# Patient Record
Sex: Male | Born: 1945 | Race: White | Hispanic: No | Marital: Married | Smoking: Former smoker
Health system: Southern US, Community
[De-identification: ages and names within clinical notes are randomized; demographics above are authoritative.]

## PROBLEM LIST (undated history)

## (undated) DIAGNOSIS — E039 Hypothyroidism, unspecified: Secondary | ICD-10-CM

## (undated) DIAGNOSIS — G43909 Migraine, unspecified, not intractable, without status migrainosus: Secondary | ICD-10-CM

## (undated) DIAGNOSIS — K219 Gastro-esophageal reflux disease without esophagitis: Secondary | ICD-10-CM

## (undated) DIAGNOSIS — M75100 Unspecified rotator cuff tear or rupture of unspecified shoulder, not specified as traumatic: Secondary | ICD-10-CM

## (undated) DIAGNOSIS — N183 Chronic kidney disease, stage 3 unspecified: Secondary | ICD-10-CM

## (undated) DIAGNOSIS — I10 Essential (primary) hypertension: Secondary | ICD-10-CM

## (undated) DIAGNOSIS — M3500 Sicca syndrome, unspecified: Secondary | ICD-10-CM

## (undated) DIAGNOSIS — I2510 Atherosclerotic heart disease of native coronary artery without angina pectoris: Secondary | ICD-10-CM

## (undated) DIAGNOSIS — E785 Hyperlipidemia, unspecified: Secondary | ICD-10-CM

## (undated) DIAGNOSIS — Z9861 Coronary angioplasty status: Secondary | ICD-10-CM

## (undated) DIAGNOSIS — M1990 Unspecified osteoarthritis, unspecified site: Secondary | ICD-10-CM

## (undated) DIAGNOSIS — M329 Systemic lupus erythematosus, unspecified: Secondary | ICD-10-CM

## (undated) DIAGNOSIS — M109 Gout, unspecified: Secondary | ICD-10-CM

## (undated) HISTORY — DX: Migraine, unspecified, not intractable, without status migrainosus: G43.909

## (undated) HISTORY — DX: Hypothyroidism, unspecified: E03.9

## (undated) HISTORY — DX: Hyperlipidemia, unspecified: E78.5

## (undated) HISTORY — DX: Gout, unspecified: M10.9

## (undated) HISTORY — DX: Essential (primary) hypertension: I10

## (undated) HISTORY — PX: HERNIA REPAIR: SHX51

## (undated) HISTORY — DX: Chronic kidney disease, stage 3 unspecified: N18.30

## (undated) HISTORY — DX: Systemic lupus erythematosus, unspecified: M32.9

## (undated) HISTORY — DX: Gastro-esophageal reflux disease without esophagitis: K21.9

## (undated) HISTORY — DX: Sjogren syndrome, unspecified: M35.00

## (undated) HISTORY — DX: Unspecified rotator cuff tear or rupture of unspecified shoulder, not specified as traumatic: M75.100

## (undated) HISTORY — DX: Unspecified osteoarthritis, unspecified site: M19.90

---

## 1995-05-20 HISTORY — PX: THYROID SURGERY: SHX805

## 2014-02-27 ENCOUNTER — Other Ambulatory Visit: Payer: Self-pay | Admitting: Nephrology

## 2014-02-27 DIAGNOSIS — N183 Chronic kidney disease, stage 3 unspecified: Secondary | ICD-10-CM

## 2014-03-06 ENCOUNTER — Other Ambulatory Visit: Payer: Self-pay

## 2014-03-10 ENCOUNTER — Ambulatory Visit
Admission: RE | Admit: 2014-03-10 | Discharge: 2014-03-10 | Disposition: A | Discharge status: Home / Self Care | Payer: Commercial Managed Care - HMO | Source: Ambulatory Visit | Attending: Nephrology | Admitting: Nephrology

## 2014-03-10 DIAGNOSIS — N183 Chronic kidney disease, stage 3 unspecified: Secondary | ICD-10-CM

## 2015-05-20 HISTORY — PX: CATARACT EXTRACTION, BILATERAL: SHX1313

## 2015-05-25 DIAGNOSIS — Z0000 Encounter for general adult medical examination without abnormal findings: Secondary | ICD-10-CM | POA: Diagnosis not present

## 2015-06-06 DIAGNOSIS — L300 Nummular dermatitis: Secondary | ICD-10-CM | POA: Diagnosis not present

## 2015-06-06 DIAGNOSIS — L820 Inflamed seborrheic keratosis: Secondary | ICD-10-CM | POA: Diagnosis not present

## 2015-06-27 DIAGNOSIS — Z79899 Other long term (current) drug therapy: Secondary | ICD-10-CM | POA: Diagnosis not present

## 2015-06-29 DIAGNOSIS — M3509 Sicca syndrome with other organ involvement: Secondary | ICD-10-CM | POA: Diagnosis not present

## 2015-06-29 DIAGNOSIS — M1A00X0 Idiopathic chronic gout, unspecified site, without tophus (tophi): Secondary | ICD-10-CM | POA: Diagnosis not present

## 2015-06-29 DIAGNOSIS — L932 Other local lupus erythematosus: Secondary | ICD-10-CM | POA: Diagnosis not present

## 2015-06-29 DIAGNOSIS — Z09 Encounter for follow-up examination after completed treatment for conditions other than malignant neoplasm: Secondary | ICD-10-CM | POA: Diagnosis not present

## 2015-07-06 DIAGNOSIS — N183 Chronic kidney disease, stage 3 (moderate): Secondary | ICD-10-CM | POA: Diagnosis not present

## 2015-07-13 DIAGNOSIS — I10 Essential (primary) hypertension: Secondary | ICD-10-CM | POA: Diagnosis not present

## 2015-07-13 DIAGNOSIS — M3500 Sicca syndrome, unspecified: Secondary | ICD-10-CM | POA: Diagnosis not present

## 2015-07-13 DIAGNOSIS — N183 Chronic kidney disease, stage 3 (moderate): Secondary | ICD-10-CM | POA: Diagnosis not present

## 2015-08-03 DIAGNOSIS — D3132 Benign neoplasm of left choroid: Secondary | ICD-10-CM | POA: Diagnosis not present

## 2015-08-03 DIAGNOSIS — Z79899 Other long term (current) drug therapy: Secondary | ICD-10-CM | POA: Diagnosis not present

## 2015-08-03 DIAGNOSIS — L930 Discoid lupus erythematosus: Secondary | ICD-10-CM | POA: Diagnosis not present

## 2015-08-03 DIAGNOSIS — H259 Unspecified age-related cataract: Secondary | ICD-10-CM | POA: Diagnosis not present

## 2015-09-14 DIAGNOSIS — Z79899 Other long term (current) drug therapy: Secondary | ICD-10-CM | POA: Diagnosis not present

## 2015-09-21 DIAGNOSIS — L300 Nummular dermatitis: Secondary | ICD-10-CM | POA: Diagnosis not present

## 2015-09-21 DIAGNOSIS — L820 Inflamed seborrheic keratosis: Secondary | ICD-10-CM | POA: Diagnosis not present

## 2015-09-21 DIAGNOSIS — D485 Neoplasm of uncertain behavior of skin: Secondary | ICD-10-CM | POA: Diagnosis not present

## 2015-11-16 DIAGNOSIS — R300 Dysuria: Secondary | ICD-10-CM | POA: Diagnosis not present

## 2015-11-16 DIAGNOSIS — Z79899 Other long term (current) drug therapy: Secondary | ICD-10-CM | POA: Diagnosis not present

## 2015-11-16 DIAGNOSIS — R5381 Other malaise: Secondary | ICD-10-CM | POA: Diagnosis not present

## 2015-11-19 DIAGNOSIS — L570 Actinic keratosis: Secondary | ICD-10-CM | POA: Diagnosis not present

## 2015-11-22 DIAGNOSIS — M5136 Other intervertebral disc degeneration, lumbar region: Secondary | ICD-10-CM | POA: Diagnosis not present

## 2015-11-23 DIAGNOSIS — M545 Low back pain: Secondary | ICD-10-CM | POA: Diagnosis not present

## 2015-11-23 DIAGNOSIS — L932 Other local lupus erythematosus: Secondary | ICD-10-CM | POA: Diagnosis not present

## 2015-11-23 DIAGNOSIS — M1A00X0 Idiopathic chronic gout, unspecified site, without tophus (tophi): Secondary | ICD-10-CM | POA: Diagnosis not present

## 2015-11-23 DIAGNOSIS — M3509 Sicca syndrome with other organ involvement: Secondary | ICD-10-CM | POA: Diagnosis not present

## 2015-12-28 DIAGNOSIS — H2513 Age-related nuclear cataract, bilateral: Secondary | ICD-10-CM | POA: Diagnosis not present

## 2015-12-28 DIAGNOSIS — H2511 Age-related nuclear cataract, right eye: Secondary | ICD-10-CM | POA: Diagnosis not present

## 2015-12-28 DIAGNOSIS — H1859 Other hereditary corneal dystrophies: Secondary | ICD-10-CM | POA: Diagnosis not present

## 2015-12-28 DIAGNOSIS — H18413 Arcus senilis, bilateral: Secondary | ICD-10-CM | POA: Diagnosis not present

## 2016-02-01 DIAGNOSIS — H2512 Age-related nuclear cataract, left eye: Secondary | ICD-10-CM | POA: Diagnosis not present

## 2016-02-01 DIAGNOSIS — H2511 Age-related nuclear cataract, right eye: Secondary | ICD-10-CM | POA: Diagnosis not present

## 2016-02-22 DIAGNOSIS — H2512 Age-related nuclear cataract, left eye: Secondary | ICD-10-CM | POA: Diagnosis not present

## 2016-03-14 DIAGNOSIS — I129 Hypertensive chronic kidney disease with stage 1 through stage 4 chronic kidney disease, or unspecified chronic kidney disease: Secondary | ICD-10-CM | POA: Diagnosis not present

## 2016-03-14 DIAGNOSIS — E291 Testicular hypofunction: Secondary | ICD-10-CM | POA: Diagnosis not present

## 2016-03-14 DIAGNOSIS — E782 Mixed hyperlipidemia: Secondary | ICD-10-CM | POA: Diagnosis not present

## 2016-03-14 DIAGNOSIS — N183 Chronic kidney disease, stage 3 (moderate): Secondary | ICD-10-CM | POA: Diagnosis not present

## 2016-03-14 DIAGNOSIS — M3500 Sicca syndrome, unspecified: Secondary | ICD-10-CM | POA: Diagnosis not present

## 2016-03-14 DIAGNOSIS — Z0000 Encounter for general adult medical examination without abnormal findings: Secondary | ICD-10-CM | POA: Diagnosis not present

## 2016-03-14 DIAGNOSIS — I10 Essential (primary) hypertension: Secondary | ICD-10-CM | POA: Diagnosis not present

## 2016-03-14 DIAGNOSIS — Z23 Encounter for immunization: Secondary | ICD-10-CM | POA: Diagnosis not present

## 2016-03-14 DIAGNOSIS — M109 Gout, unspecified: Secondary | ICD-10-CM | POA: Diagnosis not present

## 2016-03-14 DIAGNOSIS — Z87448 Personal history of other diseases of urinary system: Secondary | ICD-10-CM | POA: Diagnosis not present

## 2016-03-14 LAB — BASIC METABOLIC PANEL
BUN: 25 — AB (ref 4–21)
CREATININE: 1.4 — AB (ref 0.6–1.3)
Glucose: 79
Potassium: 4.5 (ref 3.4–5.3)
SODIUM: 142 (ref 137–147)

## 2016-03-14 LAB — HEPATIC FUNCTION PANEL
ALT: 20 (ref 10–40)
AST: 26 (ref 14–40)
Alkaline Phosphatase: 75 (ref 25–125)
BILIRUBIN, TOTAL: 0.3

## 2016-03-14 LAB — LIPID PANEL
Cholesterol: 208 — AB (ref 0–200)
HDL: 47 (ref 35–70)
LDL Cholesterol: 138
TRIGLYCERIDES: 143 (ref 40–160)

## 2016-03-14 LAB — TSH: TSH: 3.82 (ref 0.41–5.90)

## 2016-03-14 LAB — PSA: PSA: 2.52

## 2016-03-28 DIAGNOSIS — Z0000 Encounter for general adult medical examination without abnormal findings: Secondary | ICD-10-CM | POA: Diagnosis not present

## 2016-03-28 DIAGNOSIS — M109 Gout, unspecified: Secondary | ICD-10-CM | POA: Diagnosis not present

## 2016-03-28 DIAGNOSIS — E291 Testicular hypofunction: Secondary | ICD-10-CM | POA: Diagnosis not present

## 2016-03-28 DIAGNOSIS — I10 Essential (primary) hypertension: Secondary | ICD-10-CM | POA: Diagnosis not present

## 2016-04-09 ENCOUNTER — Other Ambulatory Visit: Payer: Self-pay | Admitting: Rheumatology

## 2016-04-09 NOTE — Telephone Encounter (Signed)
11/23/15 last visit  11/16/15 labs WNL Next visit 04/25/16 Ok to refill per Dr Estanislado Pandy

## 2016-04-21 ENCOUNTER — Other Ambulatory Visit: Payer: Self-pay | Admitting: *Deleted

## 2016-04-21 ENCOUNTER — Telehealth: Payer: Self-pay | Admitting: Rheumatology

## 2016-04-21 DIAGNOSIS — Z79899 Other long term (current) drug therapy: Secondary | ICD-10-CM

## 2016-04-22 DIAGNOSIS — Z79899 Other long term (current) drug therapy: Secondary | ICD-10-CM | POA: Diagnosis not present

## 2016-04-22 NOTE — Telephone Encounter (Signed)
Opened in error

## 2016-04-23 DIAGNOSIS — M5136 Other intervertebral disc degeneration, lumbar region: Secondary | ICD-10-CM | POA: Insufficient documentation

## 2016-04-23 DIAGNOSIS — N289 Disorder of kidney and ureter, unspecified: Secondary | ICD-10-CM | POA: Insufficient documentation

## 2016-04-23 DIAGNOSIS — M19042 Primary osteoarthritis, left hand: Secondary | ICD-10-CM

## 2016-04-23 DIAGNOSIS — IMO0002 Reserved for concepts with insufficient information to code with codable children: Secondary | ICD-10-CM | POA: Insufficient documentation

## 2016-04-23 DIAGNOSIS — M19041 Primary osteoarthritis, right hand: Secondary | ICD-10-CM | POA: Insufficient documentation

## 2016-04-23 DIAGNOSIS — M3500 Sicca syndrome, unspecified: Secondary | ICD-10-CM | POA: Insufficient documentation

## 2016-04-23 DIAGNOSIS — M329 Systemic lupus erythematosus, unspecified: Secondary | ICD-10-CM | POA: Insufficient documentation

## 2016-04-23 DIAGNOSIS — M1A00X0 Idiopathic chronic gout, unspecified site, without tophus (tophi): Secondary | ICD-10-CM | POA: Insufficient documentation

## 2016-04-23 DIAGNOSIS — M51369 Other intervertebral disc degeneration, lumbar region without mention of lumbar back pain or lower extremity pain: Secondary | ICD-10-CM | POA: Insufficient documentation

## 2016-04-23 DIAGNOSIS — Z79899 Other long term (current) drug therapy: Secondary | ICD-10-CM | POA: Insufficient documentation

## 2016-04-23 LAB — COMPREHENSIVE METABOLIC PANEL
ALBUMIN: 4.6 g/dL (ref 3.5–4.8)
ALK PHOS: 81 IU/L (ref 39–117)
ALT: 29 IU/L (ref 0–44)
AST: 27 IU/L (ref 0–40)
Albumin/Globulin Ratio: 1.4 (ref 1.2–2.2)
BUN / CREAT RATIO: 16 (ref 10–24)
BUN: 23 mg/dL (ref 8–27)
Bilirubin Total: 0.2 mg/dL (ref 0.0–1.2)
CALCIUM: 9.1 mg/dL (ref 8.6–10.2)
CO2: 29 mmol/L (ref 18–29)
CREATININE: 1.45 mg/dL — AB (ref 0.76–1.27)
Chloride: 98 mmol/L (ref 96–106)
GFR calc Af Amer: 56 mL/min/{1.73_m2} — ABNORMAL LOW (ref >59)
GFR, EST NON AFRICAN AMERICAN: 48 mL/min/{1.73_m2} — AB (ref >59)
GLOBULIN, TOTAL: 3.4 g/dL (ref 1.5–4.5)
GLUCOSE: 86 mg/dL (ref 65–99)
Potassium: 5 mmol/L (ref 3.5–5.2)
SODIUM: 143 mmol/L (ref 134–144)
Total Protein: 8 g/dL (ref 6.0–8.5)

## 2016-04-23 LAB — CBC WITH DIFFERENTIAL/PLATELET
BASOS ABS: 0 10*3/uL (ref 0.0–0.2)
Basos: 1 %
EOS (ABSOLUTE): 0.2 10*3/uL (ref 0.0–0.4)
EOS: 3 %
HEMATOCRIT: 47.5 % (ref 37.5–51.0)
HEMOGLOBIN: 16.2 g/dL (ref 13.0–17.7)
IMMATURE GRANS (ABS): 0 10*3/uL (ref 0.0–0.1)
IMMATURE GRANULOCYTES: 0 %
LYMPHS ABS: 1.5 10*3/uL (ref 0.7–3.1)
LYMPHS: 23 %
MCH: 31.2 pg (ref 26.6–33.0)
MCHC: 34.1 g/dL (ref 31.5–35.7)
MCV: 92 fL (ref 79–97)
MONOCYTES: 9 %
Monocytes Absolute: 0.6 10*3/uL (ref 0.1–0.9)
NEUTROS PCT: 64 %
Neutrophils Absolute: 4.2 10*3/uL (ref 1.4–7.0)
Platelets: 240 10*3/uL (ref 150–379)
RBC: 5.19 x10E6/uL (ref 4.14–5.80)
RDW: 14.8 % (ref 12.3–15.4)
WBC: 6.5 10*3/uL (ref 3.4–10.8)

## 2016-04-23 NOTE — Progress Notes (Signed)
Office Visit Note  Patient: Dennis Moon             Date of Birth: 1946/02/14           MRN: [id number]             PCP: Adline Mango, MD Referring: No ref. provider found Visit Date: 04/25/2016 Occupation: @GUAROCC @    Subjective:  No chief complaint on file. Follow-up on Sjogren's, cutaneous lupus, gout, chronic kidney disease, high risk prescription  History of Present Illness: Dennis Moon is a 70 y.o. male  Last seen in our office on 11/23/2015 Patient is doing well with everything. No Sjogren's flare. Has ongoing dry eyes and dry mouth with minimal discomfort.  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 He sees his dermatologist on a regular basis for this as well as general skin check.  Flares since the last visit. Takes allopurinol 30 mg one half tablet daily. His last uric acid was 4.6 back in 11/16/2015.  Sees Dr. Joelyn Oms chronic renal insufficiency. The last visit, patient was told that his kidney functions are stable.  Takes Plaquenil on a regular basis at 200 mg twice a day Monday through Friday. His Plaquenil eye exam was normal April 2017 and will be due again annually.  Needs refill on allopurinol and Plaquenil.  Take Cymbalta for joint pain. Prescribed by his PCP.  Recently had bilateral cataract surgery September 2017 is doing well with that.   Activities of Daily Living:  Patient reports morning stiffness for 15 minutes.   Patient Denies nocturnal pain.  Difficulty dressing/grooming: Denies Difficulty climbing stairs: Denies Difficulty getting out of chair: Denies Difficulty using hands for taps, buttons, cutlery, and/or writing: Reports   Review of Systems  Constitutional: Negative for fatigue.  HENT: Negative for mouth sores and mouth dryness.   Eyes: Negative for dryness.  Respiratory: Negative for shortness of breath.   Gastrointestinal: Negative for constipation and diarrhea.  Musculoskeletal: Negative for myalgias and myalgias.  Skin:  Negative for sensitivity to sunlight.  Neurological: Negative for memory loss.  Psychiatric/Behavioral: Negative for sleep disturbance.    PMFS History:  Patient Active Problem List   Diagnosis Date Noted  . Sjogren's syndrome ([place]) 04/23/2016  . 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 Idiopathic chronic gout, unspecified site, without tophus (tophi) 04/23/2016  . High risk medication use 04/23/2016  . Osteoarthritis, hand 04/23/2016  . DDD (degenerative disc disease), lumbar 04/23/2016  . Kidney disease 04/23/2016    Past Medical History:  Diagnosis Date  . Gout   . Hypertension   . Hypothyroidism   . Migraine   . Sjogren's disease ([place])   . Systemic lupus erythematosus (HCC)     Family History  Problem Relation Age of Onset  . Diabetes Mother   . Hypertension Mother   . Hypertension Father    Past Surgical History:  Procedure Laterality Date  . CATARACT EXTRACTION, BILATERAL Bilateral 2017  . HERNIA REPAIR    . THYROID SURGERY  1997   Social History   Social History Narrative  . No narrative on file     Objective: Vital Signs: BP (!) 149/77 (BP Location: Left Arm, Patient Position: Sitting, Cuff Size: Normal)   Pulse 71   Resp 14   Ht 5' 11.5" (1.816 m)   Wt 228 lb (103.4 kg)   BMI 31.36 kg/m    Physical Exam  Constitutional: He is oriented to person, place, and time. He appears well-developed and well-nourished.  HENT:  Head: Normocephalic and  atraumatic.  Eyes: Conjunctivae and EOM are normal. Pupils are equal, round, and reactive to light.  Neck: Normal range of motion. Neck supple.  Cardiovascular: Normal rate, regular rhythm and normal heart sounds.  Exam reveals no gallop and no friction rub.   No murmur heard. Pulmonary/Chest: Effort normal and breath sounds normal. No respiratory distress. He has no wheezes. He has no rales. He exhibits no tenderness.  Abdominal: Soft. He exhibits no distension and no mass. There is no tenderness. There is no guarding.    Musculoskeletal: Normal range of motion.  Lymphadenopathy:    He has no cervical adenopathy.  Neurological: He is alert and oriented to person, place, and time. He exhibits normal muscle tone. Coordination normal.  Skin: Skin is warm and dry. Capillary refill takes less than 2 seconds. No rash noted.  Psychiatric: He has a normal mood and affect. His behavior is normal. Judgment and thought content normal.  Nursing note and vitals reviewed.    Musculoskeletal Exam:  Full range of motion of all joints Grip strength is equal and strong bilaterally For myalgia tender points are all absent  CDAI Exam: No CDAI exam completed.   No synovitis on examination Investigation: Findings:  09/14/2015 normal plaquenil eye exam 11/16/2015 CBC CMP consistent with previous labs Creat 1.36 GFR 52  Patient recently had labs done on 04/23/2016. CMP with GFR is normal except for creatinine at 1.45 and GFR 48. These were done at [facility]. Please find those in Epic. CBC with differential is normal. Will return to clinic in 18 weeks for follow-up visit and we will draw labs at that time which will include CBC with differential CMP with GFR uric acid and urinalysis. Patient understands and is agreeable.  Imaging: No results found.  Speciality Comments: No specialty comments available.    Procedures:  No procedures performed Allergies: Patient has no known allergies.   Assessment / Plan:     Visit Diagnoses: Chronic kidney disease, unspecified CKD stage - Plan: COMPLETE METABOLIC PANEL WITH GFR  Sjogren's syndrome with keratoconjunctivitis sicca (HCC) - Positive ANA; positive Ro; positive La;  Subacute cutaneous lupus erythematosus  Idiopathic chronic gout of multiple sites without tophus - allopurionol - Plan: Uric acid  High risk medication use - PLQ 200 BID M-F; PLQ EYE NORMAL 08/2015 ;  - Plan: CBC with Differential/Platelet, COMPLETE METABOLIC PANEL WITH GFR, Uric acid, Urinalysis, Routine  w reflex microscopic  Primary osteoarthritis of both hands  DDD (degenerative disc disease), lumbar  Kidney disease - Plan: COMPLETE METABOLIC PANEL WITH GFR   Chronic kidney disease   Orders: Orders Placed This Encounter  Procedures  . CBC with Differential/Platelet  . COMPLETE METABOLIC PANEL WITH GFR  . Uric acid  . Urinalysis, Routine w reflex microscopic   Meds ordered this encounter  Medications  . hydroxychloroquine (PLAQUENIL) 200 MG tablet    Sig: Take 1 tablet (200 mg total) by mouth 2 (two) times daily.    Dispense:  180 tablet    Refill:  1    Order Specific Question:   Supervising Provider    Answer:   Bo Merino [[id number]]  . allopurinol (ZYLOPRIM) 300 MG tablet    Sig: Take 1.5 tablets (450 mg total) by mouth daily.    Dispense:  135 tablet    Refill:  1    Order Specific Question:   Supervising Provider    Answer:   Bo Merino [phone]    Face-to-face time spent with patient was 30  minutes. 50% of time was spent in counseling and coordination of care.  Follow-Up Instructions: Return in about 18 weeks (around 08/29/2016) for sjog, cutan lupus, ckd - stable, plq 200 bid m-f;.   Malayah Demuro, PA-C   I examined and evaluated the patient with Eliezer Lofts PA. The plan of care was discussed as noted above.  Bo Merino, MD

## 2016-04-23 NOTE — Progress Notes (Signed)
Labs are stable, GFR slightly decreased. Please fax results to PCP

## 2016-04-25 ENCOUNTER — Ambulatory Visit (INDEPENDENT_AMBULATORY_CARE_PROVIDER_SITE_OTHER): Payer: PPO | Admitting: Rheumatology

## 2016-04-25 ENCOUNTER — Encounter: Payer: Self-pay | Admitting: Rheumatology

## 2016-04-25 ENCOUNTER — Ambulatory Visit: Payer: Self-pay | Admitting: Rheumatology

## 2016-04-25 VITALS — BP 149/77 | HR 71 | Resp 14 | Ht 71.5 in | Wt 228.0 lb

## 2016-04-25 DIAGNOSIS — L931 Subacute cutaneous lupus erythematosus: Secondary | ICD-10-CM

## 2016-04-25 DIAGNOSIS — N289 Disorder of kidney and ureter, unspecified: Secondary | ICD-10-CM

## 2016-04-25 DIAGNOSIS — M5136 Other intervertebral disc degeneration, lumbar region: Secondary | ICD-10-CM | POA: Diagnosis not present

## 2016-04-25 DIAGNOSIS — M3501 Sicca syndrome with keratoconjunctivitis: Secondary | ICD-10-CM

## 2016-04-25 DIAGNOSIS — N189 Chronic kidney disease, unspecified: Secondary | ICD-10-CM

## 2016-04-25 DIAGNOSIS — Z79899 Other long term (current) drug therapy: Secondary | ICD-10-CM | POA: Diagnosis not present

## 2016-04-25 DIAGNOSIS — M19042 Primary osteoarthritis, left hand: Secondary | ICD-10-CM | POA: Diagnosis not present

## 2016-04-25 DIAGNOSIS — M51369 Other intervertebral disc degeneration, lumbar region without mention of lumbar back pain or lower extremity pain: Secondary | ICD-10-CM

## 2016-04-25 DIAGNOSIS — M1A09X0 Idiopathic chronic gout, multiple sites, without tophus (tophi): Secondary | ICD-10-CM

## 2016-04-25 DIAGNOSIS — M19041 Primary osteoarthritis, right hand: Secondary | ICD-10-CM | POA: Diagnosis not present

## 2016-04-25 MED ORDER — ALLOPURINOL 300 MG PO TABS: 450.0000 mg | ORAL_TABLET | Freq: Every day | ORAL | 1 refills | Status: DC

## 2016-04-25 MED ORDER — HYDROXYCHLOROQUINE SULFATE 200 MG PO TABS: 200.0000 mg | ORAL_TABLET | Freq: Two times a day (BID) | ORAL | 1 refills | Status: DC

## 2016-06-20 DIAGNOSIS — N183 Chronic kidney disease, stage 3 (moderate): Secondary | ICD-10-CM | POA: Diagnosis not present

## 2016-06-27 DIAGNOSIS — I10 Essential (primary) hypertension: Secondary | ICD-10-CM | POA: Diagnosis not present

## 2016-06-27 DIAGNOSIS — N183 Chronic kidney disease, stage 3 (moderate): Secondary | ICD-10-CM | POA: Diagnosis not present

## 2016-06-27 DIAGNOSIS — Z6832 Body mass index (BMI) 32.0-32.9, adult: Secondary | ICD-10-CM | POA: Diagnosis not present

## 2016-08-26 DIAGNOSIS — N189 Chronic kidney disease, unspecified: Secondary | ICD-10-CM | POA: Insufficient documentation

## 2016-08-26 DIAGNOSIS — M109 Gout, unspecified: Secondary | ICD-10-CM | POA: Insufficient documentation

## 2016-08-26 NOTE — Progress Notes (Deleted)
Office Visit Note  Patient: Dennis Moon             Date of Birth: 06/02/1945           MRN: [id number]             PCP: Adline Mango, MD Referring: Karleen Hampshire., MD Visit Date: 08/29/2016 Occupation: @GUAROCC @    Subjective:  No chief complaint on file.   History of Present Illness: Dennis Moon is a 71 y.o. male ***   Activities of Daily Living:  Patient reports morning stiffness for *** {minute/hour:19697}.   Patient {ACTIONS;DENIES/REPORTS:21021675::"Denies"} nocturnal pain.  Difficulty dressing/grooming: {ACTIONS;DENIES/REPORTS:21021675::"Denies"} Difficulty climbing stairs: {ACTIONS;DENIES/REPORTS:21021675::"Denies"} Difficulty getting out of chair: {ACTIONS;DENIES/REPORTS:21021675::"Denies"} Difficulty using hands for taps, buttons, cutlery, and/or writing: {ACTIONS;DENIES/REPORTS:21021675::"Denies"}   No Rheumatology ROS completed.   PMFS History:  Patient Active Problem List   Diagnosis Date Noted  . Sjogren's syndrome ([place]) 04/23/2016  . 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 Idiopathic chronic gout, unspecified site, without tophus (tophi) 04/23/2016  . High risk medication use 04/23/2016  . Osteoarthritis, hand 04/23/2016  . DDD (degenerative disc disease), lumbar 04/23/2016  . Kidney disease 04/23/2016    Past Medical History:  Diagnosis Date  . Gout   . Hypertension   . Hypothyroidism   . Migraine   . Sjogren's disease ([place])   . Systemic lupus erythematosus (HCC)     Family History  Problem Relation Age of Onset  . Diabetes Mother   . Hypertension Mother   . Hypertension Father    Past Surgical History:  Procedure Laterality Date  . CATARACT EXTRACTION, BILATERAL Bilateral 2017  . HERNIA REPAIR    . THYROID SURGERY  1997   Social History   Social History Narrative  . No narrative on file     Objective: Vital Signs: There were no vitals taken for this visit.   Physical Exam   Musculoskeletal Exam: ***  CDAI Exam: No CDAI exam  completed.    Investigation: Findings:  09/14/2015 normal plaquenil eye exam 11/16/2015 CBC CMP consistent with previous labs Creat 1.36 GFR 52  Patient recently had labs done on 04/23/2016. CMP with GFR is normal except for creatinine at 1.45 and GFR 48. These were done at [facility]. Please find those in Epic. CBC with differential is normal. Will return to clinic in 18 weeks for follow-up visit and we will draw labs at that time which will include CBC with differential CMP with GFR uric acid and urinalysis.  Lab on 04/21/2016  Component Date Value Ref Range Status  . WBC 04/22/2016 6.5  3.4 - 10.8 x10E3/uL Final  . RBC 04/22/2016 5.19  4.14 - 5.80 x10E6/uL Final  . Hemoglobin 04/22/2016 16.2  13.0 - 17.7 g/dL Final  . Hematocrit 04/22/2016 47.5  37.5 - 51.0 % Final  . MCV 04/22/2016 92  79 - 97 fL Final  . MCH 04/22/2016 31.2  26.6 - 33.0 pg Final  . MCHC 04/22/2016 34.1  31.5 - 35.7 g/dL Final  . RDW 04/22/2016 14.8  12.3 - 15.4 % Final  . Platelets 04/22/2016 240  150 - 379 x10E3/uL Final  . Neutrophils 04/22/2016 64  Not Estab. % Final  . Lymphs 04/22/2016 23  Not Estab. % Final  . Monocytes 04/22/2016 9  Not Estab. % Final  . Eos 04/22/2016 3  Not Estab. % Final  . Basos 04/22/2016 1  Not Estab. % Final  . Neutrophils Absolute 04/22/2016 4.2  1.4 - 7.0 x10E3/uL Final  . Lymphocytes  Absolute 04/22/2016 1.5  0.7 - 3.1 x10E3/uL Final  . Monocytes Absolute 04/22/2016 0.6  0.1 - 0.9 x10E3/uL Final  . EOS (ABSOLUTE) 04/22/2016 0.2  0.0 - 0.4 x10E3/uL Final  . Basophils Absolute 04/22/2016 0.0  0.0 - 0.2 x10E3/uL Final  . Immature Granulocytes 04/22/2016 0  Not Estab. % Final  . Immature Grans (Abs) 04/22/2016 0.0  0.0 - 0.1 x10E3/uL Final  . Glucose 04/22/2016 86  65 - 99 mg/dL Final  . BUN 04/22/2016 23  8 - 27 mg/dL Final  . Creatinine, Ser 04/22/2016 1.45* 0.76 - 1.27 mg/dL Final  . GFR calc non Af Amer 04/22/2016 48* >59 mL/min/1.73 Final  . GFR calc Af Amer 04/22/2016 56*  >59 mL/min/1.73 Final  . BUN/Creatinine Ratio 04/22/2016 16  10 - 24 Final  . Sodium 04/22/2016 143  134 - 144 mmol/L Final  . Potassium 04/22/2016 5.0  3.5 - 5.2 mmol/L Final  . Chloride 04/22/2016 98  96 - 106 mmol/L Final  . CO2 04/22/2016 29  18 - 29 mmol/L Final  . Calcium 04/22/2016 9.1  8.6 - 10.2 mg/dL Final  . Total Protein 04/22/2016 8.0  6.0 - 8.5 g/dL Final  . Albumin 04/22/2016 4.6  3.5 - 4.8 g/dL Final  . Globulin, Total 04/22/2016 3.4  1.5 - 4.5 g/dL Final  . Albumin/Globulin Ratio 04/22/2016 1.4  1.2 - 2.2 Final  . Bilirubin Total 04/22/2016 0.2  0.0 - 1.2 mg/dL Final  . Alkaline Phosphatase 04/22/2016 81  39 - 117 IU/L Final  . AST 04/22/2016 27  0 - 40 IU/L Final  . ALT 04/22/2016 29  0 - 44 IU/L Final      Imaging: No results found.  Speciality Comments: No specialty comments available.    Procedures:  No procedures performed Allergies: Patient has no known allergies.   Assessment / Plan:     Visit Diagnoses: No diagnosis found.    Orders: No orders of the defined types were placed in this encounter.  No orders of the defined types were placed in this encounter.   Face-to-face time spent with patient was *** minutes. 50% of time was spent in counseling and coordination of care.  Follow-Up Instructions: No Follow-up on file.   Sunaina Ferrando, [place]  Note - This record has been created using [employer].  Chart creation errors have been sought, but may not always  have been located. Such creation errors do not reflect on  the standard of medical care.

## 2016-08-29 ENCOUNTER — Ambulatory Visit: Payer: PPO | Admitting: Rheumatology

## 2016-09-30 ENCOUNTER — Telehealth: Payer: Self-pay | Admitting: Rheumatology

## 2016-09-30 DIAGNOSIS — M2550 Pain in unspecified joint: Secondary | ICD-10-CM

## 2016-09-30 DIAGNOSIS — Z79899 Other long term (current) drug therapy: Secondary | ICD-10-CM

## 2016-09-30 DIAGNOSIS — R5381 Other malaise: Secondary | ICD-10-CM

## 2016-09-30 DIAGNOSIS — Z1159 Encounter for screening for other viral diseases: Secondary | ICD-10-CM

## 2016-09-30 DIAGNOSIS — R300 Dysuria: Secondary | ICD-10-CM

## 2016-09-30 NOTE — Telephone Encounter (Signed)
Patient is due for labd. Patient is also requesting that Hep C and testosterone levels be added to the lab order. Can we add these labs to his order?

## 2016-09-30 NOTE — Telephone Encounter (Signed)
ok 

## 2016-09-30 NOTE — Telephone Encounter (Signed)
Patient's wife is requesting lab orders be released for [facility].  Patient is also requesting that Hep C and testosterone levels be added to the lab order.   Please call patient's wife and let her know if this can be done. Cb# [phone]

## 2016-10-01 NOTE — Telephone Encounter (Signed)
Orders placed and faxed to the [facility] in [place]. Patient's wife advised we would be adding labs.

## 2016-10-02 NOTE — Progress Notes (Signed)
Office Visit Note  Patient: Dennis Moon             Date of Birth: 04-25-1946           MRN: [id number]             PCP: Karleen Hampshire., MD Referring: Karleen Hampshire., MD Visit Date: 10/10/2016 Occupation: @GUAROCC @    Subjective:  Medication Management   History of Present Illness: Dennis Moon is a 71 y.o. male with history of gout and Sjogren's. He states he has not had gout flare in a long time. He has not had recurrence of the rash from subcutaneous lupus. His sicca symptoms are tolerable with over-the-counter medications. She's been taking Plaquenil without any problems. He has some stiffness in his hands which is tolerable. Lower back pain is not a concern currently.   Activities of Daily Living:  Patient reports morning stiffness for 5 minutes.   Patient Denies nocturnal pain.  Difficulty dressing/grooming: Denies Difficulty climbing stairs: Denies Difficulty getting out of chair: Denies Difficulty using hands for taps, buttons, cutlery, and/or writing: Denies   Review of Systems  Constitutional: Negative for fatigue, night sweats and weakness ( ).  HENT: Positive for mouth dryness. Negative for mouth sores and nose dryness.   Eyes: Positive for dryness. Negative for redness.  Respiratory: Negative for shortness of breath and difficulty breathing.   Cardiovascular: Negative for chest pain, palpitations, hypertension, irregular heartbeat and swelling in legs/feet.  Gastrointestinal: Negative for constipation and diarrhea.  Endocrine: Negative for increased urination.  Musculoskeletal: Positive for arthralgias, joint pain and morning stiffness. Negative for joint swelling, myalgias, muscle weakness, muscle tenderness and myalgias.  Skin: Positive for color change. Negative for rash, hair loss, nodules/bumps, skin tightness, ulcers and sensitivity to sunlight.  Allergic/Immunologic: Negative for susceptible to infections.  Neurological: Negative for dizziness, fainting,  memory loss and night sweats.  Hematological: Negative for swollen glands.  Psychiatric/Behavioral: Negative for depressed mood and sleep disturbance. The patient is not nervous/anxious.     PMFS History:  Patient Active Problem List   Diagnosis Date Noted  . Gout of multiple sites 08/26/2016  . Chronic renal impairment 08/26/2016  . Chronic kidney disease 08/26/2016  . Sjogren's syndrome ([place]) 04/23/2016  . 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 Idiopathic chronic gout, unspecified site, without tophus (tophi) 04/23/2016  . High risk medication use 04/23/2016  . Primary osteoarthritis of both hands 04/23/2016  . DDD (degenerative disc disease), lumbar 04/23/2016  . Kidney disease 04/23/2016    Past Medical History:  Diagnosis Date  . Gout   . Hypertension   . Hypothyroidism   . Migraine   . Sjogren's disease ([place])   . Systemic lupus erythematosus (HCC)     Family History  Problem Relation Age of Onset  . Diabetes Mother   . Hypertension Mother   . Hypertension Father    Past Surgical History:  Procedure Laterality Date  . CATARACT EXTRACTION, BILATERAL Bilateral 2017  . HERNIA REPAIR    . THYROID SURGERY  1997   Social History   Social History Narrative  . No narrative on file     Objective: Vital Signs: BP 134/78   Pulse 78   Resp 14   Ht 5' 11.5" (1.816 m)   Wt 236 lb (107 kg)   BMI 32.46 kg/m    Physical Exam  Constitutional: He is oriented to person, place, and time. He appears well-developed and well-nourished.  HENT:  Head: Normocephalic and atraumatic.  Eyes: Conjunctivae and EOM are normal. Pupils are equal, round, and reactive to light.  Neck: Normal range of motion. Neck supple.  Cardiovascular: Normal rate, regular rhythm and normal heart sounds.   Pulmonary/Chest: Effort normal and breath sounds normal.  Abdominal: Soft. Bowel sounds are normal.  Neurological: He is alert and oriented to person, place, and time.  Skin: Skin is warm and dry.  Capillary refill takes less than 2 seconds.  Psychiatric: He has a normal mood and affect. His behavior is normal.  Nursing note and vitals reviewed.    Musculoskeletal Exam: C-spine and thoracic lumbar spine good range of motion. Shoulder joints although joints wrist joints are good range of motion. He has some DIP PIP thickening in his hands consistent with osteoarthritis wrist joints full range of motion with no synovitis.  CDAI Exam: No CDAI exam completed.    Investigation: No additional findings. CBC Latest Ref Rng & Units 10/03/2016 04/22/2016  WBC 3.4 - 10.8 x10E3/uL 6.1 6.5  Hematocrit 37.5 - 51.0 % 47.7 47.5  Platelets 150 - 379 x10E3/uL 240 240   CMP Latest Ref Rng & Units 10/03/2016 04/22/2016  Glucose 65 - 99 mg/dL 74 86  BUN 8 - 27 mg/dL 24 23  Creatinine 0.76 - 1.27 mg/dL 1.31(H) 1.45(H)  Sodium 134 - 144 mmol/L 140 143  Potassium 3.5 - 5.2 mmol/L 4.8 5.0  Chloride 96 - 106 mmol/L 96 98  CO2 18 - 29 mmol/L 30(H) 29  Calcium 8.6 - 10.2 mg/dL 9.4 9.1  Total Protein 6.0 - 8.5 g/dL 7.7 8.0  Total Bilirubin 0.0 - 1.2 mg/dL 0.2 0.2  Alkaline Phos 39 - 117 IU/L 75 81  AST 0 - 40 IU/L 24 27  ALT 0 - 44 IU/L 21 29   Uric acid 3.8 Imaging: No results found.  Speciality Comments: No specialty comments available.    Procedures:  No procedures performed Allergies: Bee venom   Assessment / Plan:     Visit Diagnoses: Idiopathic chronic gout of multiple sites without tophus - On allopurinol 450 mg by mouth daily - Patient has not had a gout attack in a long time and his uric acid isn't desirable range. Plan: Uric acid  Subacute cutaneous lupus erythematosus: He has not had any rash recently. Have advised him to use sunscreen on a regular basis.  Sjogren's syndrome with keratoconjunctivitis sicca ([place]), over-the-counter medications has been helpful. - Positive ANA, positive Ro, positive La - Plan: ANA, C3 and C4, Rheumatoid factor, Serum protein electrophoresis with  reflex  High risk medication use - Plaquenil 200 mg by mouth twice a day. He has been tolerating Plaquenil well. His eye exams have been normal.- Plan: CBC with Differential/Platelet, COMPLETE METABOLIC PANEL WITH GFR  Primary osteoarthritis of both hands: Some stiffness  DDD (degenerative disc disease), lumbar: He does have some chronic lower back pain  History of chronic kidney disease : Renal functions have been stable and he's been followed by nephrologist. Orders: Orders Placed This Encounter  Procedures  . CBC with Differential/Platelet  . COMPLETE METABOLIC PANEL WITH GFR  . Uric acid  . ANA  . C3 and C4  . Rheumatoid factor  . Serum protein electrophoresis with reflex   No orders of the defined types were placed in this encounter.   Face-to-face time spent with patient was 25 minutes. 50% of time was spent in counseling and coordination of care.  Follow-Up Instructions: Return in about 5 months (around 03/12/2017) for Gout, Sjogren's.  Bo Merino, MD  Note - This record has been created using [employer].  Chart creation errors have been sought, but may not always  have been located. Such creation errors do not reflect on  the standard of medical care.

## 2016-10-03 DIAGNOSIS — Z79899 Other long term (current) drug therapy: Secondary | ICD-10-CM | POA: Diagnosis not present

## 2016-10-03 DIAGNOSIS — R300 Dysuria: Secondary | ICD-10-CM | POA: Diagnosis not present

## 2016-10-03 DIAGNOSIS — R5381 Other malaise: Secondary | ICD-10-CM | POA: Diagnosis not present

## 2016-10-03 DIAGNOSIS — Z1159 Encounter for screening for other viral diseases: Secondary | ICD-10-CM | POA: Diagnosis not present

## 2016-10-03 DIAGNOSIS — M2550 Pain in unspecified joint: Secondary | ICD-10-CM | POA: Diagnosis not present

## 2016-10-04 LAB — CBC WITH DIFFERENTIAL/PLATELET
BASOS: 0 %
Basophils Absolute: 0 10*3/uL (ref 0.0–0.2)
EOS (ABSOLUTE): 0.2 10*3/uL (ref 0.0–0.4)
Eos: 4 %
Hematocrit: 47.7 % (ref 37.5–51.0)
Hemoglobin: 16.1 g/dL (ref 13.0–17.7)
IMMATURE GRANS (ABS): 0 10*3/uL (ref 0.0–0.1)
Immature Granulocytes: 0 %
LYMPHS ABS: 1.5 10*3/uL (ref 0.7–3.1)
LYMPHS: 24 %
MCH: 30.9 pg (ref 26.6–33.0)
MCHC: 33.8 g/dL (ref 31.5–35.7)
MCV: 92 fL (ref 79–97)
Monocytes Absolute: 0.6 10*3/uL (ref 0.1–0.9)
Monocytes: 10 %
NEUTROS ABS: 3.7 10*3/uL (ref 1.4–7.0)
Neutrophils: 62 %
PLATELETS: 240 10*3/uL (ref 150–379)
RBC: 5.21 x10E6/uL (ref 4.14–5.80)
RDW: 15 % (ref 12.3–15.4)
WBC: 6.1 10*3/uL (ref 3.4–10.8)

## 2016-10-04 LAB — CMP14+EGFR
A/G RATIO: 1.3 (ref 1.2–2.2)
ALT: 21 IU/L (ref 0–44)
AST: 24 IU/L (ref 0–40)
Albumin: 4.3 g/dL (ref 3.5–4.8)
Alkaline Phosphatase: 75 IU/L (ref 39–117)
BILIRUBIN TOTAL: 0.2 mg/dL (ref 0.0–1.2)
BUN / CREAT RATIO: 18 (ref 10–24)
BUN: 24 mg/dL (ref 8–27)
CALCIUM: 9.4 mg/dL (ref 8.6–10.2)
CHLORIDE: 96 mmol/L (ref 96–106)
CO2: 30 mmol/L — ABNORMAL HIGH (ref 18–29)
Creatinine, Ser: 1.31 mg/dL — ABNORMAL HIGH (ref 0.76–1.27)
GFR, EST AFRICAN AMERICAN: 62 mL/min/{1.73_m2} (ref >59)
GFR, EST NON AFRICAN AMERICAN: 54 mL/min/{1.73_m2} — AB (ref >59)
GLOBULIN, TOTAL: 3.4 g/dL (ref 1.5–4.5)
Glucose: 74 mg/dL (ref 65–99)
POTASSIUM: 4.8 mmol/L (ref 3.5–5.2)
Sodium: 140 mmol/L (ref 134–144)
TOTAL PROTEIN: 7.7 g/dL (ref 6.0–8.5)

## 2016-10-04 LAB — URINALYSIS, ROUTINE W REFLEX MICROSCOPIC
Bilirubin, UA: NEGATIVE
GLUCOSE, UA: NEGATIVE
Ketones, UA: NEGATIVE
LEUKOCYTES UA: NEGATIVE
NITRITE UA: NEGATIVE
Protein, UA: NEGATIVE
RBC, UA: NEGATIVE
Specific Gravity, UA: 1.022 (ref 1.005–1.030)
Urobilinogen, Ur: 0.2 mg/dL (ref 0.2–1.0)
pH, UA: 5.5 (ref 5.0–7.5)

## 2016-10-04 LAB — TESTOSTERONE: Testosterone: 383 ng/dL (ref 264–916)

## 2016-10-04 LAB — HEPATITIS C ANTIBODY: Hep C Virus Ab: <0.1 s/co ratio (ref 0.0–0.9)

## 2016-10-04 LAB — URIC ACID: Uric Acid: 3.8 mg/dL (ref 3.7–8.6)

## 2016-10-06 NOTE — Telephone Encounter (Signed)
Labs are stable.

## 2016-10-10 ENCOUNTER — Encounter: Payer: Self-pay | Admitting: Rheumatology

## 2016-10-10 ENCOUNTER — Ambulatory Visit (INDEPENDENT_AMBULATORY_CARE_PROVIDER_SITE_OTHER): Payer: PPO | Admitting: Rheumatology

## 2016-10-10 VITALS — BP 134/78 | HR 78 | Resp 14 | Ht 71.5 in | Wt 236.0 lb

## 2016-10-10 DIAGNOSIS — M3501 Sicca syndrome with keratoconjunctivitis: Secondary | ICD-10-CM | POA: Diagnosis not present

## 2016-10-10 DIAGNOSIS — L931 Subacute cutaneous lupus erythematosus: Secondary | ICD-10-CM

## 2016-10-10 DIAGNOSIS — M1A09X0 Idiopathic chronic gout, multiple sites, without tophus (tophi): Secondary | ICD-10-CM | POA: Diagnosis not present

## 2016-10-10 DIAGNOSIS — Z87448 Personal history of other diseases of urinary system: Secondary | ICD-10-CM | POA: Diagnosis not present

## 2016-10-10 DIAGNOSIS — Z79899 Other long term (current) drug therapy: Secondary | ICD-10-CM

## 2016-10-10 DIAGNOSIS — M5136 Other intervertebral disc degeneration, lumbar region: Secondary | ICD-10-CM | POA: Diagnosis not present

## 2016-10-10 DIAGNOSIS — M19041 Primary osteoarthritis, right hand: Secondary | ICD-10-CM

## 2016-10-10 DIAGNOSIS — E291 Testicular hypofunction: Secondary | ICD-10-CM | POA: Diagnosis not present

## 2016-10-10 DIAGNOSIS — M19042 Primary osteoarthritis, left hand: Secondary | ICD-10-CM

## 2016-10-10 NOTE — Patient Instructions (Signed)
Labs are due October 2018 CBC, CMP with GFR, uric acid

## 2016-10-17 DIAGNOSIS — D225 Melanocytic nevi of trunk: Secondary | ICD-10-CM | POA: Diagnosis not present

## 2016-10-27 ENCOUNTER — Other Ambulatory Visit: Payer: Self-pay | Admitting: Rheumatology

## 2016-10-27 NOTE — Telephone Encounter (Signed)
Last Visit: 10/10/16 Next Visit: 03/13/17 Labs: 10/03/16 Stable PLQ Eye Exam: 09/04/15   Patient states he has had his PLQ eye exam updated and will have the eye doctor send updated results.   Okay to refill PLQ?

## 2016-10-31 DIAGNOSIS — L930 Discoid lupus erythematosus: Secondary | ICD-10-CM | POA: Diagnosis not present

## 2016-10-31 DIAGNOSIS — D3132 Benign neoplasm of left choroid: Secondary | ICD-10-CM | POA: Diagnosis not present

## 2016-10-31 DIAGNOSIS — H04123 Dry eye syndrome of bilateral lacrimal glands: Secondary | ICD-10-CM | POA: Diagnosis not present

## 2016-11-07 ENCOUNTER — Other Ambulatory Visit: Payer: Self-pay

## 2016-11-07 NOTE — Telephone Encounter (Signed)
Patient called stating that the pharmacy is needing a authorization for a Rx for Propranolol.  Please Advise.  CB# is [phone].  Thank You.

## 2016-11-07 NOTE — Telephone Encounter (Signed)
Last Visit: 10/10/16 Next visit: 03/13/17  Okay to refill Propranolol?

## 2016-11-07 NOTE — Addendum Note (Signed)
Addended by: Carole Binning on: 11/07/2016 11:21 AM   Modules accepted: Orders

## 2016-11-07 NOTE — Telephone Encounter (Signed)
Left message for patient to advise we will not be able to refill medication as Dr. Estanislado Pandy does not prescribe this medication.

## 2016-11-07 NOTE — Telephone Encounter (Signed)
This medication is not prescribed by me.

## 2016-12-05 DIAGNOSIS — Z79899 Other long term (current) drug therapy: Secondary | ICD-10-CM | POA: Diagnosis not present

## 2016-12-11 ENCOUNTER — Other Ambulatory Visit: Payer: Self-pay

## 2016-12-11 ENCOUNTER — Encounter (HOSPITAL_BASED_OUTPATIENT_CLINIC_OR_DEPARTMENT_OTHER): Payer: Self-pay | Admitting: *Deleted

## 2016-12-11 ENCOUNTER — Inpatient Hospital Stay (HOSPITAL_BASED_OUTPATIENT_CLINIC_OR_DEPARTMENT_OTHER)
Admission: EM | Admit: 2016-12-11 | Discharge: 2016-12-13 | DRG: 247 | Disposition: A | Discharge status: Home / Self Care | Payer: PPO | Attending: Internal Medicine | Admitting: Internal Medicine

## 2016-12-11 ENCOUNTER — Emergency Department (HOSPITAL_BASED_OUTPATIENT_CLINIC_OR_DEPARTMENT_OTHER): Payer: PPO

## 2016-12-11 DIAGNOSIS — Z8249 Family history of ischemic heart disease and other diseases of the circulatory system: Secondary | ICD-10-CM | POA: Diagnosis not present

## 2016-12-11 DIAGNOSIS — N183 Chronic kidney disease, stage 3 (moderate): Secondary | ICD-10-CM | POA: Diagnosis present

## 2016-12-11 DIAGNOSIS — Z79899 Other long term (current) drug therapy: Secondary | ICD-10-CM | POA: Diagnosis not present

## 2016-12-11 DIAGNOSIS — Z9861 Coronary angioplasty status: Secondary | ICD-10-CM

## 2016-12-11 DIAGNOSIS — M109 Gout, unspecified: Secondary | ICD-10-CM | POA: Diagnosis present

## 2016-12-11 DIAGNOSIS — I214 Non-ST elevation (NSTEMI) myocardial infarction: Principal | ICD-10-CM | POA: Diagnosis present

## 2016-12-11 DIAGNOSIS — Z87891 Personal history of nicotine dependence: Secondary | ICD-10-CM | POA: Diagnosis not present

## 2016-12-11 DIAGNOSIS — M3500 Sicca syndrome, unspecified: Secondary | ICD-10-CM | POA: Diagnosis not present

## 2016-12-11 DIAGNOSIS — E039 Hypothyroidism, unspecified: Secondary | ICD-10-CM | POA: Diagnosis not present

## 2016-12-11 DIAGNOSIS — R0602 Shortness of breath: Secondary | ICD-10-CM | POA: Diagnosis not present

## 2016-12-11 DIAGNOSIS — I10 Essential (primary) hypertension: Secondary | ICD-10-CM | POA: Diagnosis not present

## 2016-12-11 DIAGNOSIS — I129 Hypertensive chronic kidney disease with stage 1 through stage 4 chronic kidney disease, or unspecified chronic kidney disease: Secondary | ICD-10-CM | POA: Diagnosis not present

## 2016-12-11 DIAGNOSIS — IMO0002 Reserved for concepts with insufficient information to code with codable children: Secondary | ICD-10-CM | POA: Diagnosis present

## 2016-12-11 DIAGNOSIS — R079 Chest pain, unspecified: Secondary | ICD-10-CM | POA: Diagnosis not present

## 2016-12-11 DIAGNOSIS — M329 Systemic lupus erythematosus, unspecified: Secondary | ICD-10-CM | POA: Diagnosis not present

## 2016-12-11 DIAGNOSIS — Z955 Presence of coronary angioplasty implant and graft: Secondary | ICD-10-CM

## 2016-12-11 DIAGNOSIS — Z833 Family history of diabetes mellitus: Secondary | ICD-10-CM | POA: Diagnosis not present

## 2016-12-11 DIAGNOSIS — I2510 Atherosclerotic heart disease of native coronary artery without angina pectoris: Secondary | ICD-10-CM | POA: Diagnosis not present

## 2016-12-11 HISTORY — DX: Atherosclerotic heart disease of native coronary artery without angina pectoris: I25.10

## 2016-12-11 HISTORY — DX: Coronary angioplasty status: Z98.61

## 2016-12-11 LAB — COMPREHENSIVE METABOLIC PANEL
ALBUMIN: 4.5 g/dL (ref 3.5–5.0)
ALT: 24 U/L (ref 17–63)
ANION GAP: 10 (ref 5–15)
AST: 33 U/L (ref 15–41)
Alkaline Phosphatase: 72 U/L (ref 38–126)
BILIRUBIN TOTAL: 0.6 mg/dL (ref 0.3–1.2)
BUN: 25 mg/dL — AB (ref 6–20)
CALCIUM: 9 mg/dL (ref 8.9–10.3)
CO2: 31 mmol/L (ref 22–32)
CREATININE: 1.49 mg/dL — AB (ref 0.61–1.24)
Chloride: 99 mmol/L — ABNORMAL LOW (ref 101–111)
GFR calc Af Amer: 52 mL/min — ABNORMAL LOW (ref >60)
GFR calc non Af Amer: 45 mL/min — ABNORMAL LOW (ref >60)
Glucose, Bld: 117 mg/dL — ABNORMAL HIGH (ref 65–99)
POTASSIUM: 4.2 mmol/L (ref 3.5–5.1)
SODIUM: 140 mmol/L (ref 135–145)
TOTAL PROTEIN: 8.4 g/dL — AB (ref 6.5–8.1)

## 2016-12-11 LAB — CBC WITH DIFFERENTIAL/PLATELET
BASOS PCT: 0 %
Basophils Absolute: 0 10*3/uL (ref 0.0–0.1)
EOS ABS: 0.2 10*3/uL (ref 0.0–0.7)
Eosinophils Relative: 3 %
HEMATOCRIT: 50.6 % (ref 39.0–52.0)
Hemoglobin: 16.6 g/dL (ref 13.0–17.0)
Lymphocytes Relative: 25 %
Lymphs Abs: 2 10*3/uL (ref 0.7–4.0)
MCH: 31.1 pg (ref 26.0–34.0)
MCHC: 32.8 g/dL (ref 30.0–36.0)
MCV: 94.8 fL (ref 78.0–100.0)
MONO ABS: 0.9 10*3/uL (ref 0.1–1.0)
MONOS PCT: 11 %
Neutro Abs: 4.7 10*3/uL (ref 1.7–7.7)
Neutrophils Relative %: 61 %
Platelets: 208 10*3/uL (ref 150–400)
RBC: 5.34 MIL/uL (ref 4.22–5.81)
RDW: 15.2 % (ref 11.5–15.5)
WBC: 7.8 10*3/uL (ref 4.0–10.5)

## 2016-12-11 LAB — TROPONIN I
TROPONIN I: 0.38 ng/mL — AB (ref <0.03)
Troponin I: 0.32 ng/mL (ref <0.03)

## 2016-12-11 MED ORDER — HEPARIN BOLUS VIA INFUSION
4000.0000 [IU] | Freq: Once | INTRAVENOUS | Status: AC
  Administered 2016-12-11: 4000 [IU] via INTRAVENOUS

## 2016-12-11 MED ORDER — NITROGLYCERIN 0.4 MG SL SUBL
0.4000 mg | SUBLINGUAL_TABLET | SUBLINGUAL | Status: DC | PRN
  Administered 2016-12-11 (×3): 0.4 mg via SUBLINGUAL
  Filled 2016-12-11 (×3): qty 1

## 2016-12-11 MED ORDER — ASPIRIN 81 MG PO CHEW
324.0000 mg | CHEWABLE_TABLET | Freq: Once | ORAL | Status: AC
  Administered 2016-12-11: 324 mg via ORAL
  Filled 2016-12-11: qty 4

## 2016-12-11 MED ORDER — HEPARIN (PORCINE) IN NACL 100-0.45 UNIT/ML-% IJ SOLN
1100.0000 [IU]/h | INTRAMUSCULAR | Status: DC
  Administered 2016-12-11: 1400 [IU]/h via INTRAVENOUS
  Filled 2016-12-11 (×2): qty 250

## 2016-12-11 NOTE — ED Notes (Signed)
Family at bedside. 

## 2016-12-11 NOTE — ED Notes (Signed)
Patient transported to X-ray 

## 2016-12-11 NOTE — ED Triage Notes (Signed)
Pt c/o mid sternal chest pain and SOB with exertion x 2 days

## 2016-12-11 NOTE — Progress Notes (Signed)
[facility] for heparin  Indication: chest pain/ACS  Allergies  Allergen Reactions  . Bee Venom     Patient Measurements: Height: 6' (182.9 cm) Weight: 230 lb (104.3 kg) IBW/kg (Calculated) : 77.6 Heparin Dosing Weight: 100  Vital Signs: Temp: 99.3 F (37.4 C) (07/26 2008) Temp Source: Oral (07/26 2008) BP: 155/86 (07/26 2108) Pulse Rate: 93 (07/26 2108)  Labs:  Recent Labs  12/11/16 2014  HGB 16.6  HCT 50.6  PLT 208  CREATININE 1.49*  TROPONINI 0.32*    Estimated Creatinine Clearance: 56 mL/min (A) (by C-G formula based on SCr of 1.49 mg/dL (H)).   Medical History: Past Medical History:  Diagnosis Date  . Gout   . Hypertension   . Hypothyroidism   . Migraine   . Sjogren's disease ([place])   . Systemic lupus erythematosus (HCC)     Assessment: 71 yo male to start heparin for ACS workup. Initial troponin 0.32. Non known anticoagulation prior to arrival and CBC stable.   Goal of Therapy:  Heparin level 0.3-0.7 units/ml Monitor platelets by anticoagulation protocol: Yes   Plan:  1. Give 4000 units bolus x 1 2. Start heparin infusion at 1400 units/hr 3. Check anti-Xa level in 8 hours and daily while on heparin 4. Continue to monitor H&H and platelets  Vincenza Hews, PharmD, BCPS 12/11/2016, 9:17 PM

## 2016-12-11 NOTE — ED Provider Notes (Signed)
[facility] [place] [facility] DEPT [facility] Provider Note   CSN: [id number] Arrival date & time: 12/11/16  2005  By signing my name below, I, [name], attest that this documentation has been prepared under the direction and in the presence of Quintella Reichert, MD. Electronically Signed: [name], ED Scribe. 12/11/16. 8:54 PM.  History   Chief Complaint Chief Complaint  Patient presents with  . Chest Pain    HPI Dennis Moon is a 71 y.o. male with a PMHx of gout, HTN, lupus, kidney disease, and Sjogren's disease who presents to the Emergency Department complaining of waxing and waning, mid sternal, 7/10, heavy, piercing chest pain that started last night, 12/11/16. He notes that his pain improved when he went to sleep. Today he had ~ 3 episodes throughout the day. Pain radiates to his left arm, but doesn't feel the same. Associated sx include SOB. Exertion exacerbates his discomfort. Using oxygen alleviated his sx. Doesn't smoke. No FHx of heart disease. No hx of stress test or heart cath. No allergies to medication. Pt denies nausea, chills, and diaphoresis.  The history is provided by the patient. No language interpreter was used.    Past Medical History:  Diagnosis Date  . Gout   . Hypertension   . Hypothyroidism   . Migraine   . Sjogren's disease ([place])   . Systemic lupus erythematosus ([place])     Patient Active Problem List   Diagnosis Date Noted  . NSTEMI (non-ST elevated myocardial infarction) ([place]) 12/11/2016  . Gout of multiple sites 08/26/2016  . Chronic renal impairment 08/26/2016  . Chronic kidney disease 08/26/2016  . Sjogren's syndrome ([place]) 04/23/2016  . 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 Idiopathic chronic gout, unspecified site, without tophus (tophi) 04/23/2016  . High risk medication use 04/23/2016  . Primary osteoarthritis of both hands 04/23/2016  . DDD (degenerative disc disease), lumbar 04/23/2016  . Kidney disease 04/23/2016    Past Surgical History:    Procedure Laterality Date  . CATARACT EXTRACTION, BILATERAL Bilateral 2017  . HERNIA REPAIR    . THYROID SURGERY  1997       Home Medications    Prior to Admission medications   Medication Sig Start Date End Date Taking? Authorizing Provider  acyclovir ointment (ZOVIRAX) 5 % Apply topically.    [provider]  allopurinol (ZYLOPRIM) 300 MG tablet 1.5 TABS ONCE DAILY 04/09/16   Bo Merino, MD  amLODipine (NORVASC) 5 MG tablet Take 5 mg by mouth daily.    [provider]  aspirin-acetaminophen-caffeine (EXCEDRIN MIGRAINE) [id number] MG tablet Take by mouth.    [provider]  DULoxetine (CYMBALTA) 60 MG capsule Take 60 mg by mouth. 04/24/16 05/24/16  [provider]  esomeprazole (NEXIUM) 20 MG capsule Take 20 mg by mouth.    [provider]  furosemide (LASIX) 20 MG tablet Take 20 mg by mouth. 04/24/16   [provider]  hydroxychloroquine (PLAQUENIL) 200 MG tablet TAKE 1 TABLET BY MOUTH TWICE DAILY 10/27/16   Panwala, Naitik, PA-C  levothyroxine (SYNTHROID, LEVOTHROID) 100 MCG tablet Take by mouth. 04/24/16   [provider]  propranolol (INDERAL) 60 MG tablet Take 60 mg by mouth. 04/24/16   [provider]  Testosterone 20.25 MG/ACT (1.62%) GEL Place 40.5 mg onto the skin. 04/15/16   [provider]    Family History Family History  Problem Relation Age of Onset  . Diabetes Mother   . Hypertension Mother   . Hypertension Father     Social History Social  History  Substance Use Topics  . Smoking status: Former Smoker    Packs/day: 1.00    Years: 6.00    Quit date: 04/25/1966  . Smokeless tobacco: Never Used  . Alcohol use 4.2 oz/week    7 Glasses of wine per week     Allergies   Bee venom   Review of Systems Review of Systems  Constitutional: Negative for chills and diaphoresis.  Respiratory: Positive for shortness of breath.   Cardiovascular: Positive for chest pain.   Gastrointestinal: Negative for nausea.  All other systems reviewed and are negative.    Physical Exam Updated Vital Signs BP 119/72   Pulse 81   Temp 99.3 F (37.4 C) (Oral)   Resp 16   Ht 6' (1.829 m)   Wt 104.3 kg (230 lb)   SpO2 97%   BMI 31.19 kg/m   Physical Exam  Constitutional: He is oriented to person, place, and time. He appears well-developed and well-nourished.  HENT:  Head: Normocephalic and atraumatic.  Cardiovascular: Normal rate and regular rhythm.   Murmur heard.  Systolic (faint) murmur is present  Pulmonary/Chest: Effort normal and breath sounds normal. No respiratory distress.  Abdominal: Soft. There is no tenderness. There is no rebound and no guarding.  Musculoskeletal: He exhibits no edema or tenderness.  Neurological: He is alert and oriented to person, place, and time.  Skin: Skin is warm and dry.  Psychiatric: He has a normal mood and affect. His behavior is normal.  Nursing note and vitals reviewed.    ED Treatments / Results  DIAGNOSTIC STUDIES: Oxygen Saturation is 95% on RA, adequate by my interpretation.   COORDINATION OF CARE: 8:54 PM-Discussed next steps with pt which includes taking 324 mg Aspirin, and NTG. It is recommended that pt stays overnight to be observed and has a stress test done. Pt verbalized understanding and is agreeable with the plan.   Labs (all labs ordered are listed, but only abnormal results are displayed) Labs Reviewed  TROPONIN I - Abnormal; Notable for the following:       Result Value   Troponin I 0.32 (*)    All other components within normal limits  COMPREHENSIVE METABOLIC PANEL - Abnormal; Notable for the following:    Chloride 99 (*)    Glucose, Bld 117 (*)    BUN 25 (*)    Creatinine, Ser 1.49 (*)    Total Protein 8.4 (*)    GFR calc non Af Amer 45 (*)    GFR calc Af Amer 52 (*)    All other components within normal limits  TROPONIN I - Abnormal; Notable for the following:    Troponin I 0.38  (*)    All other components within normal limits  CBC WITH DIFFERENTIAL/PLATELET  HEPARIN LEVEL (UNFRACTIONATED)    EKG  EKG Interpretation None       Radiology Dg Chest 2 View  Result Date: 12/11/2016 CLINICAL DATA:  Mid chest pain with shortness of breath for 2 days. EXAM: CHEST  2 VIEW COMPARISON:  None. FINDINGS: The heart size and mediastinal contours are within normal limits. There is elevation of right hemidiaphragm. Minimal atelectasis of right lung base is noted. Both lungs are clear. Degenerative joint changes of the spine are noted. IMPRESSION: No active cardiopulmonary disease. Elevation of right hemidiaphragm with minimal atelectasis of right lung base. Electronically Signed   By: Abelardo Diesel M.D.   On: 12/11/2016 20:37    Procedures Procedures (including critical care time)  CRITICAL CARE Performed by: Quintella Reichert   Total critical care time: 35 minutes  Critical care time was exclusive of separately billable procedures and treating other patients.  Critical care was necessary to treat or prevent imminent or life-threatening deterioration.  Critical care was time spent personally by me on the following activities: development of treatment plan with patient and/or surrogate as well as nursing, discussions with consultants, evaluation of patient's response to treatment, examination of patient, obtaining history from patient or surrogate, ordering and performing treatments and interventions, ordering and review of laboratory studies, ordering and review of radiographic studies, pulse oximetry and re-evaluation of patient's condition.   Medications Ordered in ED Medications  nitroGLYCERIN (NITROSTAT) SL tablet 0.4 mg (0.4 mg Sublingual Given 12/11/16 2117)  heparin ADULT infusion 100 units/mL (25000 units/229mL sodium chloride 0.45%) (1,400 Units/hr Intravenous New Bag/Given 12/11/16 2111)  aspirin chewable tablet 324 mg (324 mg Oral Given 12/11/16 2100)  heparin  bolus via infusion 4,000 Units (4,000 Units Intravenous Bolus from Bag 12/11/16 2110)     Initial Impression / Assessment and Plan / ED Course  I have reviewed the triage vital signs and the nursing notes.  Pertinent labs & imaging results that were available during my care of the patient were reviewed by me and considered in my medical decision making (see chart for details).     Patient here for evaluation waxing and waning chest pain with associated shortness of breath. EKG without ischemic acute ischemic changes. Patient with initial chest pressure that did resolve after 3 sublingual nitroglycerin. BMP with stable renal insufficiency and initial troponin elevated at 0.3. Current clinical presentation is consistent with non-ST elevation MI. He was started on heparin drip and cardiology consulted. Discussed with Dr. Kenton Kingfisher with cardiology who accepts the patient in transfer. Pt updated of findings a studies and recommendation for admission for further treatment and he is in agreement with plan.  Final Clinical Impressions(s) / ED Diagnoses   Final diagnoses:  NSTEMI (non-ST elevated myocardial infarction) [facility])    New Prescriptions New Prescriptions   No medications on file   I personally performed the services described in this documentation, which was scribed in my presence. The recorded information has been reviewed and is accurate.     Quintella Reichert, MD 12/12/16 [phone]

## 2016-12-11 NOTE — ED Notes (Signed)
Pt placed on Oxygen 2L via [place].

## 2016-12-12 ENCOUNTER — Encounter (HOSPITAL_COMMUNITY)
Admission: EM | Disposition: A | Discharge status: Home / Self Care | Payer: Self-pay | Source: Home / Self Care | Attending: Internal Medicine

## 2016-12-12 ENCOUNTER — Other Ambulatory Visit: Payer: Self-pay

## 2016-12-12 ENCOUNTER — Inpatient Hospital Stay (HOSPITAL_COMMUNITY): Payer: PPO

## 2016-12-12 DIAGNOSIS — I2510 Atherosclerotic heart disease of native coronary artery without angina pectoris: Secondary | ICD-10-CM

## 2016-12-12 DIAGNOSIS — I214 Non-ST elevation (NSTEMI) myocardial infarction: Secondary | ICD-10-CM

## 2016-12-12 DIAGNOSIS — Z9861 Coronary angioplasty status: Secondary | ICD-10-CM

## 2016-12-12 DIAGNOSIS — N183 Chronic kidney disease, stage 3 (moderate): Secondary | ICD-10-CM

## 2016-12-12 DIAGNOSIS — R079 Chest pain, unspecified: Secondary | ICD-10-CM

## 2016-12-12 HISTORY — DX: Atherosclerotic heart disease of native coronary artery without angina pectoris: I25.10

## 2016-12-12 HISTORY — PX: LEFT HEART CATH AND CORONARY ANGIOGRAPHY: CATH118249

## 2016-12-12 HISTORY — PX: CORONARY STENT INTERVENTION: CATH118234

## 2016-12-12 HISTORY — DX: Coronary angioplasty status: Z98.61

## 2016-12-12 LAB — BASIC METABOLIC PANEL
ANION GAP: 9 (ref 5–15)
BUN: 20 mg/dL (ref 6–20)
CHLORIDE: 102 mmol/L (ref 101–111)
CO2: 29 mmol/L (ref 22–32)
Calcium: 9 mg/dL (ref 8.9–10.3)
Creatinine, Ser: 1.22 mg/dL (ref 0.61–1.24)
GFR calc non Af Amer: 57 mL/min — ABNORMAL LOW (ref >60)
GLUCOSE: 100 mg/dL — AB (ref 65–99)
Potassium: 3.7 mmol/L (ref 3.5–5.1)
Sodium: 140 mmol/L (ref 135–145)

## 2016-12-12 LAB — HEPATIC FUNCTION PANEL
ALBUMIN: 3.8 g/dL (ref 3.5–5.0)
ALK PHOS: 63 U/L (ref 38–126)
ALT: 23 U/L (ref 17–63)
AST: 29 U/L (ref 15–41)
BILIRUBIN INDIRECT: 0.5 mg/dL (ref 0.3–0.9)
Bilirubin, Direct: 0.1 mg/dL (ref 0.1–0.5)
TOTAL PROTEIN: 7.8 g/dL (ref 6.5–8.1)
Total Bilirubin: 0.6 mg/dL (ref 0.3–1.2)

## 2016-12-12 LAB — CBC
HEMATOCRIT: 45.6 % (ref 39.0–52.0)
HEMOGLOBIN: 15 g/dL (ref 13.0–17.0)
MCH: 30.7 pg (ref 26.0–34.0)
MCHC: 32.9 g/dL (ref 30.0–36.0)
MCV: 93.4 fL (ref 78.0–100.0)
Platelets: 213 10*3/uL (ref 150–400)
RBC: 4.88 MIL/uL (ref 4.22–5.81)
RDW: 14.9 % (ref 11.5–15.5)
WBC: 7.2 10*3/uL (ref 4.0–10.5)

## 2016-12-12 LAB — ECHOCARDIOGRAM COMPLETE
AOPV: 0.79 m/s
AV Peak grad: 6 mmHg
AVAREAVTI: 3.29 cm2
AVPKVEL: 126 cm/s
Area-P 1/2: 3.28 cm2
CHL CUP AV PEAK INDEX: 1.46
CHL CUP DOP CALC LVOT VTI: 20 cm
CHL CUP MV DEC (S): 229
CHL CUP RV SYS PRESS: 20 mmHg
E/e' ratio: 6.14
EWDT: 229 ms
FS: 35 % (ref 28–44)
HEIGHTINCHES: 72 in
IV/PV OW: 0.99
LA ID, A-P, ES: 44 mm
LA diam end sys: 44 mm
LA diam index: 1.96 cm/m2
LA vol A4C: 37.3 ml
LA vol index: 26.1 mL/m2
LA vol: 58.8 mL
LV E/e' medial: 6.14
LV PW d: 11.9 mm — AB (ref 0.6–1.1)
LV e' LATERAL: 10 cm/s
LVEEAVG: 6.14
LVOT area: 4.15 cm2
LVOT diameter: 23 mm
LVOT peak grad rest: 4 mmHg
LVOTPV: 99.8 cm/s
LVOTSV: 83 mL
Lateral S' vel: 9.51 cm/s
MV pk E vel: 61.4 m/s
MVPKAVEL: 79.4 m/s
MVSPHT: 67 ms
PV Reg grad dias: 6 mmHg
PV Reg vel dias: 119 cm/s
Reg peak vel: 206 cm/s
TAPSE: 20.4 mm
TDI e' lateral: 10
TDI e' medial: 5.66
TR max vel: 206 cm/s
WEIGHTICAEL: 3629.65 [oz_av]

## 2016-12-12 LAB — T4, FREE: FREE T4: 0.89 ng/dL (ref 0.61–1.12)

## 2016-12-12 LAB — TROPONIN I
Troponin I: 0.4 ng/mL (ref <0.03)
Troponin I: 0.37 ng/mL (ref <0.03)
Troponin I: 0.34 ng/mL (ref <0.03)

## 2016-12-12 LAB — MRSA PCR SCREENING: MRSA by PCR: NEGATIVE

## 2016-12-12 LAB — POCT ACTIVATED CLOTTING TIME
ACTIVATED CLOTTING TIME: 252 s
Activated Clotting Time: 340 seconds

## 2016-12-12 LAB — LIPID PANEL
CHOL/HDL RATIO: 4.2 ratio
CHOLESTEROL: 198 mg/dL (ref 0–200)
HDL: 47 mg/dL (ref >40)
LDL Cholesterol: 118 mg/dL — ABNORMAL HIGH (ref 0–99)
TRIGLYCERIDES: 163 mg/dL — AB (ref <150)
VLDL: 33 mg/dL (ref 0–40)

## 2016-12-12 LAB — HEPARIN LEVEL (UNFRACTIONATED)
Heparin Unfractionated: 0.75 IU/mL — ABNORMAL HIGH (ref 0.30–0.70)
Heparin Unfractionated: 0.74 IU/mL — ABNORMAL HIGH (ref 0.30–0.70)

## 2016-12-12 LAB — MAGNESIUM: Magnesium: 2.2 mg/dL (ref 1.7–2.4)

## 2016-12-12 LAB — PROTIME-INR
INR: 1
Prothrombin Time: 13.2 seconds (ref 11.4–15.2)

## 2016-12-12 LAB — TSH: TSH: 6.351 u[IU]/mL — ABNORMAL HIGH (ref 0.350–4.500)

## 2016-12-12 SURGERY — LEFT HEART CATH AND CORONARY ANGIOGRAPHY
Anesthesia: LOCAL

## 2016-12-12 MED ORDER — SODIUM CHLORIDE 0.9 % WEIGHT BASED INFUSION
1.0000 mL/kg/h | INTRAVENOUS | Status: DC
  Administered 2016-12-12: 1 mL/kg/h via INTRAVENOUS
  Administered 2016-12-12: 250 mL via INTRAVENOUS

## 2016-12-12 MED ORDER — NITROGLYCERIN 1 MG/10 ML FOR IR/CATH LAB
INTRA_ARTERIAL | Status: DC | PRN
  Administered 2016-12-12 (×3): 200 ug via INTRACORONARY

## 2016-12-12 MED ORDER — NITROGLYCERIN 1 MG/10 ML FOR IR/CATH LAB
INTRA_ARTERIAL | Status: AC
  Filled 2016-12-12: qty 10

## 2016-12-12 MED ORDER — SODIUM CHLORIDE 0.9 % IV SOLN
INTRAVENOUS | Status: DC
  Administered 2016-12-12: 04:00:00 via INTRAVENOUS

## 2016-12-12 MED ORDER — ONDANSETRON HCL 4 MG/2ML IJ SOLN
4.0000 mg | Freq: Four times a day (QID) | INTRAMUSCULAR | Status: DC | PRN
  Administered 2016-12-12 – 2016-12-13 (×3): 4 mg via INTRAVENOUS
  Filled 2016-12-12 (×3): qty 2

## 2016-12-12 MED ORDER — ORAL CARE MOUTH RINSE
15.0000 mL | Freq: Two times a day (BID) | OROMUCOSAL | Status: DC
  Administered 2016-12-12: 15 mL via OROMUCOSAL

## 2016-12-12 MED ORDER — TICAGRELOR 90 MG PO TABS
ORAL_TABLET | ORAL | Status: AC
  Filled 2016-12-12: qty 2

## 2016-12-12 MED ORDER — HYDROXYCHLOROQUINE SULFATE 200 MG PO TABS
200.0000 mg | ORAL_TABLET | Freq: Two times a day (BID) | ORAL | Status: DC
  Administered 2016-12-12 – 2016-12-13 (×2): 200 mg via ORAL
  Filled 2016-12-12 (×4): qty 1

## 2016-12-12 MED ORDER — SODIUM CHLORIDE 0.9 % IV SOLN: 250.0000 mL | INTRAVENOUS | Status: DC | PRN

## 2016-12-12 MED ORDER — SODIUM CHLORIDE 0.9 % WEIGHT BASED INFUSION: 1.0000 mL/kg/h | INTRAVENOUS | Status: DC

## 2016-12-12 MED ORDER — SODIUM CHLORIDE 0.9 % WEIGHT BASED INFUSION: 3.0000 mL/kg/h | INTRAVENOUS | Status: DC

## 2016-12-12 MED ORDER — IOPAMIDOL (ISOVUE-370) INJECTION 76%
INTRAVENOUS | Status: AC
  Filled 2016-12-12: qty 100

## 2016-12-12 MED ORDER — AMLODIPINE BESYLATE 5 MG PO TABS
5.0000 mg | ORAL_TABLET | Freq: Every day | ORAL | Status: DC
  Administered 2016-12-12 – 2016-12-13 (×2): 5 mg via ORAL
  Filled 2016-12-12 (×2): qty 1

## 2016-12-12 MED ORDER — HEPARIN SODIUM (PORCINE) 1000 UNIT/ML IJ SOLN
INTRAMUSCULAR | Status: AC
  Filled 2016-12-12: qty 1

## 2016-12-12 MED ORDER — LIDOCAINE HCL 1 % IJ SOLN
INTRAMUSCULAR | Status: AC
  Filled 2016-12-12: qty 20

## 2016-12-12 MED ORDER — VERAPAMIL HCL 2.5 MG/ML IV SOLN
INTRAVENOUS | Status: AC
  Filled 2016-12-12: qty 2

## 2016-12-12 MED ORDER — ACETAMINOPHEN 325 MG PO TABS
650.0000 mg | ORAL_TABLET | ORAL | Status: DC | PRN
  Administered 2016-12-12: 650 mg via ORAL
  Filled 2016-12-12: qty 2

## 2016-12-12 MED ORDER — HEPARIN (PORCINE) IN NACL 2-0.9 UNIT/ML-% IJ SOLN
INTRAMUSCULAR | Status: AC
  Filled 2016-12-12: qty 1000

## 2016-12-12 MED ORDER — SODIUM CHLORIDE 0.9% FLUSH: 3.0000 mL | INTRAVENOUS | Status: DC | PRN

## 2016-12-12 MED ORDER — SODIUM CHLORIDE 0.9 % WEIGHT BASED INFUSION
1.0000 mL/kg/h | INTRAVENOUS | Status: AC
  Administered 2016-12-12: 20:00:00 1 mL/kg/h via INTRAVENOUS

## 2016-12-12 MED ORDER — HEPARIN SODIUM (PORCINE) 1000 UNIT/ML IJ SOLN
INTRAMUSCULAR | Status: DC | PRN
  Administered 2016-12-12: 3000 [IU] via INTRAVENOUS
  Administered 2016-12-12: 4000 [IU] via INTRAVENOUS
  Administered 2016-12-12: 5000 [IU] via INTRAVENOUS

## 2016-12-12 MED ORDER — MORPHINE SULFATE (PF) 4 MG/ML IV SOLN
2.0000 mg | INTRAVENOUS | Status: DC | PRN
  Administered 2016-12-12: 2 mg via INTRAVENOUS
  Filled 2016-12-12: qty 1

## 2016-12-12 MED ORDER — TRAZODONE HCL 50 MG PO TABS
50.0000 mg | ORAL_TABLET | Freq: Every day | ORAL | Status: DC
  Administered 2016-12-12: 50 mg via ORAL
  Filled 2016-12-12: qty 1

## 2016-12-12 MED ORDER — TICAGRELOR 90 MG PO TABS
90.0000 mg | ORAL_TABLET | Freq: Two times a day (BID) | ORAL | Status: DC
  Administered 2016-12-13 (×2): 90 mg via ORAL
  Filled 2016-12-12 (×2): qty 1

## 2016-12-12 MED ORDER — METOPROLOL TARTRATE 25 MG PO TABS
25.0000 mg | ORAL_TABLET | Freq: Two times a day (BID) | ORAL | Status: DC
  Administered 2016-12-12 (×3): 25 mg via ORAL
  Filled 2016-12-12 (×3): qty 1

## 2016-12-12 MED ORDER — LEVOTHYROXINE SODIUM 100 MCG PO TABS
100.0000 ug | ORAL_TABLET | Freq: Every day | ORAL | Status: DC
  Administered 2016-12-12 – 2016-12-13 (×2): 100 ug via ORAL
  Filled 2016-12-12 (×2): qty 1

## 2016-12-12 MED ORDER — TICAGRELOR 90 MG PO TABS
ORAL_TABLET | ORAL | Status: DC | PRN
  Administered 2016-12-12: 180 mg via ORAL

## 2016-12-12 MED ORDER — ASPIRIN EC 81 MG PO TBEC
81.0000 mg | DELAYED_RELEASE_TABLET | Freq: Every day | ORAL | Status: DC
  Administered 2016-12-13: 10:00:00 81 mg via ORAL
  Filled 2016-12-12: qty 1

## 2016-12-12 MED ORDER — HEART ATTACK BOUNCING BOOK
Freq: Once | Status: AC
  Administered 2016-12-12: 21:00:00
  Filled 2016-12-12: qty 1

## 2016-12-12 MED ORDER — IOPAMIDOL (ISOVUE-370) INJECTION 76%
INTRAVENOUS | Status: AC
  Filled 2016-12-12: qty 50

## 2016-12-12 MED ORDER — LIDOCAINE HCL (PF) 1 % IJ SOLN
INTRAMUSCULAR | Status: DC | PRN
  Administered 2016-12-12: 2 mL

## 2016-12-12 MED ORDER — MIDAZOLAM HCL 2 MG/2ML IJ SOLN
INTRAMUSCULAR | Status: DC | PRN
  Administered 2016-12-12: 1 mg via INTRAVENOUS

## 2016-12-12 MED ORDER — FENTANYL CITRATE (PF) 100 MCG/2ML IJ SOLN
INTRAMUSCULAR | Status: DC | PRN
  Administered 2016-12-12: 50 ug via INTRAVENOUS
  Administered 2016-12-12: 25 ug via INTRAVENOUS

## 2016-12-12 MED ORDER — SODIUM CHLORIDE 0.9% FLUSH: 3.0000 mL | Freq: Two times a day (BID) | INTRAVENOUS | Status: DC

## 2016-12-12 MED ORDER — HEPARIN (PORCINE) IN NACL 2-0.9 UNIT/ML-% IJ SOLN
INTRAMUSCULAR | Status: DC | PRN
  Administered 2016-12-12: 17:00:00

## 2016-12-12 MED ORDER — SODIUM CHLORIDE 0.9% FLUSH
3.0000 mL | Freq: Two times a day (BID) | INTRAVENOUS | Status: DC
  Administered 2016-12-12: 21:00:00 3 mL via INTRAVENOUS

## 2016-12-12 MED ORDER — ASPIRIN 81 MG PO CHEW
81.0000 mg | CHEWABLE_TABLET | ORAL | Status: AC
  Administered 2016-12-12: 81 mg via ORAL
  Filled 2016-12-12: qty 1

## 2016-12-12 MED ORDER — ASPIRIN 81 MG PO CHEW: 81.0000 mg | CHEWABLE_TABLET | ORAL | Status: DC

## 2016-12-12 MED ORDER — VERAPAMIL HCL 2.5 MG/ML IV SOLN
INTRAVENOUS | Status: DC | PRN
  Administered 2016-12-12: 10 mL via INTRA_ARTERIAL

## 2016-12-12 MED ORDER — ANGIOPLASTY BOOK
Freq: Once | Status: AC
  Administered 2016-12-12: 20:00:00
  Filled 2016-12-12: qty 1

## 2016-12-12 MED ORDER — ATORVASTATIN CALCIUM 80 MG PO TABS
80.0000 mg | ORAL_TABLET | Freq: Every day | ORAL | Status: DC
  Administered 2016-12-12: 19:00:00 80 mg via ORAL
  Filled 2016-12-12: qty 1

## 2016-12-12 MED ORDER — OXYCODONE-ACETAMINOPHEN 5-325 MG PO TABS
1.0000 | ORAL_TABLET | ORAL | Status: DC | PRN
  Administered 2016-12-12 – 2016-12-13 (×2): 1 via ORAL
  Filled 2016-12-12 (×2): qty 1

## 2016-12-12 MED ORDER — FENTANYL CITRATE (PF) 100 MCG/2ML IJ SOLN
INTRAMUSCULAR | Status: AC
  Filled 2016-12-12: qty 2

## 2016-12-12 MED ORDER — MIDAZOLAM HCL 2 MG/2ML IJ SOLN
INTRAMUSCULAR | Status: AC
  Filled 2016-12-12: qty 2

## 2016-12-12 MED ORDER — PANTOPRAZOLE SODIUM 40 MG PO TBEC
40.0000 mg | DELAYED_RELEASE_TABLET | Freq: Every day | ORAL | Status: DC
  Administered 2016-12-12 – 2016-12-13 (×2): 40 mg via ORAL
  Filled 2016-12-12 (×2): qty 1

## 2016-12-12 SURGICAL SUPPLY — 22 items
BALLN SAPPHIRE 2.5X15 (BALLOONS) ×2
BALLN ~~LOC~~ EMERGE MR 3.25X15 (BALLOONS) ×2
BALLN ~~LOC~~ EMERGE MR 4.0X15 (BALLOONS) ×2
BALLOON SAPPHIRE 2.5X15 (BALLOONS) IMPLANT
BALLOON ~~LOC~~ EMERGE MR 3.25X15 (BALLOONS) IMPLANT
BALLOON ~~LOC~~ EMERGE MR 4.0X15 (BALLOONS) IMPLANT
CATH 5FR JL3.5 JR4 ANG PIG MP (CATHETERS) ×1 IMPLANT
CATH VISTA GUIDE 6FR XBLAD3.5 (CATHETERS) ×1 IMPLANT
DEVICE RAD COMP TR BAND LRG (VASCULAR PRODUCTS) ×1 IMPLANT
GLIDESHEATH SLEND SS 6F .021 (SHEATH) ×1 IMPLANT
GUIDEWIRE INQWIRE 1.5J.035X260 (WIRE) IMPLANT
INQWIRE 1.5J .035X260CM (WIRE) ×2
KIT ENCORE 26 ADVANTAGE (KITS) ×1 IMPLANT
KIT HEART LEFT (KITS) ×2 IMPLANT
PACK CARDIAC CATHETERIZATION (CUSTOM PROCEDURE TRAY) ×2 IMPLANT
STENT PROMUS PREM MR 4.0X20 (Permanent Stent) ×1 IMPLANT
STENT XIENCE ALPINE RX 3.0X18 (Permanent Stent) ×1 IMPLANT
SYR MEDRAD MARK V 150ML (SYRINGE) ×2 IMPLANT
TRANSDUCER W/STOPCOCK (MISCELLANEOUS) ×2 IMPLANT
TUBING CIL FLEX 10 FLL-RA (TUBING) ×2 IMPLANT
WIRE ASAHI PROWATER 180CM (WIRE) ×1 IMPLANT
WIRE EMERALD ST .035X150CM (WIRE) IMPLANT

## 2016-12-12 NOTE — Progress Notes (Signed)
Progress Note  Patient Name: Dennis Moon Date of Encounter: 12/12/2016  Primary Cardiologist: Dr. 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 He is mostly sedentary and has been having some mild exertional dyspnea and CP. He does have an elevated troponin at  0.40 at 3:30 this morning.   Has not had any further episodes of pain since last night. Has not been having any orthopnea or LE swelling.  Blood pressure 132/75, pulse 66, temperature 98.3 F (36.8 C), temperature source Oral, resp. rate 14, height 6' (1.829 m), weight 226 lb 13.7 oz (102.9 kg), SpO2 97 %.   Inpatient Medications    Scheduled Meds: . amLODipine  5 mg Oral Daily  . [START ON 12/13/2016] aspirin EC  81 mg Oral Daily  . atorvastatin  80 mg Oral q1800  . hydroxychloroquine  200 mg Oral BID  . levothyroxine  100 mcg Oral QAC breakfast  . mouth rinse  15 mL Mouth Rinse BID  . metoprolol tartrate  25 mg Oral BID  . pantoprazole  40 mg Oral Daily  . traZODone  50 mg Oral QHS   Continuous Infusions: . sodium chloride 75 mL/hr at 12/12/16 0347  . heparin 1,300 Units/hr (12/12/16 0624)   PRN Meds: acetaminophen, nitroGLYCERIN, ondansetron (ZOFRAN) IV   Vital Signs    Vitals:   12/12/16 0519 12/12/16 0600 12/12/16 0739 12/12/16 0800  BP: 135/84 127/84 (!) 100/55 132/75  Pulse: 80 75 73 66  Resp: 18 15 15 14   Temp: 97.8 F (36.6 C)  98.3 F (36.8 C)   TempSrc: Oral  Oral   SpO2: 92%  94% 97%  Weight:      Height:        Intake/Output Summary (Last 24 hours) at 12/12/16 0851 Last data filed at 12/12/16 0532  Gross per 24 hour  Intake           111.68 ml  Output             1000 ml  Net          -888.32 ml   Filed Weights   12/11/16 2008 12/11/16 2011 12/12/16 0220  Weight: 230 lb (104.3 kg) 230 lb (104.3 kg) 226 lb 13.7 oz (102.9 kg)    Telemetry    NSR - Personally Reviewed   Physical Exam     GEN: Well nourished, well developed HEENT: normal  Neck: no JVD, carotid bruits, or masses Cardiac: RRR. no murmurs, rubs, or gallops,no edema. Intact distal pulses bilaterally.  Respiratory: clear to auscultation bilaterally, normal work of breathing GI: soft, nontender, nondistended, + BS MS: no deformity or atrophy  Skin: warm and dry, no rash Neuro: Alert and Oriented x 3, Strength and sensation are intact Psych:   Full affect  Labs    Chemistry Recent Labs Lab 12/11/16 2014 12/12/16 0330  NA 140 140  K 4.2 3.7  CL 99* 102  CO2 31 29  GLUCOSE 117* 100*  BUN 25* 20  CREATININE 1.49* 1.22  CALCIUM 9.0 9.0  PROT 8.4* 7.8  ALBUMIN 4.5 3.8  AST 33 29  ALT 24 23  ALKPHOS 72 63  BILITOT 0.6 0.6  GFRNONAA 45* 57*  GFRAA 52* >60  ANIONGAP 10 9     Hematology Recent Labs Lab 12/11/16 2014 12/12/16 0330  WBC 7.8 7.2  RBC 5.34 4.88  HGB 16.6 15.0  HCT 50.6  45.6  MCV 94.8 93.4  MCH 31.1 30.7  MCHC 32.8 32.9  RDW 15.2 14.9  PLT 208 213    Cardiac Enzymes Recent Labs Lab 12/11/16 2014 12/11/16 2309 12/12/16 0330  TROPONINI 0.32* 0.38* 0.40*    Radiology    Dg Chest 2 View  Result Date: 12/11/2016 CLINICAL DATA:  Mid chest pain with shortness of breath for 2 days. EXAM: CHEST  2 VIEW COMPARISON:  None. FINDINGS: The heart size and mediastinal contours are within normal limits. There is elevation of right hemidiaphragm. Minimal atelectasis of right lung base is noted. Both lungs are clear. Degenerative joint changes of the spine are noted. IMPRESSION: No active cardiopulmonary disease. Elevation of right hemidiaphragm with minimal atelectasis of right lung base. Electronically Signed   By: Abelardo Diesel M.D.   On: 12/11/2016 20:37    Cardiac Studies   Echocardiogram pending for this admission   Patient Profile     Dennis Moon is a 71 y.o. old male with medical history noted below presents with acute complaints of chest pain.  The patient  states over the last week or so he's had increased fatigue with some mild exertional dyspnea.  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  Two nights ago, he had some mild discomfort which terminated on it's own which he dismissed as GERD.  However this afternoon after work again developed chest discomfort which again stopped on it's own.  Then while walking to his car the pain returned however more severe with associated dyspnea.  He rated his pain a 7/10 at it's peak. He describes the pain as a stabbing pain in the center of his chest which radiates to the left arm.  Because of this recurrent pain, he presented to the local ED.  He was given O2 and 3 nitroglycerin which resolved his pain.  Assessment & Plan    1. New onset exertional chest pain (STEMI): He has no known history of CAD. Has not had ischemic work-up in the past. His troponin peaked at 0.40. No history of having chest pains. No EKG changes noted. He has not had any further pain since 10 pm last night.  Plan is for cardiac catheterization today. The patient understands that risks included but are not limited to stroke (1 in 1000), death (1 in 98), kidney failure [usually temporary] (1 in 500), bleeding (1 in 200), allergic reaction [possibly serious] (1 in 200).   Blood pressure is well controlled. He is in stable condition and well appearing, Creatinine is 1.22 and hemoglobin is 15.    Dennis Glee, PA-C  12/12/2016, 8:51 AM    Patient seen and examined   Agree with findings of T Greene above   Pt currently pain free  LUngs CTA  Cardiac RRR  No murmurs  Ext without edema    Hx concerning for [place]  Trop sl elevated   Would recomm L heart cath to define anatomy  Risks/benefits descrbed  Pt understands  Agrees to proceed    Cr 1.2  Watch contrast   LDL 118  Rx statin  Dorris Carnes

## 2016-12-12 NOTE — Progress Notes (Signed)
  Echocardiogram 2D Echocardiogram has been performed.  Robbin Escher T Adelard Sanon 12/12/2016, 12:07 PM

## 2016-12-12 NOTE — Progress Notes (Signed)
ANTICOAGULATION CONSULT NOTE - Follow Up Consult  Pharmacy Consult for heparin Indication: chest pain/ACS  Allergies  Allergen Reactions  . Bee Venom     Patient Measurements: Height: 6' (182.9 cm) Weight: 226 lb 13.7 oz (102.9 kg) IBW/kg (Calculated) : 77.6 Heparin Dosing Weight: 100  Vital Signs: Temp: 97.8 F (36.6 C) (07/27 0519) Temp Source: Oral (07/27 0519) BP: 135/84 (07/27 0519) Pulse Rate: 80 (07/27 0519)  Labs:  Recent Labs  12/11/16 2014 12/11/16 2309 12/12/16 0330 12/12/16 0512  HGB 16.6  --  15.0  --   HCT 50.6  --  45.6  --   PLT 208  --  213  --   LABPROT  --   --  13.2  --   INR  --   --  1.00  --   HEPARINUNFRC  --   --   --  0.75*  CREATININE 1.49*  --  1.22  --   TROPONINI 0.32* 0.38* 0.40*  --     Estimated Creatinine Clearance: 67.9 mL/min (by C-G formula based on SCr of 1.22 mg/dL).   Medications:  Scheduled:  . amLODipine  5 mg Oral Daily  . [START ON 12/13/2016] aspirin EC  81 mg Oral Daily  . atorvastatin  80 mg Oral q1800  . hydroxychloroquine  200 mg Oral BID  . levothyroxine  100 mcg Oral QAC breakfast  . metoprolol tartrate  25 mg Oral BID  . pantoprazole  40 mg Oral Daily  . traZODone  50 mg Oral QHS    Assessment: 71yo male on heparin for ACS work-up.  Heparin level is a bit above goal.  RN states there is no bleeding and pump setting is correct.  Goal of Therapy:  Heparin level 0.3-0.7 units/ml Monitor platelets by anticoagulation protocol: Yes   Plan:  Decrease heparin to 1300 units/hr Repeat heparin level 6hr Watch for s/s of bleeding   Gracy Bruins, B.S., PharmD [facility]

## 2016-12-12 NOTE — ED Notes (Signed)
Per Maudie Mercury at [place], there is a truck available and they are on their way

## 2016-12-12 NOTE — H&P (View-Only) (Signed)
Progress Note  Patient Name: Dennis Moon Date of Encounter: 12/12/2016  Primary Cardiologist: Dr. 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 He is mostly sedentary and has been having some mild exertional dyspnea and CP. He does have an elevated troponin at  0.40 at 3:30 this morning.   Has not had any further episodes of pain since last night. Has not been having any orthopnea or LE swelling.  Blood pressure 132/75, pulse 66, temperature 98.3 F (36.8 C), temperature source Oral, resp. rate 14, height 6' (1.829 m), weight 226 lb 13.7 oz (102.9 kg), SpO2 97 %.   Inpatient Medications    Scheduled Meds: . amLODipine  5 mg Oral Daily  . [START ON 12/13/2016] aspirin EC  81 mg Oral Daily  . atorvastatin  80 mg Oral q1800  . hydroxychloroquine  200 mg Oral BID  . levothyroxine  100 mcg Oral QAC breakfast  . mouth rinse  15 mL Mouth Rinse BID  . metoprolol tartrate  25 mg Oral BID  . pantoprazole  40 mg Oral Daily  . traZODone  50 mg Oral QHS   Continuous Infusions: . sodium chloride 75 mL/hr at 12/12/16 0347  . heparin 1,300 Units/hr (12/12/16 0624)   PRN Meds: acetaminophen, nitroGLYCERIN, ondansetron (ZOFRAN) IV   Vital Signs    Vitals:   12/12/16 0519 12/12/16 0600 12/12/16 0739 12/12/16 0800  BP: 135/84 127/84 (!) 100/55 132/75  Pulse: 80 75 73 66  Resp: 18 15 15 14   Temp: 97.8 F (36.6 C)  98.3 F (36.8 C)   TempSrc: Oral  Oral   SpO2: 92%  94% 97%  Weight:      Height:        Intake/Output Summary (Last 24 hours) at 12/12/16 0851 Last data filed at 12/12/16 0532  Gross per 24 hour  Intake           111.68 ml  Output             1000 ml  Net          -888.32 ml   Filed Weights   12/11/16 2008 12/11/16 2011 12/12/16 0220  Weight: 230 lb (104.3 kg) 230 lb (104.3 kg) 226 lb 13.7 oz (102.9 kg)    Telemetry    NSR - Personally Reviewed   Physical Exam     GEN: Well nourished, well developed HEENT: normal  Neck: no JVD, carotid bruits, or masses Cardiac: RRR. no murmurs, rubs, or gallops,no edema. Intact distal pulses bilaterally.  Respiratory: clear to auscultation bilaterally, normal work of breathing GI: soft, nontender, nondistended, + BS MS: no deformity or atrophy  Skin: warm and dry, no rash Neuro: Alert and Oriented x 3, Strength and sensation are intact Psych:   Full affect  Labs    Chemistry Recent Labs Lab 12/11/16 2014 12/12/16 0330  NA 140 140  K 4.2 3.7  CL 99* 102  CO2 31 29  GLUCOSE 117* 100*  BUN 25* 20  CREATININE 1.49* 1.22  CALCIUM 9.0 9.0  PROT 8.4* 7.8  ALBUMIN 4.5 3.8  AST 33 29  ALT 24 23  ALKPHOS 72 63  BILITOT 0.6 0.6  GFRNONAA 45* 57*  GFRAA 52* >60  ANIONGAP 10 9     Hematology Recent Labs Lab 12/11/16 2014 12/12/16 0330  WBC 7.8 7.2  RBC 5.34 4.88  HGB 16.6 15.0  HCT 50.6  45.6  MCV 94.8 93.4  MCH 31.1 30.7  MCHC 32.8 32.9  RDW 15.2 14.9  PLT 208 213    Cardiac Enzymes Recent Labs Lab 12/11/16 2014 12/11/16 2309 12/12/16 0330  TROPONINI 0.32* 0.38* 0.40*    Radiology    Dg Chest 2 View  Result Date: 12/11/2016 CLINICAL DATA:  Mid chest pain with shortness of breath for 2 days. EXAM: CHEST  2 VIEW COMPARISON:  None. FINDINGS: The heart size and mediastinal contours are within normal limits. There is elevation of right hemidiaphragm. Minimal atelectasis of right lung base is noted. Both lungs are clear. Degenerative joint changes of the spine are noted. IMPRESSION: No active cardiopulmonary disease. Elevation of right hemidiaphragm with minimal atelectasis of right lung base. Electronically Signed   By: Abelardo Diesel M.D.   On: 12/11/2016 20:37    Cardiac Studies   Echocardiogram pending for this admission   Patient Profile     Dennis Moon is a 71 y.o. old male with medical history noted below presents with acute complaints of chest pain.  The patient  states over the last week or so he's had increased fatigue with some mild exertional dyspnea.  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  Two nights ago, he had some mild discomfort which terminated on it's own which he dismissed as GERD.  However this afternoon after work again developed chest discomfort which again stopped on it's own.  Then while walking to his car the pain returned however more severe with associated dyspnea.  He rated his pain a 7/10 at it's peak. He describes the pain as a stabbing pain in the center of his chest which radiates to the left arm.  Because of this recurrent pain, he presented to the local ED.  He was given O2 and 3 nitroglycerin which resolved his pain.  Assessment & Plan    1. New onset exertional chest pain (STEMI): He has no known history of CAD. Has not had ischemic work-up in the past. His troponin peaked at 0.40. No history of having chest pains. No EKG changes noted. He has not had any further pain since 10 pm last night.  Plan is for cardiac catheterization today. The patient understands that risks included but are not limited to stroke (1 in 1000), death (1 in 48), kidney failure [usually temporary] (1 in 500), bleeding (1 in 200), allergic reaction [possibly serious] (1 in 200).   Blood pressure is well controlled. He is in stable condition and well appearing, Creatinine is 1.22 and hemoglobin is 15.    Kristopher Glee, PA-C  12/12/2016, 8:51 AM    Patient seen and examined   Agree with findings of T Greene above   Pt currently pain free  LUngs CTA  Cardiac RRR  No murmurs  Ext without edema    Hx concerning for [place]  Trop sl elevated   Would recomm L heart cath to define anatomy  Risks/benefits descrbed  Pt understands  Agrees to proceed    Cr 1.2  Watch contrast   LDL 118  Rx statin  Dorris Carnes

## 2016-12-12 NOTE — Progress Notes (Signed)
ANTICOAGULATION CONSULT NOTE - Follow Up Consult  Pharmacy Consult for heparin Indication: chest pain/ACS  Allergies  Allergen Reactions  . Bee Venom Swelling    Patient Measurements: Height: 6' (182.9 cm) Weight: 224 lb 13.9 oz (102 kg) IBW/kg (Calculated) : 77.6 Heparin Dosing Weight: 100  Vital Signs: Temp: 98.3 F (36.8 C) (07/27 1120) Temp Source: Oral (07/27 1120) BP: 133/67 (07/27 1200) Pulse Rate: 68 (07/27 1200)  Labs:  Recent Labs  12/11/16 2014 12/11/16 2309 12/12/16 0330 12/12/16 0512 12/12/16 1113  HGB 16.6  --  15.0  --   --   HCT 50.6  --  45.6  --   --   PLT 208  --  213  --   --   LABPROT  --   --  13.2  --   --   INR  --   --  1.00  --   --   HEPARINUNFRC  --   --   --  0.75* 0.74*  CREATININE 1.49*  --  1.22  --   --   TROPONINI 0.32* 0.38* 0.40*  --  0.37*    Estimated Creatinine Clearance: 67.7 mL/min (by C-G formula based on SCr of 1.22 mg/dL).   Medications:  Scheduled:  . amLODipine  5 mg Oral Daily  . aspirin  81 mg Oral Pre-Cath  . [START ON 12/13/2016] aspirin EC  81 mg Oral Daily  . atorvastatin  80 mg Oral q1800  . hydroxychloroquine  200 mg Oral BID  . levothyroxine  100 mcg Oral QAC breakfast  . mouth rinse  15 mL Mouth Rinse BID  . metoprolol tartrate  25 mg Oral BID  . pantoprazole  40 mg Oral Daily  . sodium chloride flush  3 mL Intravenous Q12H  . traZODone  50 mg Oral QHS    Assessment: 71yo male on heparin for ACS work-up.   Heparin level continues to be above goal at 0.74. RN states there is no bleeding and pump setting is correct.  Goal of Therapy:  Heparin level 0.3-0.7 units/ml Monitor platelets by anticoagulation protocol: Yes   Plan:  Decrease heparin to 1100 units/hr Repeat heparin level 6hr Watch for s/s of bleeding  Erin Hearing PharmD., BCPS Clinical Pharmacist Pager [phone] 12/12/2016 1:01 PM

## 2016-12-12 NOTE — Interval H&P Note (Signed)
History and Physical Interval Note:  12/12/2016 4:43 PM  Dennis Moon  has presented today for surgery, with the diagnosis of cp  The various methods of treatment have been discussed with the patient and family. After consideration of risks, benefits and other options for treatment, the patient has consented to  Procedure(s): Left Heart Cath and Coronary Angiography (N/A) as a surgical intervention .  The patient's history has been reviewed, patient examined, no change in status, stable for surgery.  I have reviewed the patient's chart and labs.  Questions were answered to the patient's satisfaction.   Cath Lab Visit (complete for each Cath Lab visit)  Clinical Evaluation Leading to the Procedure:   ACS: Yes.    Non-ACS:    Anginal Classification: CCS IV  Anti-ischemic medical therapy: Minimal Therapy (1 class of medications)  Non-Invasive Test Results: No non-invasive testing performed  Prior CABG: No previous CABG        Dennis Moon [facility] 12/12/2016 4:43 PM

## 2016-12-12 NOTE — H&P (Signed)
CARDIOLOGY HISTORY AND PHYSICAL     Date: 12/12/2016 Admitting Physician: Jacolyn Reedy, MD  Chief Complaint:  Chest pain ____________________________________________________________________ History of Present Illness:  Dennis Moon is a 71 y.o. old male with medical history noted below presents with acute complaints of chest pain.  The patient states over the last week or so he's had increased fatigue with some mild exertional dyspnea.  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  Two nights ago, he had some mild discomfort which terminated on it's own which he dismissed as GERD.  However this afternoon after work again developed chest discomfort which again stopped on it's own.  Then while walking to his car the pain returned however more severe with associated dyspnea.  He rated his pain a 7/10 at it's peak. He describes the pain as a stabbing pain in the center of his chest which radiates to the left arm.  Because of this recurrent pain, he presented to the local ED.  He was given O2 and 3 nitroglycerin which resolved his pain.  He is currently pain free on my exam.  He denies lightheadedness, syncope, diaphoresis, or nausea with the pain.  No previous cardiac history.  Review of Systems:   Review of Systems:  GEN: no fever, chills, nausea, vomiting, weight change  HEENT: no vision or hearing changes  PULM: no coughing, +SOB  CV: no +chest pain, palpitations, PND, orthopnea  GI: no abdominal pain  GU: no dysuria  EXT: no swelling  SKIN: no rashes  NEURO: no numbness or tingling  HEME: no bleeding or bruising  GYN: none  --12 point review systems- otherwise negative.  All other systems reviewed and are negative  Past Medical History:  Diagnosis Date  . Gout   . Hypertension   . Hypothyroidism   . Migraine   . Sjogren's disease ([place])   . Systemic lupus erythematosus ([place])     Past Surgical History:  Procedure Laterality Date  . CATARACT  EXTRACTION, BILATERAL Bilateral 2017  . HERNIA REPAIR    . THYROID SURGERY  1997    Social History   Social History  . Marital status: Married    Spouse name: N/A  . Number of children: N/A  . Years of education: N/A   Occupational History  . Not on file.   Social History Main Topics  . Smoking status: Former Smoker    Packs/day: 1.00    Years: 6.00    Quit date: 04/25/1966  . Smokeless tobacco: Never Used  . Alcohol use 4.2 oz/week    7 Glasses of wine per week  . Drug use: No  . Sexual activity: Not on file   Other Topics Concern  . Not on file   Social History Narrative  . No narrative on file    Family History  Problem Relation Age of Onset  . Diabetes Mother   . Hypertension Mother   . Hypertension Father     Past Cardiovascular History:  - No documented h/o CAD - No documented h/o MI - No documented h/o CHF - No documented h/o PVD - No documented h/o AAA - No documented h/o valvular heart disease - No documented h/o CVA - No documented h/o Arrhythmias - No documented h/o A-fib  - No documented h/o congenital heart disease - No documented h/o CABG - No documented h/o PCI - No documented h/o cardiac devices (Pacer/ICD/CRT) - No documented h/o cardiac surgery  Most recent stress test:  None  Most recent echocardiography:  None  Most recent left heart catheterization:  None  CABG:  Date/ Physician: None  Device history:  None  Prior to Admission medications   Medication Sig Start Date End Date Taking? Authorizing Provider  acyclovir ointment (ZOVIRAX) 5 % Apply topically.    [provider]  allopurinol (ZYLOPRIM) 300 MG tablet 1.5 TABS ONCE DAILY 04/09/16   Bo Merino, MD  amLODipine (NORVASC) 5 MG tablet Take 5 mg by mouth daily.    [provider]  aspirin-acetaminophen-caffeine (EXCEDRIN MIGRAINE) [phone] MG tablet Take by mouth.    [provider]  DULoxetine (CYMBALTA) 60 MG capsule Take 60 mg  by mouth. 04/24/16 05/24/16  [provider]  esomeprazole (NEXIUM) 20 MG capsule Take 20 mg by mouth.    [provider]  furosemide (LASIX) 20 MG tablet Take 20 mg by mouth. 04/24/16   [provider]  hydroxychloroquine (PLAQUENIL) 200 MG tablet TAKE 1 TABLET BY MOUTH TWICE DAILY 10/27/16   Panwala, Naitik, PA-C  levothyroxine (SYNTHROID, LEVOTHROID) 100 MCG tablet Take by mouth. 04/24/16   [provider]  propranolol (INDERAL) 60 MG tablet Take 60 mg by mouth. 04/24/16   [provider]  Testosterone 20.25 MG/ACT (1.62%) GEL Place 40.5 mg onto the skin. 04/15/16   [provider]    Allergies  Allergen Reactions  . Bee Venom     Social History:   Social History  Substance Use Topics  . Smoking status: Former Smoker    Packs/day: 1.00    Years: 6.00    Quit date: 04/25/1966  . Smokeless tobacco: Never Used  . Alcohol use 4.2 oz/week    7 Glasses of wine per week    Family History  Problem Relation Age of Onset  . Diabetes Mother   . Hypertension Mother   . Hypertension Father     Physical Examination: Blood pressure (!) 145/88, pulse 84, temperature 98.3 F (36.8 C), temperature source Oral, resp. rate 15, height 6' (1.829 m), weight 102.9 kg (226 lb 13.7 oz), SpO2 94 %. General:  AAOX 4.  NAD.  NRD.  HENT: Normocephalic. Atraumatic.  No acute abnom. EYES: PERRL EOMI  Neck: Supple.  No JVD.  No bruits. Cardiovascular:  Nl S1. Nl S2. No S3. No S4. Nl PMI. No m/r/c. RRR  Pulmonary/Chest: CTA B. No rales. No wheezing.  Abdomen: Soft, NT, no masses, no organomegaly. Neuro: CN intact, no motor/sensory deficit.  Ext: Warm. No edema.  SKIN- intact   Intake/Output Summary (Last 24 hours) at 12/12/16 0323 Last data filed at 12/12/16 0300  Gross per 24 hour  Intake            81.43 ml  Output              600 ml  Net          -518.57 ml    Troponin (Point of Care Test) No results for input(s): TROPIPOC in the last 72  hours. ____________________________________________________________________ Assessment/Plan  NSTEMI   Assessment:  Patient presents with complaints of what sounds like acceleration angina.  He has minimal troponin elevation.  He has baseline CKD stage 3 which is is being followed by a nephrologist.     Plan  -  NPO  -  Hydration with NS  -  ECHO  -  ASA and heparin  -  Follow enzymes  -  Possible cath tomorrow  CKD Stage 3  Assessment:  Patient with stage 3 CKD currently being followed by nephrology.  Etiology is unknown, however, likely caused by his lupus history.     Plan  -  Pre-hydration in preparation for cath  -  Avoid nephrotoxic medications  Lupus/Sjogren's   Assessment:  Patient is stable on his home plaquenil.  Will continue this for now.     Plan    -  Continue home management  Thank you for consulting cardiology.    Electronically signed by Lowella Dandy 12/12/2016 Baruch Merl, MD, PhD Cardiology

## 2016-12-13 ENCOUNTER — Encounter (HOSPITAL_COMMUNITY): Payer: Self-pay | Admitting: Cardiology

## 2016-12-13 DIAGNOSIS — I10 Essential (primary) hypertension: Secondary | ICD-10-CM

## 2016-12-13 DIAGNOSIS — Z9861 Coronary angioplasty status: Secondary | ICD-10-CM

## 2016-12-13 DIAGNOSIS — I2510 Atherosclerotic heart disease of native coronary artery without angina pectoris: Secondary | ICD-10-CM

## 2016-12-13 LAB — CBC
HCT: 47.1 % (ref 39.0–52.0)
HEMOGLOBIN: 14.7 g/dL (ref 13.0–17.0)
MCH: 29.6 pg (ref 26.0–34.0)
MCHC: 31.2 g/dL (ref 30.0–36.0)
MCV: 95 fL (ref 78.0–100.0)
Platelets: 201 10*3/uL (ref 150–400)
RBC: 4.96 MIL/uL (ref 4.22–5.81)
RDW: 15.1 % (ref 11.5–15.5)
WBC: 8.6 10*3/uL (ref 4.0–10.5)

## 2016-12-13 LAB — BASIC METABOLIC PANEL
ANION GAP: 9 (ref 5–15)
BUN: 14 mg/dL (ref 6–20)
CALCIUM: 8.7 mg/dL — AB (ref 8.9–10.3)
CO2: 27 mmol/L (ref 22–32)
Chloride: 102 mmol/L (ref 101–111)
Creatinine, Ser: 0.98 mg/dL (ref 0.61–1.24)
GFR calc Af Amer: >60 mL/min (ref >60)
Glucose, Bld: 115 mg/dL — ABNORMAL HIGH (ref 65–99)
Potassium: 4.1 mmol/L (ref 3.5–5.1)
Sodium: 138 mmol/L (ref 135–145)

## 2016-12-13 MED ORDER — ATORVASTATIN CALCIUM 80 MG PO TABS: 80.0000 mg | ORAL_TABLET | Freq: Every day | ORAL | 3 refills | Status: DC

## 2016-12-13 MED ORDER — TICAGRELOR 90 MG PO TABS: 90.0000 mg | ORAL_TABLET | Freq: Two times a day (BID) | ORAL | 3 refills | Status: DC

## 2016-12-13 MED ORDER — NITROGLYCERIN 0.4 MG SL SUBL: 0.4000 mg | SUBLINGUAL_TABLET | SUBLINGUAL | 3 refills | Status: DC | PRN

## 2016-12-13 MED ORDER — ALUM & MAG HYDROXIDE-SIMETH 200-200-20 MG/5ML PO SUSP
30.0000 mL | ORAL | Status: DC | PRN
  Administered 2016-12-13: 30 mL via ORAL
  Filled 2016-12-13 (×2): qty 30

## 2016-12-13 MED ORDER — FUROSEMIDE 20 MG PO TABS
20.0000 mg | ORAL_TABLET | Freq: Every day | ORAL | Status: DC
Start: 2016-12-13 — End: 2016-12-13
  Administered 2016-12-13: 10:00:00 20 mg via ORAL
  Filled 2016-12-13: qty 1

## 2016-12-13 MED ORDER — ALLOPURINOL 300 MG PO TABS: ORAL_TABLET | ORAL | 0 refills | Status: DC

## 2016-12-13 MED ORDER — ASPIRIN 81 MG PO TBEC: 81.0000 mg | DELAYED_RELEASE_TABLET | Freq: Every day | ORAL | Status: DC

## 2016-12-13 MED ORDER — HYDROXYCHLOROQUINE SULFATE 200 MG PO TABS: ORAL_TABLET | ORAL | 0 refills | Status: DC

## 2016-12-13 MED ORDER — ONDANSETRON HCL 4 MG PO TABS: 4.0000 mg | ORAL_TABLET | Freq: Three times a day (TID) | ORAL | 0 refills | Status: DC | PRN

## 2016-12-13 MED ORDER — ACETAMINOPHEN 325 MG PO TABS: 650.0000 mg | ORAL_TABLET | ORAL | Status: DC | PRN

## 2016-12-13 MED ORDER — PANTOPRAZOLE SODIUM 40 MG PO TBEC: 40.0000 mg | DELAYED_RELEASE_TABLET | Freq: Every day | ORAL | 3 refills | Status: DC

## 2016-12-13 MED ORDER — PROPRANOLOL HCL 60 MG PO TABS
60.0000 mg | ORAL_TABLET | Freq: Two times a day (BID) | ORAL | Status: DC
  Administered 2016-12-13: 10:00:00 60 mg via ORAL
  Filled 2016-12-13: qty 1

## 2016-12-13 NOTE — Progress Notes (Signed)
Progress Note  Patient Name: Dennis Moon Date of Encounter: 12/13/2016  Primary Cardiologist: new  Subjective   Some nausea this AM   HA  This AM    No CP    Inpatient Medications    Scheduled Meds: . amLODipine  5 mg Oral Daily  . aspirin EC  81 mg Oral Daily  . atorvastatin  80 mg Oral q1800  . hydroxychloroquine  200 mg Oral BID  . levothyroxine  100 mcg Oral QAC breakfast  . mouth rinse  15 mL Mouth Rinse BID  . metoprolol tartrate  25 mg Oral BID  . pantoprazole  40 mg Oral Daily  . sodium chloride flush  3 mL Intravenous Q12H  . ticagrelor  90 mg Oral BID  . traZODone  50 mg Oral QHS   Continuous Infusions: . sodium chloride     PRN Meds: sodium chloride, acetaminophen, alum & mag hydroxide-simeth, morphine injection, nitroGLYCERIN, ondansetron (ZOFRAN) IV, oxyCODONE-acetaminophen, sodium chloride flush   Vital Signs    Vitals:   12/12/16 2030 12/12/16 2100 12/12/16 2200 12/13/16 0234  BP: (!) 161/90 (!) 171/81 (!) 159/91 (!) 141/86  Pulse: 92 95 (!) 104 96  Resp: (!) 21 18 16 18   Temp:    98.7 F (37.1 C)  TempSrc:    Oral  SpO2: 91% 92% (!) 87% 98%  Weight:    236 lb 5.3 oz (107.2 kg)  Height:        Intake/Output Summary (Last 24 hours) at 12/13/16 0652 Last data filed at 12/13/16 0515  Gross per 24 hour  Intake          3170.97 ml  Output             1000 ml  Net          2170.97 ml   Filed Weights   12/12/16 0220 12/12/16 1125 12/13/16 0234  Weight: 226 lb 13.7 oz (102.9 kg) 224 lb 13.9 oz (102 kg) 236 lb 5.3 oz (107.2 kg)    Telemetry    SR  - Personally Reviewed  ECG      Physical Exam   GEN: No acute distress.   Neck: No JVD Cardiac: RRR, no murmurs, rubs, or gallops.  Respiratory: Clear to auscultation bilaterally. GI: Soft, nontender, non-distended  MS: No edema; No deformity.  R wrist OK   Neuro:  Nonfocal  Psych: Normal affect   Labs    Chemistry Recent Labs Lab 12/11/16 2014 12/12/16 0330 12/13/16 0430    NA 140 140 138  K 4.2 3.7 4.1  CL 99* 102 102  CO2 31 29 27   GLUCOSE 117* 100* 115*  BUN 25* 20 14  CREATININE 1.49* 1.22 0.98  CALCIUM 9.0 9.0 8.7*  PROT 8.4* 7.8  --   ALBUMIN 4.5 3.8  --   AST 33 29  --   ALT 24 23  --   ALKPHOS 72 63  --   BILITOT 0.6 0.6  --   GFRNONAA 45* 57* >60  GFRAA 52* >60 >60  ANIONGAP 10 9 9      Hematology Recent Labs Lab 12/11/16 2014 12/12/16 0330 12/13/16 0430  WBC 7.8 7.2 8.6  RBC 5.34 4.88 4.96  HGB 16.6 15.0 14.7  HCT 50.6 45.6 47.1  MCV 94.8 93.4 95.0  MCH 31.1 30.7 29.6  MCHC 32.8 32.9 31.2  RDW 15.2 14.9 15.1  PLT 208 213 201    Cardiac Enzymes Recent Labs Lab 12/11/16 2309 12/12/16 0330  12/12/16 1113 12/12/16 1456  TROPONINI 0.38* 0.40* 0.37* 0.34*   No results for input(s): TROPIPOC in the last 168 hours.   BNPNo results for input(s): BNP, PROBNP in the last 168 hours.   DDimer No results for input(s): DDIMER in the last 168 hours.   Radiology    Dg Chest 2 View  Result Date: 12/11/2016 CLINICAL DATA:  Mid chest pain with shortness of breath for 2 days. EXAM: CHEST  2 VIEW COMPARISON:  None. FINDINGS: The heart size and mediastinal contours are within normal limits. There is elevation of right hemidiaphragm. Minimal atelectasis of right lung base is noted. Both lungs are clear. Degenerative joint changes of the spine are noted. IMPRESSION: No active cardiopulmonary disease. Elevation of right hemidiaphragm with minimal atelectasis of right lung base. Electronically Signed   By: Abelardo Diesel M.D.   On: 12/11/2016 20:37    Cardiac Studies   Conclusion     Mid LAD lesion, 40 %stenosed.  The left ventricular systolic function is normal.  LV end diastolic pressure is normal.  The left ventricular ejection fraction is 55-65% by visual estimate.  Prox LAD lesion, 90 %stenosed.  A STENT PROMUS PREM MR 4.0X20 drug eluting stent was successfully placed.  Post intervention, there is a 0% residual  stenosis.  Ost Cx to Prox Cx lesion, 80 %stenosed.  A STENT XIENCE ALPINE RX 3.0X18 drug eluting stent was successfully placed.  Post intervention, there is a 0% residual stenosis.   1. 2 vessel obstructive CAD 2. Normal LV function 3. Normal LVEDP 4. Successful stenting of the proximal LAD with DES 5. Successful stenting of the proximal LCx with DES.  Plan: DAPT for one year. Anticipate DC in am.      Patient Profile       Assessment & Plan    1 CAD   Stent to LAD and LCx   Plan for 1 year DAPT   2  HTN  BP up this AM  I have resumed his home interal and lasix    3  HL    On high dose statin   Will need f/u as outpt      Signed, Dorris Carnes, MD  12/13/2016, 6:52 AM

## 2016-12-13 NOTE — Progress Notes (Signed)
Tech offered Pt a bath. Pt agreed at the time in front of wife. Wife exits room to leave, and Pt states he would like to wait until wife returns so she can assist with bath. Tech assured Pt that she will assist help with bath. Pt stated that he will wait for wife to come back. Tech gave Pt all bath supplies.

## 2016-12-13 NOTE — Care Management Note (Signed)
Case Management Note  Patient Details  Name: Dennis Moon MRN: [id number] Date of Birth: [birth year]  Subjective/Objective:  71 y.o. M  new Brilinta. CM received call to assist with prior auth and 30D free card. CM instructed RN to give card to pt and have him present to Pharmacy with Rx to have filled. Follow up with PCP prior to Rx running out to have prior auth  Completed by PCP. CM attempted x 2 without success to speak with pt on phone prior to discharge.                    Action/Plan: Will attempt to see pt in room prior to d/c.    Expected Discharge Date:  12/13/16               Expected Discharge Plan:     In-House Referral:     Discharge planning Services  CM Consult, Other - See comment (Brilinta)  Post Acute Care Choice:    Choice offered to:  Patient (Attemted to call room, without answer, x 2)  DME Arranged:    DME Agency:     HH Arranged:    [place] Agency:     Status of Service:  In process, will continue to follow  If discussed at Long Length of Stay Meetings, dates discussed:    Additional Comments:  Delrae Sawyers, RN 12/13/2016, 9:22 AM

## 2016-12-13 NOTE — Progress Notes (Signed)
CARDIAC REHAB PHASE I   PRE:  Rate/Rhythm: 92 SR  BP:  Sitting: 151/78    MODE:  Ambulation: 400 ft   POST:  Rate/Rhythm: 106 ST  BP:  Sitting: 149/82       Pt ambulated 400 ft on RA, handheld assist, mostly steady gait, tolerated well with no complaints. Completed MI/stent education with pt and wife at bedside.  Reviewed risk factors, MI/PCI book, anti-platelet therapy, stent card, activity restrictions, ntg, exercise, heart healthy diet and phase 2 cardiac rehab. Pt verbalized understanding. Pt agrees to phase 2 cardiac rehab referral, will send to [facility] per pt request. Pt to see case manager regarding brilinta prior to discharge. Pt chair after walk, call bell within reach.     [phone] Lenna Sciara, RN, BSN 12/13/2016 9:32 AM

## 2016-12-13 NOTE — Discharge Instructions (Signed)
Radial Site Care °Refer to this sheet in the next few weeks. These instructions provide you with information about caring for yourself after your procedure. Your health care provider may also give you more specific instructions. Your treatment has been planned according to current medical practices, but problems sometimes occur. Call your health care provider if you have any problems or questions after your procedure. °What can I expect after the procedure? °After your procedure, it is typical to have the following: °· Bruising at the radial site that usually fades within 1-2 weeks. °· Blood collecting in the tissue (hematoma) that may be painful to the touch. It should usually decrease in size and tenderness within 1-2 weeks. ° °Follow these instructions at home: °· Take medicines only as directed by your health care provider. °· You may shower 24-48 hours after the procedure or as directed by your health care provider. Remove the bandage (dressing) and gently wash the site with plain soap and water. Pat the area dry with a clean towel. Do not rub the site, because this may cause bleeding. °· Do not take baths, swim, or use a hot tub until your health care provider approves. °· Check your insertion site every day for redness, swelling, or drainage. °· Do not apply powder or lotion to the site. °· Do not flex or bend the affected arm for 24 hours or as directed by your health care provider. °· Do not push or pull heavy objects with the affected arm for 24 hours or as directed by your health care provider. °· Do not lift over 10 lb (4.5 kg) for 5 days after your procedure or as directed by your health care provider. °· Ask your health care provider when it is okay to: °? Return to work or school. °? Resume usual physical activities or sports. °? Resume sexual activity. °· Do not drive home if you are discharged the same day as the procedure. Have someone else drive you. °· You may drive 24 hours after the procedure  unless otherwise instructed by your health care provider. °· Do not operate machinery or power tools for 24 hours after the procedure. °· If your procedure was done as an outpatient procedure, which means that you went home the same day as your procedure, a responsible adult should be with you for the first 24 hours after you arrive home. °· Keep all follow-up visits as directed by your health care provider. This is important. °Contact a health care provider if: °· You have a fever. °· You have chills. °· You have increased bleeding from the radial site. Hold pressure on the site. °Get help right away if: °· You have unusual pain at the radial site. °· You have redness, warmth, or swelling at the radial site. °· You have drainage (other than a small amount of blood on the dressing) from the radial site. °· The radial site is bleeding, and the bleeding does not stop after 30 minutes of holding steady pressure on the site. °· Your arm or hand becomes pale, cool, tingly, or numb. °This information is not intended to replace advice given to you by your health care provider. Make sure you discuss any questions you have with your health care provider. °Document Released: 06/07/2010 Document Revised: 10/11/2015 Document Reviewed: 11/21/2013 °Elsevier Interactive Patient Education © 2018 [employer] °Coronary Angiogram With Stent, Care After °This sheet gives you information about how to care for yourself after your procedure. Your health care provider may also   give you more specific instructions. If you have problems or questions, contact your health care provider. °What can I expect after the procedure? °After your procedure, it is common to have: °· Bruising in the area where a small, thin tube (catheter) was inserted. This usually fades within 1-2 weeks. °· Blood collecting in the tissue (hematoma) that may be painful to the touch. It should usually decrease in size and tenderness within 1-2 weeks. ° °Follow these  instructions at home: °Insertion area care °· Do not take baths, swim, or use a hot tub until your health care provider approves. °· You may shower 24-48 hours after the procedure or as directed by your health care provider. °· Follow instructions from your health care provider about how to take care of your incision. Make sure you: °? Wash your hands with soap and water before you change your bandage (dressing). If soap and water are not available, use hand sanitizer. °? Change your dressing as told by your health care provider. °? Leave stitches (sutures), skin glue, or adhesive strips in place. These skin closures may need to stay in place for 2 weeks or longer. If adhesive strip edges start to loosen and curl up, you may trim the loose edges. Do not remove adhesive strips completely unless your health care provider tells you to do that. °· Remove the bandage (dressing) and gently wash the catheter insertion site with plain soap and water. °· Pat the area dry with a clean towel. Do not rub the area, because that may cause bleeding. °· Do not apply powder or lotion to the incision area. °· Check your incision area every day for signs of infection. Check for: °? More redness, swelling, or pain. °? More fluid or blood. °? Warmth. °? Pus or a bad smell. °Activity °· Do not drive for 24 hours if you were given a medicine to help you relax (sedative). °· Do not lift anything that is heavier than 10 lb (4.5 kg) for 5 days after your procedure or as directed by your health care provider. °· Ask your health care provider when it is okay for you: °? To return to work or school. °? To resume usual physical activities or sports. °? To resume sexual activity. °Eating and drinking °· Eat a heart-healthy diet. This should include plenty of fresh fruits and vegetables. °· Avoid the following types of food: °? Food that is high in salt. °? Canned or highly processed food. °? Food that is high in saturated fat or sugar. °? Fried  food. °· Limit alcohol intake to no more than 1 drink a day for non-pregnant women and 2 drinks a day for men. One drink equals 12 oz of beer, 5 oz of wine, or 1½ oz of hard liquor. °Lifestyle °· Do not use any products that contain nicotine or tobacco, such as cigarettes and e-cigarettes. If you need help quitting, ask your health care provider. °· Take steps to manage and control your weight. °· Get regular exercise. °· Manage your blood pressure. °· Manage other health problems, such as diabetes. °General instructions °· Take over-the-counter and prescription medicines only as told by your health care provider. Blood thinners may be prescribed after your procedure to improve blood flow through the stent. °· If you need an MRI after your heart stent has been placed, be sure to tell the health care provider who orders the MRI that you have a heart stent. °· Keep all follow-up visits as directed by   your health care provider. This is important. °Contact a health care provider if: °· You have a fever. °· You have chills. °· You have increased bleeding from the catheter insertion area. Hold pressure on the area. °Get help right away if: °· You develop chest pain or shortness of breath. °· You feel faint or you pass out. °· You have unusual pain at the catheter insertion area. °· You have redness, warmth, or swelling at the catheter insertion area. °· You have drainage (other than a small amount of blood on the dressing) from the catheter insertion area. °· The catheter insertion area is bleeding, and the bleeding does not stop after 30 minutes of holding steady pressure on the area. °· You develop bleeding from any other place, such as from your rectum. There may be bright red blood in your urine or stool, or it may appear as black, tarry stool. °This information is not intended to replace advice given to you by your health care provider. Make sure you discuss any questions you have with your health care  provider. °Document Released: 11/22/2004 Document Revised: 01/31/2016 Document Reviewed: 01/31/2016 °Elsevier Interactive Patient Education © 2018 Elsevier Inc. ° °

## 2016-12-13 NOTE — Care Management Note (Signed)
Case Management Note  Patient Details  Name: Manraj Yeo MRN: [id number] Date of Birth: 09/23/1945  Subjective/Objective:                    Action/Plan:   Expected Discharge Date:  12/13/16               Expected Discharge Plan:  Home/Self Care  In-House Referral:  NA  Discharge planning Services  CM Consult, Other - See comment (Brilinta)  Post Acute Care Choice:    Choice offered to:  Patient, Spouse (Attemted to call room, without answer, x 2)  DME Arranged:  N/A DME Agency:     HH Arranged:    [place] Agency:     Status of Service:  Completed, signed off  If discussed at [place] of Stay Meetings, dates discussed:    Additional Comments: Returned to room and explained process of Prior auth to pt and spouse who had No questions. Presented with free 30 Day Brilinta card. No further CM needs at this time.   Delrae Sawyers, RN 12/13/2016, 10:32 AM

## 2016-12-13 NOTE — Discharge Summary (Signed)
Patient ID: Dennis Moon,  MRN: [id number], DOB/AGE: 07/25/1945 71 y.o.  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 The pt was placed on ASA and Heparin. Cath done 12/12/16 revealed proximal LAD and CFX disease. The pt underwent PCI with DES to both vessels. He tolerated this well. High dose statin and Brilinta were added. The pt was seen by Dr Harrington Challenger on the morning of the 28 th and felt to be stable for discharge. The pt complained of mild nausea the morning of discharge and requested something for this- Zofran Rx was provided. TOC f/u 1-2 weeks.   Discharge Vitals:  Blood pressure (!) 159/78, pulse 78, temperature 97.7 F (36.5 C), temperature source Oral, resp. rate (!) 21, height 6' (1.829 m), weight 236 lb 5.3 oz (107.2 kg), SpO2 98 %.    Labs: Results for orders placed or performed during the hospital encounter of 12/11/16 (from the past 24 hour(s))  Troponin I     Status: Abnormal   Collection Time: 12/12/16 11:13 AM  Result Value Ref Range   Troponin I 0.37 (HH) <0.03 ng/mL  Heparin level (unfractionated)     Status: Abnormal   Collection Time: 12/12/16 11:13 AM  Result Value Ref Range   Heparin Unfractionated 0.74 (H) 0.30 - 0.70 IU/mL  Troponin I     Status: Abnormal   Collection Time: 12/12/16  2:56 PM  Result Value Ref Range   Troponin I 0.34 (HH) <0.03 ng/mL  POCT Activated clotting time     Status: None   Collection Time: 12/12/16  5:13 PM  Result Value Ref Range   Activated Clotting  Time 252 seconds  POCT Activated clotting time     Status: None   Collection Time: 12/12/16  5:26 PM  Result Value Ref Range   Activated Clotting Time 340 seconds  Basic metabolic panel     Status: Abnormal   Collection Time: 12/13/16  4:30 AM  Result Value Ref Range   Sodium 138 135 - 145 mmol/L   Potassium 4.1 3.5 - 5.1 mmol/L   Chloride 102 101 - 111 mmol/L   CO2 27 22 - 32 mmol/L   Glucose, Bld 115 (H) 65 - 99 mg/dL   BUN 14 6 - 20 mg/dL   Creatinine, Ser 0.98 0.61 - 1.24 mg/dL   Calcium 8.7 (L) 8.9 - 10.3 mg/dL   GFR calc non Af Amer >60 >60 mL/min   GFR calc Af Amer >60 >60 mL/min   Anion gap 9 5 - 15  CBC     Status: None   Collection Time: 12/13/16  4:30 AM  Result Value Ref Range   WBC 8.6 4.0 - 10.5 K/uL   RBC 4.96 4.22 - 5.81 MIL/uL   Hemoglobin 14.7 13.0 - 17.0 g/dL   HCT 47.1 39.0 - 52.0 %   MCV 95.0 78.0 - 100.0 fL   MCH 29.6 26.0 - 34.0  pg   MCHC 31.2 30.0 - 36.0 g/dL   RDW 15.1 11.5 - 15.5 %   Platelets 201 150 - 400 K/uL    Disposition:  Follow-up Information    Martinique, Peter M, MD Follow up.   Specialty:  Cardiology Why:  office will contact you to see Dr Doug Sou PA or NP in 1-2 weeks Contact information: [address] [place] [address] [phone]           Discharge Medications:  Allergies as of 12/13/2016      Reactions   Bee Venom Swelling      Medication List    STOP taking these medications   aspirin-acetaminophen-caffeine 250-250-65 MG tablet Commonly known as:  EXCEDRIN MIGRAINE   esomeprazole 20 MG capsule Commonly known as:  [place] by:  pantoprazole 40 MG tablet   testosterone 50 MG/5GM (1%) Gel Commonly known as:  ANDROGEL     TAKE these medications   acetaminophen 325 MG tablet Commonly known as:  TYLENOL Take 2 tablets (650 mg total) by mouth every 4 (four) hours as needed for headache or mild pain.   acyclovir ointment 5 % Commonly known as:  ZOVIRAX Apply 1 application topically  daily.   allopurinol 300 MG tablet Commonly known as:  ZYLOPRIM Take 450 mg by mouth daily   amLODipine 5 MG tablet Commonly known as:  NORVASC Take 5 mg by mouth daily.   ARTIFICIAL TEARS OP Place 1 drop into both eyes as needed (for dry eyes).   aspirin 81 MG EC tablet Take 1 tablet (81 mg total) by mouth daily.   atorvastatin 80 MG tablet Commonly known as:  LIPITOR Take 1 tablet (80 mg total) by mouth daily at 6 PM.   DULoxetine 60 MG capsule Commonly known as:  CYMBALTA Take 60 mg by mouth at bedtime.   furosemide 20 MG tablet Commonly known as:  LASIX Take 20 mg by mouth daily.   hydroxychloroquine 200 MG tablet Commonly known as:  PLAQUENIL TAKE 200 MG BY MOUTH TWICE DAILY   levothyroxine 100 MCG tablet Commonly known as:  SYNTHROID, LEVOTHROID Take 100 mcg by mouth at bedtime.   MULTIVITAMIN PO Take 1 tablet by mouth daily.   nitroGLYCERIN 0.4 MG SL tablet Commonly known as:  NITROSTAT Place 1 tablet (0.4 mg total) under the tongue every 5 (five) minutes as needed for chest pain.   ondansetron 4 MG tablet Commonly known as:  ZOFRAN Take 1 tablet (4 mg total) by mouth every 8 (eight) hours as needed for nausea or vomiting.   pantoprazole 40 MG tablet Commonly known as:  PROTONIX Take 1 tablet (40 mg total) by mouth daily. Replaces:  esomeprazole 20 MG capsule   PROBIOTIC PO Take 1 capsule by mouth daily.   propranolol 60 MG tablet Commonly known as:  INDERAL Take 60 mg by mouth 2 (two) times daily.   ticagrelor 90 MG Tabs tablet Commonly known as:  BRILINTA Take 1 tablet (90 mg total) by mouth 2 (two) times daily.        Duration of Discharge Encounter: Greater than 30 minutes including physician time.  Angelena Form PA-C 12/13/2016 8:33 AM

## 2016-12-15 ENCOUNTER — Encounter (HOSPITAL_COMMUNITY): Payer: Self-pay | Admitting: Cardiology

## 2017-01-04 NOTE — Progress Notes (Signed)
Cardiology Office Note    Date:  01/05/2017   ID:  Dennis Moon, DOB 12-22-1945, MRN [id number]  PCP:  Karleen Hampshire., MD  Cardiologist: Dr. Martinique  Chief Complaint  Patient presents with  . Follow-up    occassional shortness of breath, no chest pain, cramping in legs, edema, or palpitations    History of Present Illness:    Dennis Moon is a 71 y.o. male with past medical history of HTN, HLD, Stage 3 CKD and Lupus who presents to the office today for 2-week hospital follow-up.   He was recently admitted to [facility] from 7/26 - 12/13/2016 for evaluation of chest pain. He reported having intermittent episodes of chest pain for the past several days.  EKG was without acute changes but his troponin values peaked at 0.40. A cardiac catheterization was performed which showed 2-vessel obstructive CAD with 90% Prox-LAD stenosis and 80% Prox Cx stenosis. PCI was performed with placement of DES to both the LAD and LCx. He was started on DAPT with ASA and Brilinta along with BB and statin therapy.   In talking with the patient today, he reports overall doing well since his recent hospitalization. He denies any recurrent chest discomfort. Reports breathing is at baseline. No orthopnea, PND, or lower extremity edema. He has noticed mild fatigue over the past few weeks. BP is soft today at 96/64 and he has not checked this at home since his recent hospitalization. 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 No reported complications regarding his radial cath site since returning to work.    Past Medical History:  Diagnosis Date  . CAD S/P percutaneous coronary angioplasty 12/12/2016   a. 11/2016: NSTEMI with DES to LAD and DES to LCx  . Gout   . Hypertension   . Hypothyroidism   . Migraine   . Sjogren's disease ([place])   . Systemic lupus erythematosus ([place])     Past Surgical History:  Procedure Laterality Date  .  CATARACT EXTRACTION, BILATERAL Bilateral 2017  . CORONARY STENT INTERVENTION N/A 12/12/2016   Procedure: Coronary Stent Intervention;  Surgeon: Martinique, Peter M, MD;  Location: [place] [facility];  Service: Cardiovascular;  Laterality: N/A;  . HERNIA REPAIR    . LEFT HEART CATH AND CORONARY ANGIOGRAPHY N/A 12/12/2016   Procedure: Left Heart Cath and Coronary Angiography;  Surgeon: Martinique, Peter M, MD;  Location: [place] [facility];  Service: Cardiovascular;  Laterality: N/A;  . THYROID SURGERY  1997    Current Medications: Outpatient Medications Prior to Visit  Medication Sig Dispense Refill  . acetaminophen (TYLENOL) 325 MG tablet Take 2 tablets (650 mg total) by mouth every 4 (four) hours as needed for headache or mild pain.    [name] acyclovir ointment (ZOVIRAX) 5 % Apply 1 application topically daily.     [name] allopurinol (ZYLOPRIM) 300 MG tablet Take 450 mg by mouth daily 135 tablet 0  . amLODipine (NORVASC) 5 MG tablet Take 2.5 mg by mouth daily.    [name] aspirin EC 81 MG EC tablet Take 1 tablet (81 mg total) by mouth daily.    [name] atorvastatin (LIPITOR) 80 MG tablet Take 1 tablet (80 mg total) by mouth daily at 6 PM. 90 tablet 3  . DULoxetine (CYMBALTA) 60 MG capsule Take 60 mg by mouth at bedtime.     . furosemide (LASIX) 20 MG tablet Take 20 mg by mouth daily.     [name]  hydroxychloroquine (PLAQUENIL) 200 MG tablet TAKE 200 MG BY MOUTH TWICE DAILY 180 tablet 0  . Hypromellose (ARTIFICIAL TEARS OP) Place 1 drop into both eyes as needed (for dry eyes).    [name] levothyroxine (SYNTHROID, LEVOTHROID) 100 MCG tablet Take 100 mcg by mouth at bedtime.     . Multiple Vitamins-Minerals (MULTIVITAMIN PO) Take 1 tablet by mouth daily.    . nitroGLYCERIN (NITROSTAT) 0.4 MG SL tablet Place 1 tablet (0.4 mg total) under the tongue every 5 (five) minutes as needed for chest pain. 25 tablet 3  . ondansetron (ZOFRAN) 4 MG tablet Take 1 tablet (4 mg total) by mouth every 8 (eight) hours as needed for nausea or vomiting. 20  tablet 0  . pantoprazole (PROTONIX) 40 MG tablet Take 1 tablet (40 mg total) by mouth daily. 90 tablet 3  . Probiotic Product (PROBIOTIC PO) Take 1 capsule by mouth daily.    . propranolol (INDERAL) 60 MG tablet Take 60 mg by mouth 2 (two) times daily.     . ticagrelor (BRILINTA) 90 MG TABS tablet Take 1 tablet (90 mg total) by mouth 2 (two) times daily. 180 tablet 3   No facility-administered medications prior to visit.      Allergies:   Bee venom   Social History   Social History  . Marital status: Married    Spouse name: N/A  . Number of children: N/A  . Years of education: N/A   Social History Main Topics  . Smoking status: Former Smoker    Packs/day: 1.00    Years: 6.00    Quit date: 04/25/1966  . Smokeless tobacco: Never Used  . Alcohol use 4.2 oz/week    7 Glasses of wine per week  . Drug use: No  . Sexual activity: Not Asked   Other Topics Concern  . None   Social History Narrative  . None     Family History:  The patient's family history includes Diabetes in his mother; Hypertension in his father and mother.   Review of Systems:   Please see the history of present illness.     General:  No chills, fever, night sweats or weight changes. Positive for fatigue.  Cardiovascular:  No chest pain, dyspnea on exertion, edema, orthopnea, palpitations, paroxysmal nocturnal dyspnea. Dermatological: No rash, lesions/masses Respiratory: No cough, dyspnea Urologic: No hematuria, dysuria Abdominal:   No nausea, vomiting, diarrhea, bright red blood per rectum, melena, or hematemesis Neurologic:  No visual changes, wkns, changes in mental status. All other systems reviewed and are otherwise negative except as noted above.   Physical Exam:    VS:  BP 96/64   Pulse 64   Ht 6' (1.829 m)   Wt 225 lb (102.1 kg)   BMI 30.52 kg/m    General: Well developed, well nourished Caucasian male appearing in no acute distress. Head: Normocephalic, atraumatic, sclera non-icteric, no  xanthomas, nares are without discharge.  Neck: No carotid bruits. JVD not elevated.  Lungs: Respirations regular and unlabored, without wheezes or rales.  Heart: Regular rate and rhythm. No S3 or S4.  No murmur, no rubs, or gallops appreciated. Abdomen: Soft, non-tender, non-distended with normoactive bowel sounds. No hepatomegaly. No rebound/guarding. No obvious abdominal masses. Msk:  Strength and tone appear normal for age. No joint deformities or effusions. Extremities: No clubbing or cyanosis. No lower extremity edema.  Distal pedal pulses are 2+ bilaterally. Right radial cath site without ecchymosis or evidence of a hematoma.  Neuro: Alert and oriented X  3. Moves all extremities spontaneously. No focal deficits noted. Psych:  Responds to questions appropriately with a normal affect. Skin: No rashes or lesions noted  Wt Readings from Last 3 Encounters:  01/05/17 225 lb (102.1 kg)  12/13/16 236 lb 5.3 oz (107.2 kg)  10/10/16 236 lb (107 kg)      Studies/Labs Reviewed:   EKG:  EKG is ordered today.  The ekg ordered today demonstrates NSR, HR 64, with RAD. TWI along V1 and V2 now resolved when compared to prior tracings.   Recent Labs: 12/12/2016: ALT 23; Magnesium 2.2; TSH 6.351 12/13/2016: BUN 14; Creatinine, Ser 0.98; Hemoglobin 14.7; Platelets 201; Potassium 4.1; Sodium 138   Lipid Panel    Component Value Date/Time   CHOL 198 12/12/2016 0330   TRIG 163 (H) 12/12/2016 0330   HDL 47 12/12/2016 0330   CHOLHDL 4.2 12/12/2016 0330   VLDL 33 12/12/2016 0330   LDLCALC 118 (H) 12/12/2016 0330    Additional studies/ records that were reviewed today include:   Echocardiogram: 12/12/2016 Study Conclusions  - Left ventricle: The cavity size was normal. Wall thickness was   increased in a pattern of mild LVH. Systolic function was normal.   The estimated ejection fraction was in the range of 60% to 65%.   Wall motion was normal; there were no regional wall motion    abnormalities. Doppler parameters are consistent with abnormal   left ventricular relaxation (grade 1 diastolic dysfunction). The   E/e&' ratio is <8, suggesting normal LV filling pressure. - Mitral valve: Mildly thickened leaflets . There was trivial   regurgitation. - Left atrium: The atrium was normal in size. - Tricuspid valve: There was trivial regurgitation. - Pulmonic valve: There was mild regurgitation. - Pulmonary arteries: PA peak pressure: 20 mm Hg (S). - Inferior vena cava: The vessel was normal in size. The   respirophasic diameter changes were in the normal range (>= 50%),   consistent with normal central venous pressure.  Impressions:  - LVEF 60-65%, mild LVH, normal wall motion, grade 1 DD, normal LV   filling pressure, normal LA size, trivial MR, TR, RVSP 20 mmHg,   normal IVC.  Cardiac Catheterization: 12/12/2016  Mid LAD lesion, 40 %stenosed.  The left ventricular systolic function is normal.  LV end diastolic pressure is normal.  The left ventricular ejection fraction is 55-65% by visual estimate.  Prox LAD lesion, 90 %stenosed.  A STENT PROMUS PREM MR 4.0X20 drug eluting stent was successfully placed.  Post intervention, there is a 0% residual stenosis.  Ost Cx to Prox Cx lesion, 80 %stenosed.  A STENT XIENCE ALPINE RX 3.0X18 drug eluting stent was successfully placed.  Post intervention, there is a 0% residual stenosis.   1. 2 vessel obstructive CAD 2. Normal LV function 3. Normal LVEDP 4. Successful stenting of the proximal LAD with DES 5. Successful stenting of the proximal LCx with DES.  Plan: DAPT for one year. Anticipate DC in am.    Assessment:    1. Non-ST elevation myocardial infarction (NSTEMI), subsequent episode of care ([place])   2. Coronary artery disease involving native coronary artery of native heart without angina pectoris   3. Essential hypertension   4. Hyperlipidemia LDL goal <70   5. Medication management   6. CKD  (chronic kidney disease) stage 3, GFR 30-59 ml/min      Plan:   In order of problems listed above:  1. Subsequent Episode of Care for NSTEMI/ CAD - recently admitted from  7/26 - 12/13/2016 for an NSTEMI with peak troponin values of 0.40. Cath showed 2-vessel obstructive CAD with 90% Prox-LAD stenosis and 80% Prox Cx stenosis. PCI was performed with placement of DES to both the LAD and LCx. Echo showed a preserved EF of 60-65% with no regional WMA.  - He denies any recurrent chest pain or dyspnea on exertion. He has returned to work and is also climbing stairs multiple times per day without any anginal symptoms. Radial cath site appears well-healing.  - continue on DAPT with ASA and Brilinta along with BB and statin therapy. Recheck FLP and LFT's.   2. HTN - BP is soft at 96/64 during today's visit with reported fatigue.  - currently on Amlodipine 5mg  daily, Lasix 20mg  daily, and Propranolol 60mg  BID (on this specific BB for Migraine headaches).  - will reduce Amlodipine to 2.5mg  daily. I encouraged him to check his BP and report back with the results.   3. HLD - Lipid Panel during recent admission showed total cholesterol of 198, HDL 47, and LDL 118. Goal LDL is < 70 with known CAD.  - remains on Atorvastatin 80mg  daily. Will recheck FLP/LFT's within the next 2 weeks as he is not fasting today.   4. Stage 3 CKD - creatinine stable at 0.98 when last checked on 12/13/2016.    Medication Adjustments/Labs and Tests Ordered: Current medicines are reviewed at length with the patient today.  Concerns regarding medicines are outlined above.  Medication changes, Labs and Tests ordered today are listed in the Patient Instructions below. Patient Instructions  Medication Instructions:  DECREASE AMLODIPINE TO 2.5MG  DAILY If you need a refill on your cardiac medications before your next appointment, please call your pharmacy.  Labwork: FASTING LIPID PANEL AND LFT IN 2-3 WEEKS HERE IN OUR OFFICE AT  [facility]  Follow-Up: Your physician wants you to follow-up in: 3 MONTHS WITH DR Martinique.  Thank you for choosing [facility] at [employer], [place], [place]  01/05/2017 9:16 AM    [address] [place], [address], [place]  [address] Phone: [phone]; Fax: [phone]  [address], [place],  [address] Phone: [phone]

## 2017-01-05 ENCOUNTER — Encounter: Payer: Self-pay | Admitting: Student

## 2017-01-05 ENCOUNTER — Ambulatory Visit (INDEPENDENT_AMBULATORY_CARE_PROVIDER_SITE_OTHER): Payer: PPO | Admitting: Student

## 2017-01-05 VITALS — BP 96/64 | HR 64 | Ht 72.0 in | Wt 225.0 lb

## 2017-01-05 DIAGNOSIS — E785 Hyperlipidemia, unspecified: Secondary | ICD-10-CM

## 2017-01-05 DIAGNOSIS — I10 Essential (primary) hypertension: Secondary | ICD-10-CM | POA: Diagnosis not present

## 2017-01-05 DIAGNOSIS — N183 Chronic kidney disease, stage 3 unspecified: Secondary | ICD-10-CM

## 2017-01-05 DIAGNOSIS — Z79899 Other long term (current) drug therapy: Secondary | ICD-10-CM

## 2017-01-05 DIAGNOSIS — I2510 Atherosclerotic heart disease of native coronary artery without angina pectoris: Secondary | ICD-10-CM

## 2017-01-05 DIAGNOSIS — I214 Non-ST elevation (NSTEMI) myocardial infarction: Secondary | ICD-10-CM

## 2017-01-05 NOTE — Patient Instructions (Signed)
Medication Instructions:  DECREASE AMLODIPINE TO 2.5MG  DAILY If you need a refill on your cardiac medications before your next appointment, please call your pharmacy.  Labwork: FASTING LIPID PANEL AND LFT IN 2-3 WEEKS HERE IN OUR OFFICE AT [facility]  Follow-Up: Your physician wants you to follow-up in: 3 MONTHS WITH DR Martinique.  Thank you for choosing [facility] at [facility]!!

## 2017-01-23 DIAGNOSIS — I214 Non-ST elevation (NSTEMI) myocardial infarction: Secondary | ICD-10-CM | POA: Diagnosis not present

## 2017-01-23 DIAGNOSIS — Z79899 Other long term (current) drug therapy: Secondary | ICD-10-CM | POA: Diagnosis not present

## 2017-01-24 LAB — LIPID PANEL
Chol/HDL Ratio: 2.7 ratio (ref 0.0–5.0)
Cholesterol, Total: 115 mg/dL (ref 100–199)
HDL: 42 mg/dL (ref >39)
LDL Calculated: 44 mg/dL (ref 0–99)
Triglycerides: 143 mg/dL (ref 0–149)
VLDL Cholesterol Cal: 29 mg/dL (ref 5–40)

## 2017-01-24 LAB — HEPATIC FUNCTION PANEL
ALBUMIN: 4.3 g/dL (ref 3.5–4.8)
ALK PHOS: 85 IU/L (ref 39–117)
ALT: 30 IU/L (ref 0–44)
AST: 25 IU/L (ref 0–40)
BILIRUBIN TOTAL: 0.4 mg/dL (ref 0.0–1.2)
Bilirubin, Direct: 0.12 mg/dL (ref 0.00–0.40)
TOTAL PROTEIN: 7.3 g/dL (ref 6.0–8.5)

## 2017-02-03 ENCOUNTER — Other Ambulatory Visit: Payer: Self-pay | Admitting: Rheumatology

## 2017-02-04 NOTE — Telephone Encounter (Signed)
This medication was most recently prescribed by Kerin Ransom, PA-C We last prescribed the medication in August 2017.  Last Visit: 10/10/16 Next Visit: 03/13/17 Labs: 12/13/16 WNL  Okay to refill Allopurinol?

## 2017-02-04 NOTE — Telephone Encounter (Signed)
ok 

## 2017-03-01 NOTE — Progress Notes (Signed)
Office Visit Note  Patient: Dennis Moon             Date of Birth: January 05, 1946           MRN: [id number]             PCP: Dennis Hampshire., MD Referring: Dennis Hampshire., MD Visit Date: 03/13/2017 Occupation: @GUAROCC @    Subjective:  sicca   History of Present Illness: Dennis Moon is a 71 y.o. male with history of gout, subacute lupus and Sjogren's. He has not had any gout flare since the last visit. He denies any rash. He continues to have sicca symptoms and has been seeing her ophthalmologist. He is been having some stiffness in his bilateral hands and his lower back. He had to come off NSAIDs due to Brilinta which was given after the stent placement.  Activities of Daily Living:  Patient reports morning stiffness for 1 hour.   Patient Denies nocturnal pain.  Difficulty dressing/grooming: Denies Difficulty climbing stairs: Denies Difficulty getting out of chair: Denies Difficulty using hands for taps, buttons, cutlery, and/or writing: Reports   Review of Systems  Constitutional: Negative.  Negative for fatigue, night sweats and weakness ( ).  HENT: Positive for mouth dryness. Negative for mouth sores and nose dryness.   Eyes: Positive for dryness. Negative for redness.  Respiratory: Negative.  Negative for shortness of breath and difficulty breathing.   Cardiovascular: Negative.  Negative for chest pain, palpitations, hypertension, irregular heartbeat and swelling in legs/feet.  Gastrointestinal: Negative.  Negative for constipation and diarrhea.  Endocrine: Negative for increased urination.  Musculoskeletal: Positive for arthralgias, joint pain and morning stiffness. Negative for joint swelling, myalgias, muscle weakness, muscle tenderness and myalgias.  Skin: Negative.  Negative for color change, rash, hair loss, nodules/bumps, skin tightness, ulcers and sensitivity to sunlight.  Allergic/Immunologic: Negative for susceptible to infections.  Neurological: Negative for  dizziness, fainting, numbness, memory loss and night sweats.  Hematological: Negative for swollen glands.  Psychiatric/Behavioral: Negative.  Negative for depressed mood and sleep disturbance. The patient is not nervous/anxious.     PMFS History:  Patient Active Problem List   Diagnosis Date Noted  . Obesity (BMI 30.0-34.9) 03/12/2017  . CAD S/P percutaneous coronary angioplasty 12/13/2016  . NSTEMI (non-ST elevated myocardial infarction) ([place]) 12/11/2016  . Gout of multiple sites 08/26/2016  . Chronic renal impairment 08/26/2016  . Chronic kidney disease 08/26/2016  . Sjogren's syndrome ([place]) 04/23/2016  . 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 Idiopathic chronic gout, unspecified site, without tophus (tophi) 04/23/2016  . High risk medication use 04/23/2016  . Primary osteoarthritis of both hands 04/23/2016  . DDD (degenerative disc disease), lumbar 04/23/2016  . Kidney disease 04/23/2016    Past Medical History:  Diagnosis Date  . CAD S/P percutaneous coronary angioplasty 12/12/2016   a. 11/2016: NSTEMI with DES to LAD and DES to LCx  . Gout   . Hypertension   . Hypothyroidism   . Migraine   . Sjogren's disease ([place])   . Systemic lupus erythematosus (HCC)     Family History  Problem Relation Age of Onset  . Diabetes Mother   . Hypertension Mother   . Hypertension Father    Past Surgical History:  Procedure Laterality Date  . CATARACT EXTRACTION, BILATERAL Bilateral 2017  . CORONARY STENT INTERVENTION N/A 12/12/2016   Procedure: Coronary Stent Intervention;  Surgeon: Martinique, Peter M, MD;  Location: [facility];  Service: Cardiovascular;  Laterality: N/A;  . HERNIA REPAIR    .  LEFT HEART CATH AND CORONARY ANGIOGRAPHY N/A 12/12/2016   Procedure: Left Heart Cath and Coronary Angiography;  Surgeon: Martinique, Peter M, MD;  Location: [facility];  Service: Cardiovascular;  Laterality: N/A;  . THYROID SURGERY  1997   Social History   Social History Narrative  . No  narrative on file     Objective: Vital Signs: BP 106/62 (BP Location: Right Arm, Patient Position: Sitting, Cuff Size: Normal)   Pulse 63   Ht 6' (1.829 m)   Wt 229 lb (103.9 kg)   BMI 31.06 kg/m    Physical Exam  Constitutional: He is oriented to person, place, and time. He appears well-developed and well-nourished.  HENT:  Head: Normocephalic and atraumatic.  Eyes: Pupils are equal, round, and reactive to light. Conjunctivae and EOM are normal.  Neck: Normal range of motion. Neck supple.  Cardiovascular: Normal rate, regular rhythm and normal heart sounds.   Pulmonary/Chest: Effort normal and breath sounds normal.  Abdominal: Soft. Bowel sounds are normal.  Neurological: He is alert and oriented to person, place, and time.  Skin: Skin is warm and dry. Capillary refill takes less than 2 seconds.  Psychiatric: He has a normal mood and affect. His behavior is normal.  Nursing note and vitals reviewed.    Musculoskeletal Exam: C-spine and thoracic good range of motion. Hip discomfort range of motion of his lumbar spine. Shoulder joints elbow joints wrist joints are good range of motion. He has DIP PIP thickening in his hands consistent with osteoarthritis. No synovitis was noted. Hip joints knee joints ankles MTPs PIPs with good range of motion with no synovitis.  CDAI Exam: No CDAI exam completed.    Investigation: No additional findings.PLQ eye exam: 12/2016 03/11/17 CBC normal, RF negative, uric acid 4.2, C3 C4 normal ANA , SPEP pending 11/2016 CMP normal Imaging: No results found.  Speciality Comments: No specialty comments available.    Procedures:  No procedures performed Allergies: Bee venom   Assessment / Plan:     Visit Diagnoses: Idiopathic chronic gout of multiple sites without tophus - allopurinol 450 mg by mouth daily. He has not had any gout flare. His uric acid isn't desirable range.  Subacute cutaneous lupus erythematosus: He has no rash on Plaquenil.  She's been tolerating Plaquenil well.  Sjogren's syndrome with keratoconjunctivitis sicca (HCC) - Positive ANA, positive Ro, positive La. He's been using over-the-counter and topical agents time.  High risk medication use - Plaquenil 200 mg by mouth twice a day.eye exam: 12/2016 was normal. We will continue to monitor labs every 6 months. I will check CBC, CMP, uric acid on return visit.  Primary osteoarthritis of both hands: He has some joint stiffness and discomfort. Head and muscle strengthening exercises were discussed.  DDD (degenerative disc disease), lumbar: Chronic pain  CAD S/P percutaneous coronary angioplasty -patient had recent MI and had a stent placement. He is on on Brilinta now.  Obesity (BMI 30.0-34.9) : Weight loss diet and exercise was discussed.   Orders: No orders of the defined types were placed in this encounter.  No orders of the defined types were placed in this encounter.   Face-to-face time spent with patient was 30 minutes. Greater than 50% of time was spent in counseling and coordination of care.  Follow-Up Instructions: Return in about 6 months (around 09/11/2017) for Gout Sjogrens OA DDD.   Bo Merino, MD  Note - This record has been created using Editor, commissioning.  Chart creation errors have been  sought, but may not always  have been located. Such creation errors do not reflect on  the standard of medical care.

## 2017-03-11 ENCOUNTER — Telehealth: Payer: Self-pay

## 2017-03-11 DIAGNOSIS — M2550 Pain in unspecified joint: Secondary | ICD-10-CM

## 2017-03-11 DIAGNOSIS — Z79899 Other long term (current) drug therapy: Secondary | ICD-10-CM

## 2017-03-11 NOTE — Addendum Note (Signed)
Addended by: Carole Binning on: 03/11/2017 12:21 PM   Modules accepted: Orders

## 2017-03-11 NOTE — Telephone Encounter (Signed)
Patient would like lab orders sent to [facility] at [place].[address].  [phone].  Please Advise.  Thank you.

## 2017-03-12 DIAGNOSIS — E669 Obesity, unspecified: Secondary | ICD-10-CM | POA: Insufficient documentation

## 2017-03-13 ENCOUNTER — Encounter: Payer: Self-pay | Admitting: Rheumatology

## 2017-03-13 ENCOUNTER — Ambulatory Visit (INDEPENDENT_AMBULATORY_CARE_PROVIDER_SITE_OTHER): Payer: PPO | Admitting: Rheumatology

## 2017-03-13 VITALS — BP 106/62 | HR 63 | Ht 72.0 in | Wt 229.0 lb

## 2017-03-13 DIAGNOSIS — E669 Obesity, unspecified: Secondary | ICD-10-CM | POA: Diagnosis not present

## 2017-03-13 DIAGNOSIS — Z9861 Coronary angioplasty status: Secondary | ICD-10-CM

## 2017-03-13 DIAGNOSIS — M1A09X0 Idiopathic chronic gout, multiple sites, without tophus (tophi): Secondary | ICD-10-CM | POA: Diagnosis not present

## 2017-03-13 DIAGNOSIS — M19041 Primary osteoarthritis, right hand: Secondary | ICD-10-CM | POA: Diagnosis not present

## 2017-03-13 DIAGNOSIS — M19042 Primary osteoarthritis, left hand: Secondary | ICD-10-CM

## 2017-03-13 DIAGNOSIS — M3501 Sicca syndrome with keratoconjunctivitis: Secondary | ICD-10-CM | POA: Diagnosis not present

## 2017-03-13 DIAGNOSIS — L931 Subacute cutaneous lupus erythematosus: Secondary | ICD-10-CM

## 2017-03-13 DIAGNOSIS — M5136 Other intervertebral disc degeneration, lumbar region: Secondary | ICD-10-CM | POA: Diagnosis not present

## 2017-03-13 DIAGNOSIS — Z79899 Other long term (current) drug therapy: Secondary | ICD-10-CM

## 2017-03-13 DIAGNOSIS — I2510 Atherosclerotic heart disease of native coronary artery without angina pectoris: Secondary | ICD-10-CM | POA: Diagnosis not present

## 2017-03-14 LAB — PROTEIN ELECTROPHORESIS, SERUM, WITH REFLEX
A/G RATIO SPE: 1 (ref 0.7–1.7)
ALPHA 1: 0.3 g/dL (ref 0.0–0.4)
Albumin ELP: 3.7 g/dL (ref 2.9–4.4)
Alpha 2: 0.9 g/dL (ref 0.4–1.0)
BETA: 1.1 g/dL (ref 0.7–1.3)
GAMMA GLOBULIN: 1.6 g/dL (ref 0.4–1.8)
Globulin, Total: 3.8 g/dL (ref 2.2–3.9)
TOTAL PROTEIN: 7.5 g/dL (ref 6.0–8.5)

## 2017-03-14 LAB — CBC WITH DIFFERENTIAL/PLATELET
BASOS: 0 %
Basophils Absolute: 0 10*3/uL (ref 0.0–0.2)
EOS (ABSOLUTE): 0.2 10*3/uL (ref 0.0–0.4)
EOS: 3 %
HEMATOCRIT: 43.7 % (ref 37.5–51.0)
Hemoglobin: 14.5 g/dL (ref 13.0–17.7)
IMMATURE GRANS (ABS): 0 10*3/uL (ref 0.0–0.1)
IMMATURE GRANULOCYTES: 0 %
LYMPHS: 23 %
Lymphocytes Absolute: 1.5 10*3/uL (ref 0.7–3.1)
MCH: 31.7 pg (ref 26.6–33.0)
MCHC: 33.2 g/dL (ref 31.5–35.7)
MCV: 96 fL (ref 79–97)
Monocytes Absolute: 0.6 10*3/uL (ref 0.1–0.9)
Monocytes: 8 %
NEUTROS ABS: 4.5 10*3/uL (ref 1.4–7.0)
Neutrophils: 66 %
PLATELETS: 251 10*3/uL (ref 150–379)
RBC: 4.57 x10E6/uL (ref 4.14–5.80)
RDW: 15.5 % — ABNORMAL HIGH (ref 12.3–15.4)
WBC: 6.8 10*3/uL (ref 3.4–10.8)

## 2017-03-14 LAB — ANA: Anti Nuclear Antibody(ANA): POSITIVE — AB

## 2017-03-14 LAB — URIC ACID: Uric Acid: 4.2 mg/dL (ref 3.7–8.6)

## 2017-03-14 LAB — RHEUMATOID FACTOR: RHEUMATOID FACTOR: 10.2 [IU]/mL (ref 0.0–13.9)

## 2017-03-14 LAB — C3 AND C4
COMPLEMENT C3, SERUM: 160 mg/dL (ref 82–167)
COMPLEMENT C4, SERUM: 30 mg/dL (ref 14–44)

## 2017-03-30 ENCOUNTER — Encounter: Payer: Self-pay | Admitting: Cardiology

## 2017-04-09 NOTE — Progress Notes (Signed)
Cardiology Office Note    Date:  04/13/2017   ID:  Dennis Moon, DOB October 25, 1945, MRN [id number]  PCP:  Karleen Hampshire., MD  Cardiologist: Dr. Martinique  Chief Complaint  Patient presents with  . Coronary Artery Disease    History of Present Illness:    Dennis Moon is a 71 y.o. male with past medical history of HTN, HLD, Stage 3 CKD and Lupus who is seen for follow up CAD.  He was  admitted to [facility] from 7/26 - 12/13/2016 for evaluation of chest pain.   EKG was without acute changes but his troponin values peaked at 0.40. A cardiac catheterization was performed which showed 2-vessel obstructive CAD with 90% Prox-LAD stenosis and 80% Prox Cx stenosis. PCI was performed with placement of DES to both the LAD and LCx. He was started on DAPT with ASA and Brilinta along with BB and statin therapy.   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   On follow up today he is doing very well. Denies any chest pain, dyspnea, palpitations, or edema. Notes occasional heartburn and takes Gaviscon with relief. Tries to make it to the gym 2-3 x/ week. Has a bad back and shoulder which limits his golf and tennis.     Past Medical History:  Diagnosis Date  . CAD S/P percutaneous coronary angioplasty 12/12/2016   a. 11/2016: NSTEMI with DES to LAD and DES to LCx  . Gout   . Hypertension   . Hypothyroidism   . Migraine   . Sjogren's disease ([place])   . Systemic lupus erythematosus ([place])     Past Surgical History:  Procedure Laterality Date  . CATARACT EXTRACTION, BILATERAL Bilateral 2017  . CORONARY STENT INTERVENTION N/A 12/12/2016   Procedure: Coronary Stent Intervention;  Surgeon: Martinique, Shetara Launer M, MD;  Location: [facility];  Service: Cardiovascular;  Laterality: N/A;  . HERNIA REPAIR    . LEFT HEART CATH AND CORONARY ANGIOGRAPHY N/A 12/12/2016   Procedure: Left Heart Cath and Coronary Angiography;  Surgeon: Martinique, Tresean Mattix M, MD;  Location: [facility];  Service: Cardiovascular;  Laterality: N/A;  . THYROID SURGERY  1997    Current Medications: Outpatient Medications Prior to Visit  Medication Sig Dispense Refill  . acetaminophen (TYLENOL) 325 MG tablet Take 2 tablets (650 mg total) by mouth every 4 (four) hours as needed for headache or mild pain.    [name] acyclovir ointment (ZOVIRAX) 5 % Apply 1 application topically daily.     [name] allopurinol (ZYLOPRIM) 300 MG tablet TAKE 1&1/2 TABLET BY MOUTH DAILY 135 tablet 0  . amLODipine (NORVASC) 5 MG tablet Take 2.5 mg by mouth daily.    [name] aspirin EC 81 MG EC tablet Take 1 tablet (81 mg total) by mouth daily.    [name] atorvastatin (LIPITOR) 80 MG tablet Take 1 tablet (80 mg total) by mouth daily at 6 PM. 90 tablet 3  . DULoxetine (CYMBALTA) 60 MG capsule Take 60 mg by mouth daily.  0  . furosemide (LASIX) 20 MG tablet Take 20 mg by mouth daily.     . hydroxychloroquine (PLAQUENIL) 200 MG tablet TAKE 200 MG BY MOUTH TWICE DAILY 180 tablet 0  . Hypromellose (ARTIFICIAL TEARS OP) Place 1 drop into both eyes as needed (for dry eyes).    [name] levothyroxine (SYNTHROID, LEVOTHROID) 100 MCG tablet Take 100 mcg by mouth at bedtime.     . Multiple Vitamins-Minerals (  MULTIVITAMIN PO) Take 1 tablet by mouth daily.    . nitroGLYCERIN (NITROSTAT) 0.4 MG SL tablet Place 1 tablet (0.4 mg total) under the tongue every 5 (five) minutes as needed for chest pain. 25 tablet 3  . ondansetron (ZOFRAN) 4 MG tablet Take 1 tablet (4 mg total) by mouth every 8 (eight) hours as needed for nausea or vomiting. 20 tablet 0  . pantoprazole (PROTONIX) 40 MG tablet Take 1 tablet (40 mg total) by mouth daily. 90 tablet 3  . Probiotic Product (PROBIOTIC PO) Take 1 capsule by mouth daily.    . propranolol (INDERAL) 60 MG tablet Take 60 mg by mouth 2 (two) times daily.     [name] testosterone (ANDROGEL) 50 MG/5GM (1%) GEL Apply 1 g topically daily.  3  . ticagrelor (BRILINTA) 90 MG TABS tablet Take 1 tablet (90 mg total) by mouth 2  (two) times daily. 180 tablet 3  . allopurinol (ZYLOPRIM) 300 MG tablet Take 450 mg by mouth daily 135 tablet 0  . DULoxetine (CYMBALTA) 60 MG capsule Take 60 mg by mouth at bedtime.      No facility-administered medications prior to visit.      Allergies:   Bee venom   Social History   Socioeconomic History  . Marital status: Married    Spouse name: None  . Number of children: None  . Years of education: None  . Highest education level: None  Social Needs  . Financial resource strain: None  . Food insecurity - worry: None  . Food insecurity - inability: None  . Transportation needs - medical: None  . Transportation needs - non-medical: None  Occupational History  . None  Tobacco Use  . Smoking status: Former Smoker    Packs/day: 1.00    Years: 6.00    Pack years: 6.00    Last attempt to quit: 04/25/1966    Years since quitting: 51.0  . Smokeless tobacco: Never Used  Substance and Sexual Activity  . Alcohol use: Yes    Alcohol/week: 4.2 oz    Types: 7 Glasses of wine per week  . Drug use: No  . Sexual activity: None  Other Topics Concern  . None  Social History Narrative  . None     Family History:  The patient's family history includes Diabetes in his mother; Hypertension in his father and mother.   Review of Systems:   Please see the history of present illness.     All other systems reviewed and are otherwise negative except as noted above.   Physical Exam:    VS:  BP 120/63   Pulse 62   Ht 6' (1.829 m)   Wt 229 lb 3.2 oz (104 kg)   SpO2 91%   BMI 31.09 kg/m    GENERAL:  Well appearing HEENT:  PERRL, EOMI, sclera are clear. Oropharynx is clear. NECK:  No jugular venous distention, carotid upstroke brisk and symmetric, no bruits, no thyromegaly or adenopathy LUNGS:  Clear to auscultation bilaterally CHEST:  Unremarkable HEART:  RRR,  PMI not displaced or sustained,S1 and S2 within normal limits, no S3, no S4: no clicks, no rubs, no murmurs ABD:   Soft, nontender. BS +, no masses or bruits. No hepatomegaly, no splenomegaly EXT:  2 + pulses throughout, no edema, no cyanosis no clubbing SKIN:  Warm and dry.  No rashes NEURO:  Alert and oriented x 3. Cranial nerves II through XII intact. PSYCH:  Cognitively intact    Wt Readings  from Last 3 Encounters:  04/13/17 229 lb 3.2 oz (104 kg)  03/13/17 229 lb (103.9 kg)  01/05/17 225 lb (102.1 kg)      Studies/Labs Reviewed:   EKG:  EKG is not ordered today.    Recent Labs: 12/12/2016: Magnesium 2.2; TSH 6.351 12/13/2016: BUN 14; Creatinine, Ser 0.98; Potassium 4.1; Sodium 138 01/23/2017: ALT 30 03/11/2017: Hemoglobin 14.5; Platelets 251   Lipid Panel    Component Value Date/Time   CHOL 115 01/23/2017 0922   TRIG 143 01/23/2017 0922   HDL 42 01/23/2017 0922   CHOLHDL 2.7 01/23/2017 0922   CHOLHDL 4.2 12/12/2016 0330   VLDL 33 12/12/2016 0330   LDLCALC 44 01/23/2017 0922    Additional studies/ records that were reviewed today include:   Echocardiogram: 12/12/2016 Study Conclusions  - Left ventricle: The cavity size was normal. Wall thickness was   increased in a pattern of mild LVH. Systolic function was normal.   The estimated ejection fraction was in the range of 60% to 65%.   Wall motion was normal; there were no regional wall motion   abnormalities. Doppler parameters are consistent with abnormal   left ventricular relaxation (grade 1 diastolic dysfunction). The   E/e&' ratio is <8, suggesting normal LV filling pressure. - Mitral valve: Mildly thickened leaflets . There was trivial   regurgitation. - Left atrium: The atrium was normal in size. - Tricuspid valve: There was trivial regurgitation. - Pulmonic valve: There was mild regurgitation. - Pulmonary arteries: PA peak pressure: 20 mm Hg (S). - Inferior vena cava: The vessel was normal in size. The   respirophasic diameter changes were in the normal range (>= 50%),   consistent with normal central venous  pressure.  Impressions:  - LVEF 60-65%, mild LVH, normal wall motion, grade 1 DD, normal LV   filling pressure, normal LA size, trivial MR, TR, RVSP 20 mmHg,   normal IVC.  Cardiac Catheterization: 12/12/2016  Mid LAD lesion, 40 %stenosed.  The left ventricular systolic function is normal.  LV end diastolic pressure is normal.  The left ventricular ejection fraction is 55-65% by visual estimate.  Prox LAD lesion, 90 %stenosed.  A STENT PROMUS PREM MR 4.0X20 drug eluting stent was successfully placed.  Post intervention, there is a 0% residual stenosis.  Ost Cx to Prox Cx lesion, 80 %stenosed.  A STENT XIENCE ALPINE RX 3.0X18 drug eluting stent was successfully placed.  Post intervention, there is a 0% residual stenosis.   1. 2 vessel obstructive CAD 2. Normal LV function 3. Normal LVEDP 4. Successful stenting of the proximal LAD with DES 5. Successful stenting of the proximal LCx with DES.  Plan: DAPT for one year. Anticipate DC in am.    Assessment:    1. Coronary artery disease involving native coronary artery of native heart without angina pectoris   2. Essential hypertension   3. Hyperlipidemia LDL goal <70   4. CKD (chronic kidney disease) stage 3, GFR 30-59 ml/min (HCC)      Plan:   In order of problems listed above:  1. CAD s/p NSTEMI - Admitted 7/26 - 12/13/2016 for an NSTEMI.  PCI was performed with placement of DES to both the LAD and LCx. Echo showed a preserved EF of 60-65% with no regional WMA.  - He is asymptomatic. - continue on DAPT with ASA and Brilinta for one year along with BB and statin therapy. - I will follow up in 6 months.  2. HTN - well controlled  on low dose amlodipine and propranolol.   3. HLD - well controlled on high dose statin. LDL at goal. 44.   4. Stage 3 CKD - creatinine stable at 0.98. Followed by Dr. Joelyn Oms.   Signed, Davieon Stockham Martinique, MD  04/13/2017 8:02 AM    [facility] [address] [place], [place] [address] Phone: [phone]

## 2017-04-13 ENCOUNTER — Ambulatory Visit: Payer: PPO | Admitting: Cardiology

## 2017-04-13 ENCOUNTER — Encounter: Payer: Self-pay | Admitting: Cardiology

## 2017-04-13 VITALS — BP 120/63 | HR 62 | Ht 72.0 in | Wt 229.2 lb

## 2017-04-13 DIAGNOSIS — I2510 Atherosclerotic heart disease of native coronary artery without angina pectoris: Secondary | ICD-10-CM

## 2017-04-13 DIAGNOSIS — E785 Hyperlipidemia, unspecified: Secondary | ICD-10-CM | POA: Diagnosis not present

## 2017-04-13 DIAGNOSIS — N183 Chronic kidney disease, stage 3 unspecified: Secondary | ICD-10-CM

## 2017-04-13 DIAGNOSIS — I10 Essential (primary) hypertension: Secondary | ICD-10-CM | POA: Diagnosis not present

## 2017-04-13 NOTE — Patient Instructions (Signed)
Continue your current therapy  I will see you in 6 months.   

## 2017-04-17 ENCOUNTER — Encounter: Payer: Self-pay | Admitting: Family Medicine

## 2017-04-17 ENCOUNTER — Ambulatory Visit (INDEPENDENT_AMBULATORY_CARE_PROVIDER_SITE_OTHER): Payer: PPO | Admitting: Family Medicine

## 2017-04-17 VITALS — BP 118/78 | HR 77 | Temp 98.2°F | Ht 72.0 in | Wt 232.1 lb

## 2017-04-17 DIAGNOSIS — N182 Chronic kidney disease, stage 2 (mild): Secondary | ICD-10-CM | POA: Diagnosis not present

## 2017-04-17 DIAGNOSIS — I2510 Atherosclerotic heart disease of native coronary artery without angina pectoris: Secondary | ICD-10-CM

## 2017-04-17 DIAGNOSIS — M1A09X0 Idiopathic chronic gout, multiple sites, without tophus (tophi): Secondary | ICD-10-CM

## 2017-04-17 DIAGNOSIS — L931 Subacute cutaneous lupus erythematosus: Secondary | ICD-10-CM

## 2017-04-17 DIAGNOSIS — E291 Testicular hypofunction: Secondary | ICD-10-CM | POA: Diagnosis not present

## 2017-04-17 DIAGNOSIS — M3501 Sicca syndrome with keratoconjunctivitis: Secondary | ICD-10-CM | POA: Diagnosis not present

## 2017-04-17 DIAGNOSIS — Z9861 Coronary angioplasty status: Secondary | ICD-10-CM

## 2017-04-17 NOTE — Progress Notes (Addendum)
Subjective:  Patient ID: Dennis Moon, male    DOB: September 12, 1945  Age: 71 y.o. MRN: [id number]  CC: Establish Care   HPI Dennis Moon presents for establishment of care.  He was previous previously seen at [facility].  He is a Pharmacist, community and still working full-time.  He is status post non-STEMI back in July of this year.  He is done quite well since this event.  His past medical history is noted.  Last colonoscopy was 2 years ago and showed one polyp.  He has colonoscopy every 5 years.  He is up-to-date on all of his immunizations.  Recent blood work to include CBC, CMP, lipid profile, TSH, PSA, and urinalysis were all normal.  He does not smoke and drinks 1 glass of wine daily.  He continues to see cardiology, urology, rheumatology.  History Dennis Moon has a past medical history of CAD S/P percutaneous coronary angioplasty (12/12/2016), Gout, Hypertension, Hypothyroidism, Migraine, Sjogren's disease ([place]), and Systemic lupus erythematosus ([place]).   He has a past surgical history that includes Thyroid surgery (1997); Hernia repair; Cataract extraction, bilateral (Bilateral, 2017); LEFT HEART CATH AND CORONARY ANGIOGRAPHY (N/A, 12/12/2016); and CORONARY STENT INTERVENTION (N/A, 12/12/2016).   His family history includes Diabetes in his mother; Hypertension in his father and mother.He reports that he quit smoking about 51 years ago. He has a 6.00 pack-year smoking history. he has never used smokeless tobacco. He reports that he drinks about 4.2 oz of alcohol per week. He reports that he does not use drugs.  Outpatient Medications Prior to Visit  Medication Sig Dispense Refill  . acetaminophen (TYLENOL) 325 MG tablet Take 2 tablets (650 mg total) by mouth every 4 (four) hours as needed for headache or mild pain.    [name] acyclovir ointment (ZOVIRAX) 5 % Apply 1 application topically daily.     [name] allopurinol (ZYLOPRIM) 300 MG tablet TAKE 1&1/2 TABLET BY MOUTH DAILY 135 tablet 0  . amLODipine (NORVASC) 5 MG  tablet Take 2.5 mg by mouth daily.    [name] aspirin EC 81 MG EC tablet Take 1 tablet (81 mg total) by mouth daily.    [name] atorvastatin (LIPITOR) 80 MG tablet Take 1 tablet (80 mg total) by mouth daily at 6 PM. 90 tablet 3  . hydroxychloroquine (PLAQUENIL) 200 MG tablet TAKE 200 MG BY MOUTH TWICE DAILY 180 tablet 0  . Hypromellose (ARTIFICIAL TEARS OP) Place 1 drop into both eyes as needed (for dry eyes).    [name] levothyroxine (SYNTHROID, LEVOTHROID) 100 MCG tablet Take 100 mcg by mouth at bedtime.     . Multiple Vitamins-Minerals (MULTIVITAMIN PO) Take 1 tablet by mouth daily.    . nitroGLYCERIN (NITROSTAT) 0.4 MG SL tablet Place 1 tablet (0.4 mg total) under the tongue every 5 (five) minutes as needed for chest pain. 25 tablet 3  . ondansetron (ZOFRAN) 4 MG tablet Take 1 tablet (4 mg total) by mouth every 8 (eight) hours as needed for nausea or vomiting. 20 tablet 0  . pantoprazole (PROTONIX) 40 MG tablet Take 1 tablet (40 mg total) by mouth daily. 90 tablet 3  . Probiotic Product (PROBIOTIC PO) Take 1 capsule by mouth daily.    . ticagrelor (BRILINTA) 90 MG TABS tablet Take 1 tablet (90 mg total) by mouth 2 (two) times daily. 180 tablet 3  . DULoxetine (CYMBALTA) 60 MG capsule Take 60 mg by mouth daily.  0  . furosemide (LASIX) 20 MG tablet Take 20 mg by mouth daily.     [name]  propranolol (INDERAL) 60 MG tablet Take 60 mg by mouth 2 (two) times daily.     [name] testosterone (ANDROGEL) 50 MG/5GM (1%) GEL Apply 1 g topically daily.  3   No facility-administered medications prior to visit.     ROS Review of Systems  Constitutional: Negative.   HENT: Negative.   Eyes: Negative.   Respiratory: Negative.   Cardiovascular: Negative.   Gastrointestinal: Negative.  Negative for anal bleeding and blood in stool.  Endocrine: Negative for polyphagia and polyuria.  Genitourinary: Negative for frequency, hematuria and urgency.  Musculoskeletal: Negative for gait problem and myalgias.  Skin: Negative for color  change and pallor.  Allergic/Immunologic: Negative for immunocompromised state.  Neurological: Negative for weakness and headaches.  Hematological: Negative.   Psychiatric/Behavioral: Negative.     Objective:  BP 118/78 (BP Location: Left Arm, Patient Position: Sitting, Cuff Size: Normal)   Pulse 77   Temp 98.2 F (36.8 C) (Oral)   Ht 6' (1.829 m)   Wt 232 lb 2 oz (105.3 kg)   SpO2 93%   BMI 31.48 kg/m   Physical Exam  Constitutional: He is oriented to person, place, and time. He appears well-developed and well-nourished. No distress.  HENT:  Head: Normocephalic and atraumatic.  Right Ear: External ear normal.  Left Ear: External ear normal.  Mouth/Throat: Oropharynx is clear and moist. No oropharyngeal exudate.  Eyes: Conjunctivae are normal. Right eye exhibits no discharge. Left eye exhibits no discharge. No scleral icterus.  Neck: Neck supple. No JVD present. No tracheal deviation present. No thyromegaly present.  Cardiovascular: Normal rate, regular rhythm and normal heart sounds.  Pulmonary/Chest: Effort normal and breath sounds normal. No stridor.  Abdominal: Soft. Bowel sounds are normal.  Lymphadenopathy:    He has no cervical adenopathy.  Neurological: He is alert and oriented to person, place, and time.  Skin: Skin is warm and dry. He is not diaphoretic.  Psychiatric: He has a normal mood and affect. His behavior is normal.      Assessment & Plan:   Dennis Moon was seen today for establish care.  Diagnoses and all orders for this visit:  CAD S/P percutaneous coronary angioplasty  Sjogren's syndrome with keratoconjunctivitis sicca (HCC)  Idiopathic chronic gout of multiple sites without tophus  Subacute cutaneous lupus erythematosus  Chronic renal impairment, stage 2 (mild)  Androgen deficiency -     testosterone (ANDROGEL) 50 MG/5GM (1%) GEL; Place 1 g onto the skin daily.   I have changed Dennis Moon testosterone. I am also having him maintain  his acyclovir ointment, levothyroxine, amLODipine, Multiple Vitamins-Minerals (MULTIVITAMIN PO), Probiotic Product (PROBIOTIC PO), Hypromellose (ARTIFICIAL TEARS OP), acetaminophen, aspirin, atorvastatin, pantoprazole, hydroxychloroquine, nitroGLYCERIN, ticagrelor, ondansetron, and allopurinol.  Meds ordered this encounter  Medications  . testosterone (ANDROGEL) 50 MG/5GM (1%) GEL    Sig: Place 1 g onto the skin daily.    Dispense:  90 g    Refill:  1     Follow-up: Return if symptoms worsen or fail to improve.  [name], MD

## 2017-04-20 ENCOUNTER — Other Ambulatory Visit: Payer: Self-pay | Admitting: Family Medicine

## 2017-04-20 DIAGNOSIS — Z79899 Other long term (current) drug therapy: Secondary | ICD-10-CM

## 2017-04-20 DIAGNOSIS — E291 Testicular hypofunction: Secondary | ICD-10-CM

## 2017-04-20 NOTE — Telephone Encounter (Signed)
Okay to refill testosterone

## 2017-04-20 NOTE — Telephone Encounter (Signed)
Copied from [place]. Topic: Quick Communication - Rx Refill/Question >> Apr 20, 2017  3:52 PM Scherrie Gerlach wrote: Pt has just established with Dr Ethelene Hal and he has never refilled his meds. Pt request DULoxetine (CYMBALTA) 60 MG capsule furosemide (LASIX) 20 MG tablet propranolol (INDERAL) 60 MG tablet testosterone (ANDROGEL) 50 MG/5GM (1%) GEL [facility] [place] [place] - [address] AT [address] [phone] (Phone) [phone] (Fax)

## 2017-04-21 ENCOUNTER — Encounter: Payer: Self-pay | Admitting: Family Medicine

## 2017-04-21 MED ORDER — PROPRANOLOL HCL 60 MG PO TABS: 60.0000 mg | ORAL_TABLET | Freq: Two times a day (BID) | ORAL | 12 refills | Status: DC

## 2017-04-21 NOTE — Telephone Encounter (Signed)
Refilled the propranolol based on OV 04/17/17 Sending testosterone back to the provider

## 2017-04-21 NOTE — Telephone Encounter (Signed)
Needs to rtc for a testosterone level and a psa.  Recent cbc was okay.

## 2017-04-21 NOTE — Telephone Encounter (Signed)
Message sent to patient

## 2017-04-21 NOTE — Telephone Encounter (Signed)
Denied/advised pharm to ask pt to sched appt with labs to check level ASAP/thx dmf

## 2017-04-21 NOTE — Telephone Encounter (Signed)
Lab orders placed & message sent to patient.

## 2017-04-21 NOTE — Telephone Encounter (Signed)
Lab orders entered - patient just needs to come by the lab before testosterone can be refilled.

## 2017-04-22 ENCOUNTER — Other Ambulatory Visit (INDEPENDENT_AMBULATORY_CARE_PROVIDER_SITE_OTHER): Payer: PPO

## 2017-04-22 DIAGNOSIS — E291 Testicular hypofunction: Secondary | ICD-10-CM | POA: Diagnosis not present

## 2017-04-22 DIAGNOSIS — Z79899 Other long term (current) drug therapy: Secondary | ICD-10-CM | POA: Diagnosis not present

## 2017-04-22 LAB — BASIC METABOLIC PANEL
BUN: 27 mg/dL — AB (ref 6–23)
CALCIUM: 9.4 mg/dL (ref 8.4–10.5)
CHLORIDE: 100 meq/L (ref 96–112)
CO2: 35 mEq/L — ABNORMAL HIGH (ref 19–32)
CREATININE: 1.27 mg/dL (ref 0.40–1.50)
GFR: 59.11 mL/min — AB (ref >60.00)
GLUCOSE: 106 mg/dL — AB (ref 70–99)
POTASSIUM: 4.1 meq/L (ref 3.5–5.1)
Sodium: 144 mEq/L (ref 135–145)

## 2017-04-22 LAB — PSA: PSA: 1.46 ng/mL (ref 0.10–4.00)

## 2017-04-22 LAB — TESTOSTERONE: Testosterone: 539.77 ng/dL (ref 300.00–890.00)

## 2017-04-23 ENCOUNTER — Other Ambulatory Visit: Payer: Self-pay | Admitting: Family Medicine

## 2017-04-23 ENCOUNTER — Other Ambulatory Visit: Payer: Self-pay | Admitting: *Deleted

## 2017-04-23 MED ORDER — TESTOSTERONE 50 MG/5GM (1%) TD GEL: 1.0000 g | Freq: Every day | TRANSDERMAL | 1 refills | Status: DC

## 2017-04-23 MED ORDER — LEVOTHYROXINE SODIUM 100 MCG PO TABS: 100.0000 ug | ORAL_TABLET | Freq: Every day | ORAL | 0 refills | Status: DC

## 2017-04-23 MED ORDER — LEVOTHYROXINE SODIUM 100 MCG PO TABS: 100.0000 ug | ORAL_TABLET | Freq: Every day | ORAL | 1 refills | Status: DC

## 2017-04-23 NOTE — Addendum Note (Signed)
Addended by: Jon Billings on: 04/23/2017 09:14 AM   Modules accepted: Orders, Level of Service

## 2017-04-23 NOTE — Telephone Encounter (Signed)
Copied from [place] [id number]. Topic: General - Other >> Apr 23, 2017 11:03 AM Darl Householder, RMA wrote: Reason for CRM: Medication refill request for Levothyroxine 100 mcg to be sent to [facility]

## 2017-04-23 NOTE — Telephone Encounter (Signed)
Gave pt 1 refill on levothyroxine; last TSH 6.351 on 12/12/16; does pt need another office visit before refills can be granted?

## 2017-04-23 NOTE — Telephone Encounter (Signed)
Patient's TSH was normal in 11/2016. 6 month supply sent to patient's preferred pharmacy.

## 2017-05-18 ENCOUNTER — Other Ambulatory Visit: Payer: Self-pay | Admitting: *Deleted

## 2017-05-18 MED ORDER — HYDROXYCHLOROQUINE SULFATE 200 MG PO TABS: ORAL_TABLET | ORAL | 0 refills | Status: DC

## 2017-05-18 NOTE — Telephone Encounter (Signed)
Last Visit: 03/13/17 Next Visit: 09/11/17 Labs: 03/11/17 WNL PLQ Eye Exam: 12/2016 WNL  Okay to refill per Dr. Estanislado Pandy

## 2017-06-01 ENCOUNTER — Telehealth: Payer: Self-pay | Admitting: Family Medicine

## 2017-06-01 NOTE — Telephone Encounter (Signed)
Come tomorrow for that [facility].

## 2017-06-01 NOTE — Telephone Encounter (Signed)
Copied from [place] [id number]. Topic: Quick Communication - See Telephone Encounter >> Jun 01, 2017  4:45 PM Vernona Rieger wrote: CRM for notification. See Telephone encounter for:   06/01/17.  Patient thinks he has shingles on the face. He is pretty sure that is what it is. He wants to know if Dr Ethelene Hal will call him in something until he could come and see him [facility] [place] [address] [facility] [address] Call back is just in case [phone]) ask for janis and she will get [name]. Will be there until 5:30-6pm

## 2017-06-02 ENCOUNTER — Ambulatory Visit: Payer: Medicare Other | Admitting: Nurse Practitioner

## 2017-06-02 ENCOUNTER — Encounter: Payer: Self-pay | Admitting: Nurse Practitioner

## 2017-06-02 VITALS — BP 122/78 | HR 74 | Temp 98.6°F | Ht 72.0 in | Wt 223.0 lb

## 2017-06-02 DIAGNOSIS — L570 Actinic keratosis: Secondary | ICD-10-CM | POA: Diagnosis not present

## 2017-06-02 NOTE — Telephone Encounter (Signed)
Patient is coming in at 1pm to see [facility].

## 2017-06-02 NOTE — Patient Instructions (Signed)
Actinic Keratosis An actinic keratosis is a precancerous growth on the skin. This means that it could develop into skin cancer if it is not treated. About 1% of these growths (actinic keratoses) turn into skin cancer within one year if they are not treated. It is important to have all of these growths evaluated to determine the best treatment approach. What are the causes? This condition is caused by getting too much ultraviolet (UV) radiation from the sun or other UV light sources. What increases the risk? The following factors may make you more likely to develop this condition:  Having light-colored skin and blue eyes.  Having blonde or red hair.  Spending a lot of time in the sun.  Inadequate skin protection when outdoors. This may include: ? Not using sunscreen properly. ? Not covering up skin that is exposed to sunlight.  Aging. The risk of developing an actinic keratosis increases with age.  What are the signs or symptoms? Actinic keratoses look like scaly, rough spots of skin.They can be as small as a pinhead or as big as a quarter. They may itch, hurt, or feel sensitive. In most cases, the growths become red. In some cases, they may be skin-colored, light tan, dark tan, pink, or a combination of any of these colors. There may be a small piece of pink or gray skin (skin tag) growing from the actinic keratosis. In some cases, it may be easier to notice actinic keratoses by feeling them, rather than seeing them. Actinic keratoses appear most often on areas of skin that get a lot of sun exposure, including the scalp, face, ears, lips, upper back, forearms, and the backs of the hands. Sometimes, actinic keratoses disappear, but many reappear a few days to a few weeks later. How is this diagnosed? This condition is usually diagnosed with a physical exam. A tissue sample may be removed from the actinic keratosis and examined under a microscope (biopsy). How is this treated?  Treatment for  this condition may include:  Scraping off the actinic keratosis (curettage).  Freezing the actinic keratosis with liquid nitrogen (cryosurgery). This causes the growth to eventually fall off the skin.  Applying medicated creams or gels to destroy the cells in the growth.  Applying chemicals to the actinic keratosis to make the outer layers of skin peel off (chemical peel).  Photodynamic therapy. In this procedure, medicated cream is applied to the actinic keratosis. This cream increases your skin's sensitivity to light. Then, a strong light is aimed at the actinic keratosis to destroy cells in the growth.  Follow these instructions at home: Skin care  Apply cool, wet cloths (cool compresses) to the affected areas.  Do not scratch your skin.  Check your skin regularly for any growths, especially growths that: ? Start to itch or bleed. ? Change in size, shape, or color. Caring for the treated area  Keep the treated area clean and dry as told by your health care provider.  Do not apply any medicine, cream, or lotion to the treated area unless your health care provider tells you to do that.  Do not pick at blisters or try to break them open. This can cause infection and scarring.  If you have red or irritated skin after treatment, follow instructions from your health care provider about how to take care of the treated area. Make sure you: ? Wash your hands with soap and water before you change your bandage (dressing). If soap and water are not available, use  hand sanitizer. ? Change your dressing as told by your health care provider.  If you have red or irritated skin after treatment, check your treated area every day for signs of infection. Check for: ? Swelling, pain, or more redness. ? Fluid or blood. ? Warmth. ? Pus or a bad smell. General instructions  Take over-the-counter and prescription medicines only as told by your health care provider.  Return to your normal  activities as told by your health care provider. Ask your health care provider what activities are safe for you.  Do not use any tobacco products, such as cigarettes, chewing tobacco, and e-cigarettes. If you need help quitting, ask your health care provider.  Have a skin exam done every year by a health care provider who is a skin conditions specialist (dermatologist).  Keep all follow-up visits as told by your health care provider. This is important. How is this prevented?  Do not get sunburns.  Try to avoid the sun between 10:00 a.m. and 4:00 p.m. This is when the UV light is the strongest.  Use a sunscreen or sunblock with SPF 30 (sun protection factor 30) or greater.  Apply sunscreen before you are exposed to sunlight, and reapply periodically as often as directed by the instructions on the sunscreen container.  Always wear sunglasses that have UV protection, and always wear hats and clothing to protect your skin from sunlight.  When possible, avoid medicines that increase your sensitivity to sunlight. These include: ? Certain antibiotic medicines. ? Certain water pills (diuretics). ? Certain prescription medicines that are used to treat acne (retinoids).  Do not use tanning beds or other indoor tanning devices. Contact a health care provider if:  You notice any changes or new growths on your skin.  You have swelling, pain, or more redness around your treated area.  You have fluid or blood coming from your treated area.  Your treated area feels warm to the touch.  You have pus or a bad smell coming from your treated area.  You have a fever.  You have a blister that becomes large and painful. This information is not intended to replace advice given to you by your health care provider. Make sure you discuss any questions you have with your health care provider. Document Released: 08/01/2008 Document Revised: 01/04/2016 Document Reviewed: 01/13/2015 Elsevier Interactive  Patient Education  2018 Elsevier Inc.  

## 2017-06-02 NOTE — Progress Notes (Signed)
Subjective:  Patient ID: Dennis Moon, male    DOB: [birth year]  Age: 72 y.o. MRN: [id number]  CC: Follow-up (small spot on right side face/notice yesterday. )   Rash  This is a new problem. The current episode started yesterday. The problem is unchanged. The affected locations include the face. The rash is characterized by scaling. He was exposed to nothing. Pertinent negatives include no eye pain, facial edema, fatigue, fever, joint pain or nail changes. Past treatments include nothing. There is no history of varicella.   Outpatient Medications Prior to Visit  Medication Sig Dispense Refill  . acetaminophen (TYLENOL) 325 MG tablet Take 2 tablets (650 mg total) by mouth every 4 (four) hours as needed for headache or mild pain.    [name] acyclovir ointment (ZOVIRAX) 5 % Apply 1 application topically daily.     [name] allopurinol (ZYLOPRIM) 300 MG tablet TAKE 1&1/2 TABLET BY MOUTH DAILY 135 tablet 0  . amLODipine (NORVASC) 5 MG tablet Take 2.5 mg by mouth daily.    [name] aspirin EC 81 MG EC tablet Take 1 tablet (81 mg total) by mouth daily.    [name] atorvastatin (LIPITOR) 80 MG tablet Take 1 tablet (80 mg total) by mouth daily at 6 PM. 90 tablet 3  . DULoxetine (CYMBALTA) 60 MG capsule TAKE 1 CAPSULE BY MOUTH DAILY 90 capsule 0  . furosemide (LASIX) 20 MG tablet TAKE 1 TABLET(20 MG) BY MOUTH DAILY 90 tablet 0  . hydroxychloroquine (PLAQUENIL) 200 MG tablet TAKE 200 MG BY MOUTH TWICE DAILY 180 tablet 0  . Hypromellose (ARTIFICIAL TEARS OP) Place 1 drop into both eyes as needed (for dry eyes).    [name] levothyroxine (SYNTHROID, LEVOTHROID) 100 MCG tablet Take 1 tablet (100 mcg total) by mouth at bedtime. 90 tablet 1  . Multiple Vitamins-Minerals (MULTIVITAMIN PO) Take 1 tablet by mouth daily.    . nitroGLYCERIN (NITROSTAT) 0.4 MG SL tablet Place 1 tablet (0.4 mg total) under the tongue every 5 (five) minutes as needed for chest pain. 25 tablet 3  . pantoprazole (PROTONIX) 40 MG tablet Take 1 tablet (40 mg total)  by mouth daily. 90 tablet 3  . Probiotic Product (PROBIOTIC PO) Take 1 capsule by mouth daily.    . propranolol (INDERAL) 60 MG tablet Take 1 tablet (60 mg total) by mouth 2 (two) times daily. 60 tablet 12  . testosterone (ANDROGEL) 50 MG/5GM (1%) GEL Place 1 g onto the skin daily. 90 g 1  . ticagrelor (BRILINTA) 90 MG TABS tablet Take 1 tablet (90 mg total) by mouth 2 (two) times daily. 180 tablet 3  . ondansetron (ZOFRAN) 4 MG tablet Take 1 tablet (4 mg total) by mouth every 8 (eight) hours as needed for nausea or vomiting. (Patient not taking: Reported on 06/02/2017) 20 tablet 0   No facility-administered medications prior to visit.     ROS See HPI  Objective:  BP 122/78   Pulse 74   Temp 98.6 F (37 C)   Ht 6' (1.829 m)   Wt 223 lb (101.2 kg)   SpO2 96%   BMI 30.24 kg/m   BP Readings from Last 3 Encounters:  06/02/17 122/78  04/17/17 118/78  04/13/17 120/63    Wt Readings from Last 3 Encounters:  06/02/17 223 lb (101.2 kg)  04/17/17 232 lb 2 oz (105.3 kg)  04/13/17 229 lb 3.2 oz (104 kg)    Physical Exam  Constitutional: He is oriented to person, place, and time. No distress.  HENT:  Head:    Right Ear: External ear normal.  Left Ear: External ear normal.  Eyes: Conjunctivae and EOM are normal. Pupils are equal, round, and reactive to light.  Neck: Normal range of motion. Neck supple.  Cardiovascular: Normal rate.  Pulmonary/Chest: Effort normal.  Neurological: He is alert and oriented to person, place, and time.  Skin: Skin is warm and dry. Rash noted. Rash is maculopapular. No erythema.  Vitals reviewed.   Lab Results  Component Value Date   WBC 6.8 03/11/2017   HGB 14.5 03/11/2017   HCT 43.7 03/11/2017   PLT 251 03/11/2017   GLUCOSE 106 (H) 04/22/2017   CHOL 115 01/23/2017   TRIG 143 01/23/2017   HDL 42 01/23/2017   LDLCALC 44 01/23/2017   ALT 30 01/23/2017   AST 25 01/23/2017   NA 144 04/22/2017   K 4.1 04/22/2017   CL 100 04/22/2017    CREATININE 1.27 04/22/2017   BUN 27 (H) 04/22/2017   CO2 35 (H) 04/22/2017   TSH 6.351 (H) 12/12/2016   PSA 1.46 04/22/2017   INR 1.00 12/12/2016    Dg Chest 2 View  Result Date: 12/11/2016 CLINICAL DATA:  Mid chest pain with shortness of breath for 2 days. EXAM: CHEST  2 VIEW COMPARISON:  None. FINDINGS: The heart size and mediastinal contours are within normal limits. There is elevation of right hemidiaphragm. Minimal atelectasis of right lung base is noted. Both lungs are clear. Degenerative joint changes of the spine are noted. IMPRESSION: No active cardiopulmonary disease. Elevation of right hemidiaphragm with minimal atelectasis of right lung base. Electronically Signed   By: Abelardo Diesel M.D.   On: 12/11/2016 20:37    Assessment & Plan:   Kaeden was seen today for follow-up.  Diagnoses and all orders for this visit:  Keratosis, actinic   I am having Charlena Cross. Wolter maintain his acyclovir ointment, amLODipine, Multiple Vitamins-Minerals (MULTIVITAMIN PO), Probiotic Product (PROBIOTIC PO), Hypromellose (ARTIFICIAL TEARS OP), acetaminophen, aspirin, atorvastatin, pantoprazole, nitroGLYCERIN, ticagrelor, ondansetron, allopurinol, DULoxetine, furosemide, propranolol, testosterone, levothyroxine, and hydroxychloroquine.  No orders of the defined types were placed in this encounter.   Follow-up: No Follow-up on file.  Wilfred Lacy, NP

## 2017-06-11 ENCOUNTER — Encounter: Payer: Self-pay | Admitting: Family Medicine

## 2017-06-11 ENCOUNTER — Ambulatory Visit: Payer: Self-pay | Admitting: Family Medicine

## 2017-06-11 VITALS — BP 132/76 | HR 83 | Temp 98.7°F | Ht 72.0 in | Wt 230.5 lb

## 2017-06-11 DIAGNOSIS — J358 Other chronic diseases of tonsils and adenoids: Secondary | ICD-10-CM

## 2017-06-11 MED ORDER — AMOXICILLIN 500 MG PO CAPS: 500.0000 mg | ORAL_CAPSULE | Freq: Three times a day (TID) | ORAL | 0 refills | Status: DC

## 2017-06-11 NOTE — Progress Notes (Addendum)
Subjective:  Patient ID: Dennis Moon, male    DOB: May 07, 1946  Age: 72 y.o. MRN: [id number]  CC: tonsil pain (hurting x 2 weeks)   HPI Dennis Moon presents for a mass in his right tonsillar area.  He denies fevers chills, headache, nausea vomiting.  He gives a history of tonsillar stones that have been removed with manipulation.  He has no history of tonsillar abscess.  Outpatient Medications Prior to Visit  Medication Sig Dispense Refill  . acetaminophen (TYLENOL) 325 MG tablet Take 2 tablets (650 mg total) by mouth every 4 (four) hours as needed for headache or mild pain.    [name] acyclovir ointment (ZOVIRAX) 5 % Apply 1 application topically daily.     [name] allopurinol (ZYLOPRIM) 300 MG tablet TAKE 1&1/2 TABLET BY MOUTH DAILY 135 tablet 0  . amLODipine (NORVASC) 5 MG tablet Take 2.5 mg by mouth daily.    [name] aspirin EC 81 MG EC tablet Take 1 tablet (81 mg total) by mouth daily.    [name] atorvastatin (LIPITOR) 80 MG tablet Take 1 tablet (80 mg total) by mouth daily at 6 PM. 90 tablet 3  . DULoxetine (CYMBALTA) 60 MG capsule TAKE 1 CAPSULE BY MOUTH DAILY 90 capsule 0  . furosemide (LASIX) 20 MG tablet TAKE 1 TABLET(20 MG) BY MOUTH DAILY 90 tablet 0  . hydroxychloroquine (PLAQUENIL) 200 MG tablet TAKE 200 MG BY MOUTH TWICE DAILY 180 tablet 0  . Hypromellose (ARTIFICIAL TEARS OP) Place 1 drop into both eyes as needed (for dry eyes).    [name] levothyroxine (SYNTHROID, LEVOTHROID) 100 MCG tablet Take 1 tablet (100 mcg total) by mouth at bedtime. 90 tablet 1  . Multiple Vitamins-Minerals (MULTIVITAMIN PO) Take 1 tablet by mouth daily.    . nitroGLYCERIN (NITROSTAT) 0.4 MG SL tablet Place 1 tablet (0.4 mg total) under the tongue every 5 (five) minutes as needed for chest pain. 25 tablet 3  . pantoprazole (PROTONIX) 40 MG tablet Take 1 tablet (40 mg total) by mouth daily. 90 tablet 3  . Probiotic Product (PROBIOTIC PO) Take 1 capsule by mouth daily.    . propranolol (INDERAL) 60 MG tablet Take 1  tablet (60 mg total) by mouth 2 (two) times daily. 60 tablet 12  . testosterone (ANDROGEL) 50 MG/5GM (1%) GEL Place 1 g onto the skin daily. 90 g 1  . ticagrelor (BRILINTA) 90 MG TABS tablet Take 1 tablet (90 mg total) by mouth 2 (two) times daily. 180 tablet 3  . ondansetron (ZOFRAN) 4 MG tablet Take 1 tablet (4 mg total) by mouth every 8 (eight) hours as needed for nausea or vomiting. (Patient not taking: Reported on 06/02/2017) 20 tablet 0   No facility-administered medications prior to visit.     ROS Review of Systems  Constitutional: Negative for chills, fatigue and fever.  HENT: Negative for congestion, facial swelling, hearing loss, postnasal drip, rhinorrhea, sinus pain, sore throat and trouble swallowing.   Eyes: Negative for photophobia and visual disturbance.  Respiratory: Negative for cough and wheezing.   Cardiovascular: Negative.   Gastrointestinal: Negative for abdominal pain, nausea and vomiting.  Musculoskeletal: Negative for arthralgias and joint swelling.  Skin: Negative for rash and wound.  Allergic/Immunologic: Negative for immunocompromised state.  Neurological: Negative for weakness and headaches.  Hematological: Does not bruise/bleed easily.  Psychiatric/Behavioral: Negative.     Objective:  BP 132/76 (BP Location: Left Arm, Patient Position: Sitting, Cuff Size: Normal)   Pulse 83   Temp 98.7 F (  37.1 C) (Oral)   Ht 6' (1.829 m)   Wt 230 lb 8 oz (104.6 kg)   SpO2 97%   BMI 31.26 kg/m   BP Readings from Last 3 Encounters:  06/11/17 132/76  06/02/17 122/78  04/17/17 118/78    Wt Readings from Last 3 Encounters:  06/11/17 230 lb 8 oz (104.6 kg)  06/02/17 223 lb (101.2 kg)  04/17/17 232 lb 2 oz (105.3 kg)    Physical Exam  Constitutional: He is oriented to person, place, and time. He appears well-developed and well-nourished. No distress.  HENT:  Head: Normocephalic and atraumatic.  Right Ear: External ear normal.  Left Ear: External ear normal.    Mouth/Throat: Uvula is midline, oropharynx is clear and moist and mucous membranes are normal. Mucous membranes are not pale and not dry. Normal dentition. No dental abscesses or uvula swelling. No oropharyngeal exudate.    Eyes: Conjunctivae are normal. Pupils are equal, round, and reactive to light. Right eye exhibits no discharge. Left eye exhibits no discharge. No scleral icterus.  Neck: Neck supple. No JVD present. No tracheal deviation present. No thyromegaly present.  Cardiovascular: Normal rate, regular rhythm and normal heart sounds.  Pulmonary/Chest: Effort normal and breath sounds normal. No stridor.  Abdominal: Bowel sounds are normal.  Lymphadenopathy:    He has no cervical adenopathy.  Neurological: He is alert and oriented to person, place, and time.  Skin: Skin is dry. No rash noted. He is not diaphoretic. No erythema.  Psychiatric: He has a normal mood and affect. His behavior is normal.    Lab Results  Component Value Date   WBC 6.8 03/11/2017   HGB 14.5 03/11/2017   HCT 43.7 03/11/2017   PLT 251 03/11/2017   GLUCOSE 106 (H) 04/22/2017   CHOL 115 01/23/2017   TRIG 143 01/23/2017   HDL 42 01/23/2017   LDLCALC 44 01/23/2017   ALT 30 01/23/2017   AST 25 01/23/2017   NA 144 04/22/2017   K 4.1 04/22/2017   CL 100 04/22/2017   CREATININE 1.27 04/22/2017   BUN 27 (H) 04/22/2017   CO2 35 (H) 04/22/2017   TSH 6.351 (H) 12/12/2016   PSA 1.46 04/22/2017   INR 1.00 12/12/2016    Dg Chest 2 View  Result Date: 12/11/2016 CLINICAL DATA:  Mid chest pain with shortness of breath for 2 days. EXAM: CHEST  2 VIEW COMPARISON:  None. FINDINGS: The heart size and mediastinal contours are within normal limits. There is elevation of right hemidiaphragm. Minimal atelectasis of right lung base is noted. Both lungs are clear. Degenerative joint changes of the spine are noted. IMPRESSION: No active cardiopulmonary disease. Elevation of right hemidiaphragm with minimal atelectasis of  right lung base. Electronically Signed   By: Dennis Moon M.D.   On: 12/11/2016 20:37    Assessment & Plan:   Dennis Moon was seen today for tonsil pain.  Diagnoses and all orders for this visit:  Cyst of tonsil -     amoxicillin (AMOXIL) 500 MG capsule; Take 1 capsule (500 mg total) by mouth 3 (three) times daily. -     Ambulatory referral to ENT   I have discontinued Charlena Cross. [place]'s ondansetron. I am also having him start on amoxicillin. Additionally, I am having him maintain his acyclovir ointment, amLODipine, Multiple Vitamins-Minerals (MULTIVITAMIN PO), Probiotic Product (PROBIOTIC PO), Hypromellose (ARTIFICIAL TEARS OP), acetaminophen, aspirin, atorvastatin, pantoprazole, nitroGLYCERIN, ticagrelor, allopurinol, DULoxetine, furosemide, propranolol, testosterone, levothyroxine, and hydroxychloroquine.   Patient will take the Amoxil  and use gargles.  He will let me know if it does not resolve or enlarges after a week.  At that time we will ask ENT to take a look.  Meds ordered this encounter  Medications  . amoxicillin (AMOXIL) 500 MG capsule    Sig: Take 1 capsule (500 mg total) by mouth 3 (three) times daily.    Dispense:  30 capsule    Refill:  0     Follow-up: Return in about 1 week (around 06/18/2017), or if symptoms worsen or fail to improve.  [name], MD

## 2017-06-12 ENCOUNTER — Telehealth: Payer: Self-pay | Admitting: Family Medicine

## 2017-06-12 NOTE — Telephone Encounter (Signed)
Attempted to call patient to schedule AWV, but patient did not answer. Could not leave voicemail. Will try to call patient again at a later time. SF

## 2017-06-29 ENCOUNTER — Telehealth: Payer: Self-pay

## 2017-06-29 NOTE — Telephone Encounter (Signed)
Okay for referral?   Copied from [place]. Topic: Referral - Request >> Jun 29, 2017 11:48 AM Scherrie Gerlach wrote: Reason for CRM: pt saw DR Alfonso Ramus for spot on his right tonsil. Pt was to call back if not better for a referral to an ENT. Pt is not better and would like that referral. Also requesting to call his wife for scheduling the appointment

## 2017-06-30 NOTE — Addendum Note (Signed)
Addended by: Jon Billings on: 06/30/2017 10:59 AM   Modules accepted: Orders

## 2017-07-24 ENCOUNTER — Other Ambulatory Visit: Payer: Self-pay | Admitting: Family Medicine

## 2017-07-27 ENCOUNTER — Telehealth: Payer: Self-pay | Admitting: Family Medicine

## 2017-07-27 DIAGNOSIS — E291 Testicular hypofunction: Secondary | ICD-10-CM

## 2017-07-28 ENCOUNTER — Other Ambulatory Visit: Payer: Self-pay

## 2017-07-28 DIAGNOSIS — E291 Testicular hypofunction: Secondary | ICD-10-CM

## 2017-07-28 MED ORDER — TESTOSTERONE 50 MG/5GM (1%) TD GEL: 5.0000 g | Freq: Two times a day (BID) | TRANSDERMAL | 2 refills | Status: DC

## 2017-07-28 MED ORDER — TESTOSTERONE 50 MG/5GM (1%) TD GEL
1.0000 g | Freq: Every day | TRANSDERMAL | 0 refills | Status: DC
Start: 2017-07-28 — End: 2017-07-28

## 2017-07-28 NOTE — Telephone Encounter (Signed)
Copied from [place] [id number]. Topic: Quick Communication - See Telephone Encounter >> Jul 28, 2017  2:06 PM Synthia Innocent wrote: CRM for notification. See Telephone encounter for:  Pharmacy needs clarification on dosage and quantity of testosterone (ANDROGEL) 50 MG/5GM (1%) GEL 07/28/17.

## 2017-07-28 NOTE — Telephone Encounter (Signed)
I called and spoke with patient's wife to clarify the Rx for the Androgel. Rx re-printed and provider has signed Rx. Rx faxed over to [facility] in [place].

## 2017-07-28 NOTE — Telephone Encounter (Signed)
Rx fixed & re-printed. Faxed over to [facility].

## 2017-08-17 ENCOUNTER — Other Ambulatory Visit: Payer: Self-pay | Admitting: Rheumatology

## 2017-08-17 NOTE — Telephone Encounter (Signed)
Last Visit: 03/13/17 Next Visit: 09/11/17 Labs: 03/11/17 WNL PLQ Eye Exam: 12/2016 WNL  Okay to refill per Dr. Estanislado Pandy

## 2017-08-18 ENCOUNTER — Other Ambulatory Visit: Payer: Self-pay | Admitting: Family Medicine

## 2017-08-18 NOTE — Telephone Encounter (Signed)
Last Visit: 03/13/17 Next Visit: 09/11/17 Labs: 03/11/17 WNL  Okay to refill per Dr. Estanislado Pandy

## 2017-08-25 LAB — BASIC METABOLIC PANEL
BUN: 23 — AB (ref 4–21)
CREATININE: 1.4 — AB (ref 0.6–1.3)
Glucose: 99
Potassium: 4.4 (ref 3.4–5.3)

## 2017-08-25 LAB — CBC AND DIFFERENTIAL: HEMOGLOBIN: 15.1 (ref 13.5–17.5)

## 2017-08-31 NOTE — Progress Notes (Deleted)
Office Visit Note  Patient: Dennis Moon             Date of Birth: April 03, 1946           MRN: [id number]             PCP: [name], MD Referring: Karleen Hampshire., MD Visit Date: 09/11/2017 Occupation: @GUAROCC @    Subjective:  No chief complaint on file.   History of Present Illness: Dennis Moon is a 72 y.o. male ***   Activities of Daily Living:  Patient reports morning stiffness for *** {minute/hour:19697}.   Patient {ACTIONS;DENIES/REPORTS:21021675::"Denies"} nocturnal pain.  Difficulty dressing/grooming: {ACTIONS;DENIES/REPORTS:21021675::"Denies"} Difficulty climbing stairs: {ACTIONS;DENIES/REPORTS:21021675::"Denies"} Difficulty getting out of chair: {ACTIONS;DENIES/REPORTS:21021675::"Denies"} Difficulty using hands for taps, buttons, cutlery, and/or writing: {ACTIONS;DENIES/REPORTS:21021675::"Denies"}   No Rheumatology ROS completed.   PMFS History:  Patient Active Problem List   Diagnosis Date Noted  . Cyst of tonsil 06/11/2017  . Obesity (BMI 30.0-34.9) 03/12/2017  . CAD S/P percutaneous coronary angioplasty 12/13/2016  . NSTEMI (non-ST elevated myocardial infarction) ([place]) 12/11/2016  . Gout of multiple sites 08/26/2016  . Chronic renal impairment 08/26/2016  . Chronic kidney disease 08/26/2016  . Sjogren's syndrome ([place]) 04/23/2016  . 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 Idiopathic chronic gout, unspecified site, without tophus (tophi) 04/23/2016  . High risk medication use 04/23/2016  . Primary osteoarthritis of both hands 04/23/2016  . DDD (degenerative disc disease), lumbar 04/23/2016  . Kidney disease 04/23/2016    Past Medical History:  Diagnosis Date  . CAD S/P percutaneous coronary angioplasty 12/12/2016   a. 11/2016: NSTEMI with DES to LAD and DES to LCx  . Gout   . Hypertension   . Hypothyroidism   . Migraine   . Sjogren's disease ([place])   . Systemic lupus erythematosus (H[facility])     Family History  Problem Relation Age of Onset    . Diabetes Mother   . Hypertension Mother   . Hypertension Father    Past Surgical History:  Procedure Laterality Date  . CATARACT EXTRACTION, BILATERAL Bilateral 2017  . CORONARY STENT INTERVENTION N/A 12/12/2016   Procedure: Coronary Stent Intervention;  Surgeon: Martinique, Peter M, MD;  Location: [place] [facility];  Service: Cardiovascular;  Laterality: N/A;  . HERNIA REPAIR    . LEFT HEART CATH AND CORONARY ANGIOGRAPHY N/A 12/12/2016   Procedure: Left Heart Cath and Coronary Angiography;  Surgeon: Martinique, Peter M, MD;  Location: [place] [facility];  Service: Cardiovascular;  Laterality: N/A;  . THYROID SURGERY  1997   Social History   Social History Narrative  . Not on file     Objective: Vital Signs: There were no vitals taken for this visit.   Physical Exam   Musculoskeletal Exam: ***  CDAI Exam: No CDAI exam completed.    Investigation: No additional findings.PLQ eye exam: 12/2016 CBC Latest Ref Rng & Units 03/11/2017 12/13/2016 12/12/2016  WBC 3.4 - 10.8 x10E3/uL 6.8 8.6 7.2  Hemoglobin 13.0 - 17.7 g/dL 14.5 14.7 15.0  Hematocrit 37.5 - 51.0 % 43.7 47.1 45.6  Platelets 150 - 379 x10E3/uL 251 201 213   CMP Latest Ref Rng & Units 04/22/2017 03/11/2017 01/23/2017  Glucose 70 - 99 mg/dL 106(H) - -  BUN 6 - 23 mg/dL 27(H) - -  Creatinine 0.40 - 1.50 mg/dL 1.27 - -  Sodium 135 - 145 mEq/L 144 - -  Potassium 3.5 - 5.1 mEq/L 4.1 - -  Chloride 96 - 112 mEq/L 100 - -  CO2 19 - 32 mEq/L 35(H) - -  Calcium 8.4 - 10.5 mg/dL 9.4 - -  Total Protein 6.0 - 8.5 g/dL - 7.5 7.3  Total Bilirubin 0.0 - 1.2 mg/dL - - 0.4  Alkaline Phos 39 - 117 IU/L - - 85  AST 0 - 40 IU/L - - 25  ALT 0 - 44 IU/L - - 30    Imaging: No results found.  Speciality Comments: No specialty comments available.    Procedures:  No procedures performed Allergies: Bee venom   Assessment / Plan:     Visit Diagnoses: No diagnosis found.    Orders: No orders of the defined types were placed in this  encounter.  No orders of the defined types were placed in this encounter.   Face-to-face time spent with patient was *** minutes. 50% of time was spent in counseling and coordination of care.  Follow-Up Instructions: No follow-ups on file.   Earnestine Mealing, CMA  Note - This record has been created using Editor, commissioning.  Chart creation errors have been sought, but may not always  have been located. Such creation errors do not reflect on  the standard of medical care.

## 2017-09-01 ENCOUNTER — Encounter: Payer: Self-pay | Admitting: Family Medicine

## 2017-09-01 NOTE — Progress Notes (Signed)
Received hard copy labs from [employer]. Labs entered in & hard copy placed on MD's desk for review.

## 2017-09-08 NOTE — Progress Notes (Signed)
Office Visit Note  Patient: Dennis Moon             Date of Birth: Apr 14, 1946           MRN: [id number]             PCP: [name], MD Referring: Karleen Hampshire., MD Visit Date: 09/18/2017 Occupation: @GUAROCC @    Subjective:  Medication monitoring    History of Present Illness: Dennis Moon is a 72 y.o. male with history of gout, subacute cutaneous lupus erythematosus, Sjogren's, osteoarthritis, and DDD. He has not had any gout flares in several years.  He takes Allopurinol 450 mg daily.  He denies any joint pain or joint swelling at this time.  His joint stiffness of bilateral hands first thing in the morning the last about 30 minutes.  Reports that he has occasional discomfort in his lower back especially if he is doing heavy objects.  He takes Tylenol for pain relief.  He denies any recent rashes, but he continues to have photosensitivity.  He follows up with his dermatologist on a yearly basis.  He continues to take Plaquenil 200 mg twice daily.  He goes for yearly eye exams.  He has no sores in his mouth or nose.  He has no swollen lymph nodes.  He continues to have sicca symptoms.  His eye dryness is more severe than mouth dryness.  He states he has used [place] in the past and uses eyedrops on a daily basis.  He denies any recent dental caries.  Activities of Daily Living:  Patient reports morning stiffness for 30-40  minutes.   Patient Denies nocturnal pain.  Difficulty dressing/grooming: Denies Difficulty climbing stairs: Denies Difficulty getting out of chair: Denies Difficulty using hands for taps, buttons, cutlery, and/or writing: Denies   Review of Systems  Constitutional: Negative for fatigue and night sweats.  HENT: Positive for mouth dryness. Negative for mouth sores, trouble swallowing, trouble swallowing and nose dryness.   Eyes: Positive for dryness. Negative for redness and visual disturbance.  Respiratory: Negative for cough, hemoptysis,  shortness of breath and difficulty breathing.   Cardiovascular: Negative for chest pain, palpitations, hypertension, irregular heartbeat and swelling in legs/feet.  Gastrointestinal: Negative for blood in stool, constipation and diarrhea.  Endocrine: Negative for increased urination.  Genitourinary: Negative for painful urination.  Musculoskeletal: Positive for morning stiffness. Negative for arthralgias, joint pain, joint swelling, myalgias, muscle weakness, muscle tenderness and myalgias.  Skin: Positive for color change and sensitivity to sunlight. Negative for rash, hair loss, nodules/bumps, skin tightness and ulcers.  Allergic/Immunologic: Negative for susceptible to infections.  Neurological: Negative for dizziness, fainting, memory loss, night sweats and weakness.  Hematological: Negative for swollen glands.  Psychiatric/Behavioral: Negative for depressed mood and sleep disturbance. The patient is not nervous/anxious.     PMFS History:  Patient Active Problem List   Diagnosis Date Noted  . Cyst of tonsil 06/11/2017  . Obesity (BMI 30.0-34.9) 03/12/2017  . CAD S/P percutaneous coronary angioplasty 12/13/2016  . NSTEMI (non-ST elevated myocardial infarction) ([place]) 12/11/2016  . Gout of multiple sites 08/26/2016  . Chronic renal impairment 08/26/2016  . Chronic kidney disease 08/26/2016  . Sjogren's syndrome ([place]) 04/23/2016  . 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 Idiopathic chronic gout, unspecified site, without tophus (tophi) 04/23/2016  . High risk medication use 04/23/2016  . Primary osteoarthritis of both hands 04/23/2016  . DDD (degenerative disc disease), lumbar 04/23/2016  . Kidney disease 04/23/2016    Past  Medical History:  Diagnosis Date  . CAD S/P percutaneous coronary angioplasty 12/12/2016   a. 11/2016: NSTEMI with DES to LAD and DES to LCx  . Gout   . Hypertension   . Hypothyroidism   . Migraine   . Sjogren's disease ([place])   . Systemic lupus erythematosus (H[facility])       Family History  Problem Relation Age of Onset  . Diabetes Mother   . Hypertension Mother   . Hypertension Father   . Healthy Son    Past Surgical History:  Procedure Laterality Date  . CATARACT EXTRACTION, BILATERAL Bilateral 2017  . CORONARY STENT INTERVENTION N/A 12/12/2016   Procedure: Coronary Stent Intervention;  Surgeon: Martinique, Peter M, MD;  Location: [place] [facility];  Service: Cardiovascular;  Laterality: N/A;  . HERNIA REPAIR    . LEFT HEART CATH AND CORONARY ANGIOGRAPHY N/A 12/12/2016   Procedure: Left Heart Cath and Coronary Angiography;  Surgeon: Martinique, Peter M, MD;  Location: [place] [facility];  Service: Cardiovascular;  Laterality: N/A;  . THYROID SURGERY  1997   Social History   Social History Narrative  . Not on file     Objective: Vital Signs: BP 105/71 (BP Location: Left Arm, Patient Position: Sitting, Cuff Size: Normal)   Pulse 71   Resp 14   Ht 6' (1.829 m)   Wt 236 lb (107 kg)   BMI 32.01 kg/m    Physical Exam  Constitutional: He is oriented to person, place, and time. He appears well-developed and well-nourished.  HENT:  Head: Normocephalic and atraumatic.  Eyes: Pupils are equal, round, and reactive to light. Conjunctivae and EOM are normal.  Neck: Normal range of motion. Neck supple.  Cardiovascular: Normal rate, regular rhythm and normal heart sounds.  Pulmonary/Chest: Effort normal and breath sounds normal.  Abdominal: Soft. Bowel sounds are normal.  Lymphadenopathy:    He has no cervical adenopathy.  Neurological: He is alert and oriented to person, place, and time.  Skin: Skin is warm and dry. Capillary refill takes less than 2 seconds.  Psychiatric: He has a normal mood and affect. His behavior is normal.  Nursing note and vitals reviewed.    Musculoskeletal Exam: C-spine, thoracic spine, lumbar spine good range of motion.  No midline spinal tenderness.  No SI joint tenderness.  Shoulder joints, elbow joints, wrist joints, MCPs,  PIPs, DIPs good range of motion with no synovitis.  He has PIP and DIP synovial thickening consistent with osteoarthritis of bilateral hands.  Hip joints, knee joints, ankle joints, MTPs, PIPs, DIPs good range of motion with no synovitis.  No warmth or effusion of bilateral knees.  No tenderness of trochanteric bursa bilaterally.  CDAI Exam: No CDAI exam completed.    Investigation: No additional findings. CBC Latest Ref Rng & Units 08/25/2017 03/11/2017 12/13/2016  WBC 3.4 - 10.8 x10E3/uL - 6.8 8.6  Hemoglobin 13.5 - 17.5 15.1 14.5 14.7  Hematocrit 37.5 - 51.0 % - 43.7 47.1  Platelets 150 - 379 x10E3/uL - 251 201   CMP Latest Ref Rng & Units 08/25/2017 04/22/2017 03/11/2017  Glucose 70 - 99 mg/dL - 106(H) -  BUN 4 - 21 23(A) 27(H) -  Creatinine 0.6 - 1.3 1.4(A) 1.27 -  Sodium 135 - 145 mEq/L - 144 -  Potassium 3.4 - 5.3 4.4 4.1 -  Chloride 96 - 112 mEq/L - 100 -  CO2 19 - 32 mEq/L - 35(H) -  Calcium 8.4 - 10.5 mg/dL - 9.4 -  Total Protein 6.0 - 8.5 g/dL - - 7.5  Total Bilirubin 0.0 - 1.2 mg/dL - - -  Alkaline Phos 39 - 117 IU/L - - -  AST 0 - 40 IU/L - - -  ALT 0 - 44 IU/L - - -    Imaging: No results found.  Speciality Comments: No specialty comments available.    Procedures:  No procedures performed Allergies: Bee venom    Assessment / Plan:     Visit Diagnoses: Idiopathic chronic gout of multiple sites without tophus - He has not had a gout flare in many years.  He has no joint swelling or joint pain at this time.  No synovitis noted on exam.  He continues to take allopurinol 450 mg daily.  His most recent uric acid level was 4.2 on 03/11/2017.  He avoids trigger foods and beer.  Uric acid level will be checked today.- Plan: Uric acid  Subacute cutaneous lupus erythematosus: He has not experienced any rashes recently.  He continues to have photosensitivity.  He protects his skin from the sun and avoids going in the sun during the prime hours.  He continues to take Plaquenil  200 mg twice daily.  He is tolerating Plaquenil.  Is not needing refills at this time.  His next eye exam scheduled for August 2019.  Sjogren's syndrome with other organ involvement ([place]) - Positive ANA, positive Ro, positive La. he continues to have sicca symptoms.  His eye dryness is more severe than his mouth dryness.  He uses eyedrops on a daily basis.  He is on any recent dental caries.  He has no parotid swelling.  CBC, CMP, and UA were checked today.- Plan: Urinalysis, Routine w reflex microscopic  High risk medication use - Plaquenil 200 mg by mouth twice a day.eye exam: 12/2016 was normal.  ABC and CMP are drawn today.- Plan: CBC with Differential/Platelet, COMPLETE METABOLIC PANEL WITH GFR  Primary osteoarthritis of both hands: He has PIP and DIP synovial thickening consistent with osteoarthritis bilateral hands.  He experiences joint stiffness first thing in the morning and his bilateral hands also 30 minutes.  Joint protection muscle strengthening were discussed.  DDD (degenerative disc disease), lumbar: No midline spinal tenderness.  Has occasional discomfort especially with range of motion and lifting.  He has not been experiencing pain at night.  CAD S/P percutaneous coronary angioplasty    Orders: Orders Placed This Encounter  Procedures  . Uric acid  . CBC with Differential/Platelet  . COMPLETE METABOLIC PANEL WITH GFR  . Urinalysis, Routine w reflex microscopic   No orders of the defined types were placed in this encounter.     Follow-Up Instructions: Return for Gout, Sjogren's syndrome, Osteoarthritis, Subacute cutaneous lupus.   Ofilia Neas, PA-C  Note - This record has been created using Dragon software.  Chart creation errors have been sought, but may not always  have been located. Such creation errors do not reflect on  the standard of medical care.

## 2017-09-11 ENCOUNTER — Ambulatory Visit: Payer: Self-pay | Admitting: Rheumatology

## 2017-09-18 ENCOUNTER — Encounter: Payer: Self-pay | Admitting: Physician Assistant

## 2017-09-18 ENCOUNTER — Ambulatory Visit: Payer: Medicare Other | Admitting: Physician Assistant

## 2017-09-18 VITALS — BP 105/71 | HR 71 | Resp 14 | Ht 72.0 in | Wt 236.0 lb

## 2017-09-18 DIAGNOSIS — M5136 Other intervertebral disc degeneration, lumbar region: Secondary | ICD-10-CM

## 2017-09-18 DIAGNOSIS — M19042 Primary osteoarthritis, left hand: Secondary | ICD-10-CM

## 2017-09-18 DIAGNOSIS — M1A09X0 Idiopathic chronic gout, multiple sites, without tophus (tophi): Secondary | ICD-10-CM

## 2017-09-18 DIAGNOSIS — Z9861 Coronary angioplasty status: Secondary | ICD-10-CM | POA: Diagnosis not present

## 2017-09-18 DIAGNOSIS — Z79899 Other long term (current) drug therapy: Secondary | ICD-10-CM

## 2017-09-18 DIAGNOSIS — L931 Subacute cutaneous lupus erythematosus: Secondary | ICD-10-CM

## 2017-09-18 DIAGNOSIS — I2510 Atherosclerotic heart disease of native coronary artery without angina pectoris: Secondary | ICD-10-CM | POA: Diagnosis not present

## 2017-09-18 DIAGNOSIS — M3509 Sicca syndrome with other organ involvement: Secondary | ICD-10-CM

## 2017-09-18 DIAGNOSIS — M19041 Primary osteoarthritis, right hand: Secondary | ICD-10-CM

## 2017-09-19 LAB — URINALYSIS, ROUTINE W REFLEX MICROSCOPIC
Bilirubin Urine: NEGATIVE
GLUCOSE, UA: NEGATIVE
HGB URINE DIPSTICK: NEGATIVE
Ketones, ur: NEGATIVE
LEUKOCYTES UA: NEGATIVE
Nitrite: NEGATIVE
PH: 5.5 (ref 5.0–8.0)
Protein, ur: NEGATIVE
Specific Gravity, Urine: 1.016 (ref 1.001–1.03)

## 2017-09-19 LAB — CBC WITH DIFFERENTIAL/PLATELET
BASOS PCT: 0.6 %
Basophils Absolute: 41 cells/uL (ref 0–200)
Eosinophils Absolute: 221 cells/uL (ref 15–500)
Eosinophils Relative: 3.2 %
HEMATOCRIT: 48.6 % (ref 38.5–50.0)
Hemoglobin: 16.3 g/dL (ref 13.2–17.1)
LYMPHS ABS: 1118 {cells}/uL (ref 850–3900)
MCH: 31.2 pg (ref 27.0–33.0)
MCHC: 33.5 g/dL (ref 32.0–36.0)
MCV: 93.1 fL (ref 80.0–100.0)
MPV: 10.6 fL (ref 7.5–12.5)
Monocytes Relative: 7.7 %
Neutro Abs: 4989 cells/uL (ref 1500–7800)
Neutrophils Relative %: 72.3 %
PLATELETS: 220 10*3/uL (ref 140–400)
RBC: 5.22 10*6/uL (ref 4.20–5.80)
RDW: 13.6 % (ref 11.0–15.0)
Total Lymphocyte: 16.2 %
WBC: 6.9 10*3/uL (ref 3.8–10.8)
WBCMIX: 531 {cells}/uL (ref 200–950)

## 2017-09-19 LAB — COMPLETE METABOLIC PANEL WITH GFR
AG RATIO: 1.5 (calc) (ref 1.0–2.5)
ALT: 30 U/L (ref 9–46)
AST: 27 U/L (ref 10–35)
Albumin: 4.6 g/dL (ref 3.6–5.1)
Alkaline phosphatase (APISO): 86 U/L (ref 40–115)
BUN: 23 mg/dL (ref 7–25)
CALCIUM: 9.3 mg/dL (ref 8.6–10.3)
CO2: 36 mmol/L — ABNORMAL HIGH (ref 20–32)
Chloride: 100 mmol/L (ref 98–110)
Creat: 1.17 mg/dL (ref 0.70–1.18)
GFR, EST AFRICAN AMERICAN: 71 mL/min/{1.73_m2} (ref >=60)
GFR, EST NON AFRICAN AMERICAN: 61 mL/min/{1.73_m2} (ref >=60)
GLOBULIN: 3.1 g/dL (ref 1.9–3.7)
Glucose, Bld: 98 mg/dL (ref 65–99)
POTASSIUM: 4.2 mmol/L (ref 3.5–5.3)
Sodium: 141 mmol/L (ref 135–146)
Total Bilirubin: 0.6 mg/dL (ref 0.2–1.2)
Total Protein: 7.7 g/dL (ref 6.1–8.1)

## 2017-09-19 LAB — URIC ACID: Uric Acid, Serum: 3.5 mg/dL — ABNORMAL LOW (ref 4.0–8.0)

## 2017-09-21 NOTE — Progress Notes (Signed)
Uric acid 3.5.  UA WNL.  All other labs are WNL.

## 2017-10-10 NOTE — Progress Notes (Signed)
Cardiology Office Note    Date:  10/16/2017   ID:  Dennis Moon, DOB 10-25-1945, MRN [id number]  PCP:  [name], MD  Cardiologist: Dr. Martinique  Chief Complaint  Patient presents with  . Coronary Artery Disease    History of Present Illness:    Dennis Moon is a 72 y.o. male with past medical history of HTN, HLD, and Stage 3 CKD who is seen for follow up CAD. He is also followed by Rheumatology for gout, subacute cutaneous lupus erythematosus, Sjogren's, osteoarthritis, and DDD.   He was  admitted to [facility] from 7/26 - 12/13/2016 for evaluation of chest pain.   EKG was without acute changes but his troponin values peaked at 0.40. A cardiac catheterization was performed which showed 2-vessel obstructive CAD with 90% Prox-LAD stenosis and 80% Prox Cx stenosis. PCI was performed with placement of DES to both the LAD and LCx. He was started on DAPT with ASA and Brilinta along with BB and statin therapy.   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   On follow up today he is doing very well. 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 Not exercising as much. limited by back pain. He denies any chest pain, dyspnea, palpitations, or edema.     Past Medical History:  Diagnosis Date  . CAD S/P percutaneous coronary angioplasty 12/12/2016   a. 11/2016: NSTEMI with DES to LAD and DES to LCx  . Gout   . Hypertension   . Hypothyroidism   . Migraine   . Sjogren's disease ([place])   . Systemic lupus erythematosus ([place])     Past Surgical History:  Procedure Laterality Date  . CATARACT EXTRACTION, BILATERAL Bilateral 2017  . CORONARY STENT INTERVENTION N/A 12/12/2016   Procedure: Coronary Stent Intervention;  Surgeon: Martinique, Rubel Heckard M, MD;  Location: [facility];  Service: Cardiovascular;  Laterality: N/A;  . HERNIA REPAIR    . LEFT HEART CATH AND CORONARY ANGIOGRAPHY N/A 12/12/2016   Procedure: Left Heart Cath and Coronary  Angiography;  Surgeon: Martinique, Crews Mccollam M, MD;  Location: [facility];  Service: Cardiovascular;  Laterality: N/A;  . THYROID SURGERY  1997    Current Medications: Outpatient Medications Prior to Visit  Medication Sig Dispense Refill  . acetaminophen (TYLENOL) 325 MG tablet Take 2 tablets (650 mg total) by mouth every 4 (four) hours as needed for headache or mild pain.    [name] allopurinol (ZYLOPRIM) 300 MG tablet TAKE 1 AND 1/2 TABLETS BY MOUTH DAILY 135 tablet 0  . amLODipine (NORVASC) 5 MG tablet Take 2.5 mg by mouth daily.    . Ascorbic Acid (VITAMIN C) 1000 MG tablet Take 1,000 mg by mouth daily.    [name] aspirin EC 81 MG EC tablet Take 1 tablet (81 mg total) by mouth daily.    [name] atorvastatin (LIPITOR) 80 MG tablet Take 1 tablet (80 mg total) by mouth daily at 6 PM. 90 tablet 3  . Calcium Carb-Cholecalciferol (CALCIUM 600 + D PO) Take 1 tablet by mouth daily.    [name] CRANBERRY EXTRACT PO Take 1 tablet by mouth daily.    . DULoxetine (CYMBALTA) 60 MG capsule TAKE 1 CAPSULE BY MOUTH DAILY 90 capsule 1  . furosemide (LASIX) 20 MG tablet TAKE 1 TABLET(20 MG) BY MOUTH DAILY 90 tablet 1  . hydroxychloroquine (PLAQUENIL) 200 MG tablet TAKE 1 TABLET BY MOUTH TWICE DAILY 180 tablet 0  .  levothyroxine (SYNTHROID, LEVOTHROID) 100 MCG tablet Take 1 tablet (100 mcg total) by mouth at bedtime. 90 tablet 1  . Multiple Vitamins-Minerals (MULTIVITAMIN PO) Take 1 tablet by mouth daily.    . nitroGLYCERIN (NITROSTAT) 0.4 MG SL tablet Place 1 tablet (0.4 mg total) under the tongue every 5 (five) minutes as needed for chest pain. 25 tablet 3  . pantoprazole (PROTONIX) 40 MG tablet Take 1 tablet (40 mg total) by mouth daily. 90 tablet 3  . Probiotic Product (PROBIOTIC PO) Take 1 capsule by mouth daily.    . propranolol (INDERAL) 60 MG tablet Take 1 tablet (60 mg total) by mouth 2 (two) times daily. 60 tablet 12  . testosterone (ANDROGEL) 50 MG/5GM (1%) GEL Place 5 g onto the skin 2 (two) times daily. 300 g 2  .  ticagrelor (BRILINTA) 90 MG TABS tablet Take 1 tablet (90 mg total) by mouth 2 (two) times daily. 180 tablet 3  . acyclovir ointment (ZOVIRAX) 5 % Apply 1 application topically as needed.     [name] amoxicillin (AMOXIL) 500 MG capsule Take 1 capsule (500 mg total) by mouth 3 (three) times daily. (Patient not taking: Reported on 09/18/2017) 30 capsule 0  . Hypromellose (ARTIFICIAL TEARS OP) Place 1 drop into both eyes as needed (for dry eyes).     No facility-administered medications prior to visit.      Allergies:   Bee venom   Social History   Socioeconomic History  . Marital status: Married    Spouse name: Not on file  . Number of children: Not on file  . Years of education: Not on file  . Highest education level: Not on file  Occupational History  . Not on file  Social Needs  . Financial resource strain: Not on file  . Food insecurity:    Worry: Not on file    Inability: Not on file  . Transportation needs:    Medical: Not on file    Non-medical: Not on file  Tobacco Use  . Smoking status: Former Smoker    Packs/day: 1.00    Years: 6.00    Pack years: 6.00    Last attempt to quit: 04/25/1966    Years since quitting: 51.5  . Smokeless tobacco: Never Used  Substance and Sexual Activity  . Alcohol use: Yes    Alcohol/week: 4.2 oz    Types: 7 Glasses of wine per week  . Drug use: Never  . Sexual activity: Not on file  Lifestyle  . Physical activity:    Days per week: Not on file    Minutes per session: Not on file  . Stress: Not on file  Relationships  . Social connections:    Talks on phone: Not on file    Gets together: Not on file    Attends religious service: Not on file    Active member of club or organization: Not on file    Attends meetings of clubs or organizations: Not on file    Relationship status: Not on file  Other Topics Concern  . Not on file  Social History Narrative  . Not on file     Family History:  The patient's family history includes Diabetes  in his mother; Hypertension in his father and mother.   Review of Systems:   Please see the history of present illness.     All other systems reviewed and are otherwise negative except as noted above.   Physical Exam:    VS:  BP 100/70   Pulse 73   Ht 6' (1.829 m)   Wt 233 lb (105.7 kg)   BMI 31.60 kg/m    GENERAL:  Well appearing, overweight WM in NAD HEENT:  PERRL, EOMI, sclera are clear. Oropharynx is clear. NECK:  No jugular venous distention, carotid upstroke brisk and symmetric, no bruits, no thyromegaly or adenopathy LUNGS:  Clear to auscultation bilaterally CHEST:  Unremarkable HEART:  RRR,  PMI not displaced or sustained,S1 and S2 within normal limits, no S3, no S4: no clicks, no rubs, no murmurs ABD:  Soft, nontender. BS +, no masses or bruits. No hepatomegaly, no splenomegaly EXT:  2 + pulses throughout, no edema, no cyanosis no clubbing SKIN:  Warm and dry.  No rashes NEURO:  Alert and oriented x 3. Cranial nerves II through XII intact. PSYCH:  Cognitively intact      Wt Readings from Last 3 Encounters:  10/16/17 233 lb (105.7 kg)  09/18/17 236 lb (107 kg)  06/11/17 230 lb 8 oz (104.6 kg)      Studies/Labs Reviewed:   EKG:  EKG is not ordered today.    Recent Labs: 12/12/2016: Magnesium 2.2; TSH 6.351 09/18/2017: ALT 30; BUN 23; Creat 1.17; Hemoglobin 16.3; Platelets 220; Potassium 4.2; Sodium 141   Lipid Panel    Component Value Date/Time   CHOL 115 01/23/2017 0922   TRIG 143 01/23/2017 0922   HDL 42 01/23/2017 0922   CHOLHDL 2.7 01/23/2017 0922   CHOLHDL 4.2 12/12/2016 0330   VLDL 33 12/12/2016 0330   LDLCALC 44 01/23/2017 0922    Additional studies/ records that were reviewed today include:   Echocardiogram: 12/12/2016 Study Conclusions  - Left ventricle: The cavity size was normal. Wall thickness was   increased in a pattern of mild LVH. Systolic function was normal.   The estimated ejection fraction was in the range of 60% to 65%.    Wall motion was normal; there were no regional wall motion   abnormalities. Doppler parameters are consistent with abnormal   left ventricular relaxation (grade 1 diastolic dysfunction). The   E/e&' ratio is <8, suggesting normal LV filling pressure. - Mitral valve: Mildly thickened leaflets . There was trivial   regurgitation. - Left atrium: The atrium was normal in size. - Tricuspid valve: There was trivial regurgitation. - Pulmonic valve: There was mild regurgitation. - Pulmonary arteries: PA peak pressure: 20 mm Hg (S). - Inferior vena cava: The vessel was normal in size. The   respirophasic diameter changes were in the normal range (>= 50%),   consistent with normal central venous pressure.  Impressions:  - LVEF 60-65%, mild LVH, normal wall motion, grade 1 DD, normal LV   filling pressure, normal LA size, trivial MR, TR, RVSP 20 mmHg,   normal IVC.  Cardiac Catheterization: 12/12/2016  Mid LAD lesion, 40 %stenosed.  The left ventricular systolic function is normal.  LV end diastolic pressure is normal.  The left ventricular ejection fraction is 55-65% by visual estimate.  Prox LAD lesion, 90 %stenosed.  A STENT PROMUS PREM MR 4.0X20 drug eluting stent was successfully placed.  Post intervention, there is a 0% residual stenosis.  Ost Cx to Prox Cx lesion, 80 %stenosed.  A STENT XIENCE ALPINE RX 3.0X18 drug eluting stent was successfully placed.  Post intervention, there is a 0% residual stenosis.   1. 2 vessel obstructive CAD 2. Normal LV function 3. Normal LVEDP 4. Successful stenting of the proximal LAD with DES 5. Successful stenting  of the proximal LCx with DES.  Plan: DAPT for one year. Anticipate DC in am.    Assessment:    1. Coronary artery disease involving native coronary artery of native heart without angina pectoris   2. Essential hypertension   3. Hyperlipidemia LDL goal <70   4. CKD (chronic kidney disease) stage 3, GFR 30-59 ml/min (HCC)       Plan:   In order of problems listed above:  1. CAD s/p NSTEMI - s/p NSTEMI in July 2018.  PCI was performed with placement of DES to both the LAD and LCx. Echo showed a preserved EF of 60-65% with no regional WMA.  - He is asymptomatic. - continue on DAPT with ASA and Brilinta for one year along with BB and statin therapy. May stop Brilinta at the end of July - I will follow up in 6 months.  2. HTN - well controlled on amlodipine and propranolol.   3. HLD - well controlled on high dose statin. LDL at goal. 44. Will repeat fasting lab next visit.   4. Stage 3 CKD - creatinine stable. Followed by Dr. Joelyn Oms.   Signed, Annelisa Ryback Martinique, MD  10/16/2017 8:30 AM    [facility] [address], [place], [place] [address] Phone: [phone]

## 2017-10-16 ENCOUNTER — Telehealth: Payer: Self-pay | Admitting: Cardiology

## 2017-10-16 ENCOUNTER — Ambulatory Visit: Payer: Medicare Other | Admitting: Cardiology

## 2017-10-16 ENCOUNTER — Encounter: Payer: Self-pay | Admitting: Cardiology

## 2017-10-16 VITALS — BP 100/70 | HR 73 | Ht 72.0 in | Wt 233.0 lb

## 2017-10-16 DIAGNOSIS — E785 Hyperlipidemia, unspecified: Secondary | ICD-10-CM | POA: Diagnosis not present

## 2017-10-16 DIAGNOSIS — N183 Chronic kidney disease, stage 3 unspecified: Secondary | ICD-10-CM

## 2017-10-16 DIAGNOSIS — I10 Essential (primary) hypertension: Secondary | ICD-10-CM | POA: Diagnosis not present

## 2017-10-16 DIAGNOSIS — I2510 Atherosclerotic heart disease of native coronary artery without angina pectoris: Secondary | ICD-10-CM

## 2017-10-16 MED ORDER — ACYCLOVIR 5 % EX OINT: 1.0000 "application " | TOPICAL_OINTMENT | CUTANEOUS | 0 refills | Status: DC | PRN

## 2017-10-16 NOTE — Telephone Encounter (Signed)
New Message:      Pt c/o medication issue:  1. Name of Medication: acyclovir ointment (ZOVIRAX) 5 %  2. How are you currently taking this medication (dosage and times per day)? Apply 1 application topically as needed.  3. Are you having a reaction (difficulty breathing--STAT)? No  4. What is your medication issue? [place] is needing to clarify how the pt is supposed to use this medication.

## 2017-10-16 NOTE — Patient Instructions (Addendum)
Continue your current therapy  You can stop Brilinta at the end of July  I will see you in 6 months.

## 2017-10-16 NOTE — Telephone Encounter (Signed)
Pharmacy has been instructed that the patient may take the medication 3-5 times a day as needed for a cold sore.

## 2017-10-20 ENCOUNTER — Other Ambulatory Visit: Payer: Self-pay | Admitting: Family Medicine

## 2017-11-03 ENCOUNTER — Other Ambulatory Visit: Payer: Self-pay | Admitting: Family Medicine

## 2017-11-03 DIAGNOSIS — E291 Testicular hypofunction: Secondary | ICD-10-CM

## 2017-11-04 NOTE — Telephone Encounter (Signed)
SS-Plz see refill req/thx dmf

## 2017-11-12 ENCOUNTER — Other Ambulatory Visit: Payer: Self-pay | Admitting: Rheumatology

## 2017-11-12 ENCOUNTER — Other Ambulatory Visit: Payer: Self-pay | Admitting: Family Medicine

## 2017-11-12 DIAGNOSIS — E291 Testicular hypofunction: Secondary | ICD-10-CM

## 2017-11-13 NOTE — Telephone Encounter (Signed)
Last Visit: 09/18/17 Next Visit: 03/26/18 Labs: 09/18/17 Uric acid 3.5. UA WNL. All other labs are WNL. PLQ Eye Exam: 12/2016 was normal  Okay to refill per Dr. Estanislado Pandy

## 2017-11-16 ENCOUNTER — Other Ambulatory Visit: Payer: Self-pay | Admitting: Family Medicine

## 2017-11-16 DIAGNOSIS — E291 Testicular hypofunction: Secondary | ICD-10-CM

## 2017-11-17 ENCOUNTER — Ambulatory Visit: Payer: Medicare Other | Admitting: Family Medicine

## 2017-11-17 ENCOUNTER — Encounter: Payer: Self-pay | Admitting: Family Medicine

## 2017-11-17 VITALS — BP 126/80 | HR 110 | Ht 72.0 in | Wt 234.1 lb

## 2017-11-17 DIAGNOSIS — E039 Hypothyroidism, unspecified: Secondary | ICD-10-CM | POA: Diagnosis not present

## 2017-11-17 DIAGNOSIS — E291 Testicular hypofunction: Secondary | ICD-10-CM | POA: Insufficient documentation

## 2017-11-17 DIAGNOSIS — Z79899 Other long term (current) drug therapy: Secondary | ICD-10-CM | POA: Diagnosis not present

## 2017-11-17 LAB — CBC
HCT: 46.2 % (ref 39.0–52.0)
Hemoglobin: 15.2 g/dL (ref 13.0–17.0)
MCHC: 32.9 g/dL (ref 30.0–36.0)
MCV: 95 fl (ref 78.0–100.0)
PLATELETS: 207 10*3/uL (ref 150.0–400.0)
RBC: 4.87 Mil/uL (ref 4.22–5.81)
RDW: 15.2 % (ref 11.5–15.5)
WBC: 5.8 10*3/uL (ref 4.0–10.5)

## 2017-11-17 LAB — TESTOSTERONE: TESTOSTERONE: 343.68 ng/dL (ref 300.00–890.00)

## 2017-11-17 MED ORDER — TESTOSTERONE 50 MG/5GM (1%) TD GEL: 5.0000 g | Freq: Two times a day (BID) | TRANSDERMAL | 5 refills | Status: DC

## 2017-11-17 NOTE — Patient Instructions (Signed)
Testosterone Replacement Therapy Testosterone replacement therapy (TRT) is used to treat men who have a low testosterone level (hypogonadism). Testosterone is a male hormone that is produced in the testicles. It is responsible for typically male characteristics and for maintaining a man's sex drive and the ability to get an erection. Testosterone also supports bone and muscle health. TRT can be a gel, liquid, or patch that you put on your skin. It can also be in the form of a tablet or an injection. In some cases, your health care provider may insert long-acting pellets under your skin. In most men, the level of testosterone starts to decline gradually after age 52. Low testosterone can also be caused by certain medical conditions, medicines, and obesity. Your health care provider can diagnose hypogonadism with at least two blood tests that are done early in the morning. Low testosterone may not need to be treated. TRT is usually a choice that you make with your health care provider. Your health care provider may recommend TRT if you have low testosterone that is causing symptoms, such as:  Low sex drive.  Erection problems.  Breast enlargement.  Loss of body hair.  Weak muscles or bones.  Shrinking testicles.  Increased body fat.  Low energy.  Hot flashes.  Depression.  Decreased work Systems analyst.  TRT is a lifetime treatment. If you stop treatment, your testosterone will drop, and your symptoms may return. What are the risks? Testosterone replacement therapy may have side effects, including:  Lower sperm count.  Skin irritation at the application or injection site.  Mouth irritation if you take an oral tablet.  Acne.  Swelling of your legs or feet.  Tender breasts.  Dizziness.  Sleep disturbance.  Mood swings.  Possible increased risk of stroke or heart attack.  Testosterone replacement therapy may also increase your risk for prostate cancer or male breast cancer.  You should not use TRT if you have either of those conditions. Your health care provider also may not recommend TRT if:  You are suspected of having prostate cancer.  You want to father a child.  You have a high number of red blood cells.  You have untreated sleep apnea.  You have a very large prostate.  Supplies needed:  Your health care provider will prescribe the testosterone gel, solution, or medicine that you need. If your health care provider teaches you to do self-injections at home, you will also need: ? Your medicine vial. ? Disposable needles and syringes. ? Alcohol swabs. ? A needle disposal container. ? Adhesive bandages. How to use testosterone replacement therapy Your health care provider will help you find the TRT option that will work best for you based on your preference, the side effects, and the cost. You may:  Rub testosterone gel on your upper arm or shoulder every day after a shower. This is the most common type of TRT. Do not let women or children come in contact with the gel.  Apply a testosterone solution under your arms once each day.  Place a testosterone patch on your skin once each day.  Dissolve a testosterone tablet in your mouth twice each day.  Have a testosterone pellet inserted under your skin by your health care provider. This will be replaced every 3-6 months.  Use testosterone nasal spray three times each day.  Get testosterone injections. For some types of testosterone, your health care provider will give you this injection. With other types of testosterone, you may be taught to give  injections to yourself. The frequency of injections may vary based on the type of testosterone that you receive.  Follow these instructions at home:  Take over-the-counter and prescription medicines only as told by your health care provider.  Lose weight if you are overweight. Ask your health care provider to help you start a healthy diet and exercise  program to reach and maintain a healthy weight.  Work with your health care provider to treat other medical conditions that may lower your testosterone. These include obesity, high blood pressure, high cholesterol, diabetes, liver disease, kidney disease, and sleep apnea.  Keep all follow-up visits as told by your health care provider. This is important. General recommendations  Discuss all risks and benefits with your health care provider before starting therapy.  Work with your health care provider to check your prostate health and do blood testing before you start therapy.  Do not use any testosterone replacement therapies that are not prescribed by your health care provider or not approved for use in the [place]  Do not use TRT for bodybuilding or to improve sexual performance. TRT should be used only to treat symptoms of low testosterone.  Return for all repeat prostate checks and blood tests during therapy, as told by your health care provider. Where to find more information: Learn more about testosterone replacement therapy from:  Energy: www.urologyhealth.org/urologic-conditions/low-testosterone-(hypogonadism)  Endocrine Society: www.hormone.org/diseases-and-conditions/mens-health/hypogonadism  Contact a health care provider if:  You have side effects from your testosterone replacement therapy.  You continue to have symptoms of low testosterone during treatment.  You develop new symptoms during treatment. Summary  Testosterone replacement therapy is only for men who have low testosterone as determined by blood testing and who have symptoms of low testosterone.  Testosterone replacement therapy should be prescribed only by a health care provider and should be used under the supervision of a health care provider.  You may not be able to take testosterone if you have certain medical conditions, including prostate cancer, male breast cancer, or heart  disease.  Testosterone replacement therapy may have side effects and may make some medical conditions worse.  Talk with your health care provider about all the risks and benefits before you start therapy. This information is not intended to replace advice given to you by your health care provider. Make sure you discuss any questions you have with your health care provider. Document Released: 01/24/2016 Document Revised: 01/24/2016 Document Reviewed: 01/24/2016 Elsevier Interactive Patient Education  2018 [employer].

## 2017-11-17 NOTE — Progress Notes (Signed)
Subjective:  Patient ID: Dennis Moon, male    DOB: April 11, 1946  Age: 72 y.o. 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  Has been on exogenous testosterone for years.  It helps him with his energy levels.  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  He tells me that his urine flow is great.  PSAs have been normal.  CBCs have also been normal.  He is seeing cardiology for management of his coronary artery disease status post stent placement.  He tells me that he will be able to discontinue Brilinta later this month.  Continues to see rheumatology.  They are managing his Cymbalta.  No recent TSH.   Outpatient Medications Prior to Visit  Medication Sig Dispense Refill  . acetaminophen (TYLENOL) 325 MG tablet Take 2 tablets (650 mg total) by mouth every 4 (four) hours as needed for headache or mild pain.    [name] acyclovir ointment (ZOVIRAX) 5 % Apply 1 application topically as needed. 15 g 0  . allopurinol (ZYLOPRIM) 300 MG tablet TAKE 1 AND 1/2 TABLETS BY MOUTH DAILY 135 tablet 0  . amLODipine (NORVASC) 5 MG tablet Take 2.5 mg by mouth daily.    . Ascorbic Acid (VITAMIN C) 1000 MG tablet Take 1,000 mg by mouth daily.    [name] aspirin EC 81 MG EC tablet Take 1 tablet (81 mg total) by mouth daily.    [name] atorvastatin (LIPITOR) 80 MG tablet Take 1 tablet (80 mg total) by mouth daily at 6 PM. 90 tablet 3  . Calcium Carb-Cholecalciferol (CALCIUM 600 + D PO) Take 1 tablet by mouth daily.    [name] CRANBERRY EXTRACT PO Take 1 tablet by mouth daily.    . DULoxetine (CYMBALTA) 60 MG capsule TAKE 1 CAPSULE BY MOUTH DAILY 90 capsule 1  . furosemide (LASIX) 20 MG tablet TAKE 1 TABLET(20 MG) BY MOUTH DAILY 90 tablet 1  . hydroxychloroquine (PLAQUENIL) 200 MG tablet TAKE 1 TABLET BY MOUTH TWICE DAILY 180 tablet 0  . levothyroxine (SYNTHROID, LEVOTHROID) 100 MCG tablet TAKE 1 TABLET(100 MCG) BY MOUTH AT BEDTIME 90 tablet 1  . Multiple  Vitamins-Minerals (MULTIVITAMIN PO) Take 1 tablet by mouth daily.    . nitroGLYCERIN (NITROSTAT) 0.4 MG SL tablet Place 1 tablet (0.4 mg total) under the tongue every 5 (five) minutes as needed for chest pain. 25 tablet 3  . pantoprazole (PROTONIX) 40 MG tablet Take 1 tablet (40 mg total) by mouth daily. 90 tablet 3  . Probiotic Product (PROBIOTIC PO) Take 1 capsule by mouth daily.    . propranolol (INDERAL) 60 MG tablet Take 1 tablet (60 mg total) by mouth 2 (two) times daily. 60 tablet 12  . ticagrelor (BRILINTA) 90 MG TABS tablet Take 1 tablet (90 mg total) by mouth 2 (two) times daily. 180 tablet 3  . testosterone (ANDROGEL) 50 MG/5GM (1%) GEL Place 5 g onto the skin 2 (two) times daily. 300 g 2   No facility-administered medications prior to visit.     ROS Review of Systems  Constitutional: Negative.   HENT: Negative.   Eyes: Negative.   Respiratory: Negative for chest tightness, shortness of breath and wheezing.   Cardiovascular: Negative for chest pain and palpitations.  Gastrointestinal: Negative.   Endocrine: Negative for cold intolerance and heat intolerance.  Genitourinary: Negative for decreased urine volume, difficulty urinating and frequency.  Skin: Negative for pallor and rash.  Allergic/Immunologic: Negative  for immunocompromised state.  Neurological: Negative for weakness and headaches.  Hematological: Negative for adenopathy.  Psychiatric/Behavioral: Negative.     Objective:  BP 126/80   Pulse (!) 110   Ht 6' (1.829 m)   Wt 234 lb 2 oz (106.2 kg)   SpO2 93%   BMI 31.75 kg/m   BP Readings from Last 3 Encounters:  11/17/17 126/80  10/16/17 100/70  09/18/17 105/71    Wt Readings from Last 3 Encounters:  11/17/17 234 lb 2 oz (106.2 kg)  10/16/17 233 lb (105.7 kg)  09/18/17 236 lb (107 kg)    Physical Exam  Constitutional: He is oriented to person, place, and time. He appears well-developed and well-nourished. No distress.  HENT:  Head: Normocephalic  and atraumatic.  Right Ear: External ear normal.  Left Ear: External ear normal.  Mouth/Throat: Oropharynx is clear and moist. No oropharyngeal exudate.  Eyes: Pupils are equal, round, and reactive to light. Conjunctivae and EOM are normal. Right eye exhibits no discharge. Left eye exhibits no discharge. No scleral icterus.  Neck: Neck supple. No JVD present. No tracheal deviation present. No thyromegaly present.  Cardiovascular: Normal rate, regular rhythm and normal heart sounds.  Pulmonary/Chest: Effort normal and breath sounds normal.  Abdominal: Bowel sounds are normal.  Lymphadenopathy:    He has no cervical adenopathy.  Neurological: He is alert and oriented to person, place, and time.  Skin: Skin is warm and dry. He is not diaphoretic.  Psychiatric: He has a normal mood and affect. His behavior is normal.    Lab Results  Component Value Date   WBC 6.9 09/18/2017   HGB 16.3 09/18/2017   HCT 48.6 09/18/2017   PLT 220 09/18/2017   GLUCOSE 98 09/18/2017   CHOL 115 01/23/2017   TRIG 143 01/23/2017   HDL 42 01/23/2017   LDLCALC 44 01/23/2017   ALT 30 09/18/2017   AST 27 09/18/2017   NA 141 09/18/2017   K 4.2 09/18/2017   CL 100 09/18/2017   CREATININE 1.17 09/18/2017   BUN 23 09/18/2017   CO2 36 (H) 09/18/2017   TSH 6.351 (H) 12/12/2016   PSA 1.46 04/22/2017   INR 1.00 12/12/2016    Dg Chest 2 View  Result Date: 12/11/2016 CLINICAL DATA:  Mid chest pain with shortness of breath for 2 days. EXAM: CHEST  2 VIEW COMPARISON:  None. FINDINGS: The heart size and mediastinal contours are within normal limits. There is elevation of right hemidiaphragm. Minimal atelectasis of right lung base is noted. Both lungs are clear. Degenerative joint changes of the spine are noted. IMPRESSION: No active cardiopulmonary disease. Elevation of right hemidiaphragm with minimal atelectasis of right lung base. Electronically Signed   By: Abelardo Diesel M.D.   On: 12/11/2016 20:37    Assessment  & Plan:   Dennis Moon was seen today for medication refill.  Diagnoses and all orders for this visit:  Androgen deficiency -     CBC -     testosterone (ANDROGEL) 50 MG/5GM (1%) GEL; Place 5 g onto the skin 2 (two) times daily. -     Testosterone  Acquired hypothyroidism -     CBC  High risk medication use -     CBC   I am having Dennis Moon. Napp maintain his amLODipine, Multiple Vitamins-Minerals (MULTIVITAMIN PO), Probiotic Product (PROBIOTIC PO), acetaminophen, aspirin, atorvastatin, pantoprazole, nitroGLYCERIN, ticagrelor, propranolol, furosemide, DULoxetine, Calcium Carb-Cholecalciferol (CALCIUM 600 + D PO), vitamin C, CRANBERRY EXTRACT PO, acyclovir ointment, levothyroxine, hydroxychloroquine, allopurinol,  and testosterone.  Meds ordered this encounter  Medications  . testosterone (ANDROGEL) 50 MG/5GM (1%) GEL    Sig: Place 5 g onto the skin 2 (two) times daily.    Dispense:  300 g    Refill:  5     Follow-up: No follow-ups on file.  [name], MD

## 2017-11-18 ENCOUNTER — Ambulatory Visit: Payer: Self-pay | Admitting: *Deleted

## 2017-11-18 NOTE — Telephone Encounter (Signed)
Sent for you.Marland KitchenMarland Kitchen

## 2017-11-18 NOTE — Telephone Encounter (Signed)
Patient phoned to report his resting heart rate today is 120 bpm. It has been this most of the day. His 02 sat is 90%. He was seen by Dr. Ethelene Hal yesterday. Heart rate was 110 bpm at appointment. He stated he forgot to report that his hands were trembling yesterday and "hands are shaky" today. He also reports mild chest and nasal congestion today and feels he is not getting a good deep breath. He denies CP and fever. Stated he is eating and drinking and working as normal, concerned mostly about his elevated heart rate and shaky hands today. Heart rate has been in the 70's at prior visits.  He would like Dr. Bebe Shaggy thoughts on the heart rate and shaky hands.  Please advise patient.  Answer Assessment - Initial Assessment Questions 1. REASON FOR CALL or QUESTION: "What is your reason for calling today?" or "How can I best help you?" or "What question do you have that I can help answer?"     Resting heart rate is 120 bpm  Protocols used: INFORMATION ONLY CALL-A-AH

## 2017-11-20 NOTE — Telephone Encounter (Signed)
I called and spoke with patient. Patient stated that his heart rate has gone back to normal. Patient said that he did receive the 2nd Shingles vaccine around the time and he did experience some flu-like symptoms with the vaccine. I advised patient to keep an eye on his heart rate and to let us know if it starts to elevate again. Patient verbalized understanding.

## 2017-11-20 NOTE — Telephone Encounter (Signed)
Needs to go to urgent care or er.

## 2017-11-27 ENCOUNTER — Ambulatory Visit: Payer: Self-pay | Admitting: Family Medicine

## 2017-12-03 ENCOUNTER — Other Ambulatory Visit: Payer: Self-pay | Admitting: Cardiology

## 2017-12-03 MED ORDER — ATORVASTATIN CALCIUM 80 MG PO TABS: 80.0000 mg | ORAL_TABLET | Freq: Every day | ORAL | 3 refills | Status: DC

## 2017-12-03 NOTE — Telephone Encounter (Signed)
New message    Patient only has 3 pills remaining.    1. Which medications need to be refilled? (please list name of each medication and dose if known) atorvastatin (LIPITOR) 80 MG tablet  2. Which pharmacy/location (including street and city if local pharmacy) is medication to be sent to?[id number] - [place], [place] - [address]  3. Do they need a 30 day or 90 day supply? [place]

## 2017-12-04 NOTE — Telephone Encounter (Signed)
Rx sent to pharmacy   

## 2018-01-21 ENCOUNTER — Other Ambulatory Visit: Payer: Self-pay | Admitting: Family Medicine

## 2018-02-01 ENCOUNTER — Other Ambulatory Visit: Payer: Self-pay | Admitting: Rheumatology

## 2018-02-01 NOTE — Telephone Encounter (Signed)
Last Visit: 09/18/17 Next Visit: 03/26/18 Labs: 09/18/17 Uric acid 3.5. UA WNL. All other labs are WNL.  Okay to refill per Dr. Estanislado Pandy

## 2018-02-14 ENCOUNTER — Other Ambulatory Visit: Payer: Self-pay | Admitting: Family Medicine

## 2018-02-14 ENCOUNTER — Other Ambulatory Visit: Payer: Self-pay | Admitting: Rheumatology

## 2018-02-15 NOTE — Telephone Encounter (Signed)
Last Visit: 09/18/17 Next Visit: 03/26/18 Labs: 5/3/19Uric acid 3.5. UA WNL. All other labs are WNL. PLQ Eye Exam: 12/2016 was normal  Okay to refill per Dr. Estanislado Pandy

## 2018-03-02 ENCOUNTER — Other Ambulatory Visit: Payer: Self-pay | Admitting: *Deleted

## 2018-03-02 MED ORDER — PANTOPRAZOLE SODIUM 40 MG PO TBEC: 40.0000 mg | DELAYED_RELEASE_TABLET | Freq: Every day | ORAL | 1 refills | Status: DC

## 2018-03-22 NOTE — Progress Notes (Signed)
Office Visit Note  Patient: Dennis Moon             Date of Birth: 1946/02/16           MRN: [id number]             PCP: [name], MD Referring: [name],* Visit Date: 04/02/2018 Occupation: @GUAROCC @  Subjective:  Medication monitoring    History of Present Illness: Dennis Moon is a 72 y.o. male  with history of gout, subacute cutaneous lupus erythematosus, Sjogren's, osteoarthritis, and DDD.  He is taking Plaquenil 200 mg 1 tablet twice daily.  He is on allopurinol 450 mg p.o. Daily.   He has not had any gout flares in several years.  He has not needed to take colchicine in years.  He continues to have sicca symptoms.  He reports his eye dryness is more severe than the mouth dryness.  He continues to get his teeth cleaned regularly and denies any recent dental caries.  He uses eye drops 2-3 times per day.  He states he has photophobia frequently and has to wear sunglasses regularly.  He denies any recent rashes or subacute cutaneous lupus. He states he has joint stiffness worse in the morning and after sitting for prolonged periods of time.  He states he has chronic intermittent lower back pain due to bulging discs.  He states he has some numbness in the left lower extremity.  He states he has been doing balance exercises and denies any recent falls.  He denies the lower back pain interfering with work on waking him up at night.    Activities of Daily Living:  Patient reports morning stiffness for 1 hour.   Patient Denies nocturnal pain.  Difficulty dressing/grooming: Denies Difficulty climbing stairs: Denies Difficulty getting out of chair: Denies Difficulty using hands for taps, buttons, cutlery, and/or writing: Denies  Review of Systems  Constitutional: Negative for fatigue and night sweats.  HENT: Positive for mouth dryness. Negative for mouth sores, trouble swallowing, trouble swallowing and nose dryness.   Eyes: Positive for dryness.  Negative for pain, redness and itching.  Respiratory: Negative for cough, hemoptysis, shortness of breath, wheezing and difficulty breathing.   Cardiovascular: Negative for chest pain, palpitations, hypertension, irregular heartbeat and swelling in legs/feet.  Gastrointestinal: Negative for abdominal pain, constipation, diarrhea, nausea and vomiting.  Endocrine: Negative for increased urination.  Genitourinary: Negative for painful urination, pelvic pain and urgency.  Musculoskeletal: Positive for arthralgias, joint pain and morning stiffness. Negative for joint swelling, myalgias, muscle weakness, muscle tenderness and myalgias.  Skin: Negative for color change, rash, hair loss, nodules/bumps, skin tightness, ulcers and sensitivity to sunlight.  Allergic/Immunologic: Negative for susceptible to infections.  Neurological: Negative for dizziness, fainting, light-headedness, headaches, memory loss, night sweats and weakness.  Hematological: Negative for bruising/bleeding tendency and swollen glands.  Psychiatric/Behavioral: Negative for depressed mood, confusion and sleep disturbance. The patient is not nervous/anxious.     PMFS History:  Patient Active Problem List   Diagnosis Date Noted  . Androgen deficiency 11/17/2017  . Acquired hypothyroidism 11/17/2017  . Cyst of tonsil 06/11/2017  . Obesity (BMI 30.0-34.9) 03/12/2017  . CAD S/P percutaneous coronary angioplasty 12/13/2016  . NSTEMI (non-ST elevated myocardial infarction) ([place]) 12/11/2016  . Gout of multiple sites 08/26/2016  . Chronic renal impairment 08/26/2016  . Chronic kidney disease 08/26/2016  . Sjogren's syndrome ([place]) 04/23/2016  . 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 Idiopathic chronic gout, unspecified site, without tophus (tophi) 04/23/2016  .  High risk medication use 04/23/2016  . Primary osteoarthritis of both hands 04/23/2016  . DDD (degenerative disc disease), lumbar 04/23/2016  . Kidney disease 04/23/2016    Past  Medical History:  Diagnosis Date  . CAD S/P percutaneous coronary angioplasty 12/12/2016   a. 11/2016: NSTEMI with DES to LAD and DES to LCx  . Gout   . Hypertension   . Hypothyroidism   . Migraine   . Sjogren's disease ([place])   . Systemic lupus erythematosus ([facility])     Family History  Problem Relation Age of Onset  . Diabetes Mother   . Hypertension Mother   . Hypertension Father   . Healthy Son    Past Surgical History:  Procedure Laterality Date  . CATARACT EXTRACTION, BILATERAL Bilateral 2017  . CORONARY STENT INTERVENTION N/A 12/12/2016   Procedure: Coronary Stent Intervention;  Surgeon: Martinique, Peter M, MD;  Location: [facility];  Service: Cardiovascular;  Laterality: N/A;  . HERNIA REPAIR    . LEFT HEART CATH AND CORONARY ANGIOGRAPHY N/A 12/12/2016   Procedure: Left Heart Cath and Coronary Angiography;  Surgeon: Martinique, Peter M, MD;  Location: [facility];  Service: Cardiovascular;  Laterality: N/A;  . THYROID SURGERY  1997   Social History   Social History Narrative  . Not on file    Objective: Vital Signs: BP 124/72 (BP Location: Right Arm, Patient Position: Sitting, Cuff Size: Normal)   Pulse 61   Resp 14   Ht 6' (1.829 m)   Wt 236 lb 9.6 oz (107.3 kg)   BMI 32.09 kg/m    Physical Exam  Constitutional: He is oriented to person, place, and time. He appears well-developed and well-nourished.  HENT:  Head: Normocephalic and atraumatic.  Eyes: Pupils are equal, round, and reactive to light. Conjunctivae and EOM are normal.  Neck: Normal range of motion. Neck supple.  Cardiovascular: Normal rate, regular rhythm and normal heart sounds.  Pulmonary/Chest: Effort normal and breath sounds normal.  Abdominal: Soft. Bowel sounds are normal.  Lymphadenopathy:    He has no cervical adenopathy.  Neurological: He is alert and oriented to person, place, and time.  Skin: Skin is warm and dry. Capillary refill takes less than 2 seconds.  Psychiatric: He has  a normal mood and affect. His behavior is normal.  Nursing note and vitals reviewed.    Musculoskeletal Exam: C-spine, thoracic spine, and lumbar spine good ROM.  No midline spinal tenderness. No SI joint tenderness.  Shoulder joints, elbow joints, wrist joints, MCPs, PIPs, and DIPs good ROM with no synovitis. PIP and DIP synovial thickening consistent with osteoarthritis of both hands.   Hip joints, knee joints, ankle joints, MTPs, PIPs, and DIPs good ROM with no synovitis.  No warmth or effusion of knee joints.  No tenderness or swelling of ankle joints.   CDAI Exam: CDAI Score: Not documented Patient Global Assessment: Not documented; Provider Global Assessment: Not documented Swollen: Not documented; Tender: Not documented Joint Exam   Not documented   There is currently no information documented on the homunculus. Go to the Rheumatology activity and complete the homunculus joint exam.  Investigation: No additional findings.  Imaging: No results found.  Recent Labs: Lab Results  Component Value Date   WBC 5.8 11/17/2017   HGB 15.2 11/17/2017   PLT 207.0 11/17/2017   NA 141 09/18/2017   K 4.2 09/18/2017   CL 100 09/18/2017   CO2 36 (H) 09/18/2017   GLUCOSE 98 09/18/2017  BUN 23 09/18/2017   CREATININE 1.17 09/18/2017   BILITOT 0.6 09/18/2017   ALKPHOS 85 01/23/2017   AST 27 09/18/2017   ALT 30 09/18/2017   PROT 7.7 09/18/2017   ALBUMIN 4.3 01/23/2017   CALCIUM 9.3 09/18/2017   GFRAA 71 09/18/2017    Speciality Comments: PLQ eye exam normal on 10/31/16 @ [facility].  Procedures:  No procedures performed Allergies: Bee venom      Assessment / Plan:     Visit Diagnoses: Idiopathic chronic gout of multiple sites without tophus -He has not had any gout flares in several years.  He does not need to take colchicine in several years.  He continues to take allopurinol 450 mg by mouth daily.  He has no joint pain or inflammation at this time  .Most recent  uric acid 3.5 on 09/18/17.    We will check a uric acid level today.  He does not need any refills of allopurinol at this time.  He is advised to notify us if he develops any signs or symptoms of a gout flare.- Plan: Uric acid  Subacute cutaneous lupus erythematosus: He has not had any recent recurrences of rashes.  He does continue to have photosensitivity.  He tries to avoid exposure to sunlight.  He continues to take Plaquenil 200 mg 1 tablet by mouth twice daily.  He does not need any refills of Plaquenil at this time.  He was given a Plaquenil eye exam form he can take with him to his next appointment.  Sjogren's syndrome with other organ involvement ([place]) Positive ANA, positive Ro, positive La. -He continues to have sicca symptoms.  His mouth dryness has been tolerable.  He continues to have dental cleanings on a regular basis and has not had any recent dental caries.  He has no parotid swelling on exam today.  His eye dryness is more severe but has been in the past.  He uses eyedrops 2-3 times per day.  He continues to see his optometrist on a regular basis.  He does report photophobia intermittently and wears sunglasses on a regular basis.  He is scheduled for his back and eye exam at the end of this month the beginning of December.  He was given a Plaquenil eye exam form today in the office.  CBC, CMP, and UA will be checked today.  Plan: CBC with Differential/Platelet, COMPLETE METABOLIC PANEL WITH GFR, Urinalysis, Routine w reflex microscopic  High risk medication use -  Plaquenil 200 mg by mouth twice a day.eye exam: 12/2016 was normal.  CBC and CMP will be drawn today to monitor for drug toxicity.  He was given a Plaquenil eye exam form to take to his next appointment.- Plan: CBC with Differential/Platelet, COMPLETE METABOLIC PANEL WITH GFR  Primary osteoarthritis of both hands: He has complete fist formation.  No synovitis noted.  He has pain and stiffness in both hands especially in the morning.   Joint protection muscle strengthening were discussed.  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  DDD (degenerative disc disease), lumbar: He has intermittent lower back pain.  He has radiation of pain and numbness down the left leg.  He has been working on balance exercises.  He has not had any falls recently.  He has not followed by a spine specialist anymore.  His back pain does not interfere with his work or wake him up at night.  Other medical conditions are listed  as follows:  CAD S/P percutaneous coronary angioplasty  History of chronic kidney disease   Orders: Orders Placed This Encounter  Procedures  . CBC with Differential/Platelet  . COMPLETE METABOLIC PANEL WITH GFR  . Uric acid  . Urinalysis, Routine w reflex microscopic   No orders of the defined types were placed in this encounter.     Follow-Up Instructions: Return in about 5 months (around 09/01/2018) for Gout, Sjogren's syndrome, Osteoarthritis, DDD.   Ofilia Neas, PA-C  Note - This record has been created using Dragon software.  Chart creation errors have been sought, but may not always  have been located. Such creation errors do not reflect on  the standard of medical care.

## 2018-03-26 ENCOUNTER — Ambulatory Visit: Payer: Self-pay | Admitting: Physician Assistant

## 2018-04-02 ENCOUNTER — Encounter: Payer: Self-pay | Admitting: Physician Assistant

## 2018-04-02 ENCOUNTER — Ambulatory Visit: Payer: Medicare Other | Admitting: Physician Assistant

## 2018-04-02 VITALS — BP 124/72 | HR 61 | Resp 14 | Ht 72.0 in | Wt 236.6 lb

## 2018-04-02 DIAGNOSIS — Z9861 Coronary angioplasty status: Secondary | ICD-10-CM

## 2018-04-02 DIAGNOSIS — L931 Subacute cutaneous lupus erythematosus: Secondary | ICD-10-CM | POA: Diagnosis not present

## 2018-04-02 DIAGNOSIS — M1A09X0 Idiopathic chronic gout, multiple sites, without tophus (tophi): Secondary | ICD-10-CM

## 2018-04-02 DIAGNOSIS — M19041 Primary osteoarthritis, right hand: Secondary | ICD-10-CM

## 2018-04-02 DIAGNOSIS — M3509 Sicca syndrome with other organ involvement: Secondary | ICD-10-CM

## 2018-04-02 DIAGNOSIS — Z79899 Other long term (current) drug therapy: Secondary | ICD-10-CM | POA: Diagnosis not present

## 2018-04-02 DIAGNOSIS — M19042 Primary osteoarthritis, left hand: Secondary | ICD-10-CM

## 2018-04-02 DIAGNOSIS — M5136 Other intervertebral disc degeneration, lumbar region: Secondary | ICD-10-CM

## 2018-04-02 DIAGNOSIS — I2510 Atherosclerotic heart disease of native coronary artery without angina pectoris: Secondary | ICD-10-CM

## 2018-04-02 DIAGNOSIS — Z87448 Personal history of other diseases of urinary system: Secondary | ICD-10-CM

## 2018-04-02 DIAGNOSIS — M51369 Other intervertebral disc degeneration, lumbar region without mention of lumbar back pain or lower extremity pain: Secondary | ICD-10-CM

## 2018-04-03 LAB — COMPLETE METABOLIC PANEL WITH GFR
AG RATIO: 1.3 (calc) (ref 1.0–2.5)
ALT: 29 U/L (ref 9–46)
AST: 30 U/L (ref 10–35)
Albumin: 4.1 g/dL (ref 3.6–5.1)
Alkaline phosphatase (APISO): 72 U/L (ref 40–115)
BILIRUBIN TOTAL: 0.4 mg/dL (ref 0.2–1.2)
BUN/Creatinine Ratio: 18 (calc) (ref 6–22)
BUN: 24 mg/dL (ref 7–25)
CALCIUM: 9 mg/dL (ref 8.6–10.3)
CHLORIDE: 102 mmol/L (ref 98–110)
CO2: 31 mmol/L (ref 20–32)
Creat: 1.32 mg/dL — ABNORMAL HIGH (ref 0.70–1.18)
GFR, EST AFRICAN AMERICAN: 62 mL/min/{1.73_m2} (ref >=60)
GFR, Est Non African American: 53 mL/min/{1.73_m2} — ABNORMAL LOW (ref >=60)
GLUCOSE: 95 mg/dL (ref 65–99)
Globulin: 3.2 g/dL (calc) (ref 1.9–3.7)
POTASSIUM: 4.1 mmol/L (ref 3.5–5.3)
Sodium: 143 mmol/L (ref 135–146)
TOTAL PROTEIN: 7.3 g/dL (ref 6.1–8.1)

## 2018-04-03 LAB — CBC WITH DIFFERENTIAL/PLATELET
BASOS ABS: 38 {cells}/uL (ref 0–200)
Basophils Relative: 0.6 %
EOS PCT: 3 %
Eosinophils Absolute: 189 cells/uL (ref 15–500)
HEMATOCRIT: 43 % (ref 38.5–50.0)
Hemoglobin: 14.4 g/dL (ref 13.2–17.1)
Lymphs Abs: 1292 cells/uL (ref 850–3900)
MCH: 31.3 pg (ref 27.0–33.0)
MCHC: 33.5 g/dL (ref 32.0–36.0)
MCV: 93.5 fL (ref 80.0–100.0)
MONOS PCT: 9.5 %
MPV: 11.1 fL (ref 7.5–12.5)
NEUTROS PCT: 66.4 %
Neutro Abs: 4183 cells/uL (ref 1500–7800)
Platelets: 206 10*3/uL (ref 140–400)
RBC: 4.6 10*6/uL (ref 4.20–5.80)
RDW: 12.4 % (ref 11.0–15.0)
Total Lymphocyte: 20.5 %
WBC mixed population: 599 cells/uL (ref 200–950)
WBC: 6.3 10*3/uL (ref 3.8–10.8)

## 2018-04-03 LAB — URINALYSIS, ROUTINE W REFLEX MICROSCOPIC
Bilirubin Urine: NEGATIVE
Glucose, UA: NEGATIVE
HGB URINE DIPSTICK: NEGATIVE
KETONES UR: NEGATIVE
LEUKOCYTES UA: NEGATIVE
NITRITE: NEGATIVE
Protein, ur: NEGATIVE
Specific Gravity, Urine: 1.018 (ref 1.001–1.03)
pH: <=5 (ref 5.0–8.0)

## 2018-04-03 LAB — URIC ACID: Uric Acid, Serum: 4.4 mg/dL (ref 4.0–8.0)

## 2018-04-05 NOTE — Progress Notes (Signed)
Creatinine elevated-1.32 and GFR low-53. Reviewed labs with Dr. Estanislado Pandy.  Please advise patient to avoid NSAIDs.  Forward labs to PCP.  CBC WNL. Uric acid is within desirable range. UA normal.

## 2018-04-13 ENCOUNTER — Encounter: Payer: Self-pay | Admitting: *Deleted

## 2018-04-21 ENCOUNTER — Other Ambulatory Visit: Payer: Self-pay

## 2018-04-21 DIAGNOSIS — I2510 Atherosclerotic heart disease of native coronary artery without angina pectoris: Secondary | ICD-10-CM

## 2018-04-21 DIAGNOSIS — Z9861 Coronary angioplasty status: Principal | ICD-10-CM

## 2018-04-25 ENCOUNTER — Other Ambulatory Visit: Payer: Self-pay | Admitting: Family Medicine

## 2018-04-28 LAB — HEPATIC FUNCTION PANEL
ALT: 31 IU/L (ref 0–44)
AST: 33 IU/L (ref 0–40)
Albumin: 4.2 g/dL (ref 3.5–4.8)
Alkaline Phosphatase: 74 IU/L (ref 39–117)
Bilirubin Total: 0.3 mg/dL (ref 0.0–1.2)
Bilirubin, Direct: 0.1 mg/dL (ref 0.00–0.40)
Total Protein: 6.8 g/dL (ref 6.0–8.5)

## 2018-04-28 LAB — LIPID PANEL
Chol/HDL Ratio: 2.3 ratio (ref 0.0–5.0)
Cholesterol, Total: 112 mg/dL (ref 100–199)
HDL: 48 mg/dL (ref >39)
LDL Calculated: 48 mg/dL (ref 0–99)
Triglycerides: 80 mg/dL (ref 0–149)
VLDL CHOLESTEROL CAL: 16 mg/dL (ref 5–40)

## 2018-04-28 LAB — BASIC METABOLIC PANEL
BUN/Creatinine Ratio: 18 (ref 10–24)
BUN: 22 mg/dL (ref 8–27)
CO2: 29 mmol/L (ref 20–29)
Calcium: 9.1 mg/dL (ref 8.6–10.2)
Chloride: 103 mmol/L (ref 96–106)
Creatinine, Ser: 1.24 mg/dL (ref 0.76–1.27)
GFR calc Af Amer: 66 mL/min/{1.73_m2} (ref >59)
GFR calc non Af Amer: 57 mL/min/{1.73_m2} — ABNORMAL LOW (ref >59)
Glucose: 91 mg/dL (ref 65–99)
Potassium: 4.2 mmol/L (ref 3.5–5.2)
SODIUM: 145 mmol/L — AB (ref 134–144)

## 2018-04-29 NOTE — Progress Notes (Signed)
Cardiology Office Note    Date:  04/30/2018   ID:  Dennis Moon, DOB September 02, 1945, MRN [id number]  PCP:  [name], MD  Cardiologist: Dr. Martinique  Chief Complaint  Patient presents with  . Coronary Artery Disease    History of Present Illness:    Dennis Moon is a 72 y.o. male with past medical history of HTN, HLD, and Stage 3 CKD who is seen for follow up CAD. He is also followed by Rheumatology for gout, subacute cutaneous lupus erythematosus, Sjogren's, osteoarthritis, and DDD.   He was  admitted to [facility] from 7/26 - 12/13/2016 for evaluation of chest pain.   EKG was without acute changes but his troponin values peaked at 0.40. A cardiac catheterization was performed which showed 2-vessel obstructive CAD with 90% Prox-LAD stenosis and 80% Prox Cx stenosis. PCI was performed with placement of DES to both the LAD and LCx. He was started on DAPT with ASA and Brilinta along with BB and statin therapy.   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   On follow up today he is doing very well. 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 He does not exercise much. No chest pain, dyspnea, palpitations, edema. Is no longer on amlodipine. BP in his office typically is 120/ 76.    Past Medical History:  Diagnosis Date  . CAD S/P percutaneous coronary angioplasty 12/12/2016   a. 11/2016: NSTEMI with DES to LAD and DES to LCx  . Gout   . Hypertension   . Hypothyroidism   . Migraine   . Sjogren's disease ([place])   . Systemic lupus erythematosus ([place])     Past Surgical History:  Procedure Laterality Date  . CATARACT EXTRACTION, BILATERAL Bilateral 2017  . CORONARY STENT INTERVENTION N/A 12/12/2016   Procedure: Coronary Stent Intervention;  Surgeon: Martinique, Calbert Hulsebus M, MD;  Location: [facility];  Service: Cardiovascular;  Laterality: N/A;  . HERNIA REPAIR    . LEFT HEART CATH AND CORONARY ANGIOGRAPHY N/A 12/12/2016   Procedure: Left Heart Cath and Coronary  Angiography;  Surgeon: Martinique, Octavia Velador M, MD;  Location: [facility];  Service: Cardiovascular;  Laterality: N/A;  . THYROID SURGERY  1997    Current Medications: Outpatient Medications Prior to Visit  Medication Sig Dispense Refill  . acetaminophen (TYLENOL) 325 MG tablet Take 2 tablets (650 mg total) by mouth every 4 (four) hours as needed for headache or mild pain.    [name] acyclovir ointment (ZOVIRAX) 5 % Apply 1 application topically as needed. 15 g 0  . allopurinol (ZYLOPRIM) 300 MG tablet TAKE 1 AND 1/2 TABLETS BY MOUTH DAILY 135 tablet 0  . Ascorbic Acid (VITAMIN C) 1000 MG tablet Take 1,000 mg by mouth daily.    [name] aspirin EC 81 MG EC tablet Take 1 tablet (81 mg total) by mouth daily.    [name] atorvastatin (LIPITOR) 80 MG tablet Take 1 tablet (80 mg total) by mouth daily at 6 PM. 90 tablet 3  . Calcium Carb-Cholecalciferol (CALCIUM 600 + D PO) Take 1 tablet by mouth daily.    [name] CRANBERRY EXTRACT PO Take 1 tablet by mouth daily.    . DULoxetine (CYMBALTA) 60 MG capsule TAKE 1 CAPSULE BY MOUTH DAILY 90 capsule 1  . furosemide (LASIX) 20 MG tablet TAKE 1 TABLET(20 MG) BY MOUTH DAILY 90 tablet 1  . hydroxychloroquine (PLAQUENIL) 200 MG tablet TAKE 1 TABLET BY MOUTH TWICE DAILY 180 tablet 0  .  levothyroxine (SYNTHROID, LEVOTHROID) 100 MCG tablet TAKE 1 TABLET(100 MCG) BY MOUTH AT BEDTIME 90 tablet 1  . Multiple Vitamins-Minerals (MULTIVITAMIN PO) Take 1 tablet by mouth daily.    . nitroGLYCERIN (NITROSTAT) 0.4 MG SL tablet Place 1 tablet (0.4 mg total) under the tongue every 5 (five) minutes as needed for chest pain. 25 tablet 3  . pantoprazole (PROTONIX) 40 MG tablet Take 1 tablet (40 mg total) by mouth daily. 90 tablet 1  . Probiotic Product (PROBIOTIC PO) Take 1 capsule by mouth daily.    . propranolol (INDERAL) 60 MG tablet Take 1 tablet (60 mg total) by mouth 2 (two) times daily. 60 tablet 12  . testosterone (ANDROGEL) 50 MG/5GM (1%) GEL Place 5 g onto the skin 2 (two) times daily. 300 g  5   No facility-administered medications prior to visit.      Allergies:   Bee venom   Social History   Socioeconomic History  . Marital status: Married    Spouse name: Not on file  . Number of children: Not on file  . Years of education: Not on file  . Highest education level: Not on file  Occupational History  . Not on file  Social Needs  . Financial resource strain: Not on file  . Food insecurity:    Worry: Not on file    Inability: Not on file  . Transportation needs:    Medical: Not on file    Non-medical: Not on file  Tobacco Use  . Smoking status: Former Smoker    Packs/day: 1.00    Years: 6.00    Pack years: 6.00    Last attempt to quit: 04/25/1966    Years since quitting: 52.0  . Smokeless tobacco: Never Used  Substance and Sexual Activity  . Alcohol use: Yes    Alcohol/week: 7.0 standard drinks    Types: 7 Glasses of wine per week  . Drug use: Never  . Sexual activity: Not on file  Lifestyle  . Physical activity:    Days per week: Not on file    Minutes per session: Not on file  . Stress: Not on file  Relationships  . Social connections:    Talks on phone: Not on file    Gets together: Not on file    Attends religious service: Not on file    Active member of club or organization: Not on file    Attends meetings of clubs or organizations: Not on file    Relationship status: Not on file  Other Topics Concern  . Not on file  Social History Narrative  . Not on file     Family History:  The patient's family history includes Diabetes in his mother; Hypertension in his father and mother.   Review of Systems:   Please see the history of present illness.     All other systems reviewed and are otherwise negative except as noted above.   Physical Exam:    VS:  BP (!) 142/76   Pulse 61   Ht 6' (1.829 m)   Wt 236 lb 9.6 oz (107.3 kg)   BMI 32.09 kg/m    GENERAL:  Well appearing, overweight WM in NAD HEENT:  PERRL, EOMI, sclera are clear.  Oropharynx is clear. NECK:  No jugular venous distention, carotid upstroke brisk and symmetric, no bruits, no thyromegaly or adenopathy LUNGS:  Clear to auscultation bilaterally CHEST:  Unremarkable HEART:  RRR,  PMI not displaced or sustained,S1 and S2 within  normal limits, no S3, no S4: no clicks, no rubs, no murmurs ABD:  Soft, nontender. BS +, no masses or bruits. No hepatomegaly, no splenomegaly EXT:  2 + pulses throughout, no edema, no cyanosis no clubbing SKIN:  Warm and dry.  No rashes NEURO:  Alert and oriented x 3. Cranial nerves II through XII intact. PSYCH:  Cognitively intact        Wt Readings from Last 3 Encounters:  04/30/18 236 lb 9.6 oz (107.3 kg)  04/02/18 236 lb 9.6 oz (107.3 kg)  11/17/17 234 lb 2 oz (106.2 kg)      Studies/Labs Reviewed:   EKG:  EKG is  ordered today.  NSR rate 61. Normal. I have personally reviewed and interpreted this study.   Recent Labs: 04/02/2018: Hemoglobin 14.4; Platelets 206 04/28/2018: ALT 31; BUN 22; Creatinine, Ser 1.24; Potassium 4.2; Sodium 145   Lipid Panel    Component Value Date/Time   CHOL 112 04/28/2018 0811   TRIG 80 04/28/2018 0811   HDL 48 04/28/2018 0811   CHOLHDL 2.3 04/28/2018 0811   CHOLHDL 4.2 12/12/2016 0330   VLDL 33 12/12/2016 0330   LDLCALC 48 04/28/2018 0811    Additional studies/ records that were reviewed today include:   Echocardiogram: 12/12/2016 Study Conclusions  - Left ventricle: The cavity size was normal. Wall thickness was   increased in a pattern of mild LVH. Systolic function was normal.   The estimated ejection fraction was in the range of 60% to 65%.   Wall motion was normal; there were no regional wall motion   abnormalities. Doppler parameters are consistent with abnormal   left ventricular relaxation (grade 1 diastolic dysfunction). The   E/e&' ratio is <8, suggesting normal LV filling pressure. - Mitral valve: Mildly thickened leaflets . There was trivial    regurgitation. - Left atrium: The atrium was normal in size. - Tricuspid valve: There was trivial regurgitation. - Pulmonic valve: There was mild regurgitation. - Pulmonary arteries: PA peak pressure: 20 mm Hg (S). - Inferior vena cava: The vessel was normal in size. The   respirophasic diameter changes were in the normal range (>= 50%),   consistent with normal central venous pressure.  Impressions:  - LVEF 60-65%, mild LVH, normal wall motion, grade 1 DD, normal LV   filling pressure, normal LA size, trivial MR, TR, RVSP 20 mmHg,   normal IVC.  Cardiac Catheterization: 12/12/2016  Mid LAD lesion, 40 %stenosed.  The left ventricular systolic function is normal.  LV end diastolic pressure is normal.  The left ventricular ejection fraction is 55-65% by visual estimate.  Prox LAD lesion, 90 %stenosed.  A STENT PROMUS PREM MR 4.0X20 drug eluting stent was successfully placed.  Post intervention, there is a 0% residual stenosis.  Ost Cx to Prox Cx lesion, 80 %stenosed.  A STENT XIENCE ALPINE RX 3.0X18 drug eluting stent was successfully placed.  Post intervention, there is a 0% residual stenosis.   1. 2 vessel obstructive CAD 2. Normal LV function 3. Normal LVEDP 4. Successful stenting of the proximal LAD with DES 5. Successful stenting of the proximal LCx with DES.  Plan: DAPT for one year. Anticipate DC in am.    Assessment:    1. Coronary artery disease involving native coronary artery of native heart without angina pectoris   2. Essential hypertension   3. Hyperlipidemia LDL goal <70   4. CKD (chronic kidney disease) stage 3, GFR 30-59 ml/min (HCC)      Plan:  In order of problems listed above:  1. CAD s/p NSTEMI - s/p NSTEMI in July 2018. S/p  DES to both the LAD and LCx. Echo showed a preserved EF of 60-65% with no regional WMA.  - He is asymptomatic. - off Brilinta now and on ASA monotherapy.  - I will follow up in 6 months.  2. HTN - well on  Inderal and lasix. Reports good readings at work.  3. HLD - well controlled on high dose statin. LDL at goal. 48.  4. Stage 3 CKD - creatinine stable. Followed by Dr. Joelyn Oms.   Signed, Tarrell Debes Martinique, MD  04/30/2018 1:28 PM    [facility] [address], [place], [place] [address] Phone: [phone]

## 2018-04-30 ENCOUNTER — Ambulatory Visit: Payer: Medicare Other | Admitting: Cardiology

## 2018-04-30 ENCOUNTER — Encounter: Payer: Self-pay | Admitting: Cardiology

## 2018-04-30 VITALS — BP 142/76 | HR 61 | Ht 72.0 in | Wt 236.6 lb

## 2018-04-30 DIAGNOSIS — I2510 Atherosclerotic heart disease of native coronary artery without angina pectoris: Secondary | ICD-10-CM

## 2018-04-30 DIAGNOSIS — N183 Chronic kidney disease, stage 3 unspecified: Secondary | ICD-10-CM

## 2018-04-30 DIAGNOSIS — I10 Essential (primary) hypertension: Secondary | ICD-10-CM

## 2018-04-30 DIAGNOSIS — E785 Hyperlipidemia, unspecified: Secondary | ICD-10-CM | POA: Diagnosis not present

## 2018-05-05 ENCOUNTER — Other Ambulatory Visit: Payer: Self-pay | Admitting: Rheumatology

## 2018-05-05 NOTE — Telephone Encounter (Signed)
Last Visit: 04/02/18 Next Visit: 09/03/18 Labs: 04/02/18 Creatinine elevated-1.32 and GFR low-53.  CBC WNL. Uric acid is within desirable range  Okay to refill per Dr. Estanislado Pandy

## 2018-05-15 ENCOUNTER — Other Ambulatory Visit: Payer: Self-pay | Admitting: Rheumatology

## 2018-05-15 ENCOUNTER — Other Ambulatory Visit: Payer: Self-pay | Admitting: Family Medicine

## 2018-05-17 NOTE — Telephone Encounter (Addendum)
Last Visit: 04/02/18 Next Visit: 09/03/18 Labs: 04/02/18 Creatinine elevated-1.32 and GFR low-53.  CBC WNL. Uric acid is within desirable range PLQ eye exam: 05/07/18 WNL  Okay to refill per Dr. Estanislado Pandy

## 2018-05-18 ENCOUNTER — Telehealth: Payer: Self-pay

## 2018-05-18 NOTE — Telephone Encounter (Signed)
Received letter from [place] Follow Up Study requesting last discharge summary,lab,ekgs,cardiac markers.Information faxed to Vinnie Langton M.D. at fax # [phone].

## 2018-05-23 ENCOUNTER — Other Ambulatory Visit: Payer: Self-pay | Admitting: Family Medicine

## 2018-05-23 DIAGNOSIS — E291 Testicular hypofunction: Secondary | ICD-10-CM

## 2018-05-24 NOTE — Telephone Encounter (Signed)
Rx faxed to pharmacy  

## 2018-07-22 ENCOUNTER — Other Ambulatory Visit: Payer: Self-pay | Admitting: Family Medicine

## 2018-07-30 ENCOUNTER — Other Ambulatory Visit: Payer: Self-pay | Admitting: Rheumatology

## 2018-07-30 NOTE — Telephone Encounter (Signed)
Last Visit:04/02/18 Next Visit:09/03/18 Labs:11/15/19Creatinine elevated-1.32 and GFR low-53. CBC WNL. Uric acid is within desirable range PLQ eye exam: 05/07/18 WNL  Okay to refill per Dr. Estanislado Pandy

## 2018-08-04 ENCOUNTER — Telehealth: Payer: Self-pay

## 2018-08-04 NOTE — Telephone Encounter (Signed)
Copied from [place] [id number]. Topic: General - Inquiry >> Aug 04, 2018  9:28 AM Virl Axe D wrote: Reason for CRM: [name] has a clinical question for Dr. Ethelene Hal not related to himself. He would like for Dr. Ethelene Hal to give him a call today at his convenience. Please advise.  Cell:[phone] Office:[phone]

## 2018-08-09 ENCOUNTER — Other Ambulatory Visit: Payer: Self-pay | Admitting: Cardiology

## 2018-08-09 MED ORDER — NITROGLYCERIN 0.4 MG SL SUBL: 0.4000 mg | SUBLINGUAL_TABLET | SUBLINGUAL | 6 refills | Status: DC | PRN

## 2018-08-09 NOTE — Telephone Encounter (Signed)
°*  STAT* If patient is at the pharmacy, call can be transferred to refill team.   1. Which medications need to be refilled? (please list name of each medication and dose if known)  nitroGLYCERIN (NITROSTAT) 0.4 MG SL tablet  2. Which pharmacy/location (including street and city if local pharmacy) is medication to be sent to? [facility] [place], [place]   3. Do they need a 30 day or 90 day supply? 30  Pt lost the small tube he kept his rx in. His main bottle was expired, and so he is unable to fill it. He says he is nervous not having any on hand in cases of emergency

## 2018-08-18 ENCOUNTER — Other Ambulatory Visit: Payer: Self-pay | Admitting: Rheumatology

## 2018-08-18 NOTE — Telephone Encounter (Signed)
Last Visit: 04/02/2018 Next Visit: 09/03/2018 Labs: 04/02/2018 Creatinine elevated-1.32 and GFR low-53.  Uric acid: 04/02/2018 4.4  Okay to refill per Dr. Estanislado Pandy.

## 2018-08-26 NOTE — Progress Notes (Signed)
Virtual Visit via Video Note  I connected with Dennis Moon on 09/03/18 at  8:45 AM EDT by a video enabled telemedicine application and verified that I am speaking with the correct person using two identifiers.   I discussed the limitations of evaluation and management by telemedicine and the availability of in person appointments. The patient expressed understanding and agreed to proceed.  CC: Eye dryness   History of Present Illness: Patient is a 73 year old male with a past medical history of gout, subacute cutaneous lupus, Sjogren's, and osteoarthritis.  He is taking PLQ 200 mg BID.  He is taking allopurinol 450 mg po daily for management of gout. He has not had any recent gout flares.  He has not had any recent rashes.  He continues to have eye dryness and uses eye drops several times per day.  He has no mouth dryness.     Review of Systems  Constitutional: Negative for fever and malaise/fatigue.  HENT:       Denies mouth dryness  Eyes: Negative for photophobia, pain, discharge and redness.       +eye dryness  Respiratory: Negative for cough, shortness of breath and wheezing.   Cardiovascular: Negative for chest pain and palpitations.  Gastrointestinal: Negative for blood in stool, constipation and diarrhea.  Genitourinary: Negative for dysuria.  Musculoskeletal: Negative for back pain, joint pain, myalgias and neck pain.  Skin: Negative for rash.  Neurological: Negative for dizziness and headaches.  Psychiatric/Behavioral: Negative for depression. The patient is not nervous/anxious and does not have insomnia.    Observations/Objective:  Physical Exam  Constitutional: He is oriented to person, place, and time and well-developed, well-nourished, and in no distress.  HENT:  Head: Normocephalic and atraumatic.  Eyes: Conjunctivae are normal.  Pulmonary/Chest: Effort normal.  Neurological: He is alert and oriented to person, place, and time.  Psychiatric: Mood, memory, affect  and judgment normal.     Patient reports morning stiffness for 30 minutes.   Patient denies nocturnal pain.  Difficulty dressing/grooming: Denies Difficulty climbing stairs: Reports Difficulty getting out of chair: Denies Difficulty using hands for taps, buttons, cutlery, and/or writing: Denies  Assessment and Plan: Idiopathic chronic gout of multiple sites without tophus -He has not had any recent gout flares.  He does not need to take colchicine in several years.  He continues to take allopurinol 450 mg by mouth daily.  Uric acid was 4.4 on 04/02/18.  Future orders for CBC, CMP, and uric acid were placed today. He was advised to notify us if he develops signs or symptoms of gout.  Subacute cutaneous lupus erythematosus: He has not had any recent rashes.  He has photosensitivity.  He was advised to wear sunscreen daily and to avoid sun exposure.  He will continue taking PLQ as prescribed.   Sjogren's syndrome with other organ involvement ([place]) Positive ANA, positive Ro, positive La.-He continues to have significant eye dryness and uses eye drops 2-3 times per day. He has no mouth dryness.  He will continue to take plaquenil 200 mg 1 tablet by mouth BID.  High risk medication use -  Plaquenil 200 mg by mouth twice a day. PLQ eye exam normal on 05/07/18 @ [facility].  CBC WNL on 04/02/18.  BMP drawn on 04/28/18.  He is due to update lab work.  Primary osteoarthritis of both hands: He has no joint pain or joint swelling at this time.  He has complete fist formation bilaterally.  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  DDD (degenerative disc disease), lumbar: He has no lower back pain at this time.  Follow Up Instructions: He will follow up in 5 months.   Future orders for CBC, CMP, and uric acid were placed today.    I discussed the assessment and treatment plan with the patient. The patient was provided an opportunity to ask questions and all were answered. The patient  agreed with the plan and demonstrated an understanding of the instructions.   The patient was advised to call back or seek an in-person evaluation if the symptoms worsen or if the condition fails to improve as anticipated.  I provided 25 minutes of non-face-to-face time during this encounter.  Bo Merino, MD   Scribed by- Hazel Sams, PA-C

## 2018-09-03 ENCOUNTER — Encounter: Payer: Self-pay | Admitting: Rheumatology

## 2018-09-03 ENCOUNTER — Ambulatory Visit: Payer: Self-pay | Admitting: Physician Assistant

## 2018-09-03 ENCOUNTER — Telehealth: Payer: Self-pay | Admitting: Rheumatology

## 2018-09-03 ENCOUNTER — Telehealth (INDEPENDENT_AMBULATORY_CARE_PROVIDER_SITE_OTHER): Payer: Medicare Other | Admitting: Rheumatology

## 2018-09-03 DIAGNOSIS — M1A09X0 Idiopathic chronic gout, multiple sites, without tophus (tophi): Secondary | ICD-10-CM

## 2018-09-03 DIAGNOSIS — M5136 Other intervertebral disc degeneration, lumbar region: Secondary | ICD-10-CM

## 2018-09-03 DIAGNOSIS — Z87448 Personal history of other diseases of urinary system: Secondary | ICD-10-CM

## 2018-09-03 DIAGNOSIS — M19041 Primary osteoarthritis, right hand: Secondary | ICD-10-CM

## 2018-09-03 DIAGNOSIS — Z9861 Coronary angioplasty status: Secondary | ICD-10-CM

## 2018-09-03 DIAGNOSIS — M51369 Other intervertebral disc degeneration, lumbar region without mention of lumbar back pain or lower extremity pain: Secondary | ICD-10-CM

## 2018-09-03 DIAGNOSIS — I2510 Atherosclerotic heart disease of native coronary artery without angina pectoris: Secondary | ICD-10-CM

## 2018-09-03 DIAGNOSIS — M3509 Sicca syndrome with other organ involvement: Secondary | ICD-10-CM

## 2018-09-03 DIAGNOSIS — Z79899 Other long term (current) drug therapy: Secondary | ICD-10-CM

## 2018-09-03 DIAGNOSIS — M19042 Primary osteoarthritis, left hand: Secondary | ICD-10-CM

## 2018-09-03 DIAGNOSIS — L931 Subacute cutaneous lupus erythematosus: Secondary | ICD-10-CM

## 2018-09-03 DIAGNOSIS — Z5181 Encounter for therapeutic drug level monitoring: Secondary | ICD-10-CM

## 2018-09-03 NOTE — Telephone Encounter (Signed)
I LMOM for patient to call ,and schedule next 5 month follow up appt with Dr. Estanislado Pandy.

## 2018-09-03 NOTE — Telephone Encounter (Signed)
-----   Message from Carole Binning, LPN sent at 10/06/7469 11:44 AM EDT ----- Please schedule patient for a follow up in 5 months. Patient was seen for a telemedicine visit today 09/03/18. Thanks!

## 2018-09-08 ENCOUNTER — Other Ambulatory Visit: Payer: Self-pay

## 2018-09-08 MED ORDER — PANTOPRAZOLE SODIUM 40 MG PO TBEC: 40.0000 mg | DELAYED_RELEASE_TABLET | Freq: Every day | ORAL | 1 refills | Status: DC

## 2018-09-18 ENCOUNTER — Other Ambulatory Visit: Payer: Self-pay | Admitting: Family Medicine

## 2018-09-20 ENCOUNTER — Other Ambulatory Visit: Payer: Self-pay | Admitting: Family Medicine

## 2018-10-25 ENCOUNTER — Telehealth: Payer: Self-pay | Admitting: Cardiology

## 2018-10-25 ENCOUNTER — Other Ambulatory Visit: Payer: Self-pay | Admitting: Rheumatology

## 2018-10-25 ENCOUNTER — Other Ambulatory Visit: Payer: Self-pay | Admitting: Family Medicine

## 2018-10-26 NOTE — Telephone Encounter (Signed)
Last Visit: 09/03/2018 telemedicine  Next Visit: 02/04/2019 Labs: 04/02/2018 Creatinine elevated-1.32 and GFR low-53. CBC WNL. Uric acid is within desirable range. UA normal.  Eye exam: 05/07/2018  Attempted to contact patient and left message on machine to advise patient he is due to update labs.   Okay to refill 30 day supply, per Dr. Estanislado Pandy.

## 2018-10-26 NOTE — Progress Notes (Signed)
Virtual Visit via Video Note   This visit type was conducted due to national recommendations for restrictions regarding the COVID-19 Pandemic (e.g. social distancing) in an effort to limit this patient's exposure and mitigate transmission in our community.  Due to his co-morbid illnesses, this patient is at least at moderate risk for complications without adequate follow up.  This format is felt to be most appropriate for this patient at this time.  All issues noted in this document were discussed and addressed.  A limited physical exam was performed with this format.  Please refer to the patient's chart for his consent to telehealth for [facility].   Date:  10/29/2018   ID:  Dennis Moon, DOB 11-12-1945, MRN [id number]  Patient Location: Home Provider Location: Home  PCP:  [name], MD  Cardiologist:  Noor Witte Martinique MD Electrophysiologist:  None   Evaluation Performed:  Follow-Up Visit  Chief Complaint:  CAD  History of Present Illness:    Dennis Moon is a 73 y.o. male with  past medical history of HTN, HLD, CAD, and Stage 3 CKD. He is also followed by Rheumatology for gout, subacute cutaneous lupus erythematosus, Sjogren's, osteoarthritis, and DDD.   He was  admitted to [facility] from 7/26 - 12/13/2016 for evaluation of chest pain.   EKG was without acute changes but his troponin values peaked at 0.40. A cardiac catheterization was performed which showed 2-vessel obstructive CAD with 90% Prox-LAD stenosis and 80% Prox Cx stenosis. PCI was performed with placement of DES to both the LAD and LCx. He was started on DAPT with ASA and Brilinta along with BB and statin therapy.   He reports he is doing well. Not able to get as much exercise. Back limits his walking and pool is now closed. Denies any chest pain, dyspnea, palpitations, dizziness, or edema.  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    The patient does not have symptoms concerning for  COVID-19 infection (fever, chills, cough, or new shortness of breath).    Past Medical History:  Diagnosis Date  . CAD S/P percutaneous coronary angioplasty 12/12/2016   a. 11/2016: NSTEMI with DES to LAD and DES to LCx  . Gout   . Hypertension   . Hypothyroidism   . Migraine   . Sjogren's disease ([place])   . Systemic lupus erythematosus ([place])    Past Surgical History:  Procedure Laterality Date  . CATARACT EXTRACTION, BILATERAL Bilateral 2017  . CORONARY STENT INTERVENTION N/A 12/12/2016   Procedure: Coronary Stent Intervention;  Surgeon: Martinique, Stark Aguinaga M, MD;  Location: [place] [facility];  Service: Cardiovascular;  Laterality: N/A;  . HERNIA REPAIR    . LEFT HEART CATH AND CORONARY ANGIOGRAPHY N/A 12/12/2016   Procedure: Left Heart Cath and Coronary Angiography;  Surgeon: Martinique, Mohamed Portlock M, MD;  Location: [place] [facility];  Service: Cardiovascular;  Laterality: N/A;  . THYROID SURGERY  1997     Current Meds  Medication Sig  . acetaminophen (TYLENOL) 325 MG tablet Take 2 tablets (650 mg total) by mouth every 4 (four) hours as needed for headache or mild pain.  [name] acyclovir ointment (ZOVIRAX) 5 % Apply 1 application topically as needed.  [name] allopurinol (ZYLOPRIM) 300 MG tablet TAKE 1 AND 1/2 TABLET BY MOUTH EVERY DAY  . Ascorbic Acid (VITAMIN C) 1000 MG tablet Take 1,000 mg by mouth daily.  [name] aspirin EC 81 MG EC tablet Take 1 tablet (81 mg total) by  mouth daily.  [name] atorvastatin (LIPITOR) 80 MG tablet Take 1 tablet (80 mg total) by mouth daily at 6 PM.  . Calcium Carb-Cholecalciferol (CALCIUM 600 + D PO) Take 1 tablet by mouth daily.  [name] CRANBERRY EXTRACT PO Take 1 tablet by mouth daily.  . DULoxetine (CYMBALTA) 60 MG capsule TAKE 1 CAPSULE BY MOUTH DAILY  . esomeprazole (NEXIUM 24HR) 20 MG capsule Takes 2 capsules ( 40 mg ) every night  . furosemide (LASIX) 20 MG tablet TAKE 1 TABLET(20 MG) BY MOUTH DAILY  . hydroxychloroquine (PLAQUENIL) 200 MG tablet TAKE 1 TABLET BY MOUTH TWICE  DAILY  . levothyroxine (SYNTHROID) 100 MCG tablet TAKE 1 TABLET(100 MCG) BY MOUTH AT BEDTIME  . Multiple Vitamins-Minerals (MULTIVITAMIN PO) Take 1 tablet by mouth daily.  . nitroGLYCERIN (NITROSTAT) 0.4 MG SL tablet Place 1 tablet (0.4 mg total) under the tongue every 5 (five) minutes as needed for chest pain.  . Probiotic Product (PROBIOTIC PO) Take 1 capsule by mouth daily.  . propranolol (INDERAL) 60 MG tablet TAKE 1 TABLET(60 MG) BY MOUTH TWICE DAILY  . testosterone (ANDROGEL) 50 MG/5GM (1%) GEL APPLY 5G(1 PACKET) ONTO THE SKIN TWICE DAILY  . [DISCONTINUED] esomeprazole (NEXIUM) 20 MG capsule Take 20 mg by mouth 2 (two) times daily before a meal. Takes 2 capsules at night     Allergies:   Bee venom   Social History   Tobacco Use  . Smoking status: Former Smoker    Packs/day: 1.00    Years: 6.00    Pack years: 6.00    Quit date: 04/25/1966    Years since quitting: 52.5  . Smokeless tobacco: Never Used  Substance Use Topics  . Alcohol use: Yes    Alcohol/week: 7.0 standard drinks    Types: 7 Glasses of wine per week  . Drug use: Never     Family Hx: The patient's family history includes Diabetes in his mother; Healthy in his son; Hypertension in his father and mother.  ROS:   Please see the history of present illness.    All other systems reviewed and are negative.   Prior CV studies:   The following studies were reviewed today:  Echocardiogram: 12/12/2016 Study Conclusions  - Left ventricle: The cavity size was normal. Wall thickness was increased in a pattern of mild LVH. Systolic function was normal. The estimated ejection fraction was in the range of 60% to 65%. Wall motion was normal; there were no regional wall motion abnormalities. Doppler parameters are consistent with abnormal left ventricular relaxation (grade 1 diastolic dysfunction). The E/e&' ratio is <8, suggesting normal LV filling pressure. - Mitral valve: Mildly thickened leaflets .  There was trivial regurgitation. - Left atrium: The atrium was normal in size. - Tricuspid valve: There was trivial regurgitation. - Pulmonic valve: There was mild regurgitation. - Pulmonary arteries: PA peak pressure: 20 mm Hg (S). - Inferior vena cava: The vessel was normal in size. The respirophasic diameter changes were in the normal range (>= 50%), consistent with normal central venous pressure.  Impressions:  - LVEF 60-65%, mild LVH, normal wall motion, grade 1 DD, normal LV filling pressure, normal LA size, trivial MR, TR, RVSP 20 mmHg, normal IVC.  Cardiac Catheterization: 12/12/2016  Mid LAD lesion, 40 %stenosed.  The left ventricular systolic function is normal.  LV end diastolic pressure is normal.  The left ventricular ejection fraction is 55-65% by visual estimate.  Prox LAD lesion, 90 %stenosed.  A STENT PROMUS PREM MR 4.0X20 drug  eluting stent was successfully placed.  Post intervention, there is a 0% residual stenosis.  Ost Cx to Prox Cx lesion, 80 %stenosed.  A STENT XIENCE ALPINE RX 3.0X18 drug eluting stent was successfully placed.  Post intervention, there is a 0% residual stenosis.  1. 2 vessel obstructive CAD 2. Normal LV function 3. Normal LVEDP 4. Successful stenting of the proximal LAD with DES 5. Successful stenting of the proximal LCx with DES.  Plan: DAPT for one year. Anticipate DC in am.   Labs/Other Tests and Data Reviewed:    EKG:  No ECG reviewed.  Recent Labs: 04/02/2018: Hemoglobin 14.4; Platelets 206 04/28/2018: ALT 31; BUN 22; Creatinine, Ser 1.24; Potassium 4.2; Sodium 145   Recent Lipid Panel Lab Results  Component Value Date/Time   CHOL 112 04/28/2018 08:11 AM   TRIG 80 04/28/2018 08:11 AM   HDL 48 04/28/2018 08:11 AM   CHOLHDL 2.3 04/28/2018 08:11 AM   CHOLHDL 4.2 12/12/2016 03:30 AM   LDLCALC 48 04/28/2018 08:11 AM    Wt Readings from Last 3 Encounters:  10/29/18 240 lb (108.9 kg)  04/30/18 236  lb 9.6 oz (107.3 kg)  04/02/18 236 lb 9.6 oz (107.3 kg)     Objective:    Vital Signs:  BP 128/78   Pulse 88   Ht 6' (1.829 m)   Wt 240 lb (108.9 kg)   BMI 32.55 kg/m    VITAL SIGNS:  reviewed  General no distress HEENT. Wears glasses. Sclera are clear Respirations unlabored. Skin clear Neuro alert and oriented x 3. nonfocal Mood normal  ASSESSMENT & PLAN:    1. CAD s/p NSTEMI - s/p NSTEMI in July 2018. S/p  DES to both the LAD and LCx. Echo showed a preserved EF of 60-65% with no regional WMA.  - He is asymptomatic. - continue ASA, statin, inderal - I will follow up in 6 months.  2. HTN - well controlled on Inderal and lasix.   3. HLD - well controlled on high dose statin. LDL at goal. 48.  4. Stage 3 CKD - creatinine stable. Followed by Dr. Joelyn Oms.   COVID-19 Education: The signs and symptoms of COVID-19 were discussed with the patient and how to seek care for testing (follow up with PCP or arrange E-visit).  The importance of social distancing was discussed today.  Time:   Today, I have spent 10 minutes with the patient with telehealth technology discussing the above problems.     Medication Adjustments/Labs and Tests Ordered: Current medicines are reviewed at length with the patient today.  Concerns regarding medicines are outlined above.   Tests Ordered: No orders of the defined types were placed in this encounter.   Medication Changes: No orders of the defined types were placed in this encounter.   Disposition:  Follow up in 6 month(s)with lab  Signed, Sulamita Lafountain Martinique, MD  10/29/2018 8:11 AM    [facility]

## 2018-10-27 NOTE — Telephone Encounter (Signed)
smartphone-[phone]/consent/ my chart/ pre reg completed

## 2018-10-29 ENCOUNTER — Telehealth (INDEPENDENT_AMBULATORY_CARE_PROVIDER_SITE_OTHER): Payer: Medicare Other | Admitting: Cardiology

## 2018-10-29 ENCOUNTER — Encounter: Payer: Self-pay | Admitting: Cardiology

## 2018-10-29 VITALS — BP 128/78 | HR 88 | Ht 72.0 in | Wt 240.0 lb

## 2018-10-29 DIAGNOSIS — I10 Essential (primary) hypertension: Secondary | ICD-10-CM

## 2018-10-29 DIAGNOSIS — I2510 Atherosclerotic heart disease of native coronary artery without angina pectoris: Secondary | ICD-10-CM

## 2018-10-29 DIAGNOSIS — E785 Hyperlipidemia, unspecified: Secondary | ICD-10-CM

## 2018-10-29 NOTE — Patient Instructions (Signed)
Medication Instructions:  Continue same medications If you need a refill on your cardiac medications before your next appointment, please call your pharmacy.   Lab work: Fasting lab in 6 months 3 to 5 days before appointment No appointment needed Lab in our office opens 8:00 am to 12:00 noon Nothing to eat after midnight you may have water.   Testing/Procedures: None ordered  Follow-Up: At [facility], you and your health needs are our priority.  As part of our continuing mission to provide you with exceptional heart care, we have created designated Provider Care Teams.  These Care Teams include your primary Cardiologist (physician) and Advanced Practice Providers (APPs -  Physician Assistants and Nurse Practitioners) who all work together to provide you with the care you need, when you need it. [name]

## 2018-10-29 NOTE — Addendum Note (Signed)
Addended by: Kathyrn Lass on: 10/29/2018 08:35 AM   Modules accepted: Orders

## 2018-11-09 ENCOUNTER — Encounter: Payer: Self-pay | Admitting: Family Medicine

## 2018-11-10 ENCOUNTER — Telehealth: Payer: Self-pay

## 2018-11-10 ENCOUNTER — Other Ambulatory Visit: Payer: Self-pay | Admitting: Family Medicine

## 2018-11-10 ENCOUNTER — Other Ambulatory Visit: Payer: Self-pay | Admitting: Rheumatology

## 2018-11-10 NOTE — Telephone Encounter (Signed)

## 2018-11-11 ENCOUNTER — Other Ambulatory Visit: Payer: Self-pay

## 2018-11-11 ENCOUNTER — Encounter: Payer: Self-pay | Admitting: Family Medicine

## 2018-11-11 ENCOUNTER — Other Ambulatory Visit: Payer: Self-pay | Admitting: Rheumatology

## 2018-11-11 ENCOUNTER — Ambulatory Visit (INDEPENDENT_AMBULATORY_CARE_PROVIDER_SITE_OTHER): Payer: Medicare Other | Admitting: Family Medicine

## 2018-11-11 VITALS — BP 130/80 | HR 102 | Ht 72.0 in

## 2018-11-11 DIAGNOSIS — B351 Tinea unguium: Secondary | ICD-10-CM | POA: Diagnosis not present

## 2018-11-11 DIAGNOSIS — L03115 Cellulitis of right lower limb: Secondary | ICD-10-CM | POA: Diagnosis not present

## 2018-11-11 MED ORDER — CEPHALEXIN 500 MG PO CAPS: 500.0000 mg | ORAL_CAPSULE | Freq: Two times a day (BID) | ORAL | 0 refills | Status: AC

## 2018-11-11 MED ORDER — ATORVASTATIN CALCIUM 80 MG PO TABS: 80.0000 mg | ORAL_TABLET | Freq: Every day | ORAL | 3 refills | Status: DC

## 2018-11-11 NOTE — Telephone Encounter (Signed)
Ok to provide 30 day supply

## 2018-11-11 NOTE — Telephone Encounter (Addendum)
Last Visit: 09/03/2018 telemedicine  Next Visit: 02/04/2019 Labs: 04/02/2018 Creatinine elevated-1.32 and GFR low-53. CBC WNL. Uric acid is within desirable range. UA normal.   Attempted to contact the patient and unable to leave a message, mailbox is full.   Okay to refill 30 day supply Allopurinol?

## 2018-11-11 NOTE — Telephone Encounter (Signed)
Refills

## 2018-11-11 NOTE — Progress Notes (Signed)
Established Patient Office Visit  Subjective:  Patient ID: Dennis Moon, male    DOB: 08/03/1945  Age: 73 y.o. MRN: [id number]  CC:  Chief Complaint  Patient presents with  . toenail turning dark    HPI Dennis Moon presents for evaluation of right great toenail.  Patient stopped his toe and developed some bleeding underneath the nail with accompanying erythema.  Toenail is now loose.  He has had no streaking fever or chills or purulent discharge.  Nail has been thick and oncotic looking for years.  Past Medical History:  Diagnosis Date  . CAD S/P percutaneous coronary angioplasty 12/12/2016   a. 11/2016: NSTEMI with DES to LAD and DES to LCx  . Gout   . Hypertension   . Hypothyroidism   . Migraine   . Sjogren's disease ([place])   . Systemic lupus erythematosus ([place])     Past Surgical History:  Procedure Laterality Date  . CATARACT EXTRACTION, BILATERAL Bilateral 2017  . CORONARY STENT INTERVENTION N/A 12/12/2016   Procedure: Coronary Stent Intervention;  Surgeon: Martinique, Peter M, MD;  Location: [place] [facility];  Service: Cardiovascular;  Laterality: N/A;  . HERNIA REPAIR    . LEFT HEART CATH AND CORONARY ANGIOGRAPHY N/A 12/12/2016   Procedure: Left Heart Cath and Coronary Angiography;  Surgeon: Martinique, Peter M, MD;  Location: [place] [facility];  Service: Cardiovascular;  Laterality: N/A;  . THYROID SURGERY  1997    Family History  Problem Relation Age of Onset  . Diabetes Mother   . Hypertension Mother   . Hypertension Father   . Healthy Son     Social History   Socioeconomic History  . Marital status: Married    Spouse name: Not on file  . Number of children: Not on file  . Years of education: Not on file  . Highest education level: Not on file  Occupational History  . Not on file  Social Needs  . Financial resource strain: Not on file  . Food insecurity    Worry: Not on file    Inability: Not on file  . Transportation needs    Medical: Not on  file    Non-medical: Not on file  Tobacco Use  . Smoking status: Former Smoker    Packs/day: 1.00    Years: 6.00    Pack years: 6.00    Quit date: 04/25/1966    Years since quitting: 52.5  . Smokeless tobacco: Never Used  Substance and Sexual Activity  . Alcohol use: Yes    Alcohol/week: 7.0 standard drinks    Types: 7 Glasses of wine per week  . Drug use: Never  . Sexual activity: Not on file  Lifestyle  . Physical activity    Days per week: Not on file    Minutes per session: Not on file  . Stress: Not on file  Relationships  . Social Herbalist on phone: Not on file    Gets together: Not on file    Attends religious service: Not on file    Active member of club or organization: Not on file    Attends meetings of clubs or organizations: Not on file    Relationship status: Not on file  . Intimate partner violence    Fear of current or ex partner: Not on file    Emotionally abused: Not on file    Physically abused: Not on file    Forced sexual activity: Not on  file  Other Topics Concern  . Not on file  Social History Narrative  . Not on file    Outpatient Medications Prior to Visit  Medication Sig Dispense Refill  . acetaminophen (TYLENOL) 325 MG tablet Take 2 tablets (650 mg total) by mouth every 4 (four) hours as needed for headache or mild pain.    [name] acyclovir ointment (ZOVIRAX) 5 % Apply 1 application topically as needed. 15 g 0  . Ascorbic Acid (VITAMIN C) 1000 MG tablet Take 1,000 mg by mouth daily.    [name] aspirin EC 81 MG EC tablet Take 1 tablet (81 mg total) by mouth daily.    [name] atorvastatin (LIPITOR) 80 MG tablet Take 1 tablet (80 mg total) by mouth daily at 6 PM. 90 tablet 3  . Calcium Carb-Cholecalciferol (CALCIUM 600 + D PO) Take 1 tablet by mouth daily.    [name] CRANBERRY EXTRACT PO Take 1 tablet by mouth daily.    . DULoxetine (CYMBALTA) 60 MG capsule TAKE 1 CAPSULE BY MOUTH DAILY 90 capsule 1  . esomeprazole (NEXIUM 24HR) 20 MG capsule Takes 2  capsules ( 40 mg ) every night 60 capsule 6  . furosemide (LASIX) 20 MG tablet TAKE 1 TABLET(20 MG) BY MOUTH DAILY 90 tablet 1  . hydroxychloroquine (PLAQUENIL) 200 MG tablet TAKE 1 TABLET BY MOUTH TWICE DAILY 60 tablet 0  . levothyroxine (SYNTHROID) 100 MCG tablet TAKE 1 TABLET(100 MCG) BY MOUTH AT BEDTIME 90 tablet 0  . Multiple Vitamins-Minerals (MULTIVITAMIN PO) Take 1 tablet by mouth daily.    . nitroGLYCERIN (NITROSTAT) 0.4 MG SL tablet Place 1 tablet (0.4 mg total) under the tongue every 5 (five) minutes as needed for chest pain. 25 tablet 6  . Probiotic Product (PROBIOTIC PO) Take 1 capsule by mouth daily.    . propranolol (INDERAL) 60 MG tablet TAKE 1 TABLET(60 MG) BY MOUTH TWICE DAILY 180 tablet 0  . testosterone (ANDROGEL) 50 MG/5GM (1%) GEL APPLY 5G(1 PACKET) ONTO THE SKIN TWICE DAILY 300 g 5  . allopurinol (ZYLOPRIM) 300 MG tablet TAKE 1 AND 1/2 TABLET BY MOUTH EVERY DAY 135 tablet 0   No facility-administered medications prior to visit.     Allergies  Allergen Reactions  . Bee Venom Swelling    ROS Review of Systems  Constitutional: Negative.   Respiratory: Negative.   Cardiovascular: Negative.   Gastrointestinal: Negative.   Skin: Positive for color change. Negative for rash and wound.  Hematological: Does not bruise/bleed easily.  Psychiatric/Behavioral: Negative.       Objective:    Physical Exam  Constitutional: He is oriented to person, place, and time. He appears well-developed and well-nourished. No distress.  HENT:  Head: Normocephalic and atraumatic.  Right Ear: External ear normal.  Left Ear: External ear normal.  Pulmonary/Chest: Effort normal.  Neurological: He is alert and oriented to person, place, and time.  Skin: Skin is warm and dry. He is not diaphoretic.     Psychiatric: He has a normal mood and affect. His behavior is normal.    BP 130/80   Pulse (!) 102   Ht 6' (1.829 m)   SpO2 95%   BMI 32.55 kg/m  Wt Readings from Last 3  Encounters:  10/29/18 240 lb (108.9 kg)  04/30/18 236 lb 9.6 oz (107.3 kg)  04/02/18 236 lb 9.6 oz (107.3 kg)   BP Readings from Last 3 Encounters:  11/11/18 130/80  10/29/18 128/78  04/30/18 (!) 142/76   Guideline developer:  UpToDate (  see UpToDate for funding source) Date Released: June 2014  Health Maintenance Due  Topic Date Due  . Samul Dada  07/29/2017    There are no preventive care reminders to display for this patient.  Lab Results  Component Value Date   TSH 6.351 (H) 12/12/2016   Lab Results  Component Value Date   WBC 6.3 04/02/2018   HGB 14.4 04/02/2018   HCT 43.0 04/02/2018   MCV 93.5 04/02/2018   PLT 206 04/02/2018   Lab Results  Component Value Date   NA 145 (H) 04/28/2018   K 4.2 04/28/2018   CO2 29 04/28/2018   GLUCOSE 91 04/28/2018   BUN 22 04/28/2018   CREATININE 1.24 04/28/2018   BILITOT 0.3 04/28/2018   ALKPHOS 74 04/28/2018   AST 33 04/28/2018   ALT 31 04/28/2018   PROT 6.8 04/28/2018   ALBUMIN 4.2 04/28/2018   CALCIUM 9.1 04/28/2018   ANIONGAP 9 12/13/2016   GFR 59.11 (L) 04/22/2017   Lab Results  Component Value Date   CHOL 112 04/28/2018   Lab Results  Component Value Date   HDL 48 04/28/2018   Lab Results  Component Value Date   LDLCALC 48 04/28/2018   Lab Results  Component Value Date   TRIG 80 04/28/2018   Lab Results  Component Value Date   CHOLHDL 2.3 04/28/2018   No results found for: HGBA1C    Assessment & Plan:   Problem List Items Addressed This Visit    None    Visit Diagnoses    Onychomycosis    -  Primary   Relevant Medications   cephALEXin (KEFLEX) 500 MG capsule   Cellulitis of right lower extremity       Relevant Medications   cephALEXin (KEFLEX) 500 MG capsule      Meds ordered this encounter  Medications  . cephALEXin (KEFLEX) 500 MG capsule    Sig: Take 1 capsule (500 mg total) by mouth 2 (two) times daily for 10 days.    Dispense:  20 capsule    Refill:  0    Follow-up:  Return in about 2 weeks (around 11/25/2018), or if symptoms worsen or fail to improve.

## 2018-12-08 ENCOUNTER — Other Ambulatory Visit: Payer: Self-pay | Admitting: Rheumatology

## 2018-12-08 NOTE — Telephone Encounter (Addendum)
Last Visit:09/03/2018 telemedicine Next Visit:02/04/2019 Labs:11/15/2019Creatinine elevated-1.32 and GFR low-53.CBC WNL. Uric acid is within desirable range. UA normal.  Left message for patient to advise he is due for labs.  Okay to refill 30 day supply Allopurinol?

## 2018-12-08 NOTE — Telephone Encounter (Signed)
Ok to refill 30-day supply

## 2018-12-10 ENCOUNTER — Telehealth: Payer: Self-pay | Admitting: *Deleted

## 2018-12-10 NOTE — Telephone Encounter (Signed)
Spoke with patient's wife and advised we received letter from [employer] and the recommendations are for Treyten to no longer be on the PLQ. She states the patient has already taken himself off. Advised patient's wife that we need to schedule an appointment to discuss treatment options. She states she will talk with the patient and call back to schedule.

## 2018-12-14 ENCOUNTER — Other Ambulatory Visit: Payer: Self-pay | Admitting: Family Medicine

## 2018-12-14 DIAGNOSIS — E291 Testicular hypofunction: Secondary | ICD-10-CM

## 2018-12-15 NOTE — Telephone Encounter (Signed)
Needs ov in 2 mos.

## 2019-01-14 ENCOUNTER — Other Ambulatory Visit: Payer: Self-pay | Admitting: Rheumatology

## 2019-01-14 ENCOUNTER — Other Ambulatory Visit: Payer: Self-pay | Admitting: Family Medicine

## 2019-01-21 NOTE — Progress Notes (Signed)
Office Visit Note  Patient: Dennis Moon             Date of Birth: 1946/01/20           MRN: [id number]             PCP: [name], MD Referring: [name],* Visit Date: 02/04/2019 Occupation: @GUAROCC @  Subjective:  Discuss medication options   History of Present Illness: Dennis Moon is a 73 y.o. male with a history of subcutaneous lupus, Sjogren's syndrome, and gout.  Patient reports that he discontinued Plaquenil 2 months ago after an abnormal Plaquenil eye exam.  According to Dr. Lissa Morales note central vision was depressed.  Dr. Arsenio Loader did not recommend continuing on Plaquenil.  He reports he is having a repeat examination today.  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  He denies any recent flares of subcutaneous lupus.  He has not had any recent rashes.  He is having some increased arthralgias and joint stiffness since discontinuing Plaquenil denies any joint swelling.  He takes Excedrin as needed for pain relief.  He continues to have chronic sicca symptoms but has not noticed any worsening symptoms since discontinuing Plaquenil.  He uses eyedrops 3 times daily.  He has tried Restasis and Xiidra in the past. He denies any recent gout flares. He reduced allopurinol from 450 mg daily to 300 mg daily and has not had any flares.     Activities of Daily Living:  Patient reports morning stiffness for several hours.   Patient Denies nocturnal pain.  Difficulty dressing/grooming: Denies Difficulty climbing stairs: Denies Difficulty getting out of chair: Denies Difficulty using hands for taps, buttons, cutlery, and/or writing: Denies  Review of Systems  Constitutional: Positive for fatigue. Negative for night sweats.  HENT: Positive for mouth dryness and nose dryness. Negative for mouth sores, trouble swallowing and trouble swallowing.   Eyes: Positive for dryness. Negative for redness and itching.   Respiratory: Negative for cough, hemoptysis, shortness of breath, wheezing and difficulty breathing.   Cardiovascular: Negative for chest pain, palpitations, hypertension, irregular heartbeat and swelling in legs/feet.  Gastrointestinal: Negative for blood in stool, constipation and diarrhea.  Endocrine: Negative for increased urination.  Genitourinary: Negative for difficulty urinating and painful urination.  Musculoskeletal: Positive for arthralgias, joint pain and morning stiffness. Negative for joint swelling, myalgias, muscle weakness, muscle tenderness and myalgias.  Skin: Negative for color change, rash, hair loss, nodules/bumps, skin tightness, ulcers and sensitivity to sunlight.  Allergic/Immunologic: Negative for susceptible to infections.  Neurological: Negative for dizziness, fainting, memory loss, night sweats and weakness.  Hematological: Negative for bruising/bleeding tendency and swollen glands.  Psychiatric/Behavioral: Negative for depressed mood, confusion and sleep disturbance. The patient is not nervous/anxious.     PMFS History:  Patient Active Problem List   Diagnosis Date Noted  . Androgen deficiency 11/17/2017  . Acquired hypothyroidism 11/17/2017  . Cyst of tonsil 06/11/2017  . Obesity (BMI 30.0-34.9) 03/12/2017  . CAD S/P percutaneous coronary angioplasty 12/13/2016  . NSTEMI (non-ST elevated myocardial infarction) ([place]) 12/11/2016  . Gout of multiple sites 08/26/2016  . Chronic renal impairment 08/26/2016  . Chronic kidney disease 08/26/2016  . Sjogren's syndrome ([place]) 04/23/2016  . 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 Idiopathic chronic gout, unspecified site, without tophus (tophi) 04/23/2016  . High risk medication use 04/23/2016  . Primary osteoarthritis of both hands 04/23/2016  . DDD (degenerative disc disease), lumbar 04/23/2016  .  Kidney disease 04/23/2016    Past Medical History:  Diagnosis Date  . CAD S/P percutaneous coronary angioplasty 12/12/2016    a. 11/2016: NSTEMI with DES to LAD and DES to LCx  . Gout   . Hypertension   . Hypothyroidism   . Migraine   . Sjogren's disease ([place])   . Systemic lupus erythematosus (HCC)     Family History  Problem Relation Age of Onset  . Diabetes Mother   . Hypertension Mother   . Hypertension Father   . Healthy Son    Past Surgical History:  Procedure Laterality Date  . CATARACT EXTRACTION, BILATERAL Bilateral 2017  . CORONARY STENT INTERVENTION N/A 12/12/2016   Procedure: Coronary Stent Intervention;  Surgeon: Martinique, Peter M, MD;  Location: [facility];  Service: Cardiovascular;  Laterality: N/A;  . HERNIA REPAIR    . LEFT HEART CATH AND CORONARY ANGIOGRAPHY N/A 12/12/2016   Procedure: Left Heart Cath and Coronary Angiography;  Surgeon: Martinique, Peter M, MD;  Location: [facility];  Service: Cardiovascular;  Laterality: N/A;  . THYROID SURGERY  1997   Social History   Social History Narrative  . Not on file   Immunization History  Administered Date(s) Administered  . Influenza-Unspecified 01/17/2017  . Pneumococcal Conjugate-13 10/21/2013  . Pneumococcal Polysaccharide-23 08/02/2008, 03/14/2016  . Tdap 07/30/2007  . Zoster 08/28/2006  . Zoster Recombinat (Shingrix) 11/12/2017     Objective: Vital Signs: BP 133/75 (BP Location: Right Arm, Patient Position: Sitting, Cuff Size: Normal)   Pulse (!) 51   Resp 14   Ht 6' (1.829 m)   Wt 240 lb (108.9 kg)   BMI 32.55 kg/m    Physical Exam Vitals signs and nursing note reviewed.  Constitutional:      Appearance: He is well-developed.  HENT:     Head: Normocephalic and atraumatic.  Eyes:     Conjunctiva/sclera: Conjunctivae normal.     Pupils: Pupils are equal, round, and reactive to light.  Neck:     Musculoskeletal: Normal range of motion and neck supple.  Cardiovascular:     Rate and Rhythm: Normal rate and regular rhythm.     Heart sounds: Normal heart sounds.  Pulmonary:     Effort: Pulmonary effort is  normal.     Breath sounds: Normal breath sounds.  Abdominal:     General: Bowel sounds are normal.     Palpations: Abdomen is soft.  Skin:    General: Skin is warm and dry.     Capillary Refill: Capillary refill takes less than 2 seconds.  Neurological:     Mental Status: He is alert and oriented to person, place, and time.  Psychiatric:        Behavior: Behavior normal.      Musculoskeletal Exam: C-spine limited ROM with lateral rotation. Thoracic kyphosis noted.  Shoulder joints, elbow joints, wrist joints, MCPs, PIPs, and DIPs good ROM with no synovitis.  PIP and DIP synovial thickening consistent with osteoarthritis of both hands.  Complete fist formation bilaterally.  Hip joints, knee joints, ankle joints, MTPs, PIPs, and DIPs good ROM with no synovitis.  No warmth or effusion of knee joints.  No tenderness or swelling of ankle joints.    CDAI Exam: CDAI Score: - Patient Global: -; Provider Global: - Swollen: -; Tender: - Joint Exam   No joint exam has been documented for this visit   There is currently no information documented on the homunculus. Go to the Rheumatology activity  and complete the homunculus joint exam.  Investigation: No additional findings.  Imaging: No results found.  Recent Labs: Lab Results  Component Value Date   WBC 6.3 04/02/2018   HGB 14.4 04/02/2018   PLT 206 04/02/2018   NA 145 (H) 04/28/2018   K 4.2 04/28/2018   CL 103 04/28/2018   CO2 29 04/28/2018   GLUCOSE 91 04/28/2018   BUN 22 04/28/2018   CREATININE 1.24 04/28/2018   BILITOT 0.3 04/28/2018   ALKPHOS 74 04/28/2018   AST 33 04/28/2018   ALT 31 04/28/2018   PROT 6.8 04/28/2018   ALBUMIN 4.2 04/28/2018   CALCIUM 9.1 04/28/2018   GFRAA 66 04/28/2018    Speciality Comments: PLQ eye exam normal on 05/07/18  @ [facility].  Procedures:  No procedures performed Allergies: Bee venom    Assessment / Plan:     Visit Diagnoses: Subacute cutaneous lupus erythematosus: He  has not had any recent flares.  No skin lesions or rashes were noted on exam.  He does have persistent photosensitivity.  We discussed the importance of avoiding sun exposure and wearing SPF greater than 50 on a daily basis.  He discontinued taking Plaquenil 2 months ago due to an abnormal eye exam.  According to Dr. Lissa Morales note central vision was depressed, and he did not recommend the patient continuing on PLQ.  He has not had any new or worsening symptoms since discontinuing Plaquenil.  We discussed adding him on methotrexate but he does not want to proceed at this time.  He was given a prescription for clobetasol ointment which he will apply topically as needed.  He was advised to notify us if he develops any new or worsening symptoms.  He will follow-up in 3 months.  High risk medication use - d/c PLQ due to abnormal eye exam 11/2018.  Since her vision was depressed on exam.  Dr. Arsenio Loader did not recommend continuing on Plaquenil.  He is planning for repeat exam today.  We will not restart him on Plaquenil.  He does not want to start the methotrexate at this time.  He will use topical clobetasol ointment as needed and we stressed the importance of wearing sunscreen greater than SPF 50 on a daily basis.  Idiopathic chronic gout of multiple sites without tophus - He has not had any recent gout flares.  He takes allopurinol 300 mg 1 tablet by mouth daily. He reduced Allopurinol from 450 mg to 300 mg and has not noticed any signs or symptoms of a flare.  Uric acid was 4.4 on 04/02/18.  We will check uric acid, CBC, and CMP today.  He does not need a refill of allopurinol at this time.  He was advised to notify us if he develops signs or symptom of a flare.   - Plan: CBC with Differential/Platelet, COMPLETE METABOLIC PANEL WITH GFR, Uric acid  Medication monitoring encounter -CBC and CMP will be drawn today. Plan: CBC with Differential/Platelet, COMPLETE METABOLIC PANEL WITH GFR, Uric acid  Sjogren's  syndrome with other organ involvement (HCC) - Positive ANA, positive Ro, positive La: He has chronic sicca symptoms.    Primary osteoarthritis of both hands: He has PIP and DIP synovial thickening consistent with osteoarthritis of both hands.  No tenderness or synovitis on exam.  He has complete fist formation bilaterally.    DDD (degenerative disc disease), lumbar: He has no lower back pain at this time.   Other medical conditions are listed as follows:  CAD S/P percutaneous coronary angioplasty  History of chronic kidney disease   Orders: Orders Placed This Encounter  Procedures  . CBC with Differential/Platelet  . COMPLETE METABOLIC PANEL WITH GFR  . Uric acid   Meds ordered this encounter  Medications  . clobetasol ointment (TEMOVATE) 0.05 %    Sig: Apply 1 application topically 2 (two) times daily.    Dispense:  60 g    Refill:  0     Follow-Up Instructions: Return in about 3 months (around 05/06/2019) for Subcutaneous lupus, gout, Sjogren's syndrome.   Ofilia Neas, PA-C   I examined and evaluated the patient with Hazel Sams PA.  Patient states that he had some central vision issues and he went to see his optometrist.  He is having a follow-up visit today.  He is planning to see a retina specialist.  He has been off Plaquenil for last couple of months.  We had detailed discussion regarding different treatment options.  I am hesitant to put him on methotrexate because of elevated creatinine in the past.  He is also not very symptomatic.  We decided to give him clobetasol ointment which can be used on a as needed basis.  He has been also advised to use sunscreen.  He has reduced his allopurinol dose.  We will check his uric acid level today.  The plan of care was discussed as noted above.  Bo Merino, MD  Note - This record has been created using Editor, commissioning.  Chart creation errors have been sought, but may not always  have been located. Such creation errors  do not reflect on  the standard of medical care.

## 2019-02-04 ENCOUNTER — Other Ambulatory Visit: Payer: Self-pay

## 2019-02-04 ENCOUNTER — Ambulatory Visit: Payer: Self-pay | Admitting: Rheumatology

## 2019-02-04 ENCOUNTER — Encounter: Payer: Self-pay | Admitting: Rheumatology

## 2019-02-04 ENCOUNTER — Ambulatory Visit (INDEPENDENT_AMBULATORY_CARE_PROVIDER_SITE_OTHER): Payer: Medicare Other | Admitting: Rheumatology

## 2019-02-04 VITALS — BP 133/75 | HR 51 | Resp 14 | Ht 72.0 in | Wt 240.0 lb

## 2019-02-04 DIAGNOSIS — M19042 Primary osteoarthritis, left hand: Secondary | ICD-10-CM

## 2019-02-04 DIAGNOSIS — Z79899 Other long term (current) drug therapy: Secondary | ICD-10-CM

## 2019-02-04 DIAGNOSIS — M1A09X0 Idiopathic chronic gout, multiple sites, without tophus (tophi): Secondary | ICD-10-CM | POA: Diagnosis not present

## 2019-02-04 DIAGNOSIS — M5136 Other intervertebral disc degeneration, lumbar region: Secondary | ICD-10-CM

## 2019-02-04 DIAGNOSIS — L931 Subacute cutaneous lupus erythematosus: Secondary | ICD-10-CM | POA: Diagnosis not present

## 2019-02-04 DIAGNOSIS — I2510 Atherosclerotic heart disease of native coronary artery without angina pectoris: Secondary | ICD-10-CM

## 2019-02-04 DIAGNOSIS — Z9861 Coronary angioplasty status: Secondary | ICD-10-CM

## 2019-02-04 DIAGNOSIS — M19041 Primary osteoarthritis, right hand: Secondary | ICD-10-CM

## 2019-02-04 DIAGNOSIS — Z87448 Personal history of other diseases of urinary system: Secondary | ICD-10-CM

## 2019-02-04 DIAGNOSIS — M3509 Sicca syndrome with other organ involvement: Secondary | ICD-10-CM

## 2019-02-04 DIAGNOSIS — Z5181 Encounter for therapeutic drug level monitoring: Secondary | ICD-10-CM

## 2019-02-04 MED ORDER — CLOBETASOL PROPIONATE 0.05 % EX OINT: 1.0000 "application " | TOPICAL_OINTMENT | Freq: Two times a day (BID) | CUTANEOUS | 0 refills | Status: DC

## 2019-02-05 LAB — CBC WITH DIFFERENTIAL/PLATELET
Absolute Monocytes: 614 cells/uL (ref 200–950)
Basophils Absolute: 37 cells/uL (ref 0–200)
Basophils Relative: 0.6 %
Eosinophils Absolute: 248 cells/uL (ref 15–500)
Eosinophils Relative: 4 %
HCT: 41.4 % (ref 38.5–50.0)
Hemoglobin: 13.9 g/dL (ref 13.2–17.1)
Lymphs Abs: 1321 cells/uL (ref 850–3900)
MCH: 31.4 pg (ref 27.0–33.0)
MCHC: 33.6 g/dL (ref 32.0–36.0)
MCV: 93.7 fL (ref 80.0–100.0)
MPV: 11.8 fL (ref 7.5–12.5)
Monocytes Relative: 9.9 %
Neutro Abs: 3980 cells/uL (ref 1500–7800)
Neutrophils Relative %: 64.2 %
Platelets: 222 10*3/uL (ref 140–400)
RBC: 4.42 10*6/uL (ref 4.20–5.80)
RDW: 12.7 % (ref 11.0–15.0)
Total Lymphocyte: 21.3 %
WBC: 6.2 10*3/uL (ref 3.8–10.8)

## 2019-02-05 LAB — COMPLETE METABOLIC PANEL WITH GFR
AG Ratio: 1.5 (calc) (ref 1.0–2.5)
ALT: 24 U/L (ref 9–46)
AST: 26 U/L (ref 10–35)
Albumin: 4.4 g/dL (ref 3.6–5.1)
Alkaline phosphatase (APISO): 87 U/L (ref 35–144)
BUN/Creatinine Ratio: 19 (calc) (ref 6–22)
BUN: 24 mg/dL (ref 7–25)
CO2: 31 mmol/L (ref 20–32)
Calcium: 9.4 mg/dL (ref 8.6–10.3)
Chloride: 103 mmol/L (ref 98–110)
Creat: 1.25 mg/dL — ABNORMAL HIGH (ref 0.70–1.18)
GFR, Est African American: 65 mL/min/{1.73_m2} (ref >=60)
GFR, Est Non African American: 56 mL/min/{1.73_m2} — ABNORMAL LOW (ref >=60)
Globulin: 2.9 g/dL (calc) (ref 1.9–3.7)
Glucose, Bld: 93 mg/dL (ref 65–99)
Potassium: 4.8 mmol/L (ref 3.5–5.3)
Sodium: 143 mmol/L (ref 135–146)
Total Bilirubin: 0.4 mg/dL (ref 0.2–1.2)
Total Protein: 7.3 g/dL (ref 6.1–8.1)

## 2019-02-05 LAB — URIC ACID: Uric Acid, Serum: 5.1 mg/dL (ref 4.0–8.0)

## 2019-02-07 NOTE — Progress Notes (Signed)
CBC WNL.  Creatinine is elevated-1.25.  GFR is 56.  He is taking Lasix.  Please forward labs to PCP.  Rest of CMP WNL.  Uric acid is 5.1, which is within the desirable range.

## 2019-02-09 ENCOUNTER — Encounter: Payer: Self-pay | Admitting: Rheumatology

## 2019-02-09 DIAGNOSIS — L931 Subacute cutaneous lupus erythematosus: Secondary | ICD-10-CM

## 2019-02-09 DIAGNOSIS — M3509 Sicca syndrome with other organ involvement: Secondary | ICD-10-CM

## 2019-02-21 ENCOUNTER — Other Ambulatory Visit: Payer: Self-pay | Admitting: Rheumatology

## 2019-02-22 NOTE — Telephone Encounter (Signed)
Last Visit: 02/04/19 Next Visit: 05/27/19 Labs: 02/04/19 CBC WNL. Creatinine is elevated-1.25. GFR is 56. Rest of CMP WNL. Uric acid is 5.1,  Okay to refill per Dr. Estanislado Pandy

## 2019-03-02 DIAGNOSIS — M3509 Sicca syndrome with other organ involvement: Secondary | ICD-10-CM | POA: Diagnosis not present

## 2019-03-02 DIAGNOSIS — H43813 Vitreous degeneration, bilateral: Secondary | ICD-10-CM | POA: Diagnosis not present

## 2019-03-02 DIAGNOSIS — M3210 Systemic lupus erythematosus, organ or system involvement unspecified: Secondary | ICD-10-CM | POA: Diagnosis not present

## 2019-03-02 DIAGNOSIS — H35383 Toxic maculopathy, bilateral: Secondary | ICD-10-CM | POA: Diagnosis not present

## 2019-03-11 ENCOUNTER — Encounter: Payer: Self-pay | Admitting: Rheumatology

## 2019-03-15 ENCOUNTER — Other Ambulatory Visit: Payer: Self-pay | Admitting: Family Medicine

## 2019-04-18 ENCOUNTER — Other Ambulatory Visit: Payer: Self-pay

## 2019-04-18 MED ORDER — LEVOTHYROXINE SODIUM 100 MCG PO TABS: ORAL_TABLET | ORAL | 0 refills | Status: DC

## 2019-04-22 ENCOUNTER — Ambulatory Visit: Payer: Medicare Other | Admitting: Physician Assistant

## 2019-05-05 ENCOUNTER — Other Ambulatory Visit: Payer: Self-pay | Admitting: Family Medicine

## 2019-05-06 NOTE — Telephone Encounter (Signed)
Spoke with pt and informed him of the Dr. Bebe Shaggy message. Pt agreed to an appointment. This was made in Jan per pt's request. He had no additional questions at this time.

## 2019-05-20 HISTORY — PX: COLONOSCOPY: SHX174

## 2019-05-24 NOTE — Progress Notes (Deleted)
Virtual Visit via Telephone Note  I connected with Dennis Moon on 05/24/19 at  9:30 AM EST by telephone and verified that I am speaking with the correct person using two identifiers.  Location: Patient: Home  Provider: Clinic  This service was conducted via virtual visit.   The patient was located at home. I was located in my office.  Consent was obtained prior to the virtual visit and is aware of possible charges through their insurance for this visit.  The patient is an established patient.  Dr. Estanislado Pandy, MD conducted the virtual visit and Hazel Sams, PA-C acted as scribe during the service.  Office staff helped with scheduling follow up visits after the service was conducted.   I discussed the limitations, risks, security and privacy concerns of performing an evaluation and management service by telephone and the availability of in person appointments. I also discussed with the patient that there may be a patient responsible charge related to this service. The patient expressed understanding and agreed to proceed.  CC: History of Present Illness: Dennis Moon is a 74 y.o. male with a history of subcutaneous lupus, Sjogren's syndrome, and gout.  Patient reports that he discontinued Plaquenil after an abnormal Plaquenil eye exam.  According to Dr. Lissa Morales note central vision was depressed.  Dr. Arsenio Loader did not recommend continuing on Plaquenil.   Review of Systems  Constitutional: Negative for fever and malaise/fatigue.  Eyes: Negative for photophobia, pain, discharge and redness.  Respiratory: Negative for cough, shortness of breath and wheezing.   Cardiovascular: Negative for chest pain and palpitations.  Gastrointestinal: Negative for blood in stool, constipation and diarrhea.  Genitourinary: Negative for dysuria.  Musculoskeletal: Negative for back pain, joint pain, myalgias and neck pain.  Skin: Negative for rash.  Neurological: Negative for dizziness and headaches.   Psychiatric/Behavioral: Negative for depression. The patient is not nervous/anxious and does not have insomnia.    Observations/Objective:  Physical Exam  Constitutional: He is oriented to person, place, and time.  Neurological: He is alert and oriented to person, place, and time.  Psychiatric: Mood, memory, affect and judgment normal.     Patient reports morning stiffness for *** {minute/hour:19697}.   Patient {Actions; denies-reports:120008} nocturnal pain.  Difficulty dressing/grooming: {ACTIONS;DENIES/REPORTS:21021675::"Denies"} Difficulty climbing stairs: {ACTIONS;DENIES/REPORTS:21021675::"Denies"} Difficulty getting out of chair: {ACTIONS;DENIES/REPORTS:21021675::"Denies"} Difficulty using hands for taps, buttons, cutlery, and/or writing: {ACTIONS;DENIES/REPORTS:21021675::"Denies"}  Assessment and Plan: Visit Diagnoses: Subacute cutaneous lupus erythematosus:  High risk medication use - d/c PLQ due to abnormal eye exam 11/2018.  Since her vision was depressed on exam.  Dr. Arsenio Loader did not recommend continuing on Plaquenil. We will not restart him on Plaquenil.  He does not want to start the methotrexate at this time.  He will use topical clobetasol ointment as needed and we stressed the importance of wearing sunscreen greater than SPF 50 on a daily basis.  Idiopathic chronic gout of multiple sites without tophus - He has not had any recent gout flares.  He takes allopurinol 300 mg 1 tablet by mouth daily. He reduced Allopurinol from 450 mg to 300 mg and has not noticed any signs or symptoms of a flare.  uric acid: 02/04/2019 5.1  Sjogren's syndrome with other organ involvement (HCC) - Positive ANA, positive Ro, positive La: He has chronic sicca symptoms.    Primary osteoarthritis of both hands  DDD (degenerative disc disease), lumbar  Other medical conditions are listed as follows:   CAD S/P percutaneous coronary angioplasty  History of chronic kidney disease  Follow  Up Instructions:    I discussed the assessment and treatment plan with the patient. The patient was provided an opportunity to ask questions and all were answered. The patient agreed with the plan and demonstrated an understanding of the instructions.   The patient was advised to call back or seek an in-person evaluation if the symptoms worsen or if the condition fails to improve as anticipated.  I provided *** minutes of non-face-to-face time during this encounter.   Earnestine Mealing, CMA

## 2019-05-26 ENCOUNTER — Other Ambulatory Visit: Payer: Self-pay | Admitting: Rheumatology

## 2019-05-26 NOTE — Telephone Encounter (Signed)
Last Visit: 02/04/19 Next Visit: 05/27/19 Labs: 02/04/19 CBC WNL. Creatinine is elevated-1.25. GFR is 56. Rest of CMP WNL. Uric acid is 5.1,  Okay to refill per Dr. Estanislado Pandy

## 2019-05-27 ENCOUNTER — Telehealth: Payer: Medicare Other | Admitting: Rheumatology

## 2019-05-27 ENCOUNTER — Encounter: Payer: Medicare Other | Admitting: Physician Assistant

## 2019-05-27 ENCOUNTER — Ambulatory Visit: Payer: Medicare Other | Admitting: Family Medicine

## 2019-05-28 NOTE — Progress Notes (Signed)
This encounter was created in error - please disregard.

## 2019-06-01 ENCOUNTER — Telehealth: Payer: Self-pay | Admitting: Cardiology

## 2019-06-01 DIAGNOSIS — E785 Hyperlipidemia, unspecified: Secondary | ICD-10-CM

## 2019-06-01 DIAGNOSIS — I2510 Atherosclerotic heart disease of native coronary artery without angina pectoris: Secondary | ICD-10-CM

## 2019-06-01 DIAGNOSIS — I10 Essential (primary) hypertension: Secondary | ICD-10-CM

## 2019-06-01 DIAGNOSIS — M109 Gout, unspecified: Secondary | ICD-10-CM

## 2019-06-01 DIAGNOSIS — Z9861 Coronary angioplasty status: Secondary | ICD-10-CM

## 2019-06-01 NOTE — Telephone Encounter (Signed)
New Message:     Pt wants to know if he need lab work before his appt on Friday(06-03-19) with Isaac Laud?

## 2019-06-01 NOTE — Telephone Encounter (Signed)
Returned call to patient's wife advised patient is due for fasting lab,bmet,lipid and hepatic panels,cbc,uric acid.Orders placed.Stated he will have done at a [facility] near their home.

## 2019-06-02 DIAGNOSIS — I2510 Atherosclerotic heart disease of native coronary artery without angina pectoris: Secondary | ICD-10-CM | POA: Diagnosis not present

## 2019-06-02 DIAGNOSIS — Z9861 Coronary angioplasty status: Secondary | ICD-10-CM | POA: Diagnosis not present

## 2019-06-02 DIAGNOSIS — E785 Hyperlipidemia, unspecified: Secondary | ICD-10-CM | POA: Diagnosis not present

## 2019-06-02 DIAGNOSIS — M109 Gout, unspecified: Secondary | ICD-10-CM | POA: Diagnosis not present

## 2019-06-02 DIAGNOSIS — I10 Essential (primary) hypertension: Secondary | ICD-10-CM | POA: Diagnosis not present

## 2019-06-03 ENCOUNTER — Encounter: Payer: Self-pay | Admitting: Physician Assistant

## 2019-06-03 ENCOUNTER — Other Ambulatory Visit: Payer: Self-pay

## 2019-06-03 ENCOUNTER — Ambulatory Visit (INDEPENDENT_AMBULATORY_CARE_PROVIDER_SITE_OTHER): Payer: Medicare Other | Admitting: Physician Assistant

## 2019-06-03 VITALS — BP 122/74 | HR 72 | Temp 97.2°F | Ht 72.0 in | Wt 239.0 lb

## 2019-06-03 DIAGNOSIS — N183 Chronic kidney disease, stage 3 unspecified: Secondary | ICD-10-CM

## 2019-06-03 DIAGNOSIS — I2510 Atherosclerotic heart disease of native coronary artery without angina pectoris: Secondary | ICD-10-CM

## 2019-06-03 DIAGNOSIS — I10 Essential (primary) hypertension: Secondary | ICD-10-CM

## 2019-06-03 DIAGNOSIS — E785 Hyperlipidemia, unspecified: Secondary | ICD-10-CM | POA: Diagnosis not present

## 2019-06-03 LAB — CBC WITH DIFFERENTIAL/PLATELET
Basophils Absolute: 0 x10E3/uL (ref 0.0–0.2)
Basos: 0 %
EOS (ABSOLUTE): 0.2 x10E3/uL (ref 0.0–0.4)
Eos: 4 %
Hematocrit: 40.8 % (ref 37.5–51.0)
Hemoglobin: 13.4 g/dL (ref 13.0–17.7)
Immature Grans (Abs): 0 x10E3/uL (ref 0.0–0.1)
Immature Granulocytes: 0 %
Lymphocytes Absolute: 1.1 x10E3/uL (ref 0.7–3.1)
Lymphs: 18 %
MCH: 31 pg (ref 26.6–33.0)
MCHC: 32.8 g/dL (ref 31.5–35.7)
MCV: 94 fL (ref 79–97)
Monocytes Absolute: 0.6 x10E3/uL (ref 0.1–0.9)
Monocytes: 10 %
Neutrophils Absolute: 3.9 x10E3/uL (ref 1.4–7.0)
Neutrophils: 68 %
Platelets: 223 x10E3/uL (ref 150–450)
RBC: 4.32 x10E6/uL (ref 4.14–5.80)
RDW: 12.9 % (ref 11.6–15.4)
WBC: 5.8 x10E3/uL (ref 3.4–10.8)

## 2019-06-03 LAB — BASIC METABOLIC PANEL
BUN/Creatinine Ratio: 13 (ref 10–24)
BUN: 19 mg/dL (ref 8–27)
CO2: 26 mmol/L (ref 20–29)
Calcium: 9.5 mg/dL (ref 8.6–10.2)
Chloride: 102 mmol/L (ref 96–106)
Creatinine, Ser: 1.42 mg/dL — ABNORMAL HIGH (ref 0.76–1.27)
GFR calc Af Amer: 56 mL/min/{1.73_m2} — ABNORMAL LOW (ref >59)
GFR calc non Af Amer: 48 mL/min/{1.73_m2} — ABNORMAL LOW (ref >59)
Glucose: 96 mg/dL (ref 65–99)
Potassium: 4.4 mmol/L (ref 3.5–5.2)
Sodium: 144 mmol/L (ref 134–144)

## 2019-06-03 LAB — LIPID PANEL
Chol/HDL Ratio: 3.2 ratio (ref 0.0–5.0)
Cholesterol, Total: 154 mg/dL (ref 100–199)
HDL: 48 mg/dL (ref >39)
LDL Chol Calc (NIH): 79 mg/dL (ref 0–99)
Triglycerides: 155 mg/dL — ABNORMAL HIGH (ref 0–149)
VLDL Cholesterol Cal: 27 mg/dL (ref 5–40)

## 2019-06-03 LAB — HEPATIC FUNCTION PANEL
ALT: 29 IU/L (ref 0–44)
AST: 28 IU/L (ref 0–40)
Albumin: 4.5 g/dL (ref 3.7–4.7)
Alkaline Phosphatase: 91 IU/L (ref 39–117)
Bilirubin Total: 0.3 mg/dL (ref 0.0–1.2)
Bilirubin, Direct: 0.12 mg/dL (ref 0.00–0.40)
Total Protein: 7.4 g/dL (ref 6.0–8.5)

## 2019-06-03 LAB — URIC ACID: Uric Acid: 5.4 mg/dL (ref 3.8–8.4)

## 2019-06-03 NOTE — Progress Notes (Signed)
Cardiology Office Note:    Date:  06/05/2019   ID:  Dennis Moon, DOB 05-01-1946, MRN [id number]  PCP:  [name], MD  Cardiologist:  [name], MD  Electrophysiologist:  None   Referring MD: [name],*   Chief Complaint  Patient presents with  . Follow-up    seen for Dr. Martinique    History of Present Illness:    Dennis Moon is a 74 y.o. male with a hx of HTN, HLD, CAD, Sjogren's disease, subacute cutaneous lupus erythematous, gout and stage III CKD.  He is being followed by rheumatology service.  He was admitted for NSTEMI in June 2018.  Echocardiogram obtained on 12/12/2016 showed EF 60 to 65%, grade 1 DD.  Cardiac catheterization at the time showed two-vessel obstructive CAD with 90% proximal LAD stenosis and 80% proximal left circumflex lesion.  He underwent DES placement to both area.  Postprocedure, he was started on aspirin and Brilinta.  He was last seen by Dr. Martinique in June 2020 at which time he was doing well.  Patient presents today for cardiology office visit.  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  His renal function has been followed by Dr. Joelyn Oms of [facility].  He denies any recent chest pain or shortness of breath.  We have reviewed his recent lab work.  His LDL is borderline high however previously his LDL has always been in the 40s.  We discussed various options including observation versus addition of low-dose Zetia, we opted to proceed with observation for the time being.  He will have a repeat fasting lipid panel and a CMP in 6 months prior to his next follow-up.   Past Medical History:  Diagnosis Date  . CAD S/P percutaneous coronary angioplasty 12/12/2016   a. 11/2016: NSTEMI with DES to LAD and DES to LCx  . Gout   . Hypertension   . Hypothyroidism   . Migraine   . Sjogren's disease ([place])   . Systemic lupus erythematosus ([place])     Past Surgical History:  Procedure  Laterality Date  . CATARACT EXTRACTION, BILATERAL Bilateral 2017  . CORONARY STENT INTERVENTION N/A 12/12/2016   Procedure: Coronary Stent Intervention;  Surgeon: Martinique, Peter M, MD;  Location: [place] [facility];  Service: Cardiovascular;  Laterality: N/A;  . HERNIA REPAIR    . LEFT HEART CATH AND CORONARY ANGIOGRAPHY N/A 12/12/2016   Procedure: Left Heart Cath and Coronary Angiography;  Surgeon: Martinique, Peter M, MD;  Location: [place] [facility];  Service: Cardiovascular;  Laterality: N/A;  . THYROID SURGERY  1997    Current Medications: Current Meds  Medication Sig  . acyclovir ointment (ZOVIRAX) 5 % Apply 1 application topically as needed.  . Ascorbic Acid (VITAMIN C) 1000 MG tablet Take 1,000 mg by mouth daily.  [name] aspirin EC 81 MG EC tablet Take 1 tablet (81 mg total) by mouth daily.  [name] aspirin-acetaminophen-caffeine (EXCEDRIN EXTRA STRENGTH) 250-250-65 MG tablet Take by mouth every 6 (six) hours as needed for headache.  [name] atorvastatin (LIPITOR) 80 MG tablet Take 1 tablet (80 mg total) by mouth daily at 6 PM.  . Calcium Carb-Cholecalciferol (CALCIUM 600 + D PO) Take 1 tablet by mouth daily.  . clobetasol ointment (TEMOVATE) AB-123456789 % Apply 1 application topically 2 (two) times daily.  [name] CRANBERRY EXTRACT PO Take 1 tablet by mouth daily.  . DULoxetine (CYMBALTA) 60 MG capsule TAKE 1 CAPSULE BY MOUTH  DAILY  . esomeprazole (NEXIUM 24HR) 20 MG capsule Takes 2 capsules ( 40 mg ) every night  . levothyroxine (SYNTHROID) 100 MCG tablet TAKE 1 TABLET(100 MCG) BY MOUTH AT BEDTIME  . Multiple Vitamins-Minerals (MULTIVITAMIN PO) Take 1 tablet by mouth daily.  . Probiotic Product (PROBIOTIC PO) Take 1 capsule by mouth daily.  . propranolol (INDERAL) 60 MG tablet TAKE 1 TABLET(60 MG) BY MOUTH TWICE DAILY  . testosterone (ANDROGEL) 50 MG/5GM (1%) GEL APPLY 5G(1PACKET) ONTO THE SKIN TWICE DAILY     Allergies:   Bee venom   Social History   Socioeconomic History  . Marital status: Married     Spouse name: Not on file  . Number of children: Not on file  . Years of education: Not on file  . Highest education level: Not on file  Occupational History  . Not on file  Tobacco Use  . Smoking status: Former Smoker    Packs/day: 1.00    Years: 6.00    Pack years: 6.00    Quit date: 04/25/1966    Years since quitting: 53.1  . Smokeless tobacco: Never Used  Substance and Sexual Activity  . Alcohol use: Yes    Alcohol/week: 7.0 standard drinks    Types: 7 Glasses of wine per week  . Drug use: Never  . Sexual activity: Not on file  Other Topics Concern  . Not on file  Social History Narrative  . Not on file   Social Determinants of Health   Financial Resource Strain:   . Difficulty of Paying Living Expenses: Not on file  Food Insecurity:   . Worried About Charity fundraiser in the Last Year: Not on file  . Ran Out of Food in the Last Year: Not on file  Transportation Needs:   . Lack of Transportation (Medical): Not on file  . Lack of Transportation (Non-Medical): Not on file  Physical Activity:   . Days of Exercise per Week: Not on file  . Minutes of Exercise per Session: Not on file  Stress:   . Feeling of Stress : Not on file  Social Connections:   . Frequency of Communication with Friends and Family: Not on file  . Frequency of Social Gatherings with Friends and Family: Not on file  . Attends Religious Services: Not on file  . Active Member of Clubs or Organizations: Not on file  . Attends Archivist Meetings: Not on file  . Marital Status: Not on file     Family History: The patient's family history includes Diabetes in his mother; Healthy in his son; Hypertension in his father and mother.  ROS:   Please see the history of present illness.     All other systems reviewed and are negative.  EKGs/Labs/Other Studies Reviewed:    The following studies were reviewed today:  Cath 12/12/2016  Mid LAD lesion, 40 %stenosed.  The left ventricular  systolic function is normal.  LV end diastolic pressure is normal.  The left ventricular ejection fraction is 55-65% by visual estimate.  Prox LAD lesion, 90 %stenosed.  A STENT PROMUS PREM MR 4.0X20 drug eluting stent was successfully placed.  Post intervention, there is a 0% residual stenosis.  Ost Cx to Prox Cx lesion, 80 %stenosed.  A STENT XIENCE ALPINE RX 3.0X18 drug eluting stent was successfully placed.  Post intervention, there is a 0% residual stenosis.   1. 2 vessel obstructive CAD 2. Normal LV function 3. Normal LVEDP 4. Successful stenting  of the proximal LAD with DES 5. Successful stenting of the proximal LCx with DES.  Plan: DAPT for one year. Anticipate DC in am.    EKG:  EKG is ordered today.  The ekg ordered today demonstrates normal sinus rhythm without significant ST-T changes  Recent Labs: 06/02/2019: ALT 29; BUN 19; Creatinine, Ser 1.42; Hemoglobin 13.4; Platelets 223; Potassium 4.4; Sodium 144  Recent Lipid Panel    Component Value Date/Time   CHOL 154 06/02/2019 0814   TRIG 155 (H) 06/02/2019 0814   HDL 48 06/02/2019 0814   CHOLHDL 3.2 06/02/2019 0814   CHOLHDL 4.2 12/12/2016 0330   VLDL 33 12/12/2016 0330   LDLCALC 79 06/02/2019 0814    Physical Exam:    VS:  BP 122/74   Pulse 72   Temp (!) 97.2 F (36.2 C)   Ht 6' (1.829 m)   Wt 239 lb (108.4 kg)   SpO2 95%   BMI 32.41 kg/m     Wt Readings from Last 3 Encounters:  06/03/19 239 lb (108.4 kg)  02/04/19 240 lb (108.9 kg)  10/29/18 240 lb (108.9 kg)     GEN:  Well nourished, well developed in no acute distress HEENT: Normal NECK: No JVD; No carotid bruits LYMPHATICS: No lymphadenopathy CARDIAC: RRR, no murmurs, rubs, gallops RESPIRATORY:  Clear to auscultation without rales, wheezing or rhonchi  ABDOMEN: Soft, non-tender, non-distended MUSCULOSKELETAL:  No edema; No deformity  SKIN: Warm and dry NEUROLOGIC:  Alert and oriented x 3 PSYCHIATRIC:  Normal affect   ASSESSMENT:     1. Coronary artery disease involving native coronary artery of native heart without angina pectoris   2. Hyperlipidemia, unspecified hyperlipidemia type   3. Essential hypertension   4. Stage 3 chronic kidney disease, unspecified whether stage 3a or 3b CKD    PLAN:    In order of problems listed above:  1. CAD: Patient denies any recent chest discomfort.  Continue aspirin and Lipitor.  2. Hypertension: Blood pressure stable on current therapy  3. Hyperlipidemia: Recent lab work showed borderline elevated LDL.  We will continue observation on the current therapy.  On Lipitor 80 mg daily.  Continue diet and exercise.  Repeat fasting lipid panel and CMP prior to the next visit.  4. CKD stage III: We will need CMP prior to the next office visit.   Medication Adjustments/Labs and Tests Ordered: Current medicines are reviewed at length with the patient today.  Concerns regarding medicines are outlined above.  Orders Placed This Encounter  Procedures  . Lipid panel  . Hepatic function panel  . EKG 12-Lead   No orders of the defined types were placed in this encounter.   Patient Instructions  Medication Instructions:  Your physician recommends that you continue on your current medications as directed. Please refer to the Current Medication list given to you today.  *If you need a refill on your cardiac medications before your next appointment, please call your pharmacy*  Lab Work: Your physician recommends that you return for lab work in 6 months:  Fasting Lipid Panel  Hepatic Function   If you have labs (blood work) drawn today and your tests are completely normal, you will receive your results only by: [name] MyChart Message (if you have MyChart) OR . A paper copy in the mail If you have any lab test that is abnormal or we need to change your treatment, we will call you to review the results.  Testing/Procedures: NONE ordered at this time of  appointment   Follow-Up: At  [facility], you and your health needs are our priority.  As part of our continuing mission to provide you with exceptional heart care, we have created designated Provider Care Teams.  These Care Teams include your primary Cardiologist (physician) and Advanced Practice Providers (APPs -  Physician Assistants and Nurse Practitioners) who all work together to provide you with the care you need, when you need it.  Your next appointment:   6 month(s)  The format for your next appointment:   In Person  Provider:   [name], MD  Other Instructions      Signed, Almyra Deforest, [place]  06/05/2019 9:51 PM    [place]

## 2019-06-03 NOTE — Patient Instructions (Signed)
Medication Instructions:  Your physician recommends that you continue on your current medications as directed. Please refer to the Current Medication list given to you today.  *If you need a refill on your cardiac medications before your next appointment, please call your pharmacy*  Lab Work: Your physician recommends that you return for lab work in 6 months:  Fasting Lipid Panel  Hepatic Function   If you have labs (blood work) drawn today and your tests are completely normal, you will receive your results only by: [name] MyChart Message (if you have MyChart) OR . A paper copy in the mail If you have any lab test that is abnormal or we need to change your treatment, we will call you to review the results.  Testing/Procedures: NONE ordered at this time of appointment   Follow-Up: At [facility], you and your health needs are our priority.  As part of our continuing mission to provide you with exceptional heart care, we have created designated Provider Care Teams.  These Care Teams include your primary Cardiologist (physician) and Advanced Practice Providers (APPs -  Physician Assistants and Nurse Practitioners) who all work together to provide you with the care you need, when you need it.  Your next appointment:   6 month(s)  The format for your next appointment:   In Person  Provider:   [name], MD  Other Instructions

## 2019-06-05 ENCOUNTER — Encounter: Payer: Self-pay | Admitting: Physician Assistant

## 2019-06-07 ENCOUNTER — Other Ambulatory Visit: Payer: Self-pay | Admitting: Family Medicine

## 2019-06-10 ENCOUNTER — Ambulatory Visit: Payer: Medicare Other | Admitting: Family Medicine

## 2019-06-10 ENCOUNTER — Telehealth: Payer: Medicare Other | Admitting: Rheumatology

## 2019-06-17 ENCOUNTER — Telehealth: Payer: Self-pay | Admitting: Family Medicine

## 2019-06-17 ENCOUNTER — Other Ambulatory Visit: Payer: Self-pay | Admitting: Family Medicine

## 2019-06-17 DIAGNOSIS — E785 Hyperlipidemia, unspecified: Secondary | ICD-10-CM | POA: Diagnosis not present

## 2019-06-17 NOTE — Telephone Encounter (Signed)
Orders faxed

## 2019-06-17 NOTE — Telephone Encounter (Signed)
Dennis Moon is calling and stated that the patient is at [facility] waiting for orders to be faxed for blood work to be done. Fax orders to [phone]

## 2019-06-18 ENCOUNTER — Ambulatory Visit: Payer: Self-pay

## 2019-06-18 LAB — LIPID PANEL
Chol/HDL Ratio: 2.9 ratio (ref 0.0–5.0)
Cholesterol, Total: 119 mg/dL (ref 100–199)
HDL: 41 mg/dL (ref >39)
LDL Chol Calc (NIH): 58 mg/dL (ref 0–99)
Triglycerides: 111 mg/dL (ref 0–149)
VLDL Cholesterol Cal: 20 mg/dL (ref 5–40)

## 2019-06-18 LAB — HEPATIC FUNCTION PANEL
ALT: 25 IU/L (ref 0–44)
AST: 27 IU/L (ref 0–40)
Albumin: 4.2 g/dL (ref 3.7–4.7)
Alkaline Phosphatase: 85 IU/L (ref 39–117)
Bilirubin Total: 0.3 mg/dL (ref 0.0–1.2)
Bilirubin, Direct: 0.1 mg/dL (ref 0.00–0.40)
Total Protein: 7.1 g/dL (ref 6.0–8.5)

## 2019-06-19 ENCOUNTER — Other Ambulatory Visit: Payer: Self-pay | Admitting: Family Medicine

## 2019-06-19 NOTE — Telephone Encounter (Signed)
Forwarding to PCP for review.  

## 2019-06-23 ENCOUNTER — Other Ambulatory Visit: Payer: Self-pay

## 2019-06-24 ENCOUNTER — Encounter: Payer: Self-pay | Admitting: Family Medicine

## 2019-06-24 ENCOUNTER — Ambulatory Visit (INDEPENDENT_AMBULATORY_CARE_PROVIDER_SITE_OTHER): Payer: Medicare Other | Admitting: Family Medicine

## 2019-06-24 VITALS — BP 128/70 | HR 71 | Temp 96.1°F | Ht 72.0 in | Wt 237.8 lb

## 2019-06-24 DIAGNOSIS — R519 Headache, unspecified: Secondary | ICD-10-CM | POA: Diagnosis not present

## 2019-06-24 DIAGNOSIS — E291 Testicular hypofunction: Secondary | ICD-10-CM | POA: Diagnosis not present

## 2019-06-24 DIAGNOSIS — E039 Hypothyroidism, unspecified: Secondary | ICD-10-CM | POA: Diagnosis not present

## 2019-06-24 LAB — TSH: TSH: 3.39 u[IU]/mL (ref 0.35–4.50)

## 2019-06-24 LAB — TESTOSTERONE: Testosterone: 94.36 ng/dL — ABNORMAL LOW (ref 300.00–890.00)

## 2019-06-24 MED ORDER — PROPRANOLOL HCL 60 MG PO TABS: ORAL_TABLET | ORAL | 4 refills | Status: DC

## 2019-06-24 NOTE — Addendum Note (Signed)
Addended by: Lynnea Ferrier on: 06/24/2019 09:09 AM   Modules accepted: Orders

## 2019-06-24 NOTE — Progress Notes (Addendum)
Established Patient Office Visit  Subjective:  Patient ID: Dennis Moon, male    DOB: 01/03/1946  Age: 74 y.o. MRN: [id number]  CC: No chief complaint on file.   HPI Dennis Moon presents for follow-up of his hypothyroidism and androgen deficiency.  Taking the levothyroxine and androgen gel daily.  He uses propranolol for headaches and it has been quite effective for him over the years.  The medication has also help steady his hand.  Had to discontinue hydroxychloroquine secondary to visual changes.  Has decreased his allopurinol down to 300 mg from 450 because of decreased GFR.  Second Covid vaccine next week.  Past Medical History:  Diagnosis Date  . CAD S/P percutaneous coronary angioplasty 12/12/2016   a. 11/2016: NSTEMI with DES to LAD and DES to LCx  . Gout   . Hypertension   . Hypothyroidism   . Migraine   . Sjogren's disease ([place])   . Systemic lupus erythematosus ([place])     Past Surgical History:  Procedure Laterality Date  . CATARACT EXTRACTION, BILATERAL Bilateral 2017  . CORONARY STENT INTERVENTION N/A 12/12/2016   Procedure: Coronary Stent Intervention;  Surgeon: Martinique, Peter M, MD;  Location: [place] [facility];  Service: Cardiovascular;  Laterality: N/A;  . HERNIA REPAIR    . LEFT HEART CATH AND CORONARY ANGIOGRAPHY N/A 12/12/2016   Procedure: Left Heart Cath and Coronary Angiography;  Surgeon: Martinique, Peter M, MD;  Location: [place] [facility];  Service: Cardiovascular;  Laterality: N/A;  . THYROID SURGERY  1997    Family History  Problem Relation Age of Onset  . Diabetes Mother   . Hypertension Mother   . Hypertension Father   . Healthy Son     Social History   Socioeconomic History  . Marital status: Married    Spouse name: Not on file  . Number of children: Not on file  . Years of education: Not on file  . Highest education level: Not on file  Occupational History  . Not on file  Tobacco Use  . Smoking status: Former Smoker   Packs/day: 1.00    Years: 6.00    Pack years: 6.00    Quit date: 04/25/1966    Years since quitting: 53.2  . Smokeless tobacco: Never Used  Substance and Sexual Activity  . Alcohol use: Yes    Alcohol/week: 7.0 standard drinks    Types: 7 Glasses of wine per week  . Drug use: Never  . Sexual activity: Not on file  Other Topics Concern  . Not on file  Social History Narrative  . Not on file   Social Determinants of Health   Financial Resource Strain:   . Difficulty of Paying Living Expenses: Not on file  Food Insecurity:   . Worried About Charity fundraiser in the Last Year: Not on file  . Ran Out of Food in the Last Year: Not on file  Transportation Needs:   . Lack of Transportation (Medical): Not on file  . Lack of Transportation (Non-Medical): Not on file  Physical Activity:   . Days of Exercise per Week: Not on file  . Minutes of Exercise per Session: Not on file  Stress:   . Feeling of Stress : Not on file  Social Connections:   . Frequency of Communication with Friends and Family: Not on file  . Frequency of Social Gatherings with Friends and Family: Not on file  . Attends Religious Services: Not on file  .  Active Member of Clubs or Organizations: Not on file  . Attends Archivist Meetings: Not on file  . Marital Status: Not on file  Intimate Partner Violence:   . Fear of Current or Ex-Partner: Not on file  . Emotionally Abused: Not on file  . Physically Abused: Not on file  . Sexually Abused: Not on file    Outpatient Medications Prior to Visit  Medication Sig Dispense Refill  . acyclovir ointment (ZOVIRAX) 5 % Apply 1 application topically as needed. 15 g 0  . allopurinol (ZYLOPRIM) 300 MG tablet TAKE 1 AND 1/2 TABLETS BY MOUTH EVERY DAY 135 tablet 0  . Ascorbic Acid (VITAMIN C) 1000 MG tablet Take 1,000 mg by mouth daily.    [name] aspirin EC 81 MG EC tablet Take 1 tablet (81 mg total) by mouth daily.    [name] aspirin-acetaminophen-caffeine (EXCEDRIN EXTRA  STRENGTH) 250-250-65 MG tablet Take by mouth every 6 (six) hours as needed for headache.    [name] atorvastatin (LIPITOR) 80 MG tablet Take 1 tablet (80 mg total) by mouth daily at 6 PM. 90 tablet 3  . Calcium Carb-Cholecalciferol (CALCIUM 600 + D PO) Take 1 tablet by mouth daily.    [name] CRANBERRY EXTRACT PO Take 1 tablet by mouth daily.    . DULoxetine (CYMBALTA) 60 MG capsule TAKE 1 CAPSULE BY MOUTH DAILY 30 capsule 0  . esomeprazole (NEXIUM 24HR) 20 MG capsule Takes 2 capsules ( 40 mg ) every night 60 capsule 6  . Multiple Vitamins-Minerals (MULTIVITAMIN PO) Take 1 tablet by mouth daily.    . nitroGLYCERIN (NITROSTAT) 0.4 MG SL tablet Place 1 tablet (0.4 mg total) under the tongue every 5 (five) minutes as needed for chest pain. 25 tablet 6  . Probiotic Product (PROBIOTIC PO) Take 1 capsule by mouth daily.    [name] levothyroxine (SYNTHROID) 100 MCG tablet TAKE 1 TABLET(100 MCG) BY MOUTH AT BEDTIME 90 tablet 0  . propranolol (INDERAL) 60 MG tablet TAKE 1 TABLET(60 MG) BY MOUTH TWICE DAILY 180 tablet 0  . testosterone (ANDROGEL) 50 MG/5GM (1%) GEL APPLY 5G(1PACKET) ONTO THE SKIN TWICE DAILY 300 g 2  . clobetasol ointment (TEMOVATE) AB-123456789 % Apply 1 application topically 2 (two) times daily. (Patient not taking: Reported on 06/24/2019) 60 g 0   No facility-administered medications prior to visit.    Allergies  Allergen Reactions  . Bee Venom Swelling    ROS Review of Systems  Constitutional: Negative.   HENT: Negative.   Eyes: Negative for photophobia and redness.  Respiratory: Negative.  Negative for shortness of breath.   Cardiovascular: Negative.  Negative for chest pain and palpitations.  Gastrointestinal: Negative.   Endocrine: Negative for cold intolerance and heat intolerance.  Genitourinary: Positive for urgency. Negative for difficulty urinating and frequency.  Musculoskeletal: Negative for gait problem and joint swelling.  Allergic/Immunologic: Negative for immunocompromised state.    Neurological: Negative for light-headedness and headaches.  Hematological: Does not bruise/bleed easily.  Psychiatric/Behavioral: Negative.       Objective:    Physical Exam  BP 128/70   Pulse 71   Temp (!) 96.1 F (35.6 C) (Tympanic)   Ht 6' (1.829 m)   Wt 237 lb 12.8 oz (107.9 kg)   SpO2 98%   BMI 32.25 kg/m  Wt Readings from Last 3 Encounters:  06/24/19 237 lb 12.8 oz (107.9 kg)  06/03/19 239 lb (108.4 kg)  02/04/19 240 lb (108.9 kg)     Health Maintenance Due  Topic Date  Due  . TETANUS/TDAP  07/29/2017    There are no preventive care reminders to display for this patient.  Lab Results  Component Value Date   TSH 3.39 06/24/2019   Lab Results  Component Value Date   WBC 5.8 06/02/2019   HGB 13.4 06/02/2019   HCT 40.8 06/02/2019   MCV 94 06/02/2019   PLT 223 06/02/2019   Lab Results  Component Value Date   NA 144 06/02/2019   K 4.4 06/02/2019   CO2 26 06/02/2019   GLUCOSE 96 06/02/2019   BUN 19 06/02/2019   CREATININE 1.42 (H) 06/02/2019   BILITOT 0.3 06/17/2019   ALKPHOS 85 06/17/2019   AST 27 06/17/2019   ALT 25 06/17/2019   PROT 7.1 06/17/2019   ALBUMIN 4.2 06/17/2019   CALCIUM 9.5 06/02/2019   ANIONGAP 9 12/13/2016   GFR 59.11 (L) 04/22/2017   Lab Results  Component Value Date   CHOL 119 06/17/2019   Lab Results  Component Value Date   HDL 41 06/17/2019   Lab Results  Component Value Date   LDLCALC 58 06/17/2019   Lab Results  Component Value Date   TRIG 111 06/17/2019   Lab Results  Component Value Date   CHOLHDL 2.9 06/17/2019   No results found for: HGBA1C    Assessment & Plan:   Problem List Items Addressed This Visit      Endocrine   Androgen deficiency - Primary   Relevant Orders   Testosterone (Completed)   Hypothyroidism   Relevant Medications   propranolol (INDERAL) 60 MG tablet   levothyroxine (SYNTHROID) 100 MCG tablet   Other Relevant Orders   TSH (Completed)     Other   Nonintractable headache    Relevant Medications   propranolol (INDERAL) 60 MG tablet      Meds ordered this encounter  Medications  . propranolol (INDERAL) 60 MG tablet    Sig: TAKE 1 TABLET(60 MG) BY MOUTH TWICE DAILY    Dispense:  180 tablet    Refill:  4  . levothyroxine (SYNTHROID) 100 MCG tablet    Sig: TAKE 1 TABLET(100 MCG) BY MOUTH AT BEDTIME    Dispense:  90 tablet    Refill:  2    Follow-up: Return in about 6 months (around 12/22/2019).    [name], MD

## 2019-06-26 ENCOUNTER — Other Ambulatory Visit: Payer: Self-pay | Admitting: Family Medicine

## 2019-06-26 DIAGNOSIS — E291 Testicular hypofunction: Secondary | ICD-10-CM

## 2019-06-28 MED ORDER — LEVOTHYROXINE SODIUM 100 MCG PO TABS: ORAL_TABLET | ORAL | 2 refills | Status: DC

## 2019-06-28 NOTE — Addendum Note (Signed)
Addended by: Jon Billings on: 06/28/2019 08:24 AM   Modules accepted: Orders

## 2019-06-30 ENCOUNTER — Telehealth: Payer: Self-pay | Admitting: Rheumatology

## 2019-06-30 DIAGNOSIS — Z79899 Other long term (current) drug therapy: Secondary | ICD-10-CM

## 2019-06-30 DIAGNOSIS — M1A09X0 Idiopathic chronic gout, multiple sites, without tophus (tophi): Secondary | ICD-10-CM

## 2019-06-30 NOTE — Telephone Encounter (Signed)
Patient's wife Gwyndolyn Saxon called requesting labwork orders be sent to [facility] in [place].   Gwyndolyn Saxon is also requesting a return call to let her know if Ray needs to fast for his labwork.  Please call her cell [phone]

## 2019-06-30 NOTE — Telephone Encounter (Signed)
Patient's wife advised he does not need to fast for labs. Lab orders released.

## 2019-06-30 NOTE — Progress Notes (Signed)
Office Visit Note  Patient: Dennis Moon             Date of Birth: July 28, 1945           MRN: [id number]             PCP: [name], MD Referring: [name],* Visit Date: 07/08/2019 Occupation: @GUAROCC @  Subjective:  Medication management.   History of Present Illness: Dennis Moon is a 74 y.o. male with history of subcutaneous lupus, osteoarthritis, gout and Sjogren's syndrome.  He states he continues to have some sicca symptoms which are manageable with over-the-counter medications.  He has not had any gout flare in a long time.  He has reduced his allopurinol to 300 mg p.o. daily.  He has some stiffness in his hands and lower back due to arthritis.  He has not noticed any rash from subcutaneous lupus.  He has been using sunscreen.  He states his dermatologist has retired and he would like to establish with a new dermatologist.  Activities of Daily Living:  Patient reports morning stiffness for several hours.   Patient Denies nocturnal pain.  Difficulty dressing/grooming: Denies Difficulty climbing stairs: Denies Difficulty getting out of chair: Denies Difficulty using hands for taps, buttons, cutlery, and/or writing: Denies  Review of Systems  Constitutional: Positive for fatigue. Negative for night sweats.  HENT: Positive for mouth dryness and nose dryness. Negative for mouth sores.   Eyes: Positive for dryness. Negative for redness.  Respiratory: Negative for shortness of breath and difficulty breathing.   Cardiovascular: Negative for chest pain, palpitations, hypertension, irregular heartbeat and swelling in legs/feet.  Gastrointestinal: Positive for constipation. Negative for blood in stool and diarrhea.  Endocrine: Negative for increased urination.  Genitourinary: Negative for difficulty urinating and painful urination.  Musculoskeletal: Positive for arthralgias, joint pain and morning stiffness. Negative for joint swelling, myalgias,  muscle weakness, muscle tenderness and myalgias.  Skin: Negative for color change, rash, hair loss, nodules/bumps, skin tightness, ulcers and sensitivity to sunlight.  Allergic/Immunologic: Negative for susceptible to infections.  Neurological: Negative for dizziness, fainting, headaches, memory loss, night sweats and weakness.  Hematological: Negative for bruising/bleeding tendency and swollen glands.  Psychiatric/Behavioral: Negative for depressed mood, confusion and sleep disturbance. The patient is not nervous/anxious.     PMFS History:  Patient Active Problem List   Diagnosis Date Noted  . Nonintractable headache 06/24/2019  . Androgen deficiency 11/17/2017  . Hypothyroidism 11/17/2017  . Cyst of tonsil 06/11/2017  . Obesity (BMI 30.0-34.9) 03/12/2017  . CAD S/P percutaneous coronary angioplasty 12/13/2016  . NSTEMI (non-ST elevated myocardial infarction) ([place]) 12/11/2016  . Gout of multiple sites 08/26/2016  . Chronic renal impairment 08/26/2016  . Chronic kidney disease 08/26/2016  . Sjogren's syndrome ([place]) 04/23/2016  . 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 Idiopathic chronic gout, unspecified site, without tophus (tophi) 04/23/2016  . High risk medication use 04/23/2016  . Primary osteoarthritis of both hands 04/23/2016  . DDD (degenerative disc disease), lumbar 04/23/2016  . Kidney disease 04/23/2016    Past Medical History:  Diagnosis Date  . CAD S/P percutaneous coronary angioplasty 12/12/2016   a. 11/2016: NSTEMI with DES to LAD and DES to LCx  . Gout   . Hypertension   . Hypothyroidism   . Migraine   . Sjogren's disease ([place])   . Systemic lupus erythematosus (H[facility])     Family History  Problem Relation Age of Onset  . Diabetes Mother   . Hypertension Mother   .  Hypertension Father   . Healthy Son    Past Surgical History:  Procedure Laterality Date  . CATARACT EXTRACTION, BILATERAL Bilateral 2017  . CORONARY STENT INTERVENTION N/A 12/12/2016   Procedure:  Coronary Stent Intervention;  Surgeon: Martinique, Peter M, MD;  Location: [facility];  Service: Cardiovascular;  Laterality: N/A;  . HERNIA REPAIR    . LEFT HEART CATH AND CORONARY ANGIOGRAPHY N/A 12/12/2016   Procedure: Left Heart Cath and Coronary Angiography;  Surgeon: Martinique, Peter M, MD;  Location: [facility];  Service: Cardiovascular;  Laterality: N/A;  . THYROID SURGERY  1997   Social History   Social History Narrative  . Not on file   Immunization History  Administered Date(s) Administered  . Influenza-Unspecified 01/17/2017  . Pneumococcal Conjugate-13 10/21/2013  . Pneumococcal Polysaccharide-23 08/02/2008, 03/14/2016  . Tdap 07/30/2007  . Zoster 08/28/2006  . Zoster Recombinat (Shingrix) 11/12/2017     Objective: Vital Signs: BP 111/66 (BP Location: Left Arm, Patient Position: Sitting, Cuff Size: Normal)   Pulse 62   Resp 15   Ht 6' (1.829 m)   Wt 241 lb 3.2 oz (109.4 kg)   BMI 32.71 kg/m    Physical Exam Vitals and nursing note reviewed.  Constitutional:      Appearance: He is well-developed.  HENT:     Head: Normocephalic and atraumatic.  Eyes:     Conjunctiva/sclera: Conjunctivae normal.     Pupils: Pupils are equal, round, and reactive to light.  Cardiovascular:     Rate and Rhythm: Normal rate and regular rhythm.     Heart sounds: Normal heart sounds.  Pulmonary:     Effort: Pulmonary effort is normal.     Breath sounds: Normal breath sounds.  Abdominal:     General: Bowel sounds are normal.     Palpations: Abdomen is soft.  Musculoskeletal:     Cervical back: Normal range of motion and neck supple.  Skin:    General: Skin is warm and dry.     Capillary Refill: Capillary refill takes less than 2 seconds.  Neurological:     Mental Status: He is alert and oriented to person, place, and time.  Psychiatric:        Behavior: Behavior normal.      Musculoskeletal Exam: C-spine was in good range of motion.  He has some postural  kyphosis.  He has discomfort range of motion of his lumbar spine.  Shoulder joints, elbow joints, wrist joints with good range of motion.  He had bilateral PIP and DIP thickening consistent with osteoarthritis.  Good range of motion of his hip joints, knee joints and ankles.  No MTP tenderness was noted.  CDAI Exam: CDAI Score: -- Patient Global: --; Provider Global: -- Swollen: --; Tender: -- Joint Exam 07/08/2019   No joint exam has been documented for this visit   There is currently no information documented on the homunculus. Go to the Rheumatology activity and complete the homunculus joint exam.  Investigation: No additional findings.  Imaging: No results found.  Recent Labs: Lab Results  Component Value Date   WBC 6.3 07/01/2019   HGB 13.5 07/01/2019   PLT 240 07/01/2019   NA 143 07/01/2019   K 4.2 07/01/2019   CL 100 07/01/2019   CO2 28 07/01/2019   GLUCOSE 101 (H) 07/01/2019   BUN 20 07/01/2019   CREATININE 1.35 (H) 07/01/2019   BILITOT 0.2 07/01/2019   ALKPHOS 87 07/01/2019   AST 31 07/01/2019  ALT 36 07/01/2019   PROT 7.1 07/01/2019   ALBUMIN 4.3 07/01/2019   CALCIUM 9.4 07/01/2019   GFRAA 59 (L) 07/01/2019    Speciality Comments: PLQ eye exam normal on 05/07/18  @ [facility].  Procedures:  No procedures performed Allergies: Bee venom   Assessment / Plan:     Visit Diagnoses: Subacute cutaneous lupus erythematosus-he has not noticed any recent rash.  He has been using sunscreen.  His dermatologist has retired.  I will make a dermatology referral for him per his request.  High risk medication use - d/c PLQ due to abnormal eye exam 11/2018.  He has been followed by Dr. Baird Cancer.  Idiopathic chronic gout of multiple sites without tophus - allopurinol 300 mg 1 tablet by mouth daily.  His uric acid is in desirable range.  We will monitor labs every 6 months.  Sjogren's syndrome with other organ involvement ([place]) - Positive ANA, positive Ro, positive  La.  He continues to have sicca symptoms.  He has been using over-the-counter products.  We discussed possible use of pilocarpine.  I have advised him to discuss this further with the ophthalmologist before we can consider the medication.  Primary osteoarthritis of both hands-he continues to have some stiffness but no swelling was noted.  DDD (degenerative disc disease), lumbar-he continues to have some lower back pain.  Core muscle strengthening exercises were emphasized.  CAD S/P percutaneous coronary angioplasty  History of chronic kidney disease  Orders: Orders Placed This Encounter  Procedures  . Ambulatory referral to Dermatology   No orders of the defined types were placed in this encounter.    Follow-Up Instructions: Return in about 6 months (around 01/05/2020) for Osteoarthritis, Gout.   Bo Merino, MD  Note - This record has been created using Editor, commissioning.  Chart creation errors have been sought, but may not always  have been located. Such creation errors do not reflect on  the standard of medical care.

## 2019-07-01 DIAGNOSIS — Z79899 Other long term (current) drug therapy: Secondary | ICD-10-CM | POA: Diagnosis not present

## 2019-07-01 DIAGNOSIS — M1A09X0 Idiopathic chronic gout, multiple sites, without tophus (tophi): Secondary | ICD-10-CM | POA: Diagnosis not present

## 2019-07-02 LAB — CBC WITH DIFFERENTIAL/PLATELET
Basophils Absolute: 0 10*3/uL (ref 0.0–0.2)
Basos: 1 %
EOS (ABSOLUTE): 0.2 10*3/uL (ref 0.0–0.4)
Eos: 3 %
Hematocrit: 40.2 % (ref 37.5–51.0)
Hemoglobin: 13.5 g/dL (ref 13.0–17.7)
Immature Grans (Abs): 0 10*3/uL (ref 0.0–0.1)
Immature Granulocytes: 0 %
Lymphocytes Absolute: 1.3 10*3/uL (ref 0.7–3.1)
Lymphs: 21 %
MCH: 31 pg (ref 26.6–33.0)
MCHC: 33.6 g/dL (ref 31.5–35.7)
MCV: 92 fL (ref 79–97)
Monocytes Absolute: 0.7 10*3/uL (ref 0.1–0.9)
Monocytes: 11 %
Neutrophils Absolute: 4.1 10*3/uL (ref 1.4–7.0)
Neutrophils: 64 %
Platelets: 240 10*3/uL (ref 150–450)
RBC: 4.35 x10E6/uL (ref 4.14–5.80)
RDW: 13.1 % (ref 11.6–15.4)
WBC: 6.3 10*3/uL (ref 3.4–10.8)

## 2019-07-02 LAB — CMP14+EGFR
ALT: 36 IU/L (ref 0–44)
AST: 31 IU/L (ref 0–40)
Albumin/Globulin Ratio: 1.5 (ref 1.2–2.2)
Albumin: 4.3 g/dL (ref 3.7–4.7)
Alkaline Phosphatase: 87 IU/L (ref 39–117)
BUN/Creatinine Ratio: 15 (ref 10–24)
BUN: 20 mg/dL (ref 8–27)
Bilirubin Total: 0.2 mg/dL (ref 0.0–1.2)
CO2: 28 mmol/L (ref 20–29)
Calcium: 9.4 mg/dL (ref 8.6–10.2)
Chloride: 100 mmol/L (ref 96–106)
Creatinine, Ser: 1.35 mg/dL — ABNORMAL HIGH (ref 0.76–1.27)
GFR calc Af Amer: 59 mL/min/{1.73_m2} — ABNORMAL LOW (ref >59)
GFR calc non Af Amer: 51 mL/min/{1.73_m2} — ABNORMAL LOW (ref >59)
Globulin, Total: 2.8 g/dL (ref 1.5–4.5)
Glucose: 101 mg/dL — ABNORMAL HIGH (ref 65–99)
Potassium: 4.2 mmol/L (ref 3.5–5.2)
Sodium: 143 mmol/L (ref 134–144)
Total Protein: 7.1 g/dL (ref 6.0–8.5)

## 2019-07-02 LAB — URIC ACID: Uric Acid: 5.5 mg/dL (ref 3.8–8.4)

## 2019-07-04 NOTE — Telephone Encounter (Signed)
CBC is normal, GFR is low but is stable, uric acid is in desirable range.

## 2019-07-05 ENCOUNTER — Telehealth: Payer: Self-pay | Admitting: Cardiology

## 2019-07-05 NOTE — Telephone Encounter (Signed)
Spoke with pt who report this morning he experienced some chest heaviness for about 10 minutes. He too 1 nitro and symptoms resolved. He denies any other symptoms.  Appointment scheduled for tomorrow 2/17 with Jory Sims. DNP for further evaluations. Pt voiced understanding to seek urgent care if symptoms reoccur.

## 2019-07-05 NOTE — Telephone Encounter (Signed)
Left message to call back  

## 2019-07-05 NOTE — Telephone Encounter (Signed)
Pt c/o of Chest Pain: STAT if CP now or developed within 24 hours  1. Are you having CP right now? No  2. Are you experiencing any other symptoms (ex. SOB, nausea, vomiting, sweating)? No  3. How long have you been experiencing CP? Patient states he only experienced pain and tightness for about 10 minutes, prior to taking Nitroglycerin.  4. Is your CP continuous or coming and going? Continuous  5. Have you taken Nitroglycerin? Yes. Patient states that the Nitroglycerin has caused chest pain and tightness to subside completely.  ?

## 2019-07-05 NOTE — Telephone Encounter (Signed)
Patient returned call

## 2019-07-06 ENCOUNTER — Encounter: Payer: Self-pay | Admitting: Adult Health

## 2019-07-06 ENCOUNTER — Other Ambulatory Visit: Payer: Self-pay

## 2019-07-06 ENCOUNTER — Ambulatory Visit (INDEPENDENT_AMBULATORY_CARE_PROVIDER_SITE_OTHER): Payer: Medicare Other | Admitting: Adult Health

## 2019-07-06 VITALS — BP 130/81 | HR 81 | Ht 72.0 in | Wt 240.2 lb

## 2019-07-06 DIAGNOSIS — E039 Hypothyroidism, unspecified: Secondary | ICD-10-CM

## 2019-07-06 DIAGNOSIS — K219 Gastro-esophageal reflux disease without esophagitis: Secondary | ICD-10-CM

## 2019-07-06 DIAGNOSIS — I2510 Atherosclerotic heart disease of native coronary artery without angina pectoris: Secondary | ICD-10-CM | POA: Diagnosis not present

## 2019-07-06 DIAGNOSIS — E7800 Pure hypercholesterolemia, unspecified: Secondary | ICD-10-CM | POA: Diagnosis not present

## 2019-07-06 DIAGNOSIS — I200 Unstable angina: Secondary | ICD-10-CM

## 2019-07-06 MED ORDER — NITROGLYCERIN 0.4 MG SL SUBL: 0.4000 mg | SUBLINGUAL_TABLET | SUBLINGUAL | 2 refills | Status: DC | PRN

## 2019-07-06 NOTE — Patient Instructions (Signed)
Medication Instructions:  Continue current medications *If you need a refill on your cardiac medications before your next appointment, please call your pharmacy*  Lab Work: None Ordered  Testing/Procedures: Your physician has requested that you have a lexiscan myoview. For further information please visit [email or web address]. Please follow instruction sheet, as given.  Follow-Up: At [facility], you and your health needs are our priority.  As part of our continuing mission to provide you with exceptional heart care, we have created designated Provider Care Teams.  These Care Teams include your primary Cardiologist (physician) and Advanced Practice Providers (APPs -  Physician Assistants and Nurse Practitioners) who all work together to provide you with the care you need, when you need it.  Your next appointment:   1 month(s)  The format for your next appointment:   In Person  Provider:   [name], MD

## 2019-07-06 NOTE — Progress Notes (Signed)
Cardiology Office Note   Date:  07/06/2019   ID:  Dennis Moon, DOB [birth year], MRN [id number]  PCP:  [name], MD  Cardiologist: Dr. Martinique  No chief complaint on file.    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  The former is being followed by rheumatology.  He has a history of non-STEMI in June 2018, cardiac catheterization at that time showed two-vessel obstructive CAD with 90% proximal LAD stenosis and 80% proximal left circumflex lesion.  The patient underwent PCI with DES placement to both areas.  He was started on aspirin and Brilinta.  Was last seen in the office on 06/03/2019, he continued to work as a Pharmacist, community.  He denied any recent chest pain or shortness of breath.  LDL was reviewed and found to be low at 40.  He was to continue observation concerning this.  He will need repeat fasting lipids and LFTs in June 2021.  He called our office on 07/05/2019 with complaints of chest pain.  No associated dyspnea or diaphoresis. He took a NTG with resolution of symptoms. He requested appointment today.  He states he was sitting at his desk had just finished eating some cereal and had sudden onset of pressure in his chest.  He got up and walked around, trying to burp, which did not help.  He knows he has a history of hiatal hernia and he felt that might be related however the pressure intensified and he took a nitroglycerin which relieved his symptoms.  Due to his history he has become concerned.  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  Past Medical History:  Diagnosis Date  . CAD S/P percutaneous coronary angioplasty 12/12/2016   a. 11/2016: NSTEMI with DES to LAD and DES to LCx  . Gout   . Hypertension   .  Hypothyroidism   . Migraine   . Sjogren's disease ([place])   . Systemic lupus erythematosus ([place])     Past Surgical History:  Procedure Laterality Date  . CATARACT EXTRACTION, BILATERAL Bilateral 2017  . CORONARY STENT INTERVENTION N/A 12/12/2016   Procedure: Coronary Stent Intervention;  Surgeon: Martinique, Peter M, MD;  Location: [facility];  Service: Cardiovascular;  Laterality: N/A;  . HERNIA REPAIR    . LEFT HEART CATH AND CORONARY ANGIOGRAPHY N/A 12/12/2016   Procedure: Left Heart Cath and Coronary Angiography;  Surgeon: Martinique, Peter M, MD;  Location: [facility];  Service: Cardiovascular;  Laterality: N/A;  . THYROID SURGERY  1997     Current Outpatient Medications  Medication Sig Dispense Refill  . acyclovir ointment (ZOVIRAX) 5 % Apply 1 application topically as needed. 15 g 0  . allopurinol (ZYLOPRIM) 300 MG tablet TAKE 1 AND 1/2 TABLETS BY MOUTH EVERY DAY 135 tablet 0  . Ascorbic Acid (VITAMIN C) 1000 MG tablet Take 1,000 mg by mouth daily.    [name] aspirin EC 81 MG EC tablet Take 1 tablet (81 mg total) by mouth daily.    [name] aspirin-acetaminophen-caffeine (EXCEDRIN EXTRA STRENGTH) 250-250-65 MG tablet Take by mouth every 6 (six) hours as needed for headache.    [name] atorvastatin (LIPITOR) 80 MG tablet Take 1 tablet (80 mg total) by mouth daily at 6 PM. 90 tablet 3  . Calcium Carb-Cholecalciferol (  CALCIUM 600 + D PO) Take 1 tablet by mouth daily.    [name] CRANBERRY EXTRACT PO Take 1 tablet by mouth daily.    . DULoxetine (CYMBALTA) 60 MG capsule TAKE 1 CAPSULE BY MOUTH DAILY 30 capsule 0  . esomeprazole (NEXIUM 24HR) 20 MG capsule Takes 2 capsules ( 40 mg ) every night 60 capsule 6  . levothyroxine (SYNTHROID) 100 MCG tablet TAKE 1 TABLET(100 MCG) BY MOUTH AT BEDTIME 90 tablet 2  . Multiple Vitamins-Minerals (MULTIVITAMIN PO) Take 1 tablet by mouth daily.    . nitroGLYCERIN (NITROSTAT) 0.4 MG SL tablet Place 1 tablet (0.4 mg total) under the tongue every 5 (five) minutes as  needed for chest pain. 25 tablet 2  . Probiotic Product (PROBIOTIC PO) Take 1 capsule by mouth daily.    . propranolol (INDERAL) 60 MG tablet TAKE 1 TABLET(60 MG) BY MOUTH TWICE DAILY 180 tablet 4  . testosterone (ANDROGEL) 50 MG/5GM (1%) GEL APPLY 5 GRAMS(1 PACKET) ONTO THE SKIN TWICE DAILY 300 g 4   No current facility-administered medications for this visit.    Allergies:   Bee venom    Social History:  The patient  reports that he quit smoking about 53 years ago. He has a 6.00 pack-year smoking history. He has never used smokeless tobacco. He reports current alcohol use of about 7.0 standard drinks of alcohol per week. He reports that he does not use drugs.   Family History:  The patient's family history includes Diabetes in his mother; Healthy in his son; Hypertension in his father and mother.    ROS: All other systems are reviewed and negative. Unless otherwise mentioned in H&P    PHYSICAL EXAM: VS:  BP 130/81   Pulse 81   Ht 6' (1.829 m)   Wt 240 lb 3.2 oz (109 kg)   SpO2 94%   BMI 32.58 kg/m  , BMI Body mass index is 32.58 kg/m. GEN: Well nourished, well developed, in no acute distress HEENT: normal Neck: no JVD, carotid bruits, or masses Cardiac: RRR; no murmurs, rubs, or gallops,no edema  Respiratory:  Clear to auscultation bilaterally, normal work of breathing GI: soft, nontender, nondistended, + BS MS: no deformity or atrophy Skin: warm and dry, no rash Neuro:  Strength and sensation are intact Psych: euthymic mood, full affect   EKG: Personally reviewed ;normal sinus rhythm, ventricular rate of 81 bpm.  Recent Labs: 06/24/2019: TSH 3.39 07/01/2019: ALT 36; BUN 20; Creatinine, Ser 1.35; Hemoglobin 13.5; Platelets 240; Potassium 4.2; Sodium 143    Lipid Panel    Component Value Date/Time   CHOL 119 06/17/2019 0930   TRIG 111 06/17/2019 0930   HDL 41 06/17/2019 0930   CHOLHDL 2.9 06/17/2019 0930   CHOLHDL 4.2 12/12/2016 0330   VLDL 33 12/12/2016 0330    LDLCALC 58 06/17/2019 0930      Wt Readings from Last 3 Encounters:  07/06/19 240 lb 3.2 oz (109 kg)  06/24/19 237 lb 12.8 oz (107.9 kg)  06/03/19 239 lb (108.4 kg)      Other studies Reviewed: LHC 12/12/2016  Mid LAD lesion, 40 %stenosed.  The left ventricular systolic function is normal.  LV end diastolic pressure is normal.  The left ventricular ejection fraction is 55-65% by visual estimate.  Prox LAD lesion, 90 %stenosed.  A STENT PROMUS PREM MR 4.0X20 drug eluting stent was successfully placed.  Post intervention, there is a 0% residual stenosis.  Ost Cx to Prox Cx lesion, 80 %stenosed.  A STENT XIENCE ALPINE RX 3.0X18 drug eluting stent was successfully placed.  Post intervention, there is a 0% residual stenosis.   1. 2 vessel obstructive CAD 2. Normal LV function 3. Normal LVEDP 4. Successful stenting of the proximal LAD with DES 5. Successful stenting of the proximal LCx with DES.   ASSESSMENT AND PLAN:  1.  Coronary artery disease: Two-vessel coronary artery disease requiring stent to the proximal LAD (90% lesion) and ostial circumflex (80% stenosis).  He has been medically compliant.  He states she has felt fine up until today when he had sudden onset of chest pressure.  Uncertain if this was related to eating however getting up walking around trying to burp did not relieve.  Only nitroglycerin allow his symptoms to resolve.  I will plan Lexiscan nuclear medicine stress test to evaluate for new areas of ischemia with known residual disease in the mid LAD which was 40% stenosed.  Do not feel that his symptoms warrant proceeding to cardiac catheterization unless there are recurrent and more intense.  We will want to evaluate for ISR.  I have advised him to refill his prescription for nitroglycerin as the nitroglycerin vial that he has is 66-year-old.  I have explained the Lexiscan Myoview stress test to him he verbalizes understanding and is willing to proceed.   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  2.  Hyperlipidemia: Continue atorvastatin 80 mg daily.  Goal of LDL less than 70.  Labs are followed by his primary care physician Dr. Ethelene Hal.   3, Hypothyroidism: Continue levothyroxine 100 mcg daily as directed.  4.  GERD: Continue PPI as directed.   Current medicines are reviewed at length with the patient today.  I have spent 30 minutes dedicated to the care of this patient on the date of this encounter to include pre-visit review of records, assessment, management and diagnostic testing,with shared decision making.  Labs/ tests ordered today include: Lexiscan Myoview.   Dennis Moon. Dennis Moon, ANP, AACC   07/06/2019 4:49 PM    [facility] [place] [address] Office [phone] Fax [phone]  Notice: This dictation was prepared with Dragon dictation along with smaller phrase technology. Any transcriptional errors that result from this process are unintentional and may not be corrected upon review.

## 2019-07-08 ENCOUNTER — Ambulatory Visit: Payer: Medicare Other | Admitting: Rheumatology

## 2019-07-08 ENCOUNTER — Other Ambulatory Visit: Payer: Self-pay

## 2019-07-08 ENCOUNTER — Encounter: Payer: Self-pay | Admitting: Physician Assistant

## 2019-07-08 VITALS — BP 111/66 | HR 62 | Resp 15 | Ht 72.0 in | Wt 241.2 lb

## 2019-07-08 DIAGNOSIS — M1A09X0 Idiopathic chronic gout, multiple sites, without tophus (tophi): Secondary | ICD-10-CM | POA: Diagnosis not present

## 2019-07-08 DIAGNOSIS — M5136 Other intervertebral disc degeneration, lumbar region: Secondary | ICD-10-CM

## 2019-07-08 DIAGNOSIS — Z79899 Other long term (current) drug therapy: Secondary | ICD-10-CM | POA: Diagnosis not present

## 2019-07-08 DIAGNOSIS — L931 Subacute cutaneous lupus erythematosus: Secondary | ICD-10-CM | POA: Diagnosis not present

## 2019-07-08 DIAGNOSIS — M19042 Primary osteoarthritis, left hand: Secondary | ICD-10-CM

## 2019-07-08 DIAGNOSIS — M19041 Primary osteoarthritis, right hand: Secondary | ICD-10-CM | POA: Diagnosis not present

## 2019-07-08 DIAGNOSIS — I2510 Atherosclerotic heart disease of native coronary artery without angina pectoris: Secondary | ICD-10-CM

## 2019-07-08 DIAGNOSIS — Z9861 Coronary angioplasty status: Secondary | ICD-10-CM

## 2019-07-08 DIAGNOSIS — M3509 Sicca syndrome with other organ involvement: Secondary | ICD-10-CM

## 2019-07-08 DIAGNOSIS — Z87448 Personal history of other diseases of urinary system: Secondary | ICD-10-CM

## 2019-07-08 MED ORDER — ALLOPURINOL 300 MG PO TABS: 300.0000 mg | ORAL_TABLET | Freq: Every day | ORAL | 0 refills | Status: DC

## 2019-07-12 ENCOUNTER — Telehealth: Payer: Self-pay | Admitting: Rheumatology

## 2019-07-12 NOTE — Telephone Encounter (Signed)
Faxed demographics to [facility].

## 2019-07-12 NOTE — Telephone Encounter (Signed)
[facility] called requesting patient's demographics information faxed to their office so they can schedule an appointment.  Fax [phone]

## 2019-07-20 ENCOUNTER — Telehealth (HOSPITAL_COMMUNITY): Payer: Self-pay

## 2019-07-20 NOTE — Telephone Encounter (Signed)
Encounter complete. 

## 2019-07-21 ENCOUNTER — Telehealth (HOSPITAL_COMMUNITY): Payer: Self-pay

## 2019-07-21 NOTE — Telephone Encounter (Signed)
Encounter complete. 

## 2019-07-22 ENCOUNTER — Ambulatory Visit (HOSPITAL_COMMUNITY)
Admission: RE | Admit: 2019-07-22 | Discharge: 2019-07-22 | Disposition: A | Discharge status: Home / Self Care | Payer: Medicare Other | Source: Ambulatory Visit | Attending: Cardiovascular Disease | Admitting: Cardiovascular Disease

## 2019-07-22 ENCOUNTER — Other Ambulatory Visit: Payer: Self-pay

## 2019-07-22 ENCOUNTER — Encounter: Payer: Self-pay | Admitting: Cardiology

## 2019-07-22 DIAGNOSIS — I200 Unstable angina: Secondary | ICD-10-CM

## 2019-07-22 DIAGNOSIS — I2510 Atherosclerotic heart disease of native coronary artery without angina pectoris: Secondary | ICD-10-CM

## 2019-07-22 NOTE — Progress Notes (Deleted)
Cardiology Office Note   Date:  07/22/2019   ID:  Dennis Moon, DOB Sep 30, 1945, MRN [id number]  PCP:  [name], MD  Cardiologist: Dr. Martinique  No chief complaint on file.    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 He has a history of Sojourn's disease, subacute cutaneous lupus erythematous, gout, and stage III chronic kidney disease.  The former is being followed by rheumatology.  He has a history of non-STEMI in June 2018, cardiac catheterization at that time showed two-vessel obstructive CAD with 90% proximal LAD stenosis and 80% proximal left circumflex lesion.  The patient underwent PCI with DES placement to both areas.  He was started on aspirin and Brilinta.  He was seen in our office on 07/06/2019 with complaints of chest pain.  No associated dyspnea or diaphoresis. He took a NTG with resolution of symptoms.   He states he was sitting at his desk had just finished eating some cereal and had sudden onset of pressure in his chest.  He got up and walked around, trying to burp, which did not help.  He knows he has a history of hiatal hernia and he felt that might be related however the pressure intensified and he took a nitroglycerin which relieved his symptoms.    He was scheduled for a Myoview study which showed...   Past Medical History:  Diagnosis Date  . CAD S/P percutaneous coronary angioplasty 12/12/2016   a. 11/2016: NSTEMI with DES to LAD and DES to LCx  . Gout   . Hypertension   . Hypothyroidism   . Migraine   . Sjogren's disease ([place])   . Systemic lupus erythematosus ([place])     Past Surgical History:  Procedure Laterality Date  . CATARACT EXTRACTION, BILATERAL Bilateral 2017  . CORONARY STENT INTERVENTION N/A 12/12/2016   Procedure: Coronary Stent Intervention;  Surgeon: Martinique, Debbi Strandberg M, MD;  Location: [facility];  Service: Cardiovascular;  Laterality: N/A;  . HERNIA  REPAIR    . LEFT HEART CATH AND CORONARY ANGIOGRAPHY N/A 12/12/2016   Procedure: Left Heart Cath and Coronary Angiography;  Surgeon: Martinique, Serafina Topham M, MD;  Location: [facility];  Service: Cardiovascular;  Laterality: N/A;  . THYROID SURGERY  1997     Current Outpatient Medications  Medication Sig Dispense Refill  . acyclovir ointment (ZOVIRAX) 5 % Apply 1 application topically as needed. 15 g 0  . allopurinol (ZYLOPRIM) 300 MG tablet Take 1 tablet (300 mg total) by mouth daily. 90 tablet 0  . Ascorbic Acid (VITAMIN C) 1000 MG tablet Take 1,000 mg by mouth daily.    [name] aspirin-acetaminophen-caffeine (EXCEDRIN EXTRA STRENGTH) 250-250-65 MG tablet Take by mouth every 6 (six) hours as needed for headache.    [name] atorvastatin (LIPITOR) 80 MG tablet Take 1 tablet (80 mg total) by mouth daily at 6 PM. 90 tablet 3  . Calcium Carb-Cholecalciferol (CALCIUM 600 + D PO) Take 1 tablet by mouth daily.    [name] CRANBERRY EXTRACT PO Take 1 tablet by mouth daily.    . DULoxetine (CYMBALTA) 60 MG capsule TAKE 1 CAPSULE BY MOUTH DAILY 30 capsule 0  . esomeprazole (NEXIUM 24HR) 20 MG capsule Takes 2 capsules ( 40 mg ) every night 60 capsule 6  . levothyroxine (SYNTHROID) 100 MCG tablet TAKE 1 TABLET(100 MCG) BY MOUTH AT BEDTIME 90 tablet 2  . Multiple Vitamins-Minerals (MULTIVITAMIN PO)  Take 1 tablet by mouth daily.    . nitroGLYCERIN (NITROSTAT) 0.4 MG SL tablet Place 1 tablet (0.4 mg total) under the tongue every 5 (five) minutes as needed for chest pain. 25 tablet 2  . Probiotic Product (PROBIOTIC PO) Take 1 capsule by mouth daily.    . propranolol (INDERAL) 60 MG tablet TAKE 1 TABLET(60 MG) BY MOUTH TWICE DAILY 180 tablet 4  . testosterone (ANDROGEL) 50 MG/5GM (1%) GEL APPLY 5 GRAMS(1 PACKET) ONTO THE SKIN TWICE DAILY 300 g 4   No current facility-administered medications for this visit.    Allergies:   Bee venom    Social History:  The patient  reports that he quit smoking about 53 years ago. He has a  6.00 pack-year smoking history. He has never used smokeless tobacco. He reports current alcohol use of about 7.0 standard drinks of alcohol per week. He reports that he does not use drugs.   Family History:  The patient's family history includes Diabetes in his mother; Healthy in his son; Hypertension in his father and mother.    ROS: All other systems are reviewed and negative. Unless otherwise mentioned in H&P    PHYSICAL EXAM: VS:  There were no vitals taken for this visit. , BMI There is no height or weight on file to calculate BMI. GEN: Well nourished, well developed, in no acute distress HEENT: normal Neck: no JVD, carotid bruits, or masses Cardiac: RRR; no murmurs, rubs, or gallops,no edema  Respiratory:  Clear to auscultation bilaterally, normal work of breathing GI: soft, nontender, nondistended, + BS MS: no deformity or atrophy Skin: warm and dry, no rash Neuro:  Strength and sensation are intact Psych: euthymic mood, full affect   EKG: Personally reviewed ;normal sinus rhythm, ventricular rate of 81 bpm.  Recent Labs: 06/24/2019: TSH 3.39 07/01/2019: ALT 36; BUN 20; Creatinine, Ser 1.35; Hemoglobin 13.5; Platelets 240; Potassium 4.2; Sodium 143    Lipid Panel    Component Value Date/Time   CHOL 119 06/17/2019 0930   TRIG 111 06/17/2019 0930   HDL 41 06/17/2019 0930   CHOLHDL 2.9 06/17/2019 0930   CHOLHDL 4.2 12/12/2016 0330   VLDL 33 12/12/2016 0330   LDLCALC 58 06/17/2019 0930      Wt Readings from Last 3 Encounters:  07/08/19 241 lb 3.2 oz (109.4 kg)  07/06/19 240 lb 3.2 oz (109 kg)  06/24/19 237 lb 12.8 oz (107.9 kg)      Other studies Reviewed: LHC 12/12/2016  Mid LAD lesion, 40 %stenosed.  The left ventricular systolic function is normal.  LV end diastolic pressure is normal.  The left ventricular ejection fraction is 55-65% by visual estimate.  Prox LAD lesion, 90 %stenosed.  A STENT PROMUS PREM MR 4.0X20 drug eluting stent was successfully  placed.  Post intervention, there is a 0% residual stenosis.  Ost Cx to Prox Cx lesion, 80 %stenosed.  A STENT XIENCE ALPINE RX 3.0X18 drug eluting stent was successfully placed.  Post intervention, there is a 0% residual stenosis.   1. 2 vessel obstructive CAD 2. Normal LV function 3. Normal LVEDP 4. Successful stenting of the proximal LAD with DES 5. Successful stenting of the proximal LCx with DES.   ASSESSMENT AND PLAN:  1.  Coronary artery disease: Two-vessel coronary artery disease requiring stent to the proximal LAD (90% lesion) and ostial circumflex (80% stenosis) in 2018.  He has been medically compliant.  He states she has felt fine up until today when he had  sudden onset of chest pressure.  Uncertain if this was related to eating however getting up walking around trying to burp did not relieve.  Only nitroglycerin allow his symptoms to resolve.  2.  Hyperlipidemia: Continue atorvastatin 80 mg daily.  Goal of LDL less than 70.  Labs are followed by his primary care physician Dr. Ethelene Hal.   3, Hypothyroidism: Continue levothyroxine 100 mcg daily as directed.  4.  GERD: Continue PPI as directed.  Kilie Rund Martinique MD, [facility]     07/22/2019 10:42 AM    [address] Office [phone] Fax [phone]

## 2019-07-25 ENCOUNTER — Ambulatory Visit: Payer: Medicare Other | Admitting: Cardiology

## 2019-07-26 ENCOUNTER — Telehealth (HOSPITAL_COMMUNITY): Payer: Self-pay

## 2019-07-26 NOTE — Telephone Encounter (Signed)
Encounter complete. 

## 2019-07-27 ENCOUNTER — Telehealth (HOSPITAL_COMMUNITY): Payer: Self-pay | Admitting: *Deleted

## 2019-07-27 ENCOUNTER — Encounter: Payer: Self-pay | Admitting: Physician Assistant

## 2019-07-27 NOTE — Telephone Encounter (Signed)
Close encounter 

## 2019-07-28 ENCOUNTER — Telehealth (HOSPITAL_COMMUNITY): Payer: Self-pay

## 2019-07-28 NOTE — Telephone Encounter (Signed)
Encounter complete. 

## 2019-07-29 ENCOUNTER — Ambulatory Visit (HOSPITAL_COMMUNITY)
Admission: RE | Admit: 2019-07-29 | Discharge: 2019-07-29 | Disposition: A | Discharge status: Home / Self Care | Payer: Medicare Other | Source: Ambulatory Visit | Attending: Cardiology | Admitting: Cardiology

## 2019-07-29 ENCOUNTER — Other Ambulatory Visit: Payer: Self-pay

## 2019-07-29 DIAGNOSIS — I200 Unstable angina: Secondary | ICD-10-CM | POA: Diagnosis not present

## 2019-07-29 DIAGNOSIS — I2510 Atherosclerotic heart disease of native coronary artery without angina pectoris: Secondary | ICD-10-CM | POA: Insufficient documentation

## 2019-07-29 LAB — MYOCARDIAL PERFUSION IMAGING
LV dias vol: 117 mL (ref 62–150)
LV sys vol: 54 mL
Peak HR: 86 {beats}/min
Rest HR: 54 {beats}/min
SDS: 0
SRS: 0
SSS: 0
TID: 0.92

## 2019-07-29 MED ORDER — REGADENOSON 0.4 MG/5ML IV SOLN
0.4000 mg | Freq: Once | INTRAVENOUS | Status: AC
  Administered 2019-07-29: 0.4 mg via INTRAVENOUS

## 2019-07-29 MED ORDER — TECHNETIUM TC 99M TETROFOSMIN IV KIT
10.7000 | PACK | Freq: Once | INTRAVENOUS | Status: AC | PRN
  Administered 2019-07-29: 10.7 via INTRAVENOUS
  Filled 2019-07-29: qty 11

## 2019-07-29 MED ORDER — TECHNETIUM TC 99M TETROFOSMIN IV KIT
30.2000 | PACK | Freq: Once | INTRAVENOUS | Status: AC | PRN
  Administered 2019-07-29: 30.2 via INTRAVENOUS
  Filled 2019-07-29: qty 31

## 2019-08-03 ENCOUNTER — Other Ambulatory Visit: Payer: Self-pay | Admitting: Family Medicine

## 2019-08-03 NOTE — Telephone Encounter (Signed)
Patient aware rx sent in  

## 2019-08-03 NOTE — Telephone Encounter (Signed)
Refill request pending for approval/denial last visit 06/24/19

## 2019-08-22 ENCOUNTER — Other Ambulatory Visit: Payer: Self-pay | Admitting: Rheumatology

## 2019-08-24 ENCOUNTER — Telehealth: Payer: Self-pay | Admitting: Rheumatology

## 2019-08-24 MED ORDER — ALLOPURINOL 300 MG PO TABS: 300.0000 mg | ORAL_TABLET | Freq: Every day | ORAL | 0 refills | Status: DC

## 2019-08-24 NOTE — Telephone Encounter (Signed)
Last Visit: 07/08/19 Next Visit: 01/06/20 Labs: 07/01/19 CBC is normal, GFR is low but is stable, uric acid is in desirable range.  Current Dose per office note on 07/08/19: allopurinol 300 mg 1 tablet by mouth daily.  Okay to refill per Dr. Estanislado Pandy

## 2019-08-24 NOTE — Telephone Encounter (Signed)
Patient called requesting prescription refill of Allopurinol to be sent to [facility] at [address] in [place].

## 2019-08-26 DIAGNOSIS — Z79899 Other long term (current) drug therapy: Secondary | ICD-10-CM | POA: Diagnosis not present

## 2019-08-30 NOTE — Progress Notes (Signed)
Cardiology Office Note    Date:  09/01/2019   ID:  Dennis Moon, DOB 03-Mar-1946, MRN [id number]  PCP:  [name], MD  Cardiologist: Dr. Martinique  Chief Complaint  Patient presents with  . Coronary Artery Disease    History of Present Illness:    Dennis Moon is a 74 y.o. male with past medical history of HTN, HLD, and Stage 3 CKD who is seen for follow up CAD. He is also followed by Rheumatology for gout, subacute cutaneous lupus erythematosus, Sjogren's, osteoarthritis, and DDD.   He was  admitted to [facility] from 7/26 - 12/13/2016 for evaluation of chest pain.   EKG was without acute changes but his troponin values peaked at 0.40. A cardiac catheterization was performed which showed 2-vessel obstructive CAD with 90% Prox-LAD stenosis and 80% Prox Cx stenosis. PCI was performed with placement of DES to both the LAD and LCx. He was started on DAPT with ASA and Brilinta along with BB and statin therapy.   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   He was seen in March after an episode of chest pain while eating cereal. A Myoview study was ordered and was normal. In retrospect he feels this was his reflux.   On follow up today he is doing very well. 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 He is walking some and has a recumbent bike but misses going to the gym.  No chest pain, dyspnea, palpitations, edema.    Past Medical History:  Diagnosis Date  . CAD S/P percutaneous coronary angioplasty 12/12/2016   a. 11/2016: NSTEMI with DES to LAD and DES to LCx  . Gout   . Hypertension   . Hypothyroidism   . Migraine   . Sjogren's disease ([place])   . Systemic lupus erythematosus ([place])     Past Surgical History:  Procedure Laterality Date  . CATARACT EXTRACTION, BILATERAL Bilateral 2017  . CORONARY STENT INTERVENTION N/A 12/12/2016   Procedure: Coronary Stent Intervention;  Surgeon: Martinique, Kendarrius Tanzi M, MD;  Location: [facility];  Service: Cardiovascular;   Laterality: N/A;  . HERNIA REPAIR    . LEFT HEART CATH AND CORONARY ANGIOGRAPHY N/A 12/12/2016   Procedure: Left Heart Cath and Coronary Angiography;  Surgeon: Martinique, Rhylei Mcquaig M, MD;  Location: [facility];  Service: Cardiovascular;  Laterality: N/A;  . THYROID SURGERY  1997    Current Medications: Outpatient Medications Prior to Visit  Medication Sig Dispense Refill  . acyclovir ointment (ZOVIRAX) 5 % Apply 1 application topically as needed. 15 g 0  . allopurinol (ZYLOPRIM) 300 MG tablet Take 1 tablet (300 mg total) by mouth daily. 90 tablet 0  . Ascorbic Acid (VITAMIN C) 1000 MG tablet Take 1,000 mg by mouth daily.    [name] aspirin-acetaminophen-caffeine (EXCEDRIN EXTRA STRENGTH) 250-250-65 MG tablet Take by mouth every 6 (six) hours as needed for headache.    [name] atorvastatin (LIPITOR) 80 MG tablet Take 1 tablet (80 mg total) by mouth daily at 6 PM. 90 tablet 3  . Calcium Carb-Cholecalciferol (CALCIUM 600 + D PO) Take 1 tablet by mouth daily.    [name] CRANBERRY EXTRACT PO Take 1 tablet by mouth daily.    . DULoxetine (CYMBALTA) 60 MG capsule TAKE 1 CAPSULE BY MOUTH DAILY 30 capsule 0  . esomeprazole (NEXIUM 24HR) 20 MG capsule Takes 2 capsules ( 40 mg ) every night 60 capsule 6  . levothyroxine (SYNTHROID) 100 MCG tablet TAKE 1  TABLET(100 MCG) BY MOUTH AT BEDTIME 90 tablet 2  . Multiple Vitamins-Minerals (MULTIVITAMIN PO) Take 1 tablet by mouth daily.    . nitroGLYCERIN (NITROSTAT) 0.4 MG SL tablet Place 1 tablet (0.4 mg total) under the tongue every 5 (five) minutes as needed for chest pain. 25 tablet 2  . Probiotic Product (PROBIOTIC PO) Take 1 capsule by mouth daily.    . propranolol (INDERAL) 60 MG tablet TAKE 1 TABLET(60 MG) BY MOUTH TWICE DAILY 180 tablet 4  . testosterone (ANDROGEL) 50 MG/5GM (1%) GEL APPLY 5 GRAMS(1 PACKET) ONTO THE SKIN TWICE DAILY 300 g 4   No facility-administered medications prior to visit.     Allergies:   Bee venom   Social History   Socioeconomic History   . Marital status: Married    Spouse name: Not on file  . Number of children: Not on file  . Years of education: Not on file  . Highest education level: Not on file  Occupational History  . Not on file  Tobacco Use  . Smoking status: Former Smoker    Packs/day: 1.00    Years: 6.00    Pack years: 6.00    Quit date: 04/25/1966    Years since quitting: 53.3  . Smokeless tobacco: Never Used  Substance and Sexual Activity  . Alcohol use: Yes    Alcohol/week: 7.0 standard drinks    Types: 7 Glasses of wine per week  . Drug use: Never  . Sexual activity: Not on file  Other Topics Concern  . Not on file  Social History Narrative  . Not on file   Social Determinants of Health   Financial Resource Strain:   . Difficulty of Paying Living Expenses:   Food Insecurity:   . Worried About Charity fundraiser in the Last Year:   . Arboriculturist in the Last Year:   Transportation Needs:   . Film/video editor (Medical):   [name] Lack of Transportation (Non-Medical):   Physical Activity:   . Days of Exercise per Week:   . Minutes of Exercise per Session:   Stress:   . Feeling of Stress :   Social Connections:   . Frequency of Communication with Friends and Family:   . Frequency of Social Gatherings with Friends and Family:   . Attends Religious Services:   . Active Member of Clubs or Organizations:   . Attends Archivist Meetings:   [name] Marital Status:      Family History:  The patient's family history includes Diabetes in his mother; Hypertension in his father and mother.   Review of Systems:   Please see the history of present illness.     All other systems reviewed and are otherwise negative except as noted above.   Physical Exam:    VS:  BP 132/84   Pulse 65   Ht 6' (1.829 m)   Wt 235 lb (106.6 kg)   SpO2 92%   BMI 31.87 kg/m    GENERAL:  Well appearing, overweight WM in NAD HEENT:  PERRL, EOMI, sclera are clear. Oropharynx is clear. NECK:  No jugular  venous distention, carotid upstroke brisk and symmetric, no bruits, no thyromegaly or adenopathy LUNGS:  Clear to auscultation bilaterally CHEST:  Unremarkable HEART:  RRR,  PMI not displaced or sustained,S1 and S2 within normal limits, no S3, no S4: no clicks, no rubs, no murmurs ABD:  Soft, nontender. BS +, no masses or bruits. No hepatomegaly, no splenomegaly  EXT:  2 + pulses throughout, no edema, no cyanosis no clubbing SKIN:  Warm and dry.  No rashes NEURO:  Alert and oriented x 3. Cranial nerves II through XII intact. PSYCH:  Cognitively intact   Wt Readings from Last 3 Encounters:  09/01/19 235 lb (106.6 kg)  07/29/19 241 lb (109.3 kg)  07/08/19 241 lb 3.2 oz (109.4 kg)      Studies/Labs Reviewed:   EKG:  EKG is not ordered today.    Recent Labs: 06/24/2019: TSH 3.39 07/01/2019: ALT 36; BUN 20; Creatinine, Ser 1.35; Hemoglobin 13.5; Platelets 240; Potassium 4.2; Sodium 143   Lipid Panel    Component Value Date/Time   CHOL 119 06/17/2019 0930   TRIG 111 06/17/2019 0930   HDL 41 06/17/2019 0930   CHOLHDL 2.9 06/17/2019 0930   CHOLHDL 4.2 12/12/2016 0330   VLDL 33 12/12/2016 0330   LDLCALC 58 06/17/2019 0930    Additional studies/ records that were reviewed today include:   Echocardiogram: 12/12/2016 Study Conclusions  - Left ventricle: The cavity size was normal. Wall thickness was   increased in a pattern of mild LVH. Systolic function was normal.   The estimated ejection fraction was in the range of 60% to 65%.   Wall motion was normal; there were no regional wall motion   abnormalities. Doppler parameters are consistent with abnormal   left ventricular relaxation (grade 1 diastolic dysfunction). The   E/e&' ratio is <8, suggesting normal LV filling pressure. - Mitral valve: Mildly thickened leaflets . There was trivial   regurgitation. - Left atrium: The atrium was normal in size. - Tricuspid valve: There was trivial regurgitation. - Pulmonic valve: There  was mild regurgitation. - Pulmonary arteries: PA peak pressure: 20 mm Hg (S). - Inferior vena cava: The vessel was normal in size. The   respirophasic diameter changes were in the normal range (>= 50%),   consistent with normal central venous pressure.  Impressions:  - LVEF 60-65%, mild LVH, normal wall motion, grade 1 DD, normal LV   filling pressure, normal LA size, trivial MR, TR, RVSP 20 mmHg,   normal IVC.  Cardiac Catheterization: 12/12/2016  Mid LAD lesion, 40 %stenosed.  The left ventricular systolic function is normal.  LV end diastolic pressure is normal.  The left ventricular ejection fraction is 55-65% by visual estimate.  Prox LAD lesion, 90 %stenosed.  A STENT PROMUS PREM MR 4.0X20 drug eluting stent was successfully placed.  Post intervention, there is a 0% residual stenosis.  Ost Cx to Prox Cx lesion, 80 %stenosed.  A STENT XIENCE ALPINE RX 3.0X18 drug eluting stent was successfully placed.  Post intervention, there is a 0% residual stenosis.   1. 2 vessel obstructive CAD 2. Normal LV function 3. Normal LVEDP 4. Successful stenting of the proximal LAD with DES 5. Successful stenting of the proximal LCx with DES.  Plan: DAPT for one year. Anticipate DC in am.   Myoview 07/29/19: Study Highlights   The left ventricular ejection fraction is mildly decreased (45-54%).  Nuclear stress EF: 54%.  There was no ST segment deviation noted during stress.  The study is normal.  This is a low risk study.      Assessment:    No diagnosis found.   Plan:   In order of problems listed above:  1. CAD s/p NSTEMI - s/p NSTEMI in July 2018. S/p  DES to both the LAD and LCx. Echo showed a preserved EF of 60-65% with no regional  WMA.  - He had one episode of chest pain in March. Myoview was normal. Suspect GERD.  - on ASA monotherapy.  - I will follow up in 6 months with lab.  2. HTN - well controlled. Continue Rx.   3. HLD - well controlled on  high dose statin. LDL at goal. 58.  4. Stage 3 CKD - creatinine stable. Followed by Dr. Joelyn Oms.   Signed, Ludmilla Mcgillis Martinique, MD  09/01/2019 8:20 AM    [facility] [address] [place], [place] [address] Phone: [phone]

## 2019-09-01 ENCOUNTER — Encounter: Payer: Self-pay | Admitting: Cardiology

## 2019-09-01 ENCOUNTER — Other Ambulatory Visit: Payer: Self-pay

## 2019-09-01 ENCOUNTER — Ambulatory Visit: Payer: Medicare Other | Admitting: Cardiology

## 2019-09-01 VITALS — BP 132/84 | HR 65 | Ht 72.0 in | Wt 235.0 lb

## 2019-09-01 DIAGNOSIS — I2510 Atherosclerotic heart disease of native coronary artery without angina pectoris: Secondary | ICD-10-CM | POA: Diagnosis not present

## 2019-09-01 DIAGNOSIS — I10 Essential (primary) hypertension: Secondary | ICD-10-CM

## 2019-09-01 DIAGNOSIS — K219 Gastro-esophageal reflux disease without esophagitis: Secondary | ICD-10-CM

## 2019-09-01 DIAGNOSIS — E785 Hyperlipidemia, unspecified: Secondary | ICD-10-CM | POA: Diagnosis not present

## 2019-09-02 ENCOUNTER — Other Ambulatory Visit: Payer: Self-pay | Admitting: Family Medicine

## 2019-09-09 ENCOUNTER — Telehealth: Payer: Self-pay | Admitting: Family Medicine

## 2019-09-09 DIAGNOSIS — H43813 Vitreous degeneration, bilateral: Secondary | ICD-10-CM | POA: Diagnosis not present

## 2019-09-09 DIAGNOSIS — M3210 Systemic lupus erythematosus, organ or system involvement unspecified: Secondary | ICD-10-CM | POA: Diagnosis not present

## 2019-09-09 DIAGNOSIS — H35383 Toxic maculopathy, bilateral: Secondary | ICD-10-CM | POA: Diagnosis not present

## 2019-09-09 NOTE — Progress Notes (Signed)
°  Chronic Care Management   Outreach Note  09/09/2019 Name: Dennis Moon MRN: [id number] DOB: March 11, 1946  Referred by: [name], MD Reason for referral : No chief complaint on file.   An unsuccessful telephone outreach was attempted today. The patient was referred to the pharmacist for assistance with care management and care coordination.  This note is not being shared with the patient for the following reason: To respect privacy (The patient or proxy has requested that the information not be shared). Follow Up Plan:   Earney Hamburg Upstream Scheduler

## 2019-09-16 ENCOUNTER — Telehealth: Payer: Self-pay | Admitting: Family Medicine

## 2019-09-16 NOTE — Progress Notes (Signed)
°  Chronic Care Management   Outreach Note  09/16/2019 Name: Dennis Moon MRN: [id number] DOB: Mar 05, 1946  Referred by: [name], MD Reason for referral : No chief complaint on file.   An unsuccessful telephone outreach was attempted today. The patient was referred to the pharmacist for assistance with care management and care coordination.  This note is not being shared with the patient for the following reason: To respect privacy (The patient or proxy has requested that the information not be shared). Follow Up Plan:   Earney Hamburg Upstream Scheduler

## 2019-09-23 DIAGNOSIS — D2271 Melanocytic nevi of right lower limb, including hip: Secondary | ICD-10-CM | POA: Diagnosis not present

## 2019-09-23 DIAGNOSIS — L821 Other seborrheic keratosis: Secondary | ICD-10-CM | POA: Diagnosis not present

## 2019-09-23 DIAGNOSIS — D2261 Melanocytic nevi of right upper limb, including shoulder: Secondary | ICD-10-CM | POA: Diagnosis not present

## 2019-09-23 DIAGNOSIS — L918 Other hypertrophic disorders of the skin: Secondary | ICD-10-CM | POA: Diagnosis not present

## 2019-09-23 DIAGNOSIS — D2262 Melanocytic nevi of left upper limb, including shoulder: Secondary | ICD-10-CM | POA: Diagnosis not present

## 2019-09-24 ENCOUNTER — Other Ambulatory Visit: Payer: Self-pay | Admitting: Family Medicine

## 2019-09-24 DIAGNOSIS — R519 Headache, unspecified: Secondary | ICD-10-CM

## 2019-10-05 ENCOUNTER — Encounter: Payer: Self-pay | Admitting: Rheumatology

## 2019-10-05 NOTE — Telephone Encounter (Signed)
Please schedule patient on  Friday for shoulder joint injection.

## 2019-10-06 NOTE — Progress Notes (Signed)
Office Visit Note  Patient: Dennis Moon             Date of Birth: [birth year]           MRN: [id number]             PCP: [name], MD Referring: [name],* Visit Date: 10/07/2019 Occupation: @GUAROCC @  Subjective:  Left shoulder joint pain   History of Present Illness: Dennis Moon is a 74 y.o. male with history of subcutaneous lupus, Sjogren's, and gout.  He takes allopurinol 300 mg 1 tablet by mouth daily for management of gout.  He denies having any gout flare.  He states for the last 6 weeks he has been having pain and discomfort in his left shoulder joint.  He is having difficulty lifting his left arm.  None of the other joints are painful.  He has some stiffness in his joints from osteoarthritis.  The lower back pain is tolerable.  He states he was on statins and had to come off the statins due to increased muscle pain.  His symptoms have improved coming off the statins.  Activities of Daily Living:  Patient reports morning stiffness for 0 none.   Patient Denies nocturnal pain.  Difficulty dressing/grooming: Reports Difficulty climbing stairs: Denies Difficulty getting out of chair: Denies Difficulty using hands for taps, buttons, cutlery, and/or writing: Denies  Review of Systems  Constitutional: Positive for fatigue. Negative for night sweats.  HENT: Positive for mouth dryness. Negative for mouth sores and nose dryness.   Eyes: Positive for dryness. Negative for redness.  Respiratory: Negative for shortness of breath and difficulty breathing.   Cardiovascular: Negative for chest pain, palpitations, hypertension, irregular heartbeat and swelling in legs/feet.  Gastrointestinal: Negative for constipation and diarrhea.  Endocrine: Negative for increased urination.  Genitourinary: Negative for painful urination.  Musculoskeletal: Positive for arthralgias, gait problem, joint pain, muscle weakness and muscle tenderness. Negative for joint  swelling, myalgias, morning stiffness and myalgias.  Skin: Negative for color change, rash, hair loss, nodules/bumps, skin tightness, ulcers and sensitivity to sunlight.  Allergic/Immunologic: Negative for susceptible to infections.  Neurological: Negative for dizziness, fainting, numbness, memory loss, night sweats and weakness.  Hematological: Negative for bruising/bleeding tendency and swollen glands.  Psychiatric/Behavioral: Negative for depressed mood and sleep disturbance. The patient is not nervous/anxious.     PMFS History:  Patient Active Problem List   Diagnosis Date Noted  . Nonintractable headache 06/24/2019  . Androgen deficiency 11/17/2017  . Hypothyroidism 11/17/2017  . Cyst of tonsil 06/11/2017  . Obesity (BMI 30.0-34.9) 03/12/2017  . CAD S/P percutaneous coronary angioplasty 12/13/2016  . NSTEMI (non-ST elevated myocardial infarction) ([place]) 12/11/2016  . Gout of multiple sites 08/26/2016  . Chronic renal impairment 08/26/2016  . Chronic kidney disease 08/26/2016  . Sjogren's syndrome ([place]) 04/23/2016  . 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 Idiopathic chronic gout, unspecified site, without tophus (tophi) 04/23/2016  . High risk medication use 04/23/2016  . Primary osteoarthritis of both hands 04/23/2016  . DDD (degenerative disc disease), lumbar 04/23/2016  . Kidney disease 04/23/2016    Past Medical History:  Diagnosis Date  . CAD S/P percutaneous coronary angioplasty 12/12/2016   a. 11/2016: NSTEMI with DES to LAD and DES to LCx  . Gout   . Hypertension   . Hypothyroidism   . Migraine   . Sjogren's disease ([place])   . Systemic lupus erythematosus (H[facility])     Family History  Problem Relation Age of Onset  .  Diabetes Mother   . Hypertension Mother   . Hypertension Father   . Healthy Son    Past Surgical History:  Procedure Laterality Date  . CATARACT EXTRACTION, BILATERAL Bilateral 2017  . CORONARY STENT INTERVENTION N/A 12/12/2016   Procedure: Coronary Stent  Intervention;  Surgeon: Martinique, Peter M, MD;  Location: [facility];  Service: Cardiovascular;  Laterality: N/A;  . HERNIA REPAIR    . LEFT HEART CATH AND CORONARY ANGIOGRAPHY N/A 12/12/2016   Procedure: Left Heart Cath and Coronary Angiography;  Surgeon: Martinique, Peter M, MD;  Location: [facility];  Service: Cardiovascular;  Laterality: N/A;  . THYROID SURGERY  1997   Social History   Social History Narrative  . Not on file   Immunization History  Administered Date(s) Administered  . Influenza-Unspecified 01/17/2017  . Pneumococcal Conjugate-13 10/21/2013  . Pneumococcal Polysaccharide-23 08/02/2008, 03/14/2016  . Tdap 07/30/2007  . Zoster 08/28/2006  . Zoster Recombinat (Shingrix) 11/12/2017     Objective: Vital Signs: BP (!) 108/57 (BP Location: Left Arm, Patient Position: Sitting, Cuff Size: Normal)   Pulse 61   Resp 16   Ht 6' (1.829 m)   Wt 235 lb 6.4 oz (106.8 kg)   BMI 31.93 kg/m    Physical Exam Vitals and nursing note reviewed.  Constitutional:      Appearance: He is well-developed.  HENT:     Head: Normocephalic and atraumatic.  Eyes:     Conjunctiva/sclera: Conjunctivae normal.     Pupils: Pupils are equal, round, and reactive to light.  Pulmonary:     Effort: Pulmonary effort is normal.  Abdominal:     General: Bowel sounds are normal.     Palpations: Abdomen is soft.  Musculoskeletal:     Cervical back: Normal range of motion and neck supple.  Skin:    General: Skin is warm and dry.     Capillary Refill: Capillary refill takes less than 2 seconds.  Neurological:     Mental Status: He is alert and oriented to person, place, and time.  Psychiatric:        Behavior: Behavior normal.      Musculoskeletal Exam: C-spine was in good range of motion.  He has limited abduction of his left shoulder joint to 90 degrees.  Right shoulder joint with full range of motion.  Elbow joints with good range of motion.  He has bilateral PIP and DIP  thickening with no synovitis.  Hip joints, knee joints, ankles with good range of motion.  CDAI Exam: CDAI Score: -- Patient Global: --; Provider Global: -- Swollen: --; Tender: -- Joint Exam 10/07/2019   No joint exam has been documented for this visit   There is currently no information documented on the homunculus. Go to the Rheumatology activity and complete the homunculus joint exam.  Investigation: No additional findings.  Imaging: XR Shoulder Left  Result Date: 10/07/2019 No glenohumeral joint space narrowing was noted.  Acromioclavicular joint space narrowing was noted.  No chondrocalcinosis was noted. Impression: These findings are consistent with acromioclavicular arthritis.   Recent Labs: Lab Results  Component Value Date   WBC 6.3 07/01/2019   HGB 13.5 07/01/2019   PLT 240 07/01/2019   NA 143 07/01/2019   K 4.2 07/01/2019   CL 100 07/01/2019   CO2 28 07/01/2019   GLUCOSE 101 (H) 07/01/2019   BUN 20 07/01/2019   CREATININE 1.35 (H) 07/01/2019   BILITOT 0.2 07/01/2019   ALKPHOS 87 07/01/2019  AST 31 07/01/2019   ALT 36 07/01/2019   PROT 7.1 07/01/2019   ALBUMIN 4.3 07/01/2019   CALCIUM 9.4 07/01/2019   GFRAA 59 (L) 07/01/2019    Speciality Comments: PLQ eye exam normal on 05/07/18  @ [facility].  Procedures:  Large Joint Inj: L subacromial bursa on 10/07/2019 11:41 AM Indications: pain Details: 27 G 1.5 in needle, posterior approach  Arthrogram: No  Medications: 1 mL lidocaine 1 %; 40 mg triamcinolone acetonide 40 MG/ML Aspirate: 0 mL Outcome: tolerated well, no immediate complications Procedure, treatment alternatives, risks and benefits explained, specific risks discussed. Consent was given by the patient. Immediately prior to procedure a time out was called to verify the correct patient, procedure, equipment, support staff and site/side marked as required. Patient was prepped and draped in the usual sterile fashion.     Allergies: Bee  venom   Assessment / Plan:     Visit Diagnoses:  Acute pain of left shoulder -patient denies any history of injury.  He is having difficulty lifting his left arm.  Examination was consistent with subacromial bursitis.  Plan: XR Shoulder Left.  The x-ray showed acromioclavicular arthritis.  After informed consent was obtained left shoulder joint was injected with cortisone as described above.  Postprocedure instructions were given.  A handout on exercises was given.  Subacute cutaneous lupus erythematosus-he has had no recent rash.  Has been using sunscreen.  High risk medication use - d/c PLQ due to abnormal eye exam 11/2018 - Plan: CBC with Differential/Platelet, COMPLETE METABOLIC PANEL WITH GFR in July.  Idiopathic chronic gout of multiple sites without tophus -he has not had any gout flares.  His uric acid has been in desirable range.  Plan: Uric acid in July.  Sjogren's syndrome with other organ involvement ([place]) - Positive ANA, positive Ro, positive La. he has mild sicca symptoms which are tolerable.  Primary osteoarthritis of both hands-joint protection was discussed.  DDD (degenerative disc disease), lumbar-he has chronic discomfort.  CAD S/P percutaneous coronary angioplasty  History of chronic kidney disease-GFR is between 35 and 60.    Orders: Orders Placed This Encounter  Procedures  . Large Joint Inj  . XR Shoulder Left  . CBC with Differential/Platelet  . COMPLETE METABOLIC PANEL WITH GFR  . Uric acid   No orders of the defined types were placed in this encounter.     Follow-Up Instructions: Return in 6 months (on 04/08/2020) for Subcutaneous lupus, Osteoarthritis, gout.   Bo Merino, MD  Note - This record has been created using Editor, commissioning.  Chart creation errors have been sought, but may not always  have been located. Such creation errors do not reflect on  the standard of medical care.

## 2019-10-07 ENCOUNTER — Other Ambulatory Visit: Payer: Self-pay

## 2019-10-07 ENCOUNTER — Ambulatory Visit: Payer: Medicare Other | Admitting: Rheumatology

## 2019-10-07 ENCOUNTER — Encounter: Payer: Self-pay | Admitting: Physician Assistant

## 2019-10-07 ENCOUNTER — Ambulatory Visit: Payer: Self-pay

## 2019-10-07 VITALS — BP 108/57 | HR 61 | Resp 16 | Ht 72.0 in | Wt 235.4 lb

## 2019-10-07 DIAGNOSIS — L931 Subacute cutaneous lupus erythematosus: Secondary | ICD-10-CM

## 2019-10-07 DIAGNOSIS — M19041 Primary osteoarthritis, right hand: Secondary | ICD-10-CM

## 2019-10-07 DIAGNOSIS — Z79899 Other long term (current) drug therapy: Secondary | ICD-10-CM | POA: Diagnosis not present

## 2019-10-07 DIAGNOSIS — M19042 Primary osteoarthritis, left hand: Secondary | ICD-10-CM

## 2019-10-07 DIAGNOSIS — Z9861 Coronary angioplasty status: Secondary | ICD-10-CM

## 2019-10-07 DIAGNOSIS — M25512 Pain in left shoulder: Secondary | ICD-10-CM | POA: Diagnosis not present

## 2019-10-07 DIAGNOSIS — M5136 Other intervertebral disc degeneration, lumbar region: Secondary | ICD-10-CM

## 2019-10-07 DIAGNOSIS — M1A09X0 Idiopathic chronic gout, multiple sites, without tophus (tophi): Secondary | ICD-10-CM

## 2019-10-07 DIAGNOSIS — Z87448 Personal history of other diseases of urinary system: Secondary | ICD-10-CM

## 2019-10-07 DIAGNOSIS — M3509 Sicca syndrome with other organ involvement: Secondary | ICD-10-CM | POA: Diagnosis not present

## 2019-10-07 DIAGNOSIS — I2510 Atherosclerotic heart disease of native coronary artery without angina pectoris: Secondary | ICD-10-CM

## 2019-10-07 NOTE — Patient Instructions (Addendum)
Please return in July for CBC, CMP and uric acid    Shoulder Exercises Ask your health care provider which exercises are safe for you. Do exercises exactly as told by your health care provider and adjust them as directed. It is normal to feel mild stretching, pulling, tightness, or discomfort as you do these exercises. Stop right away if you feel sudden pain or your pain gets worse. Do not begin these exercises until told by your health care provider. Stretching exercises External rotation and abduction This exercise is sometimes called corner stretch. This exercise rotates your arm outward (external rotation) and moves your arm out from your body (abduction). 1. Stand in a doorway with one of your feet slightly in front of the other. This is called a staggered stance. If you cannot reach your forearms to the door frame, stand facing a corner of a room. 2. Choose one of the following positions as told by your health care provider: ? Place your hands and forearms on the door frame above your head. ? Place your hands and forearms on the door frame at the height of your head. ? Place your hands on the door frame at the height of your elbows. 3. Slowly move your weight onto your front foot until you feel a stretch across your chest and in the front of your shoulders. Keep your head and chest upright and keep your abdominal muscles tight. 4. Hold for __________ seconds. 5. To release the stretch, shift your weight to your back foot. Repeat __________ times. Complete this exercise __________ times a day. Extension, standing 1. Stand and hold a broomstick, a cane, or a similar object behind your back. ? Your hands should be a little wider than shoulder width apart. ? Your palms should face away from your back. 2. Keeping your elbows straight and your shoulder muscles relaxed, move the stick away from your body until you feel a stretch in your shoulders (extension). ? Avoid shrugging your shoulders  while you move the stick. Keep your shoulder blades tucked down toward the middle of your back. 3. Hold for __________ seconds. 4. Slowly return to the starting position. Repeat __________ times. Complete this exercise __________ times a day. Range-of-motion exercises Pendulum  1. Stand near a wall or a surface that you can hold onto for balance. 2. Bend at the waist and let your left / right arm hang straight down. Use your other arm to support you. Keep your back straight and do not lock your knees. 3. Relax your left / right arm and shoulder muscles, and move your hips and your trunk so your left / right arm swings freely. Your arm should swing because of the motion of your body, not because you are using your arm or shoulder muscles. 4. Keep moving your hips and trunk so your arm swings in the following directions, as told by your health care provider: ? Side to side. ? Forward and backward. ? In clockwise and counterclockwise circles. 5. Continue each motion for __________ seconds, or for as long as told by your health care provider. 6. Slowly return to the starting position. Repeat __________ times. Complete this exercise __________ times a day. Shoulder flexion, standing  1. Stand and hold a broomstick, a cane, or a similar object. Place your hands a little more than shoulder width apart on the object. Your left / right hand should be palm up, and your other hand should be palm down. 2. Keep your elbow straight and  your shoulder muscles relaxed. Push the stick up with your healthy arm to raise your left / right arm in front of your body, and then over your head until you feel a stretch in your shoulder (flexion). ? Avoid shrugging your shoulder while you raise your arm. Keep your shoulder blade tucked down toward the middle of your back. 3. Hold for __________ seconds. 4. Slowly return to the starting position. Repeat __________ times. Complete this exercise __________ times a  day. Shoulder abduction, standing 1. Stand and hold a broomstick, a cane, or a similar object. Place your hands a little more than shoulder width apart on the object. Your left / right hand should be palm up, and your other hand should be palm down. 2. Keep your elbow straight and your shoulder muscles relaxed. Push the object across your body toward your left / right side. Raise your left / right arm to the side of your body (abduction) until you feel a stretch in your shoulder. ? Do not raise your arm above shoulder height unless your health care provider tells you to do that. ? If directed, raise your arm over your head. ? Avoid shrugging your shoulder while you raise your arm. Keep your shoulder blade tucked down toward the middle of your back. 3. Hold for __________ seconds. 4. Slowly return to the starting position. Repeat __________ times. Complete this exercise __________ times a day. Internal rotation  1. Place your left / right hand behind your back, palm up. 2. Use your other hand to dangle an exercise band, a towel, or a similar object over your shoulder. Grasp the band with your left / right hand so you are holding on to both ends. 3. Gently pull up on the band until you feel a stretch in the front of your left / right shoulder. The movement of your arm toward the center of your body is called internal rotation. ? Avoid shrugging your shoulder while you raise your arm. Keep your shoulder blade tucked down toward the middle of your back. 4. Hold for __________ seconds. 5. Release the stretch by letting go of the band and lowering your hands. Repeat __________ times. Complete this exercise __________ times a day. Strengthening exercises External rotation  1. Sit in a stable chair without armrests. 2. Secure an exercise band to a stable object at elbow height on your left / right side. 3. Place a soft object, such as a folded towel or a small pillow, between your left / right upper  arm and your body to move your elbow about 4 inches (10 cm) away from your side. 4. Hold the end of the exercise band so it is tight and there is no slack. 5. Keeping your elbow pressed against the soft object, slowly move your forearm out, away from your abdomen (external rotation). Keep your body steady so only your forearm moves. 6. Hold for __________ seconds. 7. Slowly return to the starting position. Repeat __________ times. Complete this exercise __________ times a day. Shoulder abduction  1. Sit in a stable chair without armrests, or stand up. 2. Hold a __________ weight in your left / right hand, or hold an exercise band with both hands. 3. Start with your arms straight down and your left / right palm facing in, toward your body. 4. Slowly lift your left / right hand out to your side (abduction). Do not lift your hand above shoulder height unless your health care provider tells you that this is  safe. ? Keep your arms straight. ? Avoid shrugging your shoulder while you do this movement. Keep your shoulder blade tucked down toward the middle of your back. 5. Hold for __________ seconds. 6. Slowly lower your arm, and return to the starting position. Repeat __________ times. Complete this exercise __________ times a day. Shoulder extension 1. Sit in a stable chair without armrests, or stand up. 2. Secure an exercise band to a stable object in front of you so it is at shoulder height. 3. Hold one end of the exercise band in each hand. Your palms should face each other. 4. Straighten your elbows and lift your hands up to shoulder height. 5. Step back, away from the secured end of the exercise band, until the band is tight and there is no slack. 6. Squeeze your shoulder blades together as you pull your hands down to the sides of your thighs (extension). Stop when your hands are straight down by your sides. Do not let your hands go behind your body. 7. Hold for __________ seconds. 8. Slowly  return to the starting position. Repeat __________ times. Complete this exercise __________ times a day. Shoulder row 1. Sit in a stable chair without armrests, or stand up. 2. Secure an exercise band to a stable object in front of you so it is at waist height. 3. Hold one end of the exercise band in each hand. Position your palms so that your thumbs are facing the ceiling (neutral position). 4. Bend each of your elbows to a 90-degree angle (right angle) and keep your upper arms at your sides. 5. Step back until the band is tight and there is no slack. 6. Slowly pull your elbows back behind you. 7. Hold for __________ seconds. 8. Slowly return to the starting position. Repeat __________ times. Complete this exercise __________ times a day. Shoulder press-ups  1. Sit in a stable chair that has armrests. Sit upright, with your feet flat on the floor. 2. Put your hands on the armrests so your elbows are bent and your fingers are pointing forward. Your hands should be about even with the sides of your body. 3. Push down on the armrests and use your arms to lift yourself off the chair. Straighten your elbows and lift yourself up as much as you comfortably can. ? Move your shoulder blades down, and avoid letting your shoulders move up toward your ears. ? Keep your feet on the ground. As you get stronger, your feet should support less of your body weight as you lift yourself up. 4. Hold for __________ seconds. 5. Slowly lower yourself back into the chair. Repeat __________ times. Complete this exercise __________ times a day. Wall push-ups  1. Stand so you are facing a stable wall. Your feet should be about one arm-length away from the wall. 2. Lean forward and place your palms on the wall at shoulder height. 3. Keep your feet flat on the floor as you bend your elbows and lean forward toward the wall. 4. Hold for __________ seconds. 5. Straighten your elbows to push yourself back to the starting  position. Repeat __________ times. Complete this exercise __________ times a day. This information is not intended to replace advice given to you by your health care provider. Make sure you discuss any questions you have with your health care provider. Document Revised: 08/27/2018 Document Reviewed: 06/04/2018 Elsevier Patient Education  [place].

## 2019-10-27 ENCOUNTER — Telehealth: Payer: Self-pay | Admitting: Family Medicine

## 2019-10-27 NOTE — Progress Notes (Signed)
  Chronic Care Management   Note  10/27/2019 Name: Dennis Moon MRN: [id number] DOB: 06/22/1945  Dennis Moon is a 74 y.o. year old male who is a primary care patient of [name], MD. I reached out to Dennis Moon by phone today in response to a referral sent by Mr. Prithvi Kooi Motton's PCP, [name], MD.   Mr. Smaldone was given information about Chronic Care Management services today including:  1. CCM service includes personalized support from designated clinical staff supervised by his physician, including individualized plan of care and coordination with other care providers 2. 24/7 contact phone numbers for assistance for urgent and routine care needs. 3. Service will only be billed when office clinical staff spend 20 minutes or more in a month to coordinate care. 4. Only one practitioner may furnish and bill the service in a calendar month. 5. The patient may stop CCM services at any time (effective at the end of the month) by phone call to the office staff.   Patient agreed to services and verbal consent obtained.   This note is not being shared with the patient for the following reason: To respect privacy (The patient or proxy has requested that the information not be shared).  Follow up plan:   Earney [place] [facility] Scheduler

## 2019-11-01 NOTE — Progress Notes (Signed)
Subjective:   Dennis Moon is a 74 y.o. male who presents for an Initial Medicare Annual Wellness Visit.  Review of Systems   Cardiac Risk Factors include: advanced age (>76men, >40 women);dyslipidemia;obesity (BMI >30kg/m2);male gender    Objective:    Today's Vitals   11/02/19 0806  BP: 112/82  Pulse: 80  Resp: 16  Temp: (!) 97.4 F (36.3 C)  TempSrc: Temporal  SpO2: 94%  Weight: 234 lb 12.8 oz (106.5 kg)  Height: 6' (1.829 m)   Body mass index is 31.84 kg/m.  Advanced Directives 11/02/2019 12/12/2016  Does Patient Have a Medical Advance Directive? Yes Yes  Type of Paramedic of [place];Living will Living will  Does patient want to make changes to medical advance directive? - No - Patient declined  Copy of [place] in Chart? No - copy requested -    Current Medications (verified) Outpatient Encounter Medications as of 11/02/2019  Medication Sig  . acyclovir ointment (ZOVIRAX) 5 % Apply 1 application topically as needed.  [name] allopurinol (ZYLOPRIM) 300 MG tablet Take 1 tablet (300 mg total) by mouth daily.  . ALPHAGAN P 0.1 % SOLN Apply 1 drop to eye 2 (two) times daily.  . Ascorbic Acid (VITAMIN C) 1000 MG tablet Take 1,000 mg by mouth daily.  [name] aspirin-acetaminophen-caffeine (EXCEDRIN EXTRA STRENGTH) 250-250-65 MG tablet Take by mouth every 6 (six) hours as needed for headache.  [name] atorvastatin (LIPITOR) 80 MG tablet Take 1 tablet (80 mg total) by mouth daily at 6 PM.  . Calcium Carb-Cholecalciferol (CALCIUM 600 + D PO) Take 1 tablet by mouth daily.  [name] CRANBERRY EXTRACT PO Take 1 tablet by mouth daily.  . DULoxetine (CYMBALTA) 60 MG capsule TAKE 1 CAPSULE BY MOUTH DAILY  . esomeprazole (NEXIUM 24HR) 20 MG capsule Takes 2 capsules ( 40 mg ) every night  . levothyroxine (SYNTHROID) 100 MCG tablet TAKE 1 TABLET(100 MCG) BY MOUTH AT BEDTIME  . Multiple Vitamins-Minerals (MULTIVITAMIN PO) Take 1 tablet by mouth daily.  .  nitroGLYCERIN (NITROSTAT) 0.4 MG SL tablet Place 1 tablet (0.4 mg total) under the tongue every 5 (five) minutes as needed for chest pain.  [name] propranolol (INDERAL) 60 MG tablet TAKE 1 TABLET(60 MG) BY MOUTH TWICE DAILY  . testosterone (ANDROGEL) 50 MG/5GM (1%) GEL APPLY 5 GRAMS(1 PACKET) ONTO THE SKIN TWICE DAILY  . Probiotic Product (PROBIOTIC PO) Take 1 capsule by mouth daily.   No facility-administered encounter medications on file as of 11/02/2019.    Allergies (verified) Bee venom   History: Past Medical History:  Diagnosis Date  . CAD S/P percutaneous coronary angioplasty 12/12/2016   a. 11/2016: NSTEMI with DES to LAD and DES to LCx  . Gout   . Hypertension   . Hypothyroidism   . Migraine   . Sjogren's disease ([place])   . Systemic lupus erythematosus ([place])    Past Surgical History:  Procedure Laterality Date  . CATARACT EXTRACTION, BILATERAL Bilateral 2017  . CORONARY STENT INTERVENTION N/A 12/12/2016   Procedure: Coronary Stent Intervention;  Surgeon: Martinique, Peter M, MD;  Location: [facility];  Service: Cardiovascular;  Laterality: N/A;  . HERNIA REPAIR    . LEFT HEART CATH AND CORONARY ANGIOGRAPHY N/A 12/12/2016   Procedure: Left Heart Cath and Coronary Angiography;  Surgeon: Martinique, Peter M, MD;  Location: [facility];  Service: Cardiovascular;  Laterality: N/A;  . THYROID SURGERY  1997   Family History  Problem Relation  Age of Onset  . Diabetes Mother   . Hypertension Mother   . Hypertension Father   . Healthy Son    Social History   Socioeconomic History  . Marital status: Married    Spouse name: Not on file  . Number of children: Not on file  . Years of education: Not on file  . Highest education level: Not on file  Occupational History  . Occupation: Pharmacist, community  Tobacco Use  . Smoking status: Former Smoker    Packs/day: 1.00    Years: 6.00    Pack years: 6.00    Quit date: 04/25/1966    Years since quitting: 53.5  . Smokeless tobacco:  Never Used  Vaping Use  . Vaping Use: Never used  Substance and Sexual Activity  . Alcohol use: Yes    Alcohol/week: 7.0 standard drinks    Types: 7 Glasses of wine per week  . Drug use: Never  . Sexual activity: Not on file  Other Topics Concern  . Not on file  Social History Narrative  . Not on file   Social Determinants of Health   Financial Resource Strain: Low Risk   . Difficulty of Paying Living Expenses: Not hard at all  Food Insecurity: No Food Insecurity  . Worried About Charity fundraiser in the Last Year: Never true  . Ran Out of Food in the Last Year: Never true  Transportation Needs: No Transportation Needs  . Lack of Transportation (Medical): No  . Lack of Transportation (Non-Medical): No  Physical Activity: Inactive  . Days of Exercise per Week: 0 days  . Minutes of Exercise per Session: 0 min  Stress: No Stress Concern Present  . Feeling of Stress : Not at all  Social Connections: Moderately Integrated  . Frequency of Communication with Friends and Family: More than three times a week  . Frequency of Social Gatherings with Friends and Family: More than three times a week  . Attends Religious Services: More than 4 times per year  . Active Member of Clubs or Organizations: No  . Attends Archivist Meetings: Never  . Marital Status: Married   Tobacco Counseling Counseling given: Not Answered   Clinical Intake:  Pre-visit preparation completed: Yes  Pain : No/denies pain     Nutritional Status: BMI > 30  Obese Nutritional Risks: None Diabetes: No  What is the last grade level you completed in school?: post graduate degree-dentist  Interpreter Needed?: No  Information entered by :: Caroleen Hamman LPN  Activities of Daily Living In your present state of health, do you have any difficulty performing the following activities: 11/02/2019  Hearing? N  Vision? N  Difficulty concentrating or making decisions? N  Walking or climbing stairs?  N  Dressing or bathing? N  Doing errands, shopping? N  Preparing Food and eating ? N  Using the Toilet? N  In the past six months, have you accidently leaked urine? N  Do you have problems with loss of bowel control? N  Managing your Medications? N  Managing your Finances? N  Housekeeping or managing your Housekeeping? N  Some recent data might be hidden     Immunizations and Health Maintenance Immunization History  Administered Date(s) Administered  . Influenza-Unspecified 01/17/2017  . PFIZER SARS-COV-2 Vaccination 06/17/2019, 07/08/2019  . Pneumococcal Conjugate-13 10/21/2013  . Pneumococcal Polysaccharide-23 08/02/2008, 03/14/2016  . Tdap 07/18/2019  . Zoster 08/28/2006  . Zoster Recombinat (Shingrix) 11/12/2017   There are no preventive care  reminders to display for this patient.  Patient Care Team: [name], MD as PCP - General (Family Medicine) Martinique, Peter M, MD as PCP - Cardiology (Cardiology) Germaine Pomfret, [facility] as Pharmacist (Pharmacist)  Indicate any recent Medical Services you may have received from other than [facility] providers in the past year (date may be approximate).    Assessment:   This is a routine wellness examination for Alix.  Hearing/Vision screen  Hearing Screening   125Hz  250Hz  500Hz  1000Hz  2000Hz  3000Hz  4000Hz  6000Hz  8000Hz   Right ear:           Left ear:           Comments: Hearing aids bilaterally  Vision Screening Comments: Wears glasses Last eye exam-6 months ago-has an appt on 11/04/19  Dietary issues and exercise activities discussed: Current Exercise Habits: Home exercise routine, Type of exercise: Other - see comments (exercise bike), Time (Minutes): 15, Frequency (Times/Week): 3, Weekly Exercise (Minutes/Week): 45, Intensity: Mild  Goals Addressed            This Visit's Progress   . Patient Stated       Cut out soft drinks, Healthier diet, increase excercise      Depression Screen PHQ 2/9 Scores  11/02/2019 06/24/2019  PHQ - 2 Score 0 0    Fall Risk Fall Risk  11/02/2019 06/24/2019  Falls in the past year? 0 1  Number falls in past yr: 0 0  Injury with Fall? 0 0  Comment - only briuse    FALL RISK PREVENTION PERTAINING TO THE HOME:  Any stairs in or around the home? Yes  If so, are there any without handrails? Yes   Home free of loose throw rugs in walkways, pet beds, electrical cords, etc? Yes  Adequate lighting in your home to reduce risk of falls? Yes   ASSISTIVE DEVICES UTILIZED TO PREVENT FALLS:  Life alert? No  Use of a cane, walker or w/c? No  Grab bars in the bathroom? Yes  Shower chair or bench in shower? No  Elevated toilet seat or a handicapped toilet? No    TIMED UP AND GO:  Was the test performed? Yes .  Length of time to ambulate 10 feet: 10 sec.   GAIT:  Appearance of gait: Gait steady and fast without the use of an assistive device.  Education: Fall risk prevention has been discussed.  Intervention(s) required? Yes   DME/home health order needed?  No    Cognitive Function:  No cognitive impairment noted. Patient still works full time as a Pharmacist, community.        Psychologist, educational  Topic Date Due  . INFLUENZA VACCINE  12/18/2019  . COLONOSCOPY  03/15/2025  . TETANUS/TDAP  07/17/2029  . COVID-19 Vaccine  Completed  . Hepatitis C Screening  Completed  . PNA vac Low Risk Adult  Completed    Qualifies for Shingles Vaccine? No Zostavax completed 08/28/2006 . Shingrix 11/12/2017  Tdap: Completed 07/2019  Flu Vaccine: Due 01/2020  Pneumococcal Vaccine:  Prevnar 13 completed-10/21/2013 Pnuemovax-23 completed 03/14/2016  Covid-19 Vaccine: Completed vaccines 06/2019  Cancer Screenings:  Colorectal Screening: Completed 03/16/2015 . Repeat every 5 years per note in chart. Referral placed to Dr. Madolyn Frieze per patient request.  Lung Cancer Screening: (Low Dose CT Chest recommended if Age 48-80 years, 30 pack-year currently smoking OR  have quit w/in 15years.) does not qualify.    Additional Screening:  Hepatitis C Screening:Completed 10/03/2016  Vision Screening: Recommended  annual ophthalmology exams for early detection of glaucoma and other disorders of the eye. Is the patient up to date with their annual eye exam?  Yes    Dental Screening: Recommended annual dental exams for proper oral hygiene  Community Resource Referral:  CRR required this visit?  No        Plan:  I have personally reviewed and addressed the Medicare Annual Wellness questionnaire and have noted the following in the patient's chart:  A. Medical and social history B. Use of alcohol, tobacco or illicit drugs  C. Current medications and supplements D. Functional ability and status E.  Nutritional status F.  Physical activity G. Advance directives H. List of other physicians I.  Hospitalizations, surgeries, and ER visits in previous 12 months J.  [place] such as hearing and vision if needed, cognitive and depression L. Referrals and appointments   In addition, I have reviewed and discussed with patient certain preventive protocols, quality metrics, and best practice recommendations. A written personalized care plan for preventive services as well as general preventive health recommendations were provided to patient.   Signed,    Marta Antu, LPN   08/26/7351  Nurse Health Advisor   Nurse Notes:  Patient to access AVS via mychart.

## 2019-11-02 ENCOUNTER — Other Ambulatory Visit: Payer: Self-pay

## 2019-11-02 ENCOUNTER — Ambulatory Visit (INDEPENDENT_AMBULATORY_CARE_PROVIDER_SITE_OTHER): Payer: Medicare Other

## 2019-11-02 ENCOUNTER — Ambulatory Visit: Payer: Medicare Other | Admitting: *Deleted

## 2019-11-02 VITALS — BP 112/82 | HR 80 | Temp 97.4°F | Resp 16 | Ht 72.0 in | Wt 234.8 lb

## 2019-11-02 DIAGNOSIS — Z0000 Encounter for general adult medical examination without abnormal findings: Secondary | ICD-10-CM | POA: Diagnosis not present

## 2019-11-02 DIAGNOSIS — Z1211 Encounter for screening for malignant neoplasm of colon: Secondary | ICD-10-CM

## 2019-11-02 NOTE — Patient Instructions (Addendum)
[name] , Thank you for taking time to come for your Medicare Wellness Visit. I appreciate your ongoing commitment to your health goals. Please review the following plan we discussed and let me know if I can assist you in the future.   Screening recommendations/referrals: Colonoscopy: Completed 03/16/2015-Per note in chart, repeat every 5 years. Referral placed. Recommended yearly ophthalmology/optometry visit for glaucoma screening and checkup Recommended yearly dental visit for hygiene and checkup  Vaccinations: Influenza vaccine: Due 01/2020 Pneumococcal vaccine: Completed 03/14/2016 Tdap vaccine: Up to date-07/2019-Due gain 07/2029 Shingles vaccine: Completed 11/12/2017 Covid-19: Completed 06/2019  Advanced directives: Bring a copy to next office visit  Conditions/risks identified: See problem list  Next appointment: Follow up in one year for your annual wellness visit.   Preventive Care 23 Years and Older, Male Preventive care refers to lifestyle choices and visits with your health care provider that can promote health and wellness. What does preventive care include?  A yearly physical exam. This is also called an annual well check.  Dental exams once or twice a year.  Routine eye exams. Ask your health care provider how often you should have your eyes checked.  Personal lifestyle choices, including:  Daily care of your teeth and gums.  Regular physical activity.  Eating a healthy diet.  Avoiding tobacco and drug use.  Limiting alcohol use.  Practicing safe sex.  Taking low doses of aspirin every day.  Taking vitamin and mineral supplements as recommended by your health care provider. What happens during an annual well check? The services and screenings done by your health care provider during your annual well check will depend on your age, overall health, lifestyle risk factors, and family history of disease. Counseling  Your health care provider may ask you  questions about your:  Alcohol use.  Tobacco use.  Drug use.  Emotional well-being.  Home and relationship well-being.  Sexual activity.  Eating habits.  History of falls.  Memory and ability to understand (cognition).  Work and work Statistician. Screening  You may have the following tests or measurements:  Height, weight, and BMI.  Blood pressure.  Lipid and cholesterol levels. These may be checked every 5 years, or more frequently if you are over 61 years old.  Skin check.  Lung cancer screening. You may have this screening every year starting at age 53 if you have a 30-pack-year history of smoking and currently smoke or have quit within the past 15 years.  Fecal occult blood test (FOBT) of the stool. You may have this test every year starting at age 46.  Flexible sigmoidoscopy or colonoscopy. You may have a sigmoidoscopy every 5 years or a colonoscopy every 10 years starting at age 25.  Prostate cancer screening. Recommendations will vary depending on your family history and other risks.  Hepatitis C blood test.  Hepatitis B blood test.  Sexually transmitted disease (STD) testing.  Diabetes screening. This is done by checking your blood sugar (glucose) after you have not eaten for a while (fasting). You may have this done every 1-3 years.  Abdominal aortic aneurysm (AAA) screening. You may need this if you are a current or former smoker.  Osteoporosis. You may be screened starting at age 23 if you are at high risk. Talk with your health care provider about your test results, treatment options, and if necessary, the need for more tests. Vaccines  Your health care provider may recommend certain vaccines, such as:  Influenza vaccine. This is recommended every year.  Tetanus, diphtheria, and acellular pertussis (Tdap, Td) vaccine. You may need a Td booster every 10 years.  Zoster vaccine. You may need this after age 66.  Pneumococcal 13-valent conjugate  (PCV13) vaccine. One dose is recommended after age 57.  Pneumococcal polysaccharide (PPSV23) vaccine. One dose is recommended after age 36. Talk to your health care provider about which screenings and vaccines you need and how often you need them. This information is not intended to replace advice given to you by your health care provider. Make sure you discuss any questions you have with your health care provider. Document Released: 06/01/2015 Document Revised: 01/23/2016 Document Reviewed: 03/06/2015 Elsevier Interactive Patient Education  2017 [place] Prevention in the Home Falls can cause injuries. They can happen to people of all ages. There are many things you can do to make your home safe and to help prevent falls. What can I do on the outside of my home?  Regularly fix the edges of walkways and driveways and fix any cracks.  Remove anything that might make you trip as you walk through a door, such as a raised step or threshold.  Trim any bushes or trees on the path to your home.  Use bright outdoor lighting.  Clear any walking paths of anything that might make someone trip, such as rocks or tools.  Regularly check to see if handrails are loose or broken. Make sure that both sides of any steps have handrails.  Any raised decks and porches should have guardrails on the edges.  Have any leaves, snow, or ice cleared regularly.  Use sand or salt on walking paths during winter.  Clean up any spills in your garage right away. This includes oil or grease spills. What can I do in the bathroom?  Use night lights.  Install grab bars by the toilet and in the tub and shower. Do not use towel bars as grab bars.  Use non-skid mats or decals in the tub or shower.  If you need to sit down in the shower, use a plastic, non-slip stool.  Keep the floor dry. Clean up any water that spills on the floor as soon as it happens.  Remove soap buildup in the tub or shower  regularly.  Attach bath mats securely with double-sided non-slip rug tape.  Do not have throw rugs and other things on the floor that can make you trip. What can I do in the bedroom?  Use night lights.  Make sure that you have a light by your bed that is easy to reach.  Do not use any sheets or blankets that are too big for your bed. They should not hang down onto the floor.  Have a firm chair that has side arms. You can use this for support while you get dressed.  Do not have throw rugs and other things on the floor that can make you trip. What can I do in the kitchen?  Clean up any spills right away.  Avoid walking on wet floors.  Keep items that you use a lot in easy-to-reach places.  If you need to reach something above you, use a strong step stool that has a grab bar.  Keep electrical cords out of the way.  Do not use floor polish or wax that makes floors slippery. If you must use wax, use non-skid floor wax.  Do not have throw rugs and other things on the floor that can make you trip. What can I do  with my stairs?  Do not leave any items on the stairs.  Make sure that there are handrails on both sides of the stairs and use them. Fix handrails that are broken or loose. Make sure that handrails are as long as the stairways.  Check any carpeting to make sure that it is firmly attached to the stairs. Fix any carpet that is loose or worn.  Avoid having throw rugs at the top or bottom of the stairs. If you do have throw rugs, attach them to the floor with carpet tape.  Make sure that you have a light switch at the top of the stairs and the bottom of the stairs. If you do not have them, ask someone to add them for you. What else can I do to help prevent falls?  Wear shoes that:  Do not have high heels.  Have rubber bottoms.  Are comfortable and fit you well.  Are closed at the toe. Do not wear sandals.  If you use a stepladder:  Make sure that it is fully  opened. Do not climb a closed stepladder.  Make sure that both sides of the stepladder are locked into place.  Ask someone to hold it for you, if possible.  Clearly mark and make sure that you can see:  Any grab bars or handrails.  First and last steps.  Where the edge of each step is.  Use tools that help you move around (mobility aids) if they are needed. These include:  Canes.  Walkers.  Scooters.  Crutches.  Turn on the lights when you go into a dark area. Replace any light bulbs as soon as they burn out.  Set up your furniture so you have a clear path. Avoid moving your furniture around.  If any of your floors are uneven, fix them.  If there are any pets around you, be aware of where they are.  Review your medicines with your doctor. Some medicines can make you feel dizzy. This can increase your chance of falling. Ask your doctor what other things that you can do to help prevent falls. This information is not intended to replace advice given to you by your health care provider. Make sure you discuss any questions you have with your health care provider. Document Released: 03/01/2009 Document Revised: 10/11/2015 Document Reviewed: 06/09/2014 Elsevier Interactive Patient Education  2017 [employer].

## 2019-11-04 ENCOUNTER — Other Ambulatory Visit: Payer: Self-pay

## 2019-11-04 DIAGNOSIS — D3132 Benign neoplasm of left choroid: Secondary | ICD-10-CM | POA: Diagnosis not present

## 2019-11-04 MED ORDER — ATORVASTATIN CALCIUM 80 MG PO TABS: 80.0000 mg | ORAL_TABLET | Freq: Every day | ORAL | 3 refills | Status: DC

## 2019-11-22 ENCOUNTER — Other Ambulatory Visit: Payer: Self-pay | Admitting: *Deleted

## 2019-11-22 MED ORDER — ALLOPURINOL 300 MG PO TABS: 300.0000 mg | ORAL_TABLET | Freq: Every day | ORAL | 0 refills | Status: DC

## 2019-11-22 NOTE — Telephone Encounter (Signed)
Last Visit: 10/07/2019 Next Visit: 04/06/2020 Labs: 07/01/2019 CBC is normal, GFR is low but is stable, uric acid is in desirable range.  Current Dose per office note 07/08/2019: allopurinol 300 mg 1 tablet by mouth daily DX: Idiopathic chronic gout of multiple sites without tophus   Okay to refill Allopurinol?

## 2019-11-28 NOTE — Chronic Care Management (AMB) (Signed)
Chronic Care Management Pharmacy  Name: Dennis Moon  MRN: [id number] DOB: 10-17-1945   Chief Complaint/ HPI  Dennis Moon,  74 y.o. , male presents for their Initial CCM visit with the clinical pharmacist via telephone due to COVID-19 Pandemic.  PCP : [name], MD Patient Care Team: [name], MD as PCP - General (Family Medicine) Martinique, Peter M, MD as PCP - Cardiology (Cardiology) Germaine Pomfret, [facility] as Pharmacist (Pharmacist)  Their chronic conditions include: Hyperlipidemia, GERD, Chronic Kidney Disease, Hypothyroidism, Osteoarthritis, Gout and Lupus   Office Visits: 11/02/19: Patient presented to Caroleen Hamman, LPN for AWV. Patient referred to GI for colonoscopy  06/24/19: Patient presented to Dr. Ethelene Hal for follow-up. Testosterone levels low, no medication changes made.   Consult Visit: 09/01/19: Patient presented to Dr. Martinique (Cardiology) for follow-up. Patient without chest pain. No medication changes made.  07/08/19: Patient presented to Dr. Estanislado Pandy (Rheumatology) for follow-up. Allopurinol decreased to 300 mg daily (pt preference), patient noted to not be taking aspirin.  07/06/19: Patient presented to Leonia Reader, NP (Cardiology) for evaluation of chest pain. Isolated incident, thought to be related to GERD.  Clobetasol stopped (patient no longer taking)   Patient currently works full time as a Pharmacist, community 4 days weekly. He is content with his health overall, although he wishes he could do more of the activities he used do to when he was younger (tennis, golf, skiing). He has not been able to do these activities due to ongoing physical limitations and muscular pain (primarily shoulder and back pain. When he is not working, he spends his time reading, exercising (walking, recumbunt bike that does not hurt shoulder ), or watching tv. Currently he is working to transfer care of his Environmental consultant.   Allergies  Allergen Reactions  .  Bee Venom Swelling    Medications: Outpatient Encounter Medications as of 12/02/2019  Medication Sig  . allopurinol (ZYLOPRIM) 300 MG tablet Take 1 tablet (300 mg total) by mouth daily.  . ALPHAGAN P 0.1 % SOLN Apply 1 drop to eye 2 (two) times daily.  [name] aspirin-acetaminophen-caffeine (EXCEDRIN EXTRA STRENGTH) 250-250-65 MG tablet Take by mouth every 6 (six) hours as needed for headache.  [name] atorvastatin (LIPITOR) 80 MG tablet Take 1 tablet (80 mg total) by mouth daily at 6 PM. (Patient taking differently: Take 40 mg by mouth daily at 6 PM. )  . calcium carbonate (OS-CAL) 600 MG TABS tablet Take by mouth 2 (two) times daily with a meal.  . cholecalciferol (VITAMIN D) 25 MCG (1000 UNIT) tablet Take 1,000 Units by mouth daily.  . Coenzyme Q10 300 MG CAPS Take 300 mg by mouth daily.  [name] CRANBERRY EXTRACT PO Take 4,200 mg by mouth in the morning and at bedtime.   . DULoxetine (CYMBALTA) 60 MG capsule TAKE 1 CAPSULE BY MOUTH DAILY  . esomeprazole (NEXIUM 24HR) 20 MG capsule 20 mg 2 (two) times daily before a meal.   . levocetirizine (XYZAL) 5 MG tablet Take 5 mg by mouth daily as needed for allergies.  [name] levothyroxine (SYNTHROID) 100 MCG tablet TAKE 1 TABLET(100 MCG) BY MOUTH AT BEDTIME  . magnesium oxide (MAG-OX) 400 MG tablet Take 400 mg by mouth daily.  . Multiple Vitamins-Minerals (MULTIVITAMIN PO) Take 1 tablet by mouth daily.  . Nutritional Supplements (GRAPESEED EXTRACT PO) Take 100 mg by mouth daily.  . Probiotic Product (PROBIOTIC PO) Take 1 capsule by mouth daily.  . propranolol (INDERAL) 60 MG tablet TAKE 1  TABLET(60 MG) BY MOUTH TWICE DAILY  . testosterone (ANDROGEL) 50 MG/5GM (1%) GEL APPLY 5 GRAMS(1 PACKET) ONTO THE SKIN TWICE DAILY  . TURMERIC PO Take 1 capsule by mouth in the morning and at bedtime.  [name] zinc gluconate 50 MG tablet Take 50 mg by mouth daily.  [name] acyclovir ointment (ZOVIRAX) 5 % Apply 1 application topically as needed. (Patient not taking: Reported on 12/02/2019)  .  Ascorbic Acid (VITAMIN C) 1000 MG tablet Take 1,000 mg by mouth daily.  . Calcium Carb-Cholecalciferol (CALCIUM 600 + D PO) Take 1 tablet by mouth daily.  . nitroGLYCERIN (NITROSTAT) 0.4 MG SL tablet Place 1 tablet (0.4 mg total) under the tongue every 5 (five) minutes as needed for chest pain.   No facility-administered encounter medications on file as of 12/02/2019.     Current Diagnosis/Assessment:  SDOH Interventions     Most Recent Value  SDOH Interventions  Financial Strain Interventions Intervention Not Indicated  Transportation Interventions Intervention Not Indicated      Goals Addressed            This Visit's Progress   . Chronic Care Management       CARE PLAN ENTRY (see longitudinal plan of care for additional care plan information)  Current Barriers:  . Chronic Disease Management support, education, and care coordination needs related to  Hyperlipidemia, GERD, Chronic Kidney Disease, Hypothyroidism, Osteoarthritis, Gout and Lupus    Hyperlipidemia Lab Results  Component Value Date/Time   LDLCALC 58 06/17/2019 09:30 AM   . Pharmacist Clinical Goal(s): o Over the next 90 days, patient will work with PharmD and providers to maintain LDL goal < 70 . Current regimen:  o Atorvastatin 40 mg daily . Interventions: o Recommend restarting atorvastatin 80 mg daily  . Patient self care activities - Over the next 90 days, patient will: o Limit intake of red meat, greasy and fatty food, and eat more fresh fruits and vegetables  Hypothyroidism TSH  Date Value Ref Range Status  06/24/2019 3.39 0.35 - 4.50 uIU/mL Final .  Pharmacist Clinical Goal(s) o Over the next 90 days, patient will work with PharmD and providers to maintain stable thyroid function . Current regimen:  o Levothyroxine 100 mcg daily  . Interventions: o Levothyroxine works best when taken first thing in the morning at least 30 minutes prior to food or other medications.   Medication  management . Pharmacist Clinical Goal(s): o Over the next 90 days, patient will work with PharmD and providers to maintain optimal medication adherence . Current pharmacy: [facility] . Interventions o Comprehensive medication review performed. o Utilize [facility] for medication synchronization, packaging and delivery  o Verbal consent obtained for [facility] enhanced pharmacy services (medication synchronization, adherence packaging, delivery coordination). A medication sync plan was created to allow patient to get all medications delivered once every 30 to 90 days per patient preference. Patient understands they have freedom to choose pharmacy and clinical pharmacist will coordinate care between all prescribers and [facility].  . Patient self care activities - Over the next 90 days, patient will: o Take medications as prescribed o Report any questions or concerns to PharmD and/or provider(s)      Hyperlipidemia   Managed by Dr. Martinique History of NSTEMI, 2-vessel obstructive CAD s/p PCI (2018)  LDL goal < 70  Lipid Panel     Component Value Date/Time   CHOL 119 06/17/2019 0930   TRIG 111 06/17/2019 0930   HDL 41 06/17/2019 0930  LDLCALC 58 06/17/2019 0930    Hepatic Function Latest Ref Rng & Units 07/01/2019 06/17/2019 06/02/2019  Total Protein 6.0 - 8.5 g/dL 7.1 7.1 7.4  Albumin 3.7 - 4.7 g/dL 4.3 4.2 4.5  AST 0 - 40 IU/L _0 ALT 0 - 44 IU/L 36 25 29  Alk Phosphatase 39 - 117 IU/L 87 85 91  Total Bilirubin 0.0 - 1.2 mg/dL 0.2 0.3 0.3  Bilirubin, Direct 0.00 - 0.40 mg/dL - 0.10 0.12    CMP Latest Ref Rng & Units 07/01/2019 06/17/2019 06/02/2019  Glucose 65 - 99 mg/dL 101(H) - 96  BUN 8 - 27 mg/dL 20 - 19  Creatinine 0.76 - 1.27 mg/dL 1.35(H) - 1.42(H)  Sodium 134 - 144 mmol/L 143 - 144  Potassium 3.5 - 5.2 mmol/L 4.2 - 4.4  Chloride 96 - 106 mmol/L 100 - 102  CO2 20 - 29 mmol/L 28 - 26  Calcium 8.6 - 10.2 mg/dL 9.4 - 9.5  Total Protein 6.0 - 8.5  g/dL 7.1 7.1 7.4  Total Bilirubin 0.0 - 1.2 mg/dL 0.2 0.3 0.3  Alkaline Phos 39 - 117 IU/L 87 85 91  AST 0 - 40 IU/L _1 ALT 0 - 44 IU/L 36 25 29   The ASCVD Risk score (Goff DC Jr., et al., 2013) failed to calculate for the following reasons:   The patient has a prior MI or stroke diagnosis   Patient has failed these meds in past: simvastatin Patient is currently controlled on the following medications:  . Atorvastatin 80 mg daily   We discussed:  Patient states for the past 3 months he has been taking atorvastatin 40 mg daily. He reduced the dose during a severe lupus flair, hoping it would help his myalgia pain. He never went back on the higher dose as he wanted to see if it would help with his flares.   Plan  Continue current medications  Recommend increasing atorvastatin back to 80 mg daily if follow-up LDL is >70 at next PCP appointment.   Hypothyroidism   Lab Results  Component Value Date/Time   TSH 3.39 06/24/2019 09:10 AM   TSH 6.351 (H) 12/12/2016 03:30 AM   TSH 3.82 03/14/2016 12:00 AM   FREET4 0.89 12/12/2016 03:30 AM    Patient has failed these meds in past: n/a Patient is currently controlled on the following medications:  . Levothyroxine 100 mcg daily   We discussed: Patient does not separate from other medications/food (takes after dinner with evening medications) but las TSH was in range.   Plan  Continue current medications  Recommend rechecking TSH at next PCP follow-up.   Gout   Uric Acid, Serum  Date Value Ref Range Status  02/04/2019 5.1 4.0 - 8.0 mg/dL Final    Comment:    Therapeutic target for gout patients: <6.0 mg/dL .    Uric Acid  Date Value Ref Range Status  07/01/2019 5.5 3.8 - 8.4 mg/dL Final    Comment:               Therapeutic target for gout patients: <6.0  06/02/2019 5.4 3.8 - 8.4 mg/dL Final    Comment:               Therapeutic target for gout patients: <6.0     Goal Uric Acid < 6 mg/dL   Medications that may  increase uric acid levels: Aspirin  Last gout flare: Patient unsure   Patient has failed these  meds in past: n/a Patient is currently controlled on the following medications:  . Allopurinol 300 mg daily  We discussed:  Counseled patient on low purine diet plan. Counseled patient to reduce consumption of high-fructose corn syrup, sweetened soft drinks, fruit juices, meat, and seafood. Counseled patient to avoid alcohol consumption.   Plan  Continue current medications   History of headache / Migraine   Previously managed by neurology, does not have neurologist in [place].   Patient has failed these meds in past: n/a Patient is currently controlled on the following medications:  . Duloxetine 60 mg daily   . Excedrin extra strength q6hr PRN  . Propanolol 60 mg BID  We discussed:  Takes Excedrin (takes three times daily most days) to prevent headaches  Plan  Continue current medications  GERD   Patient has failed these meds in past: n/a Patient is currently controlled on the following medications:  . Esomeprazole 20 mg 2 cap nightly  . Alka Seltzer Chewables PRN   We discussed:  If he skips a dose, has bad rebound reflux. Previous endoscopy negative for Barret's Esophagus.   Plan  Continue current medications  Cutaneous Lupus   Managed by Dr. Estanislado Pandy  Patient has failed these meds in past: Plaquenil (abnormal eye exam 2020) Patient is currently controlled on the following medications:  . 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 He still has flare sporadically, but otherwise has not noticed new dermatological changes due to his Lupus.   Plan  Continue control with diet and exercise   Misc / OTC    . Acylovir 5% ointment (not taking) . Alphagan 0.1% soln 1 drop BID  . Vitamin C 1000 mg daily  . Cranberry extract daily  4200 mg BID (kidneys) . Multivitamin daily (Centrum  Silver)  . Nitroglycerin 0.4 mg SL PRN (only used once in last 2-3 years)  . Probiotic daily  . Testosterone 1%gel 1 pack BID  . Xyzal 5 mg daily PRN (takes ~once monthly)  . Vitamin D3 1000 units daily . Magnesium 400 mg daily . Zinc 50 mg daily . Calcium 600 mg BID  . CoQ10 300 mg daily  . Grapeseed 100 mg daily (antioxidant)   Vaccines   Reviewed and discussed patient's vaccination history.    Immunization History  Administered Date(s) Administered  . Influenza-Unspecified 01/17/2017  . PFIZER SARS-COV-2 Vaccination 06/17/2019, 07/08/2019  . Pneumococcal Conjugate-13 10/21/2013  . Pneumococcal Polysaccharide-23 08/02/2008, 03/14/2016  . Tdap 07/18/2019  . Zoster 08/28/2006  . Zoster Recombinat (Shingrix) 11/12/2017   Medication Management   Pt uses [facility] for all medications Uses pill box? Yes  Plan  Utilize [facility] for medication synchronization, packaging and delivery  Verbal consent obtained for [facility] enhanced pharmacy services (medication synchronization, adherence packaging, delivery coordination). A medication sync plan was created to allow patient to get all medications delivered once every 30 to 90 days per patient preference. Patient understands they have freedom to choose pharmacy and clinical pharmacist will coordinate care between all prescribers and [facility].  Follow up: 6 month phone visit  [place] [facility]  [phone]

## 2019-11-28 NOTE — Addendum Note (Signed)
Addended by: Lynda Rainwater on: 11/28/2019 09:58 AM   Modules accepted: Orders

## 2019-12-02 ENCOUNTER — Ambulatory Visit: Payer: Medicare Other

## 2019-12-02 DIAGNOSIS — E039 Hypothyroidism, unspecified: Secondary | ICD-10-CM

## 2019-12-02 DIAGNOSIS — I214 Non-ST elevation (NSTEMI) myocardial infarction: Secondary | ICD-10-CM

## 2019-12-05 NOTE — Patient Instructions (Addendum)
Visit Information It was great speaking with you today!  Please let me know if you have any questions about our visit.  Goals Addressed            This Visit's Progress   . Chronic Care Management       CARE PLAN ENTRY (see longitudinal plan of care for additional care plan information)  Current Barriers:  . Chronic Disease Management support, education, and care coordination needs related to  Hyperlipidemia, GERD, Chronic Kidney Disease, Hypothyroidism, Osteoarthritis, Gout and Lupus    Hyperlipidemia Lab Results  Component Value Date/Time   LDLCALC 58 06/17/2019 09:30 AM   . Pharmacist Clinical Goal(s): o Over the next 90 days, patient will work with PharmD and providers to maintain LDL goal < 70 . Current regimen:  o Atorvastatin 40 mg daily . Interventions: o Recommend restarting atorvastatin 80 mg daily  . Patient self care activities - Over the next 90 days, patient will: o Limit intake of red meat, greasy and fatty food, and eat more fresh fruits and vegetables  Hypothyroidism TSH  Date Value Ref Range Status  06/24/2019 3.39 0.35 - 4.50 uIU/mL Final .  Pharmacist Clinical Goal(s) o Over the next 90 days, patient will work with PharmD and providers to maintain stable thyroid function . Current regimen:  o Levothyroxine 100 mcg daily  . Interventions: o Levothyroxine works best when taken first thing in the morning at least 30 minutes prior to food or other medications.   Medication management . Pharmacist Clinical Goal(s): o Over the next 90 days, patient will work with PharmD and providers to maintain optimal medication adherence . Current pharmacy: [facility] . Interventions o Comprehensive medication review performed. o Utilize [facility] for medication synchronization, packaging and delivery  o Verbal consent obtained for [facility] enhanced pharmacy services (medication synchronization, adherence packaging, delivery coordination). A medication  sync plan was created to allow patient to get all medications delivered once every 30 to 90 days per patient preference. Patient understands they have freedom to choose pharmacy and clinical pharmacist will coordinate care between all prescribers and [facility].  . Patient self care activities - Over the next 90 days, patient will: o Take medications as prescribed o Report any questions or concerns to PharmD and/or provider(s)       Mr. Sobieski was given information about Chronic Care Management services today including:  1. CCM service includes personalized support from designated clinical staff supervised by his physician, including individualized plan of care and coordination with other care providers 2. 24/7 contact phone numbers for assistance for urgent and routine care needs. 3. Standard insurance, coinsurance, copays and deductibles apply for chronic care management only during months in which we provide at least 20 minutes of these services. Most insurances cover these services at 100%, however patients may be responsible for any copay, coinsurance and/or deductible if applicable. This service may help you avoid the need for more expensive face-to-face services. 4. Only one practitioner may furnish and bill the service in a calendar month. 5. The patient may stop CCM services at any time (effective at the end of the month) by phone call to the office staff.  Patient agreed to services and verbal consent obtained.   The patient verbalized understanding of instructions provided today and declined a print copy of patient instruction materials.  Telephone follow up appointment with pharmacy team member scheduled for: 06/01/20 at 1:00 PM  [facility]  [phone]  DASH Eating Plan DASH stands for "Dietary Approaches to Stop Hypertension." The DASH eating plan is a healthy eating plan that has been shown to reduce high  blood pressure (hypertension). It may also reduce your risk for type 2 diabetes, heart disease, and stroke. The DASH eating plan may also help with weight loss. What are tips for following this plan?  General guidelines  Avoid eating more than 2,300 mg (milligrams) of salt (sodium) a day. If you have hypertension, you may need to reduce your sodium intake to 1,500 mg a day.  Limit alcohol intake to no more than 1 drink a day for nonpregnant women and 2 drinks a day for men. One drink equals 12 oz of beer, 5 oz of wine, or 1 oz of hard liquor.  Work with your health care provider to maintain a healthy body weight or to lose weight. Ask what an ideal weight is for you.  Get at least 30 minutes of exercise that causes your heart to beat faster (aerobic exercise) most days of the week. Activities may include walking, swimming, or biking.  Work with your health care provider or diet and nutrition specialist (dietitian) to adjust your eating plan to your individual calorie needs. Reading food labels   Check food labels for the amount of sodium per serving. Choose foods with less than 5 percent of the Daily Value of sodium. Generally, foods with less than 300 mg of sodium per serving fit into this eating plan.  To find whole grains, look for the word "whole" as the first word in the ingredient list. Shopping  Buy products labeled as "low-sodium" or "no salt added."  Buy fresh foods. Avoid canned foods and premade or frozen meals. Cooking  Avoid adding salt when cooking. Use salt-free seasonings or herbs instead of table salt or sea salt. Check with your health care provider or pharmacist before using salt substitutes.  Do not fry foods. Cook foods using healthy methods such as baking, boiling, grilling, and broiling instead.  Cook with heart-healthy oils, such as olive, canola, soybean, or sunflower oil. Meal planning  Eat a balanced diet that includes: ? 5 or more servings of fruits and  vegetables each day. At each meal, try to fill half of your plate with fruits and vegetables. ? Up to 6-8 servings of whole grains each day. ? Less than 6 oz of lean meat, poultry, or fish each day. A 3-oz serving of meat is about the same size as a deck of cards. One egg equals 1 oz. ? 2 servings of low-fat dairy each day. ? A serving of nuts, seeds, or beans 5 times each week. ? Heart-healthy fats. Healthy fats called Omega-3 fatty acids are found in foods such as flaxseeds and coldwater fish, like sardines, salmon, and mackerel.  Limit how much you eat of the following: ? Canned or prepackaged foods. ? Food that is high in trans fat, such as fried foods. ? Food that is high in saturated fat, such as fatty meat. ? Sweets, desserts, sugary drinks, and other foods with added sugar. ? Full-fat dairy products.  Do not salt foods before eating.  Try to eat at least 2 vegetarian meals each week.  Eat more home-cooked food and less restaurant, buffet, and fast food.  When eating at a restaurant, ask that your food be prepared with less salt or no salt, if possible. What foods are recommended? The items listed may not be a complete list. Talk with your dietitian  about what dietary choices are best for you. Grains Whole-grain or whole-wheat bread. Whole-grain or whole-wheat pasta. Brown rice. Modena Morrow. Bulgur. Whole-grain and low-sodium cereals. Pita bread. Low-fat, low-sodium crackers. Whole-wheat flour tortillas. Vegetables Fresh or frozen vegetables (raw, steamed, roasted, or grilled). Low-sodium or reduced-sodium tomato and vegetable juice. Low-sodium or reduced-sodium tomato sauce and tomato paste. Low-sodium or reduced-sodium canned vegetables. Fruits All fresh, dried, or frozen fruit. Canned fruit in natural juice (without added sugar). Meat and other protein foods Skinless chicken or [place]. Ground chicken or [place]. Pork with fat trimmed off. Fish and seafood. Egg whites. Dried  beans, peas, or lentils. Unsalted nuts, nut butters, and seeds. Unsalted canned beans. Lean cuts of beef with fat trimmed off. Low-sodium, lean deli meat. Dairy Low-fat (1%) or fat-free (skim) milk. Fat-free, low-fat, or reduced-fat cheeses. Nonfat, low-sodium ricotta or cottage cheese. Low-fat or nonfat yogurt. Low-fat, low-sodium cheese. Fats and oils Soft margarine without trans fats. Vegetable oil. Low-fat, reduced-fat, or light mayonnaise and salad dressings (reduced-sodium). Canola, safflower, olive, soybean, and sunflower oils. Avocado. Seasoning and other foods Herbs. Spices. Seasoning mixes without salt. Unsalted popcorn and pretzels. Fat-free sweets. What foods are not recommended? The items listed may not be a complete list. Talk with your dietitian about what dietary choices are best for you. Grains Baked goods made with fat, such as croissants, muffins, or some breads. Dry pasta or rice meal packs. Vegetables Creamed or fried vegetables. Vegetables in a cheese sauce. Regular canned vegetables (not low-sodium or reduced-sodium). Regular canned tomato sauce and paste (not low-sodium or reduced-sodium). Regular tomato and vegetable juice (not low-sodium or reduced-sodium). Angie Fava. Olives. Fruits Canned fruit in a light or heavy syrup. Fried fruit. Fruit in cream or butter sauce. Meat and other protein foods Fatty cuts of meat. Ribs. Fried meat. Berniece Salines. Sausage. Bologna and other processed lunch meats. Salami. Fatback. Hotdogs. Bratwurst. Salted nuts and seeds. Canned beans with added salt. Canned or smoked fish. Whole eggs or egg yolks. Chicken or [place] with skin. Dairy Whole or 2% milk, cream, and half-and-half. Whole or full-fat cream cheese. Whole-fat or sweetened yogurt. Full-fat cheese. Nondairy creamers. Whipped toppings. Processed cheese and cheese spreads. Fats and oils Butter. Stick margarine. Lard. Shortening. Ghee. Bacon fat. Tropical oils, such as coconut, palm kernel, or  palm oil. Seasoning and other foods Salted popcorn and pretzels. Onion salt, garlic salt, seasoned salt, table salt, and sea salt. Worcestershire sauce. Tartar sauce. Barbecue sauce. Teriyaki sauce. Soy sauce, including reduced-sodium. Steak sauce. Canned and packaged gravies. Fish sauce. Oyster sauce. Cocktail sauce. Horseradish that you find on the shelf. Ketchup. Mustard. Meat flavorings and tenderizers. Bouillon cubes. Hot sauce and Tabasco sauce. Premade or packaged marinades. Premade or packaged taco seasonings. Relishes. Regular salad dressings. Where to find more information:  National Heart, Lung, and [place]: https://wilson-eaton.com/  American Heart Association: www.heart.org Summary  The DASH eating plan is a healthy eating plan that has been shown to reduce high blood pressure (hypertension). It may also reduce your risk for type 2 diabetes, heart disease, and stroke.  With the DASH eating plan, you should limit salt (sodium) intake to 2,300 mg a day. If you have hypertension, you may need to reduce your sodium intake to 1,500 mg a day.  When on the DASH eating plan, aim to eat more fresh fruits and vegetables, whole grains, lean proteins, low-fat dairy, and heart-healthy fats.  Work with your health care provider or diet and nutrition specialist (dietitian) to adjust your eating plan to  your individual calorie needs. This information is not intended to replace advice given to you by your health care provider. Make sure you discuss any questions you have with your health care provider. Document Revised: 04/17/2017 Document Reviewed: 04/28/2016 Elsevier Patient Education  2020 [employer].

## 2019-12-06 ENCOUNTER — Other Ambulatory Visit: Payer: Self-pay | Admitting: Cardiology

## 2019-12-06 NOTE — Telephone Encounter (Signed)
*  STAT* If patient is at the pharmacy, call can be transferred to refill team.   1. Which medications need to be refilled? (please list name of each medication and dose if known)  atorvastatin (LIPITOR) 80 MG tablet nitroGLYCERIN (NITROSTAT) 0.4 MG SL tablet  2. Which pharmacy/location (including street and city if local pharmacy) is medication to be sent to? [facility] - [place], [place] - [place] [address]  3. Do they need a 30 day or 90 day supply? 90 day supply

## 2019-12-28 ENCOUNTER — Other Ambulatory Visit: Payer: Self-pay

## 2019-12-29 ENCOUNTER — Encounter: Payer: Self-pay | Admitting: Family Medicine

## 2019-12-29 ENCOUNTER — Ambulatory Visit (INDEPENDENT_AMBULATORY_CARE_PROVIDER_SITE_OTHER): Payer: Medicare Other | Admitting: Family Medicine

## 2019-12-29 VITALS — BP 136/72 | HR 55 | Temp 97.8°F | Ht 72.0 in | Wt 242.0 lb

## 2019-12-29 DIAGNOSIS — Z0000 Encounter for general adult medical examination without abnormal findings: Secondary | ICD-10-CM | POA: Diagnosis not present

## 2019-12-29 DIAGNOSIS — E7800 Pure hypercholesterolemia, unspecified: Secondary | ICD-10-CM | POA: Diagnosis not present

## 2019-12-29 DIAGNOSIS — Z8739 Personal history of other diseases of the musculoskeletal system and connective tissue: Secondary | ICD-10-CM

## 2019-12-29 DIAGNOSIS — E291 Testicular hypofunction: Secondary | ICD-10-CM

## 2019-12-29 DIAGNOSIS — Z125 Encounter for screening for malignant neoplasm of prostate: Secondary | ICD-10-CM | POA: Insufficient documentation

## 2019-12-29 DIAGNOSIS — E039 Hypothyroidism, unspecified: Secondary | ICD-10-CM | POA: Diagnosis not present

## 2019-12-29 LAB — COMPREHENSIVE METABOLIC PANEL
ALT: 26 U/L (ref 0–53)
AST: 27 U/L (ref 0–37)
Albumin: 4.2 g/dL (ref 3.5–5.2)
Alkaline Phosphatase: 76 U/L (ref 39–117)
BUN: 27 mg/dL — ABNORMAL HIGH (ref 6–23)
CO2: 34 mEq/L — ABNORMAL HIGH (ref 19–32)
Calcium: 9.3 mg/dL (ref 8.4–10.5)
Chloride: 101 mEq/L (ref 96–112)
Creatinine, Ser: 1.39 mg/dL (ref 0.40–1.50)
GFR: 49.74 mL/min — ABNORMAL LOW (ref >60.00)
Glucose, Bld: 97 mg/dL (ref 70–99)
Potassium: 4.5 mEq/L (ref 3.5–5.1)
Sodium: 141 mEq/L (ref 135–145)
Total Bilirubin: 0.5 mg/dL (ref 0.2–1.2)
Total Protein: 7.1 g/dL (ref 6.0–8.3)

## 2019-12-29 LAB — LIPID PANEL
Cholesterol: 152 mg/dL (ref 0–200)
HDL: 44.6 mg/dL (ref >39.00)
LDL Cholesterol: 70 mg/dL (ref 0–99)
NonHDL: 107.64
Total CHOL/HDL Ratio: 3
Triglycerides: 188 mg/dL — ABNORMAL HIGH (ref 0.0–149.0)
VLDL: 37.6 mg/dL (ref 0.0–40.0)

## 2019-12-29 LAB — URIC ACID: Uric Acid, Serum: 5.1 mg/dL (ref 4.0–7.8)

## 2019-12-29 LAB — CBC
HCT: 42.8 % (ref 39.0–52.0)
Hemoglobin: 14.2 g/dL (ref 13.0–17.0)
MCHC: 33.1 g/dL (ref 30.0–36.0)
MCV: 96.3 fl (ref 78.0–100.0)
Platelets: 202 10*3/uL (ref 150.0–400.0)
RBC: 4.44 Mil/uL (ref 4.22–5.81)
RDW: 15.2 % (ref 11.5–15.5)
WBC: 5.8 10*3/uL (ref 4.0–10.5)

## 2019-12-29 LAB — TESTOSTERONE: Testosterone: 145.35 ng/dL — ABNORMAL LOW (ref 300.00–890.00)

## 2019-12-29 LAB — TSH: TSH: 5.67 u[IU]/mL — ABNORMAL HIGH (ref 0.35–4.50)

## 2019-12-29 LAB — PSA: PSA: 0.93 ng/mL (ref 0.10–4.00)

## 2019-12-29 NOTE — Progress Notes (Addendum)
Established Patient Office Visit  Subjective:  Patient ID: Dennis Moon, male    DOB: [birth year]  Age: 74 y.o. MRN: [id number]  CC:  Chief Complaint  Patient presents with  . Follow-up    6 month follow up, no concerns. Patient fasting.     HPI OSHUA MCCONAHA presents for follow-up hypertension, coronary artery disease with history of elevated cholesterol, hypothyroidism, androgen deficiency and history of gout.  Blood pressures well controlled with propranolol.  Denies chest pain or need for nitroglycerin.  Cholesterol has been well controlled high-dose atorvastatin.  TSH adjusted with 100 mcg of levothyroxine.  Urine flow is good.  Occasional urgency and nocturia.  Energy levels remain good with testosterone replacement therapy through [facility].  Continues to practice dentistry.  Past Medical History:  Diagnosis Date  . CAD S/P percutaneous coronary angioplasty 12/12/2016   a. 11/2016: NSTEMI with DES to LAD and DES to LCx  . Gout   . Hypertension   . Hypothyroidism   . Migraine   . Sjogren's disease ([place])   . Systemic lupus erythematosus ([place])     Past Surgical History:  Procedure Laterality Date  . CATARACT EXTRACTION, BILATERAL Bilateral 2017  . CORONARY STENT INTERVENTION N/A 12/12/2016   Procedure: Coronary Stent Intervention;  Surgeon: Martinique, Peter M, MD;  Location: [facility];  Service: Cardiovascular;  Laterality: N/A;  . HERNIA REPAIR    . LEFT HEART CATH AND CORONARY ANGIOGRAPHY N/A 12/12/2016   Procedure: Left Heart Cath and Coronary Angiography;  Surgeon: Martinique, Peter M, MD;  Location: [facility];  Service: Cardiovascular;  Laterality: N/A;  . THYROID SURGERY  1997    Family History  Problem Relation Age of Onset  . Diabetes Mother   . Hypertension Mother   . Hypertension Father   . Healthy Son     Social History   Socioeconomic History  . Marital status: Married    Spouse name: Not on file  . Number of children: Not on file    . Years of education: Not on file  . Highest education level: Not on file  Occupational History  . Occupation: Pharmacist, community  Tobacco Use  . Smoking status: Former Smoker    Packs/day: 1.00    Years: 6.00    Pack years: 6.00    Quit date: 04/25/1966    Years since quitting: 53.7  . Smokeless tobacco: Never Used  Vaping Use  . Vaping Use: Never used  Substance and Sexual Activity  . Alcohol use: Yes    Alcohol/week: 7.0 standard drinks    Types: 7 Glasses of wine per week  . Drug use: Never  . Sexual activity: Not on file  Other Topics Concern  . Not on file  Social History Narrative  . Not on file   Social Determinants of Health   Financial Resource Strain: Low Risk   . Difficulty of Paying Living Expenses: Not hard at all  Food Insecurity: No Food Insecurity  . Worried About Charity fundraiser in the Last Year: Never true  . Ran Out of Food in the Last Year: Never true  Transportation Needs: No Transportation Needs  . Lack of Transportation (Medical): No  . Lack of Transportation (Non-Medical): No  Physical Activity: Inactive  . Days of Exercise per Week: 0 days  . Minutes of Exercise per Session: 0 min  Stress: No Stress Concern Present  . Feeling of Stress : Not at all  Social Connections: Moderately  Integrated  . Frequency of Communication with Friends and Family: More than three times a week  . Frequency of Social Gatherings with Friends and Family: More than three times a week  . Attends Religious Services: More than 4 times per year  . Active Member of Clubs or Organizations: No  . Attends Archivist Meetings: Never  . Marital Status: Married  Human resources officer Violence:   . Fear of Current or Ex-Partner:   . Emotionally Abused:   [name] Physically Abused:   . Sexually Abused:     Outpatient Medications Prior to Visit  Medication Sig Dispense Refill  . allopurinol (ZYLOPRIM) 300 MG tablet Take 1 tablet (300 mg total) by mouth daily. 90 tablet 0  .  ALPHAGAN P 0.1 % SOLN Apply 1 drop to eye 2 (two) times daily.    . Ascorbic Acid (VITAMIN C) 1000 MG tablet Take 1,000 mg by mouth daily.    [name] aspirin-acetaminophen-caffeine (EXCEDRIN EXTRA STRENGTH) 250-250-65 MG tablet Take by mouth every 6 (six) hours as needed for headache.    [name] atorvastatin (LIPITOR) 80 MG tablet Take 1 tablet (80 mg total) by mouth daily at 6 PM. (Patient taking differently: Take 40 mg by mouth daily at 6 PM. ) 90 tablet 3  . Calcium Carb-Cholecalciferol (CALCIUM 600 + D PO) Take 1 tablet by mouth daily.    . cholecalciferol (VITAMIN D) 25 MCG (1000 UNIT) tablet Take 1,000 Units by mouth daily.    . Coenzyme Q10 300 MG CAPS Take 300 mg by mouth daily.    [name] CRANBERRY EXTRACT PO Take 4,200 mg by mouth in the morning and at bedtime.     . DULoxetine (CYMBALTA) 60 MG capsule TAKE 1 CAPSULE BY MOUTH DAILY 90 capsule 1  . esomeprazole (NEXIUM 24HR) 20 MG capsule 20 mg 2 (two) times daily before a meal.  60 capsule 6  . levocetirizine (XYZAL) 5 MG tablet Take 5 mg by mouth daily as needed for allergies.    . magnesium oxide (MAG-OX) 400 MG tablet Take 400 mg by mouth daily.    . Multiple Vitamins-Minerals (MULTIVITAMIN PO) Take 1 tablet by mouth daily.    . Nutritional Supplements (GRAPESEED EXTRACT PO) Take 100 mg by mouth daily.    . Probiotic Product (PROBIOTIC PO) Take 1 capsule by mouth daily.    . propranolol (INDERAL) 60 MG tablet TAKE 1 TABLET(60 MG) BY MOUTH TWICE DAILY 180 tablet 4  . testosterone (ANDROGEL) 50 MG/5GM (1%) GEL APPLY 5 GRAMS(1 PACKET) ONTO THE SKIN TWICE DAILY 300 g 4  . TURMERIC PO Take 1 capsule by mouth in the morning and at bedtime.    [name] zinc gluconate 50 MG tablet Take 50 mg by mouth daily.    [name] levothyroxine (SYNTHROID) 100 MCG tablet TAKE 1 TABLET(100 MCG) BY MOUTH AT BEDTIME 90 tablet 2  . acyclovir ointment (ZOVIRAX) 5 % Apply 1 application topically as needed. (Patient not taking: Reported on 12/02/2019) 15 g 0  . nitroGLYCERIN (NITROSTAT) 0.4  MG SL tablet Place 1 tablet (0.4 mg total) under the tongue every 5 (five) minutes as needed for chest pain. (Patient not taking: Reported on 12/29/2019) 25 tablet 2  . calcium carbonate (OS-CAL) 600 MG TABS tablet Take by mouth 2 (two) times daily with a meal. (Patient not taking: Reported on 12/29/2019)     No facility-administered medications prior to visit.    Allergies  Allergen Reactions  . Bee Venom Swelling    ROS Review  of Systems  Constitutional: Negative.   HENT: Negative.   Respiratory: Negative.   Cardiovascular: Negative.   Gastrointestinal: Negative.   Endocrine: Negative for polyphagia and polyuria.  Genitourinary: Negative for difficulty urinating, frequency and hematuria.  Musculoskeletal: Positive for arthralgias. Negative for gait problem and joint swelling.  Skin: Negative for rash.  Allergic/Immunologic: Negative for immunocompromised state.  Neurological: Negative for speech difficulty and light-headedness.  Hematological: Does not bruise/bleed easily.  Psychiatric/Behavioral: Negative.       Objective:    Physical Exam Vitals and nursing note reviewed.  Constitutional:      General: He is not in acute distress.    Appearance: Normal appearance. He is normal weight. He is not ill-appearing, toxic-appearing or diaphoretic.  HENT:     Head: Normocephalic and atraumatic.     Right Ear: Tympanic membrane, ear canal and external ear normal.     Left Ear: Tympanic membrane, ear canal and external ear normal.     Mouth/Throat:     Mouth: Mucous membranes are moist.     Pharynx: Oropharynx is clear. No oropharyngeal exudate or posterior oropharyngeal erythema.  Eyes:     General: No scleral icterus.       Right eye: No discharge.        Left eye: No discharge.     Extraocular Movements: Extraocular movements intact.     Conjunctiva/sclera: Conjunctivae normal.     Pupils: Pupils are equal, round, and reactive to light.  Cardiovascular:     Rate and  Rhythm: Normal rate and regular rhythm.  Pulmonary:     Effort: Pulmonary effort is normal.     Breath sounds: Normal breath sounds.  Abdominal:     General: Bowel sounds are normal.  Musculoskeletal:     Cervical back: No rigidity or tenderness.     Right lower leg: No edema.     Left lower leg: No edema.  Lymphadenopathy:     Cervical: No cervical adenopathy.  Skin:    General: Skin is warm and dry.  Neurological:     Mental Status: He is alert and oriented to person, place, and time.  Psychiatric:        Mood and Affect: Mood normal.        Behavior: Behavior normal.     BP 136/72   Pulse (!) 55   Temp 97.8 F (36.6 C) (Tympanic)   Ht 6' (1.829 m)   Wt 242 lb (109.8 kg)   SpO2 95%   BMI 32.82 kg/m  Wt Readings from Last 3 Encounters:  12/29/19 242 lb (109.8 kg)  11/02/19 234 lb 12.8 oz (106.5 kg)  10/07/19 235 lb 6.4 oz (106.8 kg)     Health Maintenance Due  Topic Date Due  . INFLUENZA VACCINE  12/18/2019    There are no preventive care reminders to display for this patient.  Lab Results  Component Value Date   TSH 5.67 (H) 12/29/2019   Lab Results  Component Value Date   WBC 5.8 12/29/2019   HGB 14.2 12/29/2019   HCT 42.8 12/29/2019   MCV 96.3 12/29/2019   PLT 202.0 12/29/2019   Lab Results  Component Value Date   NA 141 12/29/2019   K 4.5 12/29/2019   CO2 34 (H) 12/29/2019   GLUCOSE 97 12/29/2019   BUN 27 (H) 12/29/2019   CREATININE 1.39 12/29/2019   BILITOT 0.5 12/29/2019   ALKPHOS 76 12/29/2019   AST 27 12/29/2019   ALT 26 12/29/2019  PROT 7.1 12/29/2019   ALBUMIN 4.2 12/29/2019   CALCIUM 9.3 12/29/2019   ANIONGAP 9 12/13/2016   GFR 49.74 (L) 12/29/2019   Lab Results  Component Value Date   CHOL 152 12/29/2019   Lab Results  Component Value Date   HDL 44.60 12/29/2019   Lab Results  Component Value Date   LDLCALC 70 12/29/2019   Lab Results  Component Value Date   TRIG 188.0 (H) 12/29/2019   Lab Results  Component  Value Date   CHOLHDL 3 12/29/2019   No results found for: HGBA1C    Assessment & Plan:   Problem List Items Addressed This Visit      Endocrine   Androgen deficiency   Relevant Orders   Testosterone (Completed)   Acquired hypothyroidism - Primary   Relevant Medications   levothyroxine (SYNTHROID) 112 MCG tablet   Other Relevant Orders   TSH (Completed)     Other   History of gout   Relevant Orders   Uric acid (Completed)   Elevated cholesterol   Relevant Orders   Comprehensive metabolic panel (Completed)   Lipid panel (Completed)   Healthcare maintenance   Relevant Orders   PSA (Completed)      Meds ordered this encounter  Medications  . levothyroxine (SYNTHROID) 112 MCG tablet    Sig: Take 1 tablet (112 mcg total) by mouth daily.    Dispense:  90 tablet    Refill:  0    Follow-up: Return in about 6 months (around 06/30/2020).   Continue current medicines above.  Will make needed adjustments pending laboratory results. [name], MD   8/13 addendum: increased levothyroxine to 136mcg. Fu in 2 months for repeat TSH.

## 2019-12-30 MED ORDER — LEVOTHYROXINE SODIUM 112 MCG PO TABS: 112.0000 ug | ORAL_TABLET | Freq: Every day | ORAL | 0 refills | Status: DC

## 2019-12-30 NOTE — Addendum Note (Signed)
Addended by: Jon Billings on: 12/30/2019 07:54 AM   Modules accepted: Orders

## 2020-01-06 ENCOUNTER — Ambulatory Visit: Payer: Medicare Other | Admitting: Rheumatology

## 2020-01-20 ENCOUNTER — Other Ambulatory Visit: Payer: Self-pay | Admitting: Family Medicine

## 2020-01-20 DIAGNOSIS — E291 Testicular hypofunction: Secondary | ICD-10-CM

## 2020-01-20 MED ORDER — TESTOSTERONE 50 MG/5GM (1%) TD GEL: TRANSDERMAL | 4 refills | Status: DC

## 2020-01-20 NOTE — Telephone Encounter (Signed)
Dennis Moon is calling and requesting a prescription for Testerone be sent to [facility], please advice. CB is [phone]

## 2020-01-20 NOTE — Telephone Encounter (Signed)
Refill request for pending medication. Last OV 12/29/19 please advise.

## 2020-02-03 DIAGNOSIS — K219 Gastro-esophageal reflux disease without esophagitis: Secondary | ICD-10-CM | POA: Diagnosis not present

## 2020-02-03 DIAGNOSIS — Z8371 Family history of colonic polyps: Secondary | ICD-10-CM | POA: Diagnosis not present

## 2020-02-03 DIAGNOSIS — Z955 Presence of coronary angioplasty implant and graft: Secondary | ICD-10-CM | POA: Diagnosis not present

## 2020-02-03 DIAGNOSIS — R1013 Epigastric pain: Secondary | ICD-10-CM | POA: Diagnosis not present

## 2020-02-03 DIAGNOSIS — I2510 Atherosclerotic heart disease of native coronary artery without angina pectoris: Secondary | ICD-10-CM | POA: Diagnosis not present

## 2020-02-14 DIAGNOSIS — Z01818 Encounter for other preprocedural examination: Secondary | ICD-10-CM | POA: Diagnosis not present

## 2020-02-17 DIAGNOSIS — Z8371 Family history of colonic polyps: Secondary | ICD-10-CM | POA: Diagnosis not present

## 2020-02-17 DIAGNOSIS — D126 Benign neoplasm of colon, unspecified: Secondary | ICD-10-CM | POA: Diagnosis not present

## 2020-02-17 DIAGNOSIS — K5730 Diverticulosis of large intestine without perforation or abscess without bleeding: Secondary | ICD-10-CM | POA: Diagnosis not present

## 2020-02-17 DIAGNOSIS — D123 Benign neoplasm of transverse colon: Secondary | ICD-10-CM | POA: Diagnosis not present

## 2020-02-17 DIAGNOSIS — K635 Polyp of colon: Secondary | ICD-10-CM | POA: Diagnosis not present

## 2020-02-17 DIAGNOSIS — Z1211 Encounter for screening for malignant neoplasm of colon: Secondary | ICD-10-CM | POA: Diagnosis not present

## 2020-02-17 DIAGNOSIS — K640 First degree hemorrhoids: Secondary | ICD-10-CM | POA: Diagnosis not present

## 2020-03-05 ENCOUNTER — Other Ambulatory Visit: Payer: Self-pay

## 2020-03-05 ENCOUNTER — Other Ambulatory Visit (INDEPENDENT_AMBULATORY_CARE_PROVIDER_SITE_OTHER): Payer: Medicare Other

## 2020-03-05 DIAGNOSIS — E039 Hypothyroidism, unspecified: Secondary | ICD-10-CM

## 2020-03-05 LAB — TSH: TSH: 3.47 u[IU]/mL (ref 0.35–4.50)

## 2020-03-05 MED ORDER — LEVOTHYROXINE SODIUM 112 MCG PO TABS: 112.0000 ug | ORAL_TABLET | Freq: Every day | ORAL | 2 refills | Status: DC

## 2020-03-05 NOTE — Addendum Note (Signed)
Addended by: Jon Billings on: 03/05/2020 01:27 PM   Modules accepted: Orders

## 2020-03-05 NOTE — Progress Notes (Signed)
Cardiology Office Note    Date:  03/09/2020   ID:  Dennis Moon, DOB 06-01-1945, MRN [id number]  PCP:  [name], MD  Cardiologist: Dr. Martinique  Chief Complaint  Patient presents with   Coronary Artery Disease    History of Present Illness:    Dennis Moon is a 74 y.o. male with past medical history of HTN, HLD, and Stage 3 CKD who is seen for follow up CAD. He is also followed by Rheumatology for gout, subacute cutaneous lupus erythematosus, Sjogren's, osteoarthritis, and DDD.   He was  admitted to [facility] from 7/26 - 12/13/2016 for evaluation of chest pain.   EKG was without acute changes but his troponin values peaked at 0.40. A cardiac catheterization was performed which showed 2-vessel obstructive CAD with 90% Prox-LAD stenosis and 80% Prox Cx stenosis. PCI was performed with placement of DES to both the LAD and LCx. He was started on DAPT with ASA and Brilinta along with BB and statin therapy.   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   He was seen in March 2021 after an episode of chest pain while eating cereal. A Myoview study was ordered and was normal. In retrospect he feels this was his reflux.   On follow up today he is doing very well. 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 He is exercising regularly. No chest pain, dyspnea, palpitations.   Past Medical History:  Diagnosis Date   CAD S/P percutaneous coronary angioplasty 12/12/2016   a. 11/2016: NSTEMI with DES to LAD and DES to LCx   Gout    Hypertension    Hypothyroidism    Migraine    Sjogren's disease ([place])    Systemic lupus erythematosus ([place])     Past Surgical History:  Procedure Laterality Date   CATARACT EXTRACTION, BILATERAL Bilateral 2017   CORONARY STENT INTERVENTION N/A 12/12/2016   Procedure: Coronary Stent Intervention;  Surgeon: Martinique, Audrena Talaga M, MD;  Location: [place] [facility];  Service: Cardiovascular;  Laterality: N/A;   HERNIA REPAIR     LEFT  HEART CATH AND CORONARY ANGIOGRAPHY N/A 12/12/2016   Procedure: Left Heart Cath and Coronary Angiography;  Surgeon: Martinique, Muriel Hannold M, MD;  Location: [place] [facility];  Service: Cardiovascular;  Laterality: N/A;   THYROID SURGERY  1997    Current Medications: Outpatient Medications Prior to Visit  Medication Sig Dispense Refill   acyclovir ointment (ZOVIRAX) 5 % Apply 1 application topically as needed. 15 g 0   allopurinol (ZYLOPRIM) 300 MG tablet Take 1 tablet (300 mg total) by mouth daily. 90 tablet 0   ALPHAGAN P 0.1 % SOLN Apply 1 drop to eye 2 (two) times daily.     Ascorbic Acid (VITAMIN C) 1000 MG tablet Take 1,000 mg by mouth daily.     aspirin-acetaminophen-caffeine (EXCEDRIN EXTRA STRENGTH) 250-250-65 MG tablet Take by mouth every 6 (six) hours as needed for headache.     atorvastatin (LIPITOR) 80 MG tablet Take 1 tablet (80 mg total) by mouth daily at 6 PM. (Patient taking differently: Take 40 mg by mouth daily at 6 PM. ) 90 tablet 3   Calcium Carb-Cholecalciferol (CALCIUM 600 + D PO) Take 1 tablet by mouth daily.     cholecalciferol (VITAMIN D) 25 MCG (1000 UNIT) tablet Take 1,000 Units by mouth daily.     Coenzyme Q10 300 MG CAPS Take 300 mg by mouth daily.     CRANBERRY EXTRACT PO Take 4,200  mg by mouth in the morning and at bedtime.      DULoxetine (CYMBALTA) 60 MG capsule TAKE 1 CAPSULE BY MOUTH DAILY 90 capsule 1   esomeprazole (NEXIUM 24HR) 20 MG capsule 20 mg 2 (two) times daily before a meal.  60 capsule 6   levocetirizine (XYZAL) 5 MG tablet Take 5 mg by mouth daily as needed for allergies.     levothyroxine (SYNTHROID) 112 MCG tablet Take 1 tablet (112 mcg total) by mouth daily. 90 tablet 2   magnesium oxide (MAG-OX) 400 MG tablet Take 400 mg by mouth daily.     Multiple Vitamins-Minerals (MULTIVITAMIN PO) Take 1 tablet by mouth daily.     nitroGLYCERIN (NITROSTAT) 0.4 MG SL tablet Place 1 tablet (0.4 mg total) under the tongue every 5 (five) minutes as  needed for chest pain. 25 tablet 2   Nutritional Supplements (GRAPESEED EXTRACT PO) Take 100 mg by mouth daily.     Probiotic Product (PROBIOTIC PO) Take 1 capsule by mouth daily.     propranolol (INDERAL) 60 MG tablet TAKE 1 TABLET(60 MG) BY MOUTH TWICE DAILY 180 tablet 4   testosterone (ANDROGEL) 50 MG/5GM (1%) GEL APPLY 5 GRAMS(1 PACKET) ONTO THE SKIN TWICE DAILY 300 g 4   TURMERIC PO Take 1 capsule by mouth in the morning and at bedtime.     zinc gluconate 50 MG tablet Take 50 mg by mouth daily.     No facility-administered medications prior to visit.     Allergies:   Bee venom   Social History   Socioeconomic History   Marital status: Married    Spouse name: Not on file   Number of children: Not on file   Years of education: Not on file   Highest education level: Not on file  Occupational History   Occupation: Dentist  Tobacco Use   Smoking status: Former Smoker    Packs/day: 1.00    Years: 6.00    Pack years: 6.00    Quit date: 04/25/1966    Years since quitting: 53.9   Smokeless tobacco: Never Used  Vaping Use   Vaping Use: Never used  Substance and Sexual Activity   Alcohol use: Yes    Alcohol/week: 7.0 standard drinks    Types: 7 Glasses of wine per week   Drug use: Never   Sexual activity: Not on file  Other Topics Concern   Not on file  Social History Narrative   Not on file   Social Determinants of Health   Financial Resource Strain: Low Risk    Difficulty of Paying Living Expenses: Not hard at all  Food Insecurity: No Food Insecurity   Worried About Charity fundraiser in the Last Year: Never true   [place] in the Last Year: Never true  Transportation Needs: No Transportation Needs   Lack of Transportation (Medical): No   Lack of Transportation (Non-Medical): No  Physical Activity: Inactive   Days of Exercise per Week: 0 days   Minutes of Exercise per Session: 0 min  Stress: No Stress Concern Present   Feeling of  Stress : Not at all  Social Connections: Moderately Integrated   Frequency of Communication with Friends and Family: More than three times a week   Frequency of Social Gatherings with Friends and Family: More than three times a week   Attends Religious Services: More than 4 times per year   Active Member of Clubs or Organizations: No   Attends [employer]  or Organization Meetings: Never   Marital Status: Married     Family History:  The patient's family history includes Diabetes in his mother; Hypertension in his father and mother.   Review of Systems:   Please see the history of present illness.     All other systems reviewed and are otherwise negative except as noted above.   Physical Exam:    VS:  BP (!) 144/68    Pulse 80    Ht 6' (1.829 m)    Wt 240 lb 3.2 oz (109 kg)    SpO2 96%    BMI 32.58 kg/m    GENERAL:  Well appearing, overweight WM in NAD HEENT:  PERRL, EOMI, sclera are clear. Oropharynx is clear. NECK:  No jugular venous distention, carotid upstroke brisk and symmetric, no bruits, no thyromegaly or adenopathy LUNGS:  Clear to auscultation bilaterally CHEST:  Unremarkable HEART:  RRR,  PMI not displaced or sustained,S1 and S2 within normal limits, no S3, no S4: no clicks, no rubs, no murmurs ABD:  Soft, nontender. BS +, no masses or bruits. No hepatomegaly, no splenomegaly EXT:  2 + pulses throughout, no edema, no cyanosis no clubbing SKIN:  Warm and dry.  No rashes NEURO:  Alert and oriented x 3. Cranial nerves II through XII intact. PSYCH:  Cognitively intact   Wt Readings from Last 3 Encounters:  03/09/20 240 lb 3.2 oz (109 kg)  12/29/19 242 lb (109.8 kg)  11/02/19 234 lb 12.8 oz (106.5 kg)      Studies/Labs Reviewed:   EKG:  EKG is not ordered today.    Recent Labs: 12/29/2019: ALT 26; BUN 27; Creatinine, Ser 1.39; Hemoglobin 14.2; Platelets 202.0; Potassium 4.5; Sodium 141 03/05/2020: TSH 3.47   Lipid Panel    Component Value Date/Time   CHOL 152  12/29/2019 0829   CHOL 119 06/17/2019 0930   TRIG 188.0 (H) 12/29/2019 0829   HDL 44.60 12/29/2019 0829   HDL 41 06/17/2019 0930   CHOLHDL 3 12/29/2019 0829   VLDL 37.6 12/29/2019 0829   LDLCALC 70 12/29/2019 0829   LDLCALC 58 06/17/2019 0930    Additional studies/ records that were reviewed today include:   Echocardiogram: 12/12/2016 Study Conclusions  - Left ventricle: The cavity size was normal. Wall thickness was   increased in a pattern of mild LVH. Systolic function was normal.   The estimated ejection fraction was in the range of 60% to 65%.   Wall motion was normal; there were no regional wall motion   abnormalities. Doppler parameters are consistent with abnormal   left ventricular relaxation (grade 1 diastolic dysfunction). The   E/e&' ratio is <8, suggesting normal LV filling pressure. - Mitral valve: Mildly thickened leaflets . There was trivial   regurgitation. - Left atrium: The atrium was normal in size. - Tricuspid valve: There was trivial regurgitation. - Pulmonic valve: There was mild regurgitation. - Pulmonary arteries: PA peak pressure: 20 mm Hg (S). - Inferior vena cava: The vessel was normal in size. The   respirophasic diameter changes were in the normal range (>= 50%),   consistent with normal central venous pressure.  Impressions:  - LVEF 60-65%, mild LVH, normal wall motion, grade 1 DD, normal LV   filling pressure, normal LA size, trivial MR, TR, RVSP 20 mmHg,   normal IVC.  Cardiac Catheterization: 12/12/2016  Mid LAD lesion, 40 %stenosed.  The left ventricular systolic function is normal.  LV end diastolic pressure is normal.  The left  ventricular ejection fraction is 55-65% by visual estimate.  Prox LAD lesion, 90 %stenosed.  A STENT PROMUS PREM MR 4.0X20 drug eluting stent was successfully placed.  Post intervention, there is a 0% residual stenosis.  Ost Cx to Prox Cx lesion, 80 %stenosed.  A STENT XIENCE ALPINE RX 3.0X18 drug  eluting stent was successfully placed.  Post intervention, there is a 0% residual stenosis.   1. 2 vessel obstructive CAD 2. Normal LV function 3. Normal LVEDP 4. Successful stenting of the proximal LAD with DES 5. Successful stenting of the proximal LCx with DES.  Plan: DAPT for one year. Anticipate DC in am.   Myoview 07/29/19: Study Highlights   The left ventricular ejection fraction is mildly decreased (45-54%).  Nuclear stress EF: 54%.  There was no ST segment deviation noted during stress.  The study is normal.  This is a low risk study.      Assessment:    1. Coronary artery disease involving native coronary artery of native heart without angina pectoris   2. Hyperlipidemia LDL goal <70   3. Stage 3 chronic kidney disease, unspecified whether stage 3a or 3b CKD ([place])   4. Essential hypertension      Plan:   In order of problems listed above:  1. CAD s/p NSTEMI - s/p NSTEMI in July 2018. S/p  DES to both the LAD and LCx. Echo showed a preserved EF of 60-65% with no regional WMA.  - He had one episode of chest pain in March. Myoview was normal. Resolved.  - on ASA monotherapy.  - follow up in 6 months.  2. HTN - well controlled. Continue Rx.   3. HLD - well controlled on high dose statin. LDL at goal. 58.  4. Stage 3 CKD - creatinine stable. Followed by Dr. Joelyn Oms.   Signed, Skylie Hiott Martinique, MD  03/09/2020 2:35 PM    [facility] [address], [place], [place] [address] Phone: [phone]

## 2020-03-09 ENCOUNTER — Other Ambulatory Visit: Payer: Self-pay

## 2020-03-09 ENCOUNTER — Encounter: Payer: Self-pay | Admitting: Cardiology

## 2020-03-09 ENCOUNTER — Ambulatory Visit: Payer: Medicare Other | Admitting: Cardiology

## 2020-03-09 VITALS — BP 144/68 | HR 80 | Ht 72.0 in | Wt 240.2 lb

## 2020-03-09 DIAGNOSIS — N183 Chronic kidney disease, stage 3 unspecified: Secondary | ICD-10-CM | POA: Diagnosis not present

## 2020-03-09 DIAGNOSIS — E785 Hyperlipidemia, unspecified: Secondary | ICD-10-CM

## 2020-03-09 DIAGNOSIS — I10 Essential (primary) hypertension: Secondary | ICD-10-CM | POA: Diagnosis not present

## 2020-03-09 DIAGNOSIS — I2510 Atherosclerotic heart disease of native coronary artery without angina pectoris: Secondary | ICD-10-CM | POA: Diagnosis not present

## 2020-03-15 ENCOUNTER — Telehealth: Payer: Self-pay | Admitting: Family Medicine

## 2020-03-15 NOTE — Telephone Encounter (Signed)
[facility] is calling to let the provider know that they do not have patients Cymbalta. Please give her a call back at [phone] and advise.

## 2020-03-16 MED ORDER — DULOXETINE HCL 60 MG PO CPEP: 60.0000 mg | ORAL_CAPSULE | Freq: Every day | ORAL | 1 refills | Status: DC

## 2020-03-16 NOTE — Telephone Encounter (Signed)
RX sent

## 2020-03-16 NOTE — Telephone Encounter (Signed)
Okay to refill? 

## 2020-03-16 NOTE — Telephone Encounter (Signed)
Last RX sent 09/02/19 #90 with 1 refill, please advise about refilling medication

## 2020-03-19 ENCOUNTER — Telehealth: Payer: Self-pay | Admitting: Pharmacist Clinician (PhC)/ Clinical Pharmacy Specialist

## 2020-03-19 MED ORDER — ROSUVASTATIN CALCIUM 40 MG PO TABS
40.0000 mg | ORAL_TABLET | Freq: Every day | ORAL | 3 refills | Status: DC
Start: 2020-03-19 — End: 2020-04-06

## 2020-03-19 NOTE — Telephone Encounter (Signed)
Pharmacy called, notes that patient having trouble swallowing atorvastatin 80 mg tablet.  Would like something smaller.    Reviewed chart. Will switch patient to rosuvastatin 40 mg daily.  Pharmacy to advise patient on medication change.

## 2020-03-23 NOTE — Progress Notes (Signed)
Office Visit Note  Patient: Dennis Moon             Date of Birth: [birth year]           MRN: [id number]             PCP: [name], MD Referring: [name],* Visit Date: 04/06/2020 Occupation: @GUAROCC @  Subjective:  Pain in joints.   History of Present Illness: Dennis Moon is a 74 y.o. male with history of Sjogren's syndrome, gout and osteoarthritis.  He states he has not had any skin lesions in a long time.  He uses sunscreen on a regular basis.  He continues to have dry mouth and dry eyes.  He states the symptoms are manageable with over-the-counter products.  He sees ophthalmologist on a regular basis.  He has not had any gout flares.  Activities of Daily Living:  Patient reports morning stiffness for all day. Patient Denies nocturnal pain.  Difficulty dressing/grooming: Denies Difficulty climbing stairs: Denies Difficulty getting out of chair: Denies Difficulty using hands for taps, buttons, cutlery, and/or writing: Denies  Review of Systems  Constitutional: Negative for fatigue.  HENT: Positive for mouth dryness. Negative for mouth sores and nose dryness.   Eyes: Positive for dryness. Negative for pain and itching.  Respiratory: Negative for shortness of breath and difficulty breathing.   Cardiovascular: Negative for chest pain and palpitations.  Gastrointestinal: Negative for blood in stool, constipation and diarrhea.  Endocrine: Negative for increased urination.  Genitourinary: Negative for difficulty urinating.  Musculoskeletal: Positive for arthralgias, joint pain, myalgias, morning stiffness, muscle tenderness and myalgias. Negative for joint swelling.  Skin: Negative for color change, rash and redness.  Allergic/Immunologic: Negative for susceptible to infections.  Neurological: Positive for headaches. Negative for dizziness, numbness, memory loss and weakness.  Hematological: Negative for bruising/bleeding tendency.    Psychiatric/Behavioral: Negative for confusion.    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) 04/23/2016   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   Idiopathic chronic gout, unspecified site, without tophus (tophi) 04/23/2016   High risk medication use 04/23/2016   Primary osteoarthritis of both hands 04/23/2016   DDD (degenerative disc disease), lumbar 04/23/2016   Kidney disease 04/23/2016    Past Medical History:  Diagnosis Date   CAD S/P percutaneous coronary angioplasty 12/12/2016   a. 11/2016: NSTEMI with DES to LAD and DES to LCx   Gout    Hypertension    Hypothyroidism    Migraine    Sjogren's disease ([place])    Systemic lupus erythematosus ([place])     Family History  Problem Relation Age of Onset   Diabetes Mother    Hypertension Mother    Hypertension Father    Healthy Son    Past Surgical History:  Procedure Laterality Date   CATARACT EXTRACTION, BILATERAL Bilateral 2017   COLONOSCOPY  2021   CORONARY STENT INTERVENTION N/A 12/12/2016   Procedure: Coronary Stent Intervention;  Surgeon: Martinique, Peter M, MD;  Location:  [place] [place];  Service: Cardiovascular;  Laterality: N/A;   HERNIA REPAIR     LEFT HEART CATH AND CORONARY ANGIOGRAPHY N/A 12/12/2016   Procedure: Left Heart Cath and Coronary Angiography;  Surgeon: Martinique, Peter M, MD;  Location: [place] [facility];  Service:  Cardiovascular;  Laterality: N/A;   THYROID SURGERY  1997   Social History   Social History Narrative   Not on file   Immunization History  Administered Date(s) Administered   Influenza-Unspecified 01/17/2017   PFIZER SARS-COV-2 Vaccination 06/17/2019, 07/08/2019, 02/16/2020   Pneumococcal Conjugate-13 10/21/2013   Pneumococcal Polysaccharide-23 08/02/2008, 03/14/2016   Tdap 07/18/2019   Zoster 08/28/2006   Zoster Recombinat (Shingrix) 11/12/2017     Objective: Vital Signs: BP 136/86 (BP Location: Left Arm, Patient Position: Sitting, Cuff Size: Normal)    Pulse 94    Resp 15    Ht 6' (1.829 m)    Wt 241 lb (109.3 kg)    BMI 32.69 kg/m    Physical Exam Vitals and nursing note reviewed.  Constitutional:      Appearance: He is well-developed.  HENT:     Head: Normocephalic and atraumatic.  Eyes:     Conjunctiva/sclera: Conjunctivae normal.     Pupils: Pupils are equal, round, and reactive to light.  Cardiovascular:     Rate and Rhythm: Normal rate and regular rhythm.     Heart sounds: Normal heart sounds.  Pulmonary:     Effort: Pulmonary effort is normal.     Breath sounds: Normal breath sounds.  Abdominal:     General: Bowel sounds are normal.     Palpations: Abdomen is soft.  Musculoskeletal:     Cervical back: Normal range of motion and neck supple.  Skin:    General: Skin is warm and dry.     Capillary Refill: Capillary refill takes less than 2 seconds.  Neurological:     Mental Status: He is alert and oriented to person, place, and time.  Psychiatric:        Behavior: Behavior normal.      Musculoskeletal Exam: He has limited range of motion of the cervical spine.  Shoulder joints were in good range of motion.  He has discomfort range of motion of his right shoulder joint.  Elbow joints with good range of motion.  He has bilateral PIP and DIP thickening with no synovitis.  Hip joints and knee joints with good range of motion.  He had no tenderness over  ankles or MTPs.  CDAI Exam: CDAI Score: -- Patient Global: --; Provider Global: -- Swollen: --; Tender: -- Joint Exam 04/06/2020   No joint exam has been documented for this visit   There is currently no information documented on the homunculus. Go to the Rheumatology activity and complete the homunculus joint exam.  Investigation: No additional findings.  Imaging: No results found.  Recent Labs: Lab Results  Component Value Date   WBC 5.8 12/29/2019   HGB 14.2 12/29/2019   PLT 202.0 12/29/2019   NA 141 12/29/2019   K 4.5 12/29/2019   CL 101 12/29/2019   CO2 34 (H) 12/29/2019   GLUCOSE 97 12/29/2019   BUN 27 (H) 12/29/2019   CREATININE 1.39 12/29/2019   BILITOT 0.5 12/29/2019   ALKPHOS 76 12/29/2019   AST 27 12/29/2019   ALT 26 12/29/2019   PROT 7.1 12/29/2019   ALBUMIN 4.2 12/29/2019   CALCIUM 9.3 12/29/2019   GFRAA 59 (L) 07/01/2019    Speciality Comments:  PLQ eye exam normal on 05/07/18  @ [facility].  Procedures:  No procedures performed Allergies: Bee venom   Assessment / Plan:     Visit Diagnoses: Sjogren's syndrome with other organ involvement ([place]) - Positive ANA, positive Ro, positive La. -he has sicca symptoms which are manageable with over-the-counter products.  He denies any shortness of breath.  We will get autoimmune labs today.  Plan: Sedimentation rate, ANA, Sjogrens syndrome-A extractable nuclear antibody, Sjogrens syndrome-B extractable nuclear antibody, C3 and C4, Anti-DNA antibody, double-stranded  Subacute cutaneous lupus erythematosus-patient denies any recurrence.  High risk medication use - d/c PLQ due to abnormal eye exam 11/2018.  Has been followed closely by ophthalmologist.  Idiopathic chronic gout of multiple sites without tophus - allopurinol 300mg  po qd. uric acid: 12/29/2019 5.1.  He denies any gout flare.  We will check uric acid again in February.  Acute pain of left shoulder - The x-ray showed acromioclavicular  arthritis. injected on 10/07/2019.  He has chronic discomfort.  Primary osteoarthritis of both hands-joint protection muscle strengthening was discussed  DDD (degenerative disc disease), lumbar-he continues to have some lower back pain which interferes with prolonged walking.  CAD S/P percutaneous coronary angioplasty-he is followed by cardiologist.  He believes that Lipitor caused some of the aches and pains.  History of chronic kidney disease - GFR is between 50 and 60. Followed by Dr. Joelyn Oms.  Orders: Orders Placed This Encounter  Procedures   Sedimentation rate   ANA   Sjogrens syndrome-A extractable nuclear antibody   Sjogrens syndrome-B extractable nuclear antibody   C3 and C4   Anti-DNA antibody, double-stranded   No orders of the defined types were placed in this encounter.     Follow-Up Instructions: Return in about 6 months (around 10/04/2020) for Sjogren's, Osteoarthritis, Gout.   Bo Merino, MD  Note - This record has been created using Editor, commissioning.  Chart creation errors have been sought, but may not always  have been located. Such creation errors do not reflect on  the standard of medical care.

## 2020-03-30 ENCOUNTER — Telehealth: Payer: Self-pay

## 2020-03-30 DIAGNOSIS — E7800 Pure hypercholesterolemia, unspecified: Secondary | ICD-10-CM

## 2020-03-30 DIAGNOSIS — I129 Hypertensive chronic kidney disease with stage 1 through stage 4 chronic kidney disease, or unspecified chronic kidney disease: Secondary | ICD-10-CM | POA: Diagnosis not present

## 2020-03-30 DIAGNOSIS — N183 Chronic kidney disease, stage 3 unspecified: Secondary | ICD-10-CM | POA: Diagnosis not present

## 2020-03-30 NOTE — Progress Notes (Addendum)
Chronic Care Management Pharmacy Moon   Name: Dennis Moon  MRN: [id number] DOB: 08/26/1945  Reason for [facility] Call.  PCP : Dennis Maw, MD  Allergies:   Allergies  Allergen Reactions   Bee Venom Swelling    Medications: Outpatient Encounter Medications as of 03/30/2020  Medication Sig   acyclovir ointment (ZOVIRAX) 5 % Apply 1 application topically as needed.   allopurinol (ZYLOPRIM) 300 MG tablet Take 1 tablet (300 mg total) by mouth daily.   ALPHAGAN P 0.1 % SOLN Apply 1 drop to eye 2 (two) times daily.   Ascorbic Acid (VITAMIN C) 1000 MG tablet Take 1,000 mg by mouth daily.   aspirin-acetaminophen-caffeine (EXCEDRIN EXTRA STRENGTH) 250-250-65 MG tablet Take by mouth every 6 (six) hours as needed for headache.   Calcium Carb-Cholecalciferol (CALCIUM 600 + D PO) Take 1 tablet by mouth daily.   cholecalciferol (VITAMIN D) 25 MCG (1000 UNIT) tablet Take 1,000 Units by mouth daily.   Coenzyme Q10 300 MG CAPS Take 300 mg by mouth daily.   CRANBERRY EXTRACT PO Take 4,200 mg by mouth in the morning and at bedtime.    DULoxetine (CYMBALTA) 60 MG capsule Take 1 capsule (60 mg total) by mouth daily.   esomeprazole (NEXIUM 24HR) 20 MG capsule 20 mg 2 (two) times daily before a meal.    levocetirizine (XYZAL) 5 MG tablet Take 5 mg by mouth daily as needed for allergies.   levothyroxine (SYNTHROID) 112 MCG tablet Take 1 tablet (112 mcg total) by mouth daily.   magnesium oxide (MAG-OX) 400 MG tablet Take 400 mg by mouth daily.   Multiple Vitamins-Minerals (MULTIVITAMIN PO) Take 1 tablet by mouth daily.   nitroGLYCERIN (NITROSTAT) 0.4 MG SL tablet Place 1 tablet (0.4 mg total) under the tongue every 5 (five) minutes as needed for chest pain.   Nutritional Supplements (GRAPESEED EXTRACT PO) Take 100 mg by mouth daily.   Probiotic Product (PROBIOTIC PO) Take 1 capsule by mouth daily.   propranolol (INDERAL) 60 MG tablet  TAKE 1 TABLET(60 MG) BY MOUTH TWICE DAILY   rosuvastatin (CRESTOR) 40 MG tablet Take 1 tablet (40 mg total) by mouth daily.   testosterone (ANDROGEL) 50 MG/5GM (1%) GEL APPLY 5 GRAMS(1 PACKET) ONTO THE SKIN TWICE DAILY   TURMERIC PO Take 1 capsule by mouth in the morning and at bedtime.   zinc gluconate 50 MG tablet Take 50 mg by mouth daily.   No facility-administered encounter medications on file as of 03/30/2020.    Current Diagnosis: Patient Active Problem List   Diagnosis Date Noted   History of gout 12/29/2019   Elevated cholesterol 12/29/2019   Healthcare maintenance 12/29/2019   Nonintractable headache 06/24/2019   Androgen deficiency 11/17/2017   Acquired hypothyroidism 11/17/2017   Cyst of tonsil 06/11/2017   Obesity (BMI 30.0-34.9) 03/12/2017   CAD S/P percutaneous coronary angioplasty 12/13/2016   NSTEMI (non-ST elevated myocardial infarction) (Dennis Moon) 12/11/2016   Gout of multiple sites 08/26/2016   Chronic renal impairment 08/26/2016   Chronic kidney disease 08/26/2016   Sjogren's syndrome ([place]) 04/23/2016   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  year old male who is a primary care patient of Dennis Maw, MD.  Comprehensive medication review performed; Spoke to patient regarding cholesterol  Lipid Panel    Component Value Date/Time   CHOL 152 12/29/2019 0829   CHOL 119 06/17/2019 0930   TRIG 188.0 (H) 12/29/2019 0829   HDL 44.60 12/29/2019 0829   HDL 41 06/17/2019 0930   LDLCALC 70 12/29/2019 0829   LDLCALC 58 06/17/2019 0930    10-year ASCVD risk score: The  ASCVD Risk score Dennis Moon., et al., 2013) failed to calculate for the following reasons:   The patient has a prior MI or stroke diagnosis   Current antihyperlipidemic regimen:  ? Atorvastatin 80 mg daily  Patient states he receives 30 day supply from his current pharmacy in packing. Patient reports he has "probably miss a dose but is unsure". Patient states he takes his atorvastatin at night at 7:00 pm.Patient denies any side effects from atorvastatin.  Previous antihyperlipidemic medications tried: None ID  ASCVD risk enhancing conditions: CKD  What recent interventions/DTPs have been made by any provider to improve Cholesterol control since last CPP Visit: None ID   Any recent hospitalizations or ED visits since last visit with CPP? No  What diet changes have been made to improve Cholesterol?  o Patient reports he eats out most of the time due to working to much.  What exercise is being done to improve Cholesterol?  o Patient states he gets very minium exercise.  Adherence Review: Does the patient have >5 day gap between last estimated fill dates? Yes  Dennis Moon [phone]   Addendum 04/06/20:  Patient was previously non-adherent to atorvastatin, possibly due to missing doses. [facility] now [facility] and has been filling his atorvastatin monthly. Anticipate his [place] improving going into 2022.   Dennis Moon Clinical Pharmacist [facility]  [phone]

## 2020-04-06 ENCOUNTER — Other Ambulatory Visit: Payer: Self-pay

## 2020-04-06 ENCOUNTER — Encounter: Payer: Self-pay | Admitting: Rheumatology

## 2020-04-06 ENCOUNTER — Ambulatory Visit: Payer: Medicare Other | Admitting: Rheumatology

## 2020-04-06 VITALS — BP 136/86 | HR 94 | Resp 15 | Ht 72.0 in | Wt 241.0 lb

## 2020-04-06 DIAGNOSIS — M1A09X0 Idiopathic chronic gout, multiple sites, without tophus (tophi): Secondary | ICD-10-CM

## 2020-04-06 DIAGNOSIS — M25512 Pain in left shoulder: Secondary | ICD-10-CM

## 2020-04-06 DIAGNOSIS — M3509 Sicca syndrome with other organ involvement: Secondary | ICD-10-CM

## 2020-04-06 DIAGNOSIS — M19042 Primary osteoarthritis, left hand: Secondary | ICD-10-CM

## 2020-04-06 DIAGNOSIS — Z79899 Other long term (current) drug therapy: Secondary | ICD-10-CM | POA: Diagnosis not present

## 2020-04-06 DIAGNOSIS — M19041 Primary osteoarthritis, right hand: Secondary | ICD-10-CM

## 2020-04-06 DIAGNOSIS — Z87448 Personal history of other diseases of urinary system: Secondary | ICD-10-CM

## 2020-04-06 DIAGNOSIS — L931 Subacute cutaneous lupus erythematosus: Secondary | ICD-10-CM | POA: Diagnosis not present

## 2020-04-06 DIAGNOSIS — I2510 Atherosclerotic heart disease of native coronary artery without angina pectoris: Secondary | ICD-10-CM

## 2020-04-06 DIAGNOSIS — M5136 Other intervertebral disc degeneration, lumbar region: Secondary | ICD-10-CM

## 2020-04-06 DIAGNOSIS — M51369 Other intervertebral disc degeneration, lumbar region without mention of lumbar back pain or lower extremity pain: Secondary | ICD-10-CM

## 2020-04-06 DIAGNOSIS — Z9861 Coronary angioplasty status: Secondary | ICD-10-CM

## 2020-04-06 NOTE — Patient Instructions (Signed)
   Labs - CBC, CMP and uric acid in February 2022

## 2020-04-09 LAB — ANTI-NUCLEAR AB-TITER (ANA TITER): ANA Titer 1: 1:1280 {titer} — ABNORMAL HIGH

## 2020-04-09 LAB — SJOGRENS SYNDROME-A EXTRACTABLE NUCLEAR ANTIBODY: SSA (Ro) (ENA) Antibody, IgG: >8 AI — AB

## 2020-04-09 LAB — SJOGRENS SYNDROME-B EXTRACTABLE NUCLEAR ANTIBODY: SSB (La) (ENA) Antibody, IgG: >8 AI — AB

## 2020-04-09 LAB — SEDIMENTATION RATE: Sed Rate: 14 mm/h (ref 0–20)

## 2020-04-09 LAB — ANA: Anti Nuclear Antibody (ANA): POSITIVE — AB

## 2020-04-09 LAB — C3 AND C4
C3 Complement: 150 mg/dL (ref 82–185)
C4 Complement: 30 mg/dL (ref 15–53)

## 2020-04-09 LAB — ANTI-DNA ANTIBODY, DOUBLE-STRANDED: ds DNA Ab: 1 IU/mL

## 2020-04-17 ENCOUNTER — Telehealth: Payer: Self-pay

## 2020-04-17 NOTE — Progress Notes (Signed)
Chronic Care Management Pharmacy Assistant   Name: Dennis Moon  MRN: [id number] DOB: 02-26-1946  Reason for Encounter: Medication Review    PCP : [name], MD  Allergies:   Allergies  Allergen Reactions  . Bee Venom Swelling    Medications: Outpatient Encounter Medications as of 04/17/2020  Medication Sig  . acyclovir ointment (ZOVIRAX) 5 % Apply 1 application topically as needed.  [name] allopurinol (ZYLOPRIM) 300 MG tablet Take 1 tablet (300 mg total) by mouth daily.  . ALPHAGAN P 0.1 % SOLN Apply 1 drop to eye 2 (two) times daily.  . Ascorbic Acid (VITAMIN C) 1000 MG tablet Take 1,000 mg by mouth daily.  [name] aspirin-acetaminophen-caffeine (EXCEDRIN EXTRA STRENGTH) 250-250-65 MG tablet Take by mouth every 6 (six) hours as needed for headache.  [name] atorvastatin (LIPITOR) 80 MG tablet Take 80 mg by mouth daily.  . Calcium Carb-Cholecalciferol (CALCIUM 600 + D PO) Take 1 tablet by mouth daily.  . cholecalciferol (VITAMIN D) 25 MCG (1000 UNIT) tablet Take 1,000 Units by mouth daily.  . Coenzyme Q10 300 MG CAPS Take 300 mg by mouth daily.  [name] CRANBERRY EXTRACT PO Take 4,200 mg by mouth in the morning and at bedtime.   . DULoxetine (CYMBALTA) 60 MG capsule Take 1 capsule (60 mg total) by mouth daily.  [name] esomeprazole (NEXIUM 24HR) 20 MG capsule 20 mg 2 (two) times daily before a meal.   . levocetirizine (XYZAL) 5 MG tablet Take 5 mg by mouth daily as needed for allergies.  [name] levothyroxine (SYNTHROID) 112 MCG tablet Take 1 tablet (112 mcg total) by mouth daily.  . nitroGLYCERIN (NITROSTAT) 0.4 MG SL tablet Place 1 tablet (0.4 mg total) under the tongue every 5 (five) minutes as needed for chest pain.  . Nutritional Supplements (GRAPESEED EXTRACT PO) Take 100 mg by mouth daily.  . Probiotic Product (PROBIOTIC PO) Take 1 capsule by mouth daily.  . propranolol (INDERAL) 60 MG tablet TAKE 1 TABLET(60 MG) BY MOUTH TWICE DAILY  . testosterone (ANDROGEL) 50 MG/5GM (1%) GEL APPLY  5 GRAMS(1 PACKET) ONTO THE SKIN TWICE DAILY  . TURMERIC PO Take 1 capsule by mouth in the morning and at bedtime.  [name] zinc gluconate 50 MG tablet Take 50 mg by mouth daily.   No facility-administered encounter medications on file as of 04/17/2020.    Current Diagnosis: Patient Active Problem List   Diagnosis Date Noted  . History of gout 12/29/2019  . Elevated cholesterol 12/29/2019  . Healthcare maintenance 12/29/2019  . Nonintractable headache 06/24/2019  . Androgen deficiency 11/17/2017  . Acquired hypothyroidism 11/17/2017  . Cyst of tonsil 06/11/2017  . Obesity (BMI 30.0-34.9) 03/12/2017  . CAD S/P percutaneous coronary angioplasty 12/13/2016  . NSTEMI (non-ST elevated myocardial infarction) ([place]) 12/11/2016  . Gout of multiple sites 08/26/2016  . Chronic renal impairment 08/26/2016  . Chronic kidney disease 08/26/2016  . Sjogren's syndrome ([place]) 04/23/2016  . 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 Idiopathic chronic gout, unspecified site, without tophus (tophi) 04/23/2016  . High risk medication use 04/23/2016  . Primary osteoarthritis of both hands 04/23/2016  . DDD (degenerative disc disease), lumbar 04/23/2016  . Kidney disease 04/23/2016     Follow-Up:  Pharmacist Review   Reviewed chart and adherence measures. Per insurance data patient is 73 % adherent to Atorvastatin . Patient does  not meet goal for Atorvastatin.On 03/19/2020 Cardiology change atorvastatin 80 MG tablet  to rosuvastatin 40 MG tablet daily due to having trouble swallowing medication  will notify Junius Argyle, Clinical pharmacist.   Anderson Malta Clinical Pharmacist Assistant [phone]

## 2020-04-23 ENCOUNTER — Telehealth: Payer: Self-pay

## 2020-04-23 ENCOUNTER — Other Ambulatory Visit: Payer: Self-pay

## 2020-04-23 NOTE — Telephone Encounter (Signed)
[facility] left a voicemail requesting prescription refill of Allopurinol for the patient.  They are requesting a 90 day supply.

## 2020-04-23 NOTE — Progress Notes (Signed)
Chronic Care Management Pharmacy Assistant   Name: Dennis Moon  MRN: [id number] DOB: Nov 04, 1945  Reason for Encounter: Medication Review  PCP : [name], MD  Allergies:   Allergies  Allergen Reactions  . Bee Venom Swelling    Medications: Outpatient Encounter Medications as of 04/23/2020  Medication Sig  . acyclovir ointment (ZOVIRAX) 5 % Apply 1 application topically as needed.  [name] allopurinol (ZYLOPRIM) 300 MG tablet Take 1 tablet (300 mg total) by mouth daily.  . ALPHAGAN P 0.1 % SOLN Apply 1 drop to eye 2 (two) times daily.  . Ascorbic Acid (VITAMIN C) 1000 MG tablet Take 1,000 mg by mouth daily.  [name] aspirin-acetaminophen-caffeine (EXCEDRIN EXTRA STRENGTH) 250-250-65 MG tablet Take by mouth every 6 (six) hours as needed for headache.  [name] atorvastatin (LIPITOR) 80 MG tablet Take 80 mg by mouth daily.  . Calcium Carb-Cholecalciferol (CALCIUM 600 + D PO) Take 1 tablet by mouth daily.  . cholecalciferol (VITAMIN D) 25 MCG (1000 UNIT) tablet Take 1,000 Units by mouth daily.  . Coenzyme Q10 300 MG CAPS Take 300 mg by mouth daily.  [name] CRANBERRY EXTRACT PO Take 4,200 mg by mouth in the morning and at bedtime.   . DULoxetine (CYMBALTA) 60 MG capsule Take 1 capsule (60 mg total) by mouth daily.  [name] esomeprazole (NEXIUM 24HR) 20 MG capsule 20 mg 2 (two) times daily before a meal.   . levocetirizine (XYZAL) 5 MG tablet Take 5 mg by mouth daily as needed for allergies.  [name] levothyroxine (SYNTHROID) 112 MCG tablet Take 1 tablet (112 mcg total) by mouth daily.  . nitroGLYCERIN (NITROSTAT) 0.4 MG SL tablet Place 1 tablet (0.4 mg total) under the tongue every 5 (five) minutes as needed for chest pain.  . Nutritional Supplements (GRAPESEED EXTRACT PO) Take 100 mg by mouth daily.  . Probiotic Product (PROBIOTIC PO) Take 1 capsule by mouth daily.  . propranolol (INDERAL) 60 MG tablet TAKE 1 TABLET(60 MG) BY MOUTH TWICE DAILY  . testosterone (ANDROGEL) 50 MG/5GM (1%) GEL APPLY 5  GRAMS(1 PACKET) ONTO THE SKIN TWICE DAILY  . TURMERIC PO Take 1 capsule by mouth in the morning and at bedtime.  [name] zinc gluconate 50 MG tablet Take 50 mg by mouth daily.   No facility-administered encounter medications on file as of 04/23/2020.    Current Diagnosis: Patient Active Problem List   Diagnosis Date Noted  . History of gout 12/29/2019  . Elevated cholesterol 12/29/2019  . Healthcare maintenance 12/29/2019  . Nonintractable headache 06/24/2019  . Androgen deficiency 11/17/2017  . Acquired hypothyroidism 11/17/2017  . Cyst of tonsil 06/11/2017  . Obesity (BMI 30.0-34.9) 03/12/2017  . CAD S/P percutaneous coronary angioplasty 12/13/2016  . NSTEMI (non-ST elevated myocardial infarction) ([place]) 12/11/2016  . Gout of multiple sites 08/26/2016  . Chronic renal impairment 08/26/2016  . Chronic kidney disease 08/26/2016  . Sjogren's syndrome ([place]) 04/23/2016  . 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 Idiopathic chronic gout, unspecified site, without tophus (tophi) 04/23/2016  . High risk medication use 04/23/2016  . Primary osteoarthritis of both hands 04/23/2016  . DDD (degenerative disc disease), lumbar 04/23/2016  . Kidney disease 04/23/2016     Follow-Up:  Pharmacist Review   Reviewed chart for medication changes ahead of medication coordination call.  OVs, Consults, or hospital visits since last care coordination call/Pharmacist visit.   04/06/2020 Rheumatology Shaili Deveshwar Medication changes indicated   03/19/2020 Cardiology switch atorvastatin 80 mg to rosuvastatin 40 mg daily.[name]  BP Readings from Last 3 Encounters:  04/06/20 136/86  03/09/20 (!) 144/68  12/29/19 136/72    No results found for: HGBA1C   Patient obtains medications through Adherence Packaging  30 Days   Last adherence delivery included: (medication name and frequency)  None ID Patient declined (meds) last month  due to PRN use/additional supply on hand.  None ID  Patient is due for next adherence delivery on: 05/02/2020. Called patient and reviewed medications and coordinated delivery.  This delivery to include:  Allopurinol 300 mg tablet daily - Evening meals  Alphagan 0.1% soln - Apply 1 drop to eye 2 (two) times daily  Duloxetine 60 mg Capsule daily - Evening Meals  Levothyroxine 100 mcg  Tablet Daily- Evening Meals   Propranolol 60 mg Tablet  Take 1 tablet by mouth twice Daily- Breakfast & Evening Meals  Atorvastatin 80 mg daily (patient only takes  tab)- Evening Meals   Patient declined the following medications (meds) due to (reason)  Vitamin C 1000 mg daily  -OTC  Excedrin Extra Strength  Every 6 hours PRN - Vial (OTC)  Calcium carbonate 600 mg-OTC  Vitamin D 1000 units  - OTC  CoQ10 300 mg  - OTC  Cranberry Extract 4200 mg - OTC  Esomeprazole 20 mg BID  - OTC  Xyzal 5 mg daily PRN - Vial -OTC  Magnesium Oxide 400 mg  - OTC  Centrum Silver Mens  - OTC  Nitroglycerin 0.4 mg SL  - PRN (adequate supply)  Grapeseed Extract  100 mg - OTC  Probiotic Daily- OTC  Testosterone 50mg /5g (1%) gel 1 packet BID  - (adequate supply)  Turmeric (unknown dose) - OTC  Zinc Gluconate 50 mg  - OTC  Patient needs refills for Allopurinol 300 mg tablet- Reached out to rheumatology to request refill on 04/23/2020 .  Confirmed delivery date of 05/02/2020, advised patient that pharmacy will contact them the morning of delivery.  [place] Pharmacist Assistant [phone]

## 2020-04-23 NOTE — Telephone Encounter (Signed)
Last Visit: 04/06/2020 Next Visit: 10/05/2020 Labs: 01/07/2020 CO2 34, BUN 27, GFR 49.74  Current Dose per office note 04/06/2020: - allopurinol 300mg  po qd DX: Sjogren's syndrome with other organ involvement   Okay to refill Allopurinol?

## 2020-04-24 MED ORDER — ALLOPURINOL 300 MG PO TABS
300.0000 mg | ORAL_TABLET | Freq: Every day | ORAL | 0 refills | Status: DC
Start: 2020-04-24 — End: 2020-06-19

## 2020-04-25 ENCOUNTER — Telehealth: Payer: Self-pay | Admitting: *Deleted

## 2020-04-25 NOTE — Telephone Encounter (Signed)
Results received from [facility] on 03/30/2020 Reviewed by Hazel Sams, PA-C  Glucose 106 BUN 28 GFR 59 CO2 33

## 2020-05-10 DIAGNOSIS — Z79899 Other long term (current) drug therapy: Secondary | ICD-10-CM | POA: Diagnosis not present

## 2020-05-21 ENCOUNTER — Telehealth: Payer: Self-pay

## 2020-05-21 NOTE — Progress Notes (Addendum)
Chronic Care Management Pharmacy Assistant   Name: Dennis Moon  MRN: [id number] DOB: 1945/06/05  Reason for Encounter: Medication Review  PCP : Mliss Sax, MD  Allergies:   Allergies  Allergen Reactions   Bee Venom Swelling    Medications: Outpatient Encounter Medications as of 05/21/2020  Medication Sig   acyclovir ointment (ZOVIRAX) 5 % Apply 1 application topically as needed.   allopurinol (ZYLOPRIM) 300 MG tablet Take 1 tablet (300 mg total) by mouth daily.   ALPHAGAN P 0.1 % SOLN Apply 1 drop to eye 2 (two) times daily.   Ascorbic Acid (VITAMIN C) 1000 MG tablet Take 1,000 mg by mouth daily.   aspirin-acetaminophen-caffeine (EXCEDRIN EXTRA STRENGTH) 250-250-65 MG tablet Take by mouth every 6 (six) hours as needed for headache.   atorvastatin (LIPITOR) 80 MG tablet Take 80 mg by mouth daily.   Calcium Carb-Cholecalciferol (CALCIUM 600 + D PO) Take 1 tablet by mouth daily.   cholecalciferol (VITAMIN D) 25 MCG (1000 UNIT) tablet Take 1,000 Units by mouth daily.   Coenzyme Q10 300 MG CAPS Take 300 mg by mouth daily.   CRANBERRY EXTRACT PO Take 4,200 mg by mouth in the morning and at bedtime.    DULoxetine (CYMBALTA) 60 MG capsule Take 1 capsule (60 mg total) by mouth daily.   esomeprazole (NEXIUM 24HR) 20 MG capsule 20 mg 2 (two) times daily before a meal.    levocetirizine (XYZAL) 5 MG tablet Take 5 mg by mouth daily as needed for allergies.   levothyroxine (SYNTHROID) 112 MCG tablet Take 1 tablet (112 mcg total) by mouth daily.   nitroGLYCERIN (NITROSTAT) 0.4 MG SL tablet Place 1 tablet (0.4 mg total) under the tongue every 5 (five) minutes as needed for chest pain.   Nutritional Supplements (GRAPESEED EXTRACT PO) Take 100 mg by mouth daily.   Probiotic Product (PROBIOTIC PO) Take 1 capsule by mouth daily.   propranolol (INDERAL) 60 MG tablet TAKE 1 TABLET(60 MG) BY MOUTH TWICE DAILY   testosterone (ANDROGEL) 50 MG/5GM (1%) GEL APPLY 5 GRAMS(1 PACKET) ONTO  THE SKIN TWICE DAILY   TURMERIC PO Take 1 capsule by mouth in the morning and at bedtime.   zinc gluconate 50 MG tablet Take 50 mg by mouth daily.   No facility-administered encounter medications on file as of 05/21/2020.    Current Diagnosis: Patient Active Problem List   Diagnosis Date Noted   History of gout 12/29/2019   Elevated cholesterol 12/29/2019   Healthcare maintenance 12/29/2019   Nonintractable headache 06/24/2019   Androgen deficiency 11/17/2017   Acquired hypothyroidism 11/17/2017   Cyst of tonsil 06/11/2017   Obesity (BMI 30.0-34.9) 03/12/2017   CAD S/P percutaneous coronary angioplasty 12/13/2016   NSTEMI (non-ST elevated myocardial infarction) ([facility]) 12/11/2016   Gout of multiple sites 08/26/2016   Chronic renal impairment 08/26/2016   Chronic kidney disease 08/26/2016   Sjogren's syndrome ([facility]) 04/23/2016   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  No OVs, Consults, or hospital visits since last care coordination call/Pharmacist visit.  No medication changes indicated   BP Readings from Last 3 Encounters:  04/06/20 136/86  03/09/20 (!) 144/68  12/29/19 136/72    No results found for: HGBA1C   Patient obtains medications through Adherence Packaging  30 Days   Last adherence delivery included:    Allopurinol 300 mg tablet daily - Evening meals             Alphagan 0.1% soln - Apply 1 drop to eye 2 (two) times daily             Duloxetine 60 mg Capsule daily - Evening Meals             Levothyroxine 100 mcg  Tablet Daily- Evening Meals              Propranolol 60 mg Tablet  Take 1 tablet by mouth twice Daily-  Breakfast & Evening Meals               Atorvastatin 80 mg daily (patient only takes  tab)- Evening Meals   Patient declined medication last month   Vitamin C 1000 mg daily  -OTC             Excedrin Extra Strength  Every 6 hours PRN - Vial (OTC)             Calcium carbonate 600 mg-OTC             Vitamin D 1000 units  - OTC             CoQ10 300 mg  - OTC             Cranberry Extract 4200 mg - OTC             Esomeprazole 20 mg BID  - OTC             Xyzal 5 mg daily PRN - Vial -OTC             Magnesium Oxide 400 mg  - OTC             Centrum Silver Mens  - OTC             Nitroglycerin 0.4 mg SL  - PRN (adequate supply)             Grapeseed Extract  100 mg - OTC             Probiotic Daily- OTC             Testosterone 50mg /5g (1%) gel 1 packet BID  - (adequate supply)             Turmeric (unknown dose) - OTC             Zinc Gluconate 50 mg  - OTC   Patient is due for next adherence delivery on: 05/28/2020. Called patient and reviewed medications and coordinated delivery.  This delivery to include:  Allopurinol 300 mg tablet daily - Evening meals             Alphagan 0.1% soln - Apply 1 drop to eye 2 (two) times daily             Duloxetine 60 mg Capsule daily - Evening Meals             Levothyroxine 112 mcg  Tablet Daily- Evening Meals              Propranolol 60 mg Tablet  Take 1 tablet by mouth twice Daily- Breakfast & Evening Meals               Atorvastatin  80 mg  Tablet daily - Evening Meals   Patient will not  need a short fill of medication, prior to adherence delivery.  Patient will not need a  acute fill of medication, prior to adherence delivery. .  Patient declined the following medications:   Vitamin C 1000 mg daily  -OTC             Excedrin Extra Strength  Every 6 hours PRN - Vial (OTC)             Calcium carbonate 600 mg-OTC             Vitamin D 1000 units  - OTC             CoQ10 300 mg  - OTC             Cranberry Extract 4200 mg - OTC              Esomeprazole 20 mg BID  - OTC             Xyzal 5 mg daily PRN - Vial -OTC             Magnesium Oxide 400 mg  - OTC             Centrum Silver Mens  - OTC             Nitroglycerin 0.4 mg SL  - PRN (adequate supply)             Grapeseed Extract  100 mg - OTC             Probiotic Daily- OTC             Testosterone 50mg /5g (1%) gel 1 packet BID  - (adequate supply)             Turmeric (unknown dose) - OTC             Zinc Gluconate 50 mg  - OTC  Patient needs refills for None ID .  Confirmed delivery date of 05/28/2020, advised patient that pharmacy will contact them the morning of delivery.  Follow-Up:  Pharmacist Review   Anderson Malta Clinical Pharmacist Assistant [phone]   9 minutes spent in review, coordination, and documentation.

## 2020-05-31 ENCOUNTER — Telehealth: Payer: Self-pay

## 2020-05-31 NOTE — Progress Notes (Signed)
Spoke to patient to confirmed patient telephone appointment on 06/01/2020 for CCM at 1:00 pm with Junius Argyle the Clinical pharmacist.   Patient wife states to call [phone] for his appointment.  [place] Pharmacist Assistant [phone]

## 2020-06-01 ENCOUNTER — Ambulatory Visit: Payer: Medicare Other

## 2020-06-01 DIAGNOSIS — E7800 Pure hypercholesterolemia, unspecified: Secondary | ICD-10-CM

## 2020-06-01 DIAGNOSIS — E039 Hypothyroidism, unspecified: Secondary | ICD-10-CM

## 2020-06-01 NOTE — Chronic Care Management (AMB) (Signed)
Chronic Care Management Pharmacy  Name: Dennis Moon  MRN: [id number] DOB: February 21, 1946   Chief Complaint/ HPI  Dennis Moon,  75 y.o. , male presents for their Follow-Up CCM visit with the clinical pharmacist via telephone.  PCP : [name], MD Patient Care Team: [name], MD as PCP - General (Family Medicine) Martinique, Peter M, MD as PCP - Cardiology (Cardiology) Germaine Pomfret, [facility] as Pharmacist (Pharmacist) Bo Merino, MD as Consulting Physician (Rheumatology)  Their chronic conditions include: Hyperlipidemia, GERD, Chronic Kidney Disease, Hypothyroidism, Osteoarthritis, Gout and Lupus and Sjogren's Syndrome   Office Visits: 12/29/19: Patient presented to Dr. Ethelene Hal for follow-up. Calcium stopped (pt not taking). TSH was elevated, levothyroxine increased to 112 mcg daily.  11/02/19: Patient presented to Caroleen Hamman, LPN for AWV. Patient referred to GI for colonoscopy  06/24/19: Patient presented to Dr. Ethelene Hal for follow-up. Testosterone levels low, no medication changes made.   Consult Visit: 04/06/20: Patient presented to Dr. Estanislado Pandy (Rheumatology) for Sjogren's syndrome. Magnesium, multivitamin, rosuvastatin stopped (pt not taking)  03/09/20: Patient presented to Dr. Martinique (cardiology) for follow-up. No medication changes made.    Patient currently works full time as a Pharmacist, community 4 days weekly. He is content with his health overall, although he wishes he could do more of the activities he used do to when he was younger (tennis, golf, skiing). He has not been able to do these activities due to ongoing physical limitations and muscular pain (primarily shoulder and back pain. When he is not working, he spends his time reading, exercising (walking, recumbunt bike that does not hurt shoulder), or watching tv. Currently he is working to transfer care of his Environmental consultant.   Allergies  Allergen Reactions  . Bee Venom Swelling     Medications: Outpatient Encounter Medications as of 06/01/2020  Medication Sig Note  . Ascorbic Acid (VITAMIN C) 1000 MG tablet Take 1,000 mg by mouth daily.   [name] aspirin-acetaminophen-caffeine (EXCEDRIN EXTRA STRENGTH) 250-250-65 MG tablet Take by mouth every 6 (six) hours as needed for headache.   . Calcium Carb-Cholecalciferol (CALCIUM 600 + D PO) Take 1 tablet by mouth daily.   . cholecalciferol (VITAMIN D) 25 MCG (1000 UNIT) tablet Take 1,000 Units by mouth daily.   . DULoxetine (CYMBALTA) 60 MG capsule Take 1 capsule (60 mg total) by mouth daily.   . fluorometholone (FML) 0.1 % ophthalmic suspension Place 1 drop into both eyes 2 (two) times daily.   [name] levothyroxine (SYNTHROID) 112 MCG tablet Take 1 tablet (112 mcg total) by mouth daily.   [name] allopurinol (ZYLOPRIM) 300 MG tablet Take 1 tablet (300 mg total) by mouth daily.   . ALPHAGAN P 0.1 % SOLN Apply 1 drop to eye 2 (two) times daily.   [name] atorvastatin (LIPITOR) 80 MG tablet Take 80 mg by mouth daily. 06/01/2020: Prescribed by Dr. Martinique  . Coenzyme Q10 300 MG CAPS Take 300 mg by mouth daily.   [name] CRANBERRY EXTRACT PO Take 4,200 mg by mouth in the morning and at bedtime.    [name] esomeprazole (NEXIUM 24HR) 20 MG capsule 20 mg 2 (two) times daily before a meal.    . levocetirizine (XYZAL) 5 MG tablet Take 5 mg by mouth daily as needed for allergies.   . nitroGLYCERIN (NITROSTAT) 0.4 MG SL tablet Place 1 tablet (0.4 mg total) under the tongue every 5 (five) minutes as needed for chest pain.   . Nutritional Supplements (GRAPESEED EXTRACT PO) Take 100 mg by  mouth daily.   . Probiotic Product (PROBIOTIC PO) Take 1 capsule by mouth daily.   . propranolol (INDERAL) 60 MG tablet TAKE 1 TABLET(60 MG) BY MOUTH TWICE DAILY   . testosterone (ANDROGEL) 50 MG/5GM (1%) GEL APPLY 5 GRAMS(1 PACKET) ONTO THE SKIN TWICE DAILY   . TURMERIC PO Take 1 capsule by mouth in the morning and at bedtime.   [name] zinc gluconate 50 MG tablet Take 50 mg by mouth daily.    . [DISCONTINUED] acyclovir ointment (ZOVIRAX) 5 % Apply 1 application topically as needed.    No facility-administered encounter medications on file as of 06/01/2020.   Current Diagnosis/Assessment:  SDOH Interventions   Flowsheet Row Most Recent Value  SDOH Interventions   Financial Strain Interventions Intervention Not Indicated  Transportation Interventions Intervention Not Indicated      Goals Addressed            This Visit's Progress   . Chronic Care Management       CARE PLAN ENTRY (see longitudinal plan of care for additional care plan information)  Current Barriers:  . Chronic Disease Management support, education, and care coordination needs related to Hyperlipidemia, GERD, Chronic Kidney Disease, Hypothyroidism, Osteoarthritis, Gout, Lupus and Sjogren's Syndrome    Hyperlipidemia Lab Results  Component Value Date/Time   LDLCALC 70 12/29/2019 08:29 AM   LDLCALC 58 06/17/2019 09:30 AM   . Pharmacist Clinical Goal(s): o Over the next 90 days, patient will work with PharmD and providers to maintain LDL goal < 70 . Current regimen:  o Atorvastatin 80 mg daily . Patient self care activities - Over the next 90 days, patient will: o Limit intake of red meat, greasy and fatty food, and eat more fresh fruits and vegetables  Hypothyroidism TSH  Date Value Ref Range Status  03/05/2020 3.47 0.35 - 4.50 uIU/mL Final .  Pharmacist Clinical Goal(s) o Over the next 90 days, patient will work with PharmD and providers to maintain stable thyroid function . Current regimen:  o Levothyroxine 112 mcg daily  . Interventions: o Levothyroxine works best when taken first thing in the morning at least 30 minutes prior to food or other medications.   Medication management . Pharmacist Clinical Goal(s): o Over the next 90 days, patient will work with PharmD and providers to maintain optimal medication adherence . Current pharmacy: [facility] . Interventions o Comprehensive  medication review performed. o Utilize [facility] pharmacy for medication synchronization, packaging and delivery . Patient self care activities - Over the next 90 days, patient will: o Take medications as prescribed o Report any questions or concerns to PharmD and/or provider(s)      Hyperlipidemia   Managed by Dr. Martinique History of NSTEMI, 2-vessel obstructive CAD s/p PCI (2018)  LDL goal < 70  Lipid Panel     Component Value Date/Time   CHOL 152 12/29/2019 0829   CHOL 119 06/17/2019 0930   TRIG 188.0 (H) 12/29/2019 0829   HDL 44.60 12/29/2019 0829   HDL 41 06/17/2019 0930   LDLCALC 70 12/29/2019 0829   LDLCALC 58 06/17/2019 0930    Hepatic Function Latest Ref Rng & Units 12/29/2019 07/01/2019 06/17/2019  Total Protein 6.0 - 8.3 g/dL 7.1 7.1 7.1  Albumin 3.5 - 5.2 g/dL 4.2 4.3 4.2  AST 0 - 37 U/L 27 31 27   ALT 0 - 53 U/L 26 36 25  Alk Phosphatase 39 - 117 U/L 76 87 85  Total Bilirubin 0.2 - 1.2 mg/dL 0.5 0.2 0.3  Bilirubin,  Direct 0.00 - 0.40 mg/dL - - 0.10    CMP Latest Ref Rng & Units 12/29/2019 07/01/2019 06/17/2019  Glucose 70 - 99 mg/dL 97 101(H) -  BUN 6 - 23 mg/dL 27(H) 20 -  Creatinine 0.40 - 1.50 mg/dL 1.39 1.35(H) -  Sodium 135 - 145 mEq/L 141 143 -  Potassium 3.5 - 5.1 mEq/L 4.5 4.2 -  Chloride 96 - 112 mEq/L 101 100 -  CO2 19 - 32 mEq/L 34(H) 28 -  Calcium 8.4 - 10.5 mg/dL 9.3 9.4 -  Total Protein 6.0 - 8.3 g/dL 7.1 7.1 7.1  Total Bilirubin 0.2 - 1.2 mg/dL 0.5 0.2 0.3  Alkaline Phos 39 - 117 U/L 76 87 85  AST 0 - 37 U/L 27 31 27   ALT 0 - 53 U/L 26 36 25   The ASCVD Risk score (Goff DC Jr., et al., 2013) failed to calculate for the following reasons:   The patient has a prior MI or stroke diagnosis   Patient has failed these meds in past: simvastatin Patient is currently controlled on the following medications:  . Atorvastatin 80 mg daily   We discussed:   Plan  Continue current medications   Hypothyroidism   Lab Results  Component Value  Date/Time   TSH 3.47 03/05/2020 08:05 AM   TSH 5.67 (H) 12/29/2019 08:29 AM   FREET4 0.89 12/12/2016 03:30 AM    Patient has failed these meds in past: n/a Patient is currently controlled on the following medications:  . Levothyroxine 112 mcg daily   We discussed: Patient does not separate from other medications/food (takes after dinner with evening medications) but las TSH was in range.   Plan  Continue current medications    Gout   Uric Acid, Serum  Date Value Ref Range Status  12/29/2019 5.1 4.0 - 7.8 mg/dL Final   Uric Acid  Date Value Ref Range Status  07/01/2019 5.5 3.8 - 8.4 mg/dL Final    Comment:               Therapeutic target for gout patients: <6.0  06/02/2019 5.4 3.8 - 8.4 mg/dL Final    Comment:               Therapeutic target for gout patients: <6.0     Goal Uric Acid < 6 mg/dL   Medications that may increase uric acid levels: Aspirin  Last gout flare: Patient unsure   Patient has failed these meds in past: n/a Patient is currently controlled on the following medications:  . Allopurinol 300 mg daily  We discussed:  Counseled patient on low purine diet plan. Counseled patient to reduce consumption of high-fructose corn syrup, sweetened soft drinks, fruit juices, meat, and seafood. Counseled patient to avoid alcohol consumption.   Plan  Continue current medications   History of headache / Migraine   Previously managed by neurology, does not have neurologist in [place].   Patient has failed these meds in past: n/a Patient is currently controlled on the following medications:  . Duloxetine 60 mg daily   . Excedrin extra strength q6hr PRN  . Propanolol 60 mg BID  We discussed:  Takes Excedrin (takes three times daily most days) to prevent headaches  Plan  Continue current medications  GERD   Patient has failed these meds in past: n/a Patient is currently controlled on the following medications:  . Esomeprazole 20 mg 2 cap nightly   . Alka Seltzer Chewables PRN   We discussed:  If he skips a dose, has bad rebound reflux. Previous endoscopy negative for Barret's Esophagus.   Plan  Continue current medications  Cutaneous Lupus   Managed by Dr. Estanislado Pandy  Patient has failed these meds in past: Plaquenil (abnormal eye exam 2020) Patient is currently controlled on the following medications:  . 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 He still has flares sporadically, but otherwise has not noticed new dermatological changes due to his Lupus.   Plan  Continue control with diet and exercise   Misc / OTC    . Alphagan 0.1% soln 1 drop BID  . Vitamin C 1000 mg daily  . Cranberry extract daily  4200 mg BID (kidneys) . Multivitamin daily (Centrum Silver)  . Nitroglycerin 0.4 mg SL PRN (only used once in last 2-3 years)  . Probiotic daily  . Testosterone 1%gel 1 pack BID  . Xyzal 5 mg daily PRN (takes ~once monthly)  . Vitamin D3 1000 units daily . Magnesium 400 mg daily . Zinc 50 mg daily . Calcium 600 mg BID  . CoQ10 300 mg daily  . Grapeseed 100 mg daily (antioxidant)   Vaccines   Reviewed and discussed patient's vaccination history.    Immunization History  Administered Date(s) Administered  . Influenza-Unspecified 01/17/2017  . PFIZER(Purple Top)SARS-COV-2 Vaccination 06/17/2019, 07/08/2019, 02/16/2020  . Pneumococcal Conjugate-13 10/21/2013  . Pneumococcal Polysaccharide-23 08/02/2008, 03/14/2016  . Tdap 07/18/2019  . Zoster 08/28/2006  . Zoster Recombinat (Shingrix) 11/12/2017   Medication Management   Pt uses [facility] for all medications.  Reviewed patient's [facility] medication and Epic medication profile assuring there are no discrepancies or gaps in therapy. Confirmed all fill dates appropriate and verified with patient that there is a sufficient quantity of all prescribed medications at home.  Informed patient to call me any time if needing medications before scheduled deliveries. The anticipated medication sync date is 06/21/20.  Follow up: 6 month phone visit  [facility]  [phone]

## 2020-06-07 NOTE — Patient Instructions (Signed)
Visit Information It was great speaking with you today!  Please let me know if you have any questions about our visit. Goals Addressed            This Visit's Progress   . Chronic Care Management       CARE PLAN ENTRY (see longitudinal plan of care for additional care plan information)  Current Barriers:  . Chronic Disease Management support, education, and care coordination needs related to Hyperlipidemia, GERD, Chronic Kidney Disease, Hypothyroidism, Osteoarthritis, Gout, Lupus and Sjogren's Syndrome    Hyperlipidemia Lab Results  Component Value Date/Time   LDLCALC 70 12/29/2019 08:29 AM   LDLCALC 58 06/17/2019 09:30 AM   . Pharmacist Clinical Goal(s): o Over the next 90 days, patient will work with PharmD and providers to maintain LDL goal < 70 . Current regimen:  o Atorvastatin 80 mg daily . Patient self care activities - Over the next 90 days, patient will: o Limit intake of red meat, greasy and fatty food, and eat more fresh fruits and vegetables  Hypothyroidism TSH  Date Value Ref Range Status  03/05/2020 3.47 0.35 - 4.50 uIU/mL Final .  Pharmacist Clinical Goal(s) o Over the next 90 days, patient will work with PharmD and providers to maintain stable thyroid function . Current regimen:  o Levothyroxine 112 mcg daily  . Interventions: o Levothyroxine works best when taken first thing in the morning at least 30 minutes prior to food or other medications.   Medication management . Pharmacist Clinical Goal(s): o Over the next 90 days, patient will work with PharmD and providers to maintain optimal medication adherence . Current pharmacy: [facility] . Interventions o Comprehensive medication review performed. o Utilize [facility] pharmacy for medication synchronization, packaging and delivery . Patient self care activities - Over the next 90 days, patient will: o Take medications as prescribed o Report any questions or concerns to PharmD and/or provider(s)      The  patient verbalized understanding of instructions, educational materials, and care plan provided today and declined offer to receive copy of patient instructions, educational materials, and care plan.   Telephone follow up appointment with pharmacy team member scheduled for: 11/16/20 at 1:00 PM  [facility]  [phone]

## 2020-06-08 ENCOUNTER — Telehealth: Payer: Self-pay

## 2020-06-08 NOTE — Chronic Care Management (AMB) (Signed)
06/08/2020- Patient contacted [facility] requesting refill of Testosterone 50 mg/5gm, acute fill form placed for delivery today. [facility] pharmacy aware and  Junius Argyle, CPP notified.  Pattricia Boss, [place] Pharmacist Assistant [phone]

## 2020-06-15 ENCOUNTER — Telehealth: Payer: Self-pay

## 2020-06-15 NOTE — Progress Notes (Signed)
Chronic Care Management Pharmacy Assistant   Name: Dennis Moon  MRN: [id number] DOB: 06/12/1945  Reason for Encounter: Medication Review  PCP : [name], MD  Allergies:   Allergies  Allergen Reactions  . Bee Venom Swelling    Medications: Outpatient Encounter Medications as of 06/15/2020  Medication Sig Note  . allopurinol (ZYLOPRIM) 300 MG tablet Take 1 tablet (300 mg total) by mouth daily.   . ALPHAGAN P 0.1 % SOLN Apply 1 drop to eye 2 (two) times daily.   . Ascorbic Acid (VITAMIN C) 1000 MG tablet Take 1,000 mg by mouth daily.   [name] aspirin-acetaminophen-caffeine (EXCEDRIN EXTRA STRENGTH) 250-250-65 MG tablet Take by mouth every 6 (six) hours as needed for headache.   [name] atorvastatin (LIPITOR) 80 MG tablet Take 80 mg by mouth daily. 06/01/2020: Prescribed by Dr. Martinique  . Calcium Carb-Cholecalciferol (CALCIUM 600 + D PO) Take 1 tablet by mouth daily.   . cholecalciferol (VITAMIN D) 25 MCG (1000 UNIT) tablet Take 1,000 Units by mouth daily.   . Coenzyme Q10 300 MG CAPS Take 300 mg by mouth daily.   [name] CRANBERRY EXTRACT PO Take 4,200 mg by mouth in the morning and at bedtime.    . DULoxetine (CYMBALTA) 60 MG capsule Take 1 capsule (60 mg total) by mouth daily.   [name] esomeprazole (NEXIUM 24HR) 20 MG capsule 20 mg 2 (two) times daily before a meal.    . fluorometholone (FML) 0.1 % ophthalmic suspension Place 1 drop into both eyes 2 (two) times daily.   [name] levocetirizine (XYZAL) 5 MG tablet Take 5 mg by mouth daily as needed for allergies.   [name] levothyroxine (SYNTHROID) 112 MCG tablet Take 1 tablet (112 mcg total) by mouth daily.   . nitroGLYCERIN (NITROSTAT) 0.4 MG SL tablet Place 1 tablet (0.4 mg total) under the tongue every 5 (five) minutes as needed for chest pain.   . Nutritional Supplements (GRAPESEED EXTRACT PO) Take 100 mg by mouth daily.   . Probiotic Product (PROBIOTIC PO) Take 1 capsule by mouth daily.   . propranolol (INDERAL) 60 MG tablet TAKE 1 TABLET(60  MG) BY MOUTH TWICE DAILY   . testosterone (ANDROGEL) 50 MG/5GM (1%) GEL APPLY 5 GRAMS(1 PACKET) ONTO THE SKIN TWICE DAILY   . TURMERIC PO Take 1 capsule by mouth in the morning and at bedtime.   [name] zinc gluconate 50 MG tablet Take 50 mg by mouth daily.    No facility-administered encounter medications on file as of 06/15/2020.    Current Diagnosis: Patient Active Problem List   Diagnosis Date Noted  . History of gout 12/29/2019  . Elevated cholesterol 12/29/2019  . Healthcare maintenance 12/29/2019  . Nonintractable headache 06/24/2019  . Androgen deficiency 11/17/2017  . Acquired hypothyroidism 11/17/2017  . Cyst of tonsil 06/11/2017  . Obesity (BMI 30.0-34.9) 03/12/2017  . CAD S/P percutaneous coronary angioplasty 12/13/2016  . NSTEMI (non-ST elevated myocardial infarction) ([place]) 12/11/2016  . Gout of multiple sites 08/26/2016  . Chronic renal impairment 08/26/2016  . Chronic kidney disease 08/26/2016  . Sjogren's syndrome ([place]) 04/23/2016  . 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 Idiopathic chronic gout, unspecified site, without tophus (tophi) 04/23/2016  . High risk medication use 04/23/2016  . Primary osteoarthritis of both hands 04/23/2016  . DDD (degenerative disc disease), lumbar 04/23/2016  . Kidney disease 04/23/2016    Goals Addressed   None    Reviewed chart and adherence measures. Per insurance data patient is 70-79 % non adherent  to atorvastatin . Patient does  not meet goal for atorvastatin, will notify Junius Argyle, Clinical pharmacist.    Follow-Up:  Pharmacist Review   [place] Clinical Pharmacist Assistant [phone]

## 2020-06-19 ENCOUNTER — Telehealth: Payer: Self-pay

## 2020-06-19 ENCOUNTER — Other Ambulatory Visit: Payer: Self-pay

## 2020-06-19 MED ORDER — ALLOPURINOL 300 MG PO TABS
300.0000 mg | ORAL_TABLET | Freq: Every day | ORAL | 0 refills | Status: DC
Start: 2020-06-19 — End: 2020-09-24

## 2020-06-19 NOTE — Progress Notes (Signed)
Chronic Care Management Pharmacy Assistant   Name: Dennis Moon  MRN: [id number] DOB: [birth year]  Reason for Encounter: Medication Review  Patient Questions:  1.  Have you seen any other providers since your last visit? No  2.  Any changes in your medicines or health? No   PCP : [name], MD  Allergies:   Allergies  Allergen Reactions  . Bee Venom Swelling    Medications: Outpatient Encounter Medications as of 06/19/2020  Medication Sig Note  . allopurinol (ZYLOPRIM) 300 MG tablet Take 1 tablet (300 mg total) by mouth daily.   . ALPHAGAN P 0.1 % SOLN Apply 1 drop to eye 2 (two) times daily.   . Ascorbic Acid (VITAMIN C) 1000 MG tablet Take 1,000 mg by mouth daily.   [name] aspirin-acetaminophen-caffeine (EXCEDRIN EXTRA STRENGTH) 250-250-65 MG tablet Take by mouth every 6 (six) hours as needed for headache.   [name] atorvastatin (LIPITOR) 80 MG tablet Take 80 mg by mouth daily. 06/01/2020: Prescribed by Dr. Martinique  . Calcium Carb-Cholecalciferol (CALCIUM 600 + D PO) Take 1 tablet by mouth daily.   . cholecalciferol (VITAMIN D) 25 MCG (1000 UNIT) tablet Take 1,000 Units by mouth daily.   . Coenzyme Q10 300 MG CAPS Take 300 mg by mouth daily.   [name] CRANBERRY EXTRACT PO Take 4,200 mg by mouth in the morning and at bedtime.    . DULoxetine (CYMBALTA) 60 MG capsule Take 1 capsule (60 mg total) by mouth daily.   [name] esomeprazole (NEXIUM 24HR) 20 MG capsule 20 mg 2 (two) times daily before a meal.    . fluorometholone (FML) 0.1 % ophthalmic suspension Place 1 drop into both eyes 2 (two) times daily.   [name] levocetirizine (XYZAL) 5 MG tablet Take 5 mg by mouth daily as needed for allergies.   [name] levothyroxine (SYNTHROID) 112 MCG tablet Take 1 tablet (112 mcg total) by mouth daily.   . nitroGLYCERIN (NITROSTAT) 0.4 MG SL tablet Place 1 tablet (0.4 mg total) under the tongue every 5 (five) minutes as needed for chest pain.   . Nutritional Supplements (GRAPESEED EXTRACT PO) Take 100 mg by  mouth daily.   . Probiotic Product (PROBIOTIC PO) Take 1 capsule by mouth daily.   . propranolol (INDERAL) 60 MG tablet TAKE 1 TABLET(60 MG) BY MOUTH TWICE DAILY   . testosterone (ANDROGEL) 50 MG/5GM (1%) GEL APPLY 5 GRAMS(1 PACKET) ONTO THE SKIN TWICE DAILY   . TURMERIC PO Take 1 capsule by mouth in the morning and at bedtime.   [name] zinc gluconate 50 MG tablet Take 50 mg by mouth daily.    No facility-administered encounter medications on file as of 06/19/2020.    Current Diagnosis: Patient Active Problem List   Diagnosis Date Noted  . History of gout 12/29/2019  . Elevated cholesterol 12/29/2019  . Healthcare maintenance 12/29/2019  . Nonintractable headache 06/24/2019  . Androgen deficiency 11/17/2017  . Acquired hypothyroidism 11/17/2017  . Cyst of tonsil 06/11/2017  . Obesity (BMI 30.0-34.9) 03/12/2017  . CAD S/P percutaneous coronary angioplasty 12/13/2016  . NSTEMI (non-ST elevated myocardial infarction) ([place]) 12/11/2016  . Gout of multiple sites 08/26/2016  . Chronic renal impairment 08/26/2016  . Chronic kidney disease 08/26/2016  . Sjogren's syndrome ([place]) 04/23/2016  . 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 Idiopathic chronic gout, unspecified site, without tophus (tophi) 04/23/2016  . High risk medication use 04/23/2016  . Primary osteoarthritis of both hands 04/23/2016  . DDD (degenerative disc disease), lumbar 04/23/2016  .  Kidney disease 04/23/2016    Goals Addressed   None    Reviewed chart for medication changes ahead of medication coordination call.  No OVs, Consults, or hospital visits since last care coordination call/Pharmacist visit. No medication changes indicated.  BP Readings from Last 3 Encounters:  04/06/20 136/86  03/09/20 (!) 144/68  12/29/19 136/72    No results found for: HGBA1C   Patient obtains medications through Adherence Packaging  30 Days   Last adherence delivery included:    Allopurinol 300 mgtabletdaily - Evening  meals Alphagan 0.1% soln-Apply 1 drop to eye 2 (two) times daily Duloxetine 60 mgCapsuledaily - Evening Meals Levothyroxine 112 mcgTablet Daily- Evening Meals Propranolol 60 mgTablet Take 1 tablet by mouth twice Daily- Breakfast &Evening Meals Atorvastatin 80 mg  Tablet daily - Evening Meals  Patient declined medication last month:   Vitamin C 1000 mg daily-OTC Excedrin Extra Strength Every 6 hours PRN -Vial (OTC) Calcium carbonate 600 mg-OTC Vitamin D 1000 units- OTC CoQ10 300 mg- OTC Cranberry Extract 4200 mg- OTC Esomeprazole 20 mg BID- OTC Xyzal 5 mg daily PRN - Vial-OTC Magnesium Oxide 400 mg- OTC Centrum Silver Mens- OTC Nitroglycerin 0.4 mg SL- PRN (adequate supply) Grapeseed Extract 100 mg - OTC Probiotic Daily- OTC Testosterone 50mg /5g (1%) gel 1 packet BID- (adequate supply) Turmeric (unknown dose)- OTC Zinc Gluconate 50 mg- OTC  Patient is due for next adherence delivery on: 06/27/2020. Called patient and reviewed medications and coordinated delivery.  This delivery to include:   Allopurinol 300 mgtabletdaily - Evening meals Alphagan 0.1% soln-Apply 1 drop to eye 2 (two) times daily Duloxetine 60 mgCapsuledaily - Evening Meals Levothyroxine 112 mcgTablet Daily- Evening Meals Propranolol 60 mgTablet Take 1 tablet by mouth twice Daily- Breakfast &Evening Meals Atorvastatin 80 mg  Tablet daily - Evening Meals  Patient will not  need a short fill of medication, prior to adherence delivery. Patient will not acute fill of medication, prior to adherence delivery.  Patient declined the following medications:  Vitamin C 1000 mg  daily-OTC Excedrin Extra Strength Every 6 hours PRN -Vial (OTC) Calcium carbonate 600 mg-OTC Vitamin D 1000 units- OTC CoQ10 300 mg- OTC Cranberry Extract 4200 mg- OTC Esomeprazole 20 mg BID- OTC Xyzal 5 mg daily PRN - Vial-OTC Magnesium Oxide 400 mg- OTC Centrum Silver Mens- OTC Nitroglycerin 0.4 mg SL- PRN (adequate supply) Grapeseed Extract 100 mg - OTC Probiotic Daily- OTC Testosterone 50mg /5g (1%) gel 1 packet BID- (adequate supply) Turmeric (unknown dose)- OTC Zinc Gluconate 50 mg- OTC   Patient needs refills for Allopurinol 300 mg reach out to Rheumatology to request refill on 06/19/2020  .  Confirmed delivery date of 06/27/2020, advised patient that pharmacy will contact them the morning of delivery.  Follow-Up:  Pharmacist Review   [place] Pharmacist Assistant [phone]

## 2020-06-19 NOTE — Telephone Encounter (Signed)
Spoke with pharmacy and advised it is too early to refill prescription. Pharmacist states the patient has 6 chronic medications that he is on and they would like to sync the prescriptions so they can be filled together. They are requesting an 10 day supply to complete the next fill and sync up his prescriptions. Please advise.   Last Visit: 04/06/2020 Next Visit: 10/05/2020 Labs: 03/30/2020 Glucose 106 BUN 28 GFR 59 CO2 33  Current Dose per office note 04/06/2020: - allopurinol 300mg  po qd

## 2020-06-19 NOTE — Telephone Encounter (Signed)
[facility] called requesting prescription refill of Allopurinol for the patient.

## 2020-07-10 ENCOUNTER — Telehealth: Payer: Self-pay

## 2020-07-10 ENCOUNTER — Telehealth (INDEPENDENT_AMBULATORY_CARE_PROVIDER_SITE_OTHER): Payer: Medicare Other | Admitting: Family Medicine

## 2020-07-10 DIAGNOSIS — U071 COVID-19: Secondary | ICD-10-CM | POA: Diagnosis not present

## 2020-07-10 DIAGNOSIS — R5381 Other malaise: Secondary | ICD-10-CM | POA: Diagnosis not present

## 2020-07-10 DIAGNOSIS — R300 Dysuria: Secondary | ICD-10-CM

## 2020-07-10 DIAGNOSIS — R059 Cough, unspecified: Secondary | ICD-10-CM | POA: Diagnosis not present

## 2020-07-10 DIAGNOSIS — N3000 Acute cystitis without hematuria: Secondary | ICD-10-CM | POA: Diagnosis not present

## 2020-07-10 DIAGNOSIS — R52 Pain, unspecified: Secondary | ICD-10-CM

## 2020-07-10 NOTE — Progress Notes (Signed)
Virtual Visit via Video Note  I connected with   on 07/10/20 at  4:00 PM EST by a video enabled telemedicine application and verified that I am speaking with the correct person using two identifiers.  Location patient: home, [place] Location provider:work or home office Persons participating in the virtual visit: patient, provider, wife  I discussed the limitations of evaluation and management by telemedicine and the availability of in person appointments. The patient expressed understanding and agreed to proceed.   HPI:  Acute telemedicine visit for : -Onset: 4 days ago -Symptoms include: fatigue, body aches, feels tired, HA, sinus issues, dry cough, the last few days has also had burning with urination and urinary frequency, cloudy the last few days as well, mild abd discomfort with BM -home covid test was negative a few days ago, but he is concerned about covid -Denies: fevers, CP, SOB, inability to eat/drink/get out bed -Has tried:advil, tylenol -Pertinent past medical history: cutaneous lupus and sjogren's -Pertinent medication allergies: -COVID-19 vaccine status: x 3 doses  ROS: See pertinent positives and negatives per HPI.  Past Medical History:  Diagnosis Date  . CAD S/P percutaneous coronary angioplasty 12/12/2016   a. 11/2016: NSTEMI with DES to LAD and DES to LCx  . Gout   . Hypertension   . Hypothyroidism   . Migraine   . Sjogren's disease ([place])   . Systemic lupus erythematosus ([place])     Past Surgical History:  Procedure Laterality Date  . CATARACT EXTRACTION, BILATERAL Bilateral 2017  . COLONOSCOPY  2021  . CORONARY STENT INTERVENTION N/A 12/12/2016   Procedure: Coronary Stent Intervention;  Surgeon: Martinique, Peter M, MD;  Location: [facility];  Service: Cardiovascular;  Laterality: N/A;  . HERNIA REPAIR    . LEFT HEART CATH AND CORONARY ANGIOGRAPHY N/A 12/12/2016   Procedure: Left Heart Cath and Coronary Angiography;  Surgeon: Martinique, Peter M, MD;   Location: [facility];  Service: Cardiovascular;  Laterality: N/A;  . THYROID SURGERY  1997     Current Outpatient Medications:  .  allopurinol (ZYLOPRIM) 300 MG tablet, Take 1 tablet (300 mg total) by mouth daily., Disp: 100 tablet, Rfl: 0 .  ALPHAGAN P 0.1 % SOLN, Apply 1 drop to eye 2 (two) times daily., Disp: , Rfl:  .  Ascorbic Acid (VITAMIN C) 1000 MG tablet, Take 1,000 mg by mouth daily., Disp: , Rfl:  .  aspirin-acetaminophen-caffeine (EXCEDRIN EXTRA STRENGTH) 250-250-65 MG tablet, Take by mouth every 6 (six) hours as needed for headache., Disp: , Rfl:  .  atorvastatin (LIPITOR) 80 MG tablet, Take 80 mg by mouth daily., Disp: , Rfl:  .  Calcium Carb-Cholecalciferol (CALCIUM 600 + D PO), Take 1 tablet by mouth daily., Disp: , Rfl:  .  cholecalciferol (VITAMIN D) 25 MCG (1000 UNIT) tablet, Take 1,000 Units by mouth daily., Disp: , Rfl:  .  Coenzyme Q10 300 MG CAPS, Take 300 mg by mouth daily., Disp: , Rfl:  .  CRANBERRY EXTRACT PO, Take 4,200 mg by mouth in the morning and at bedtime. , Disp: , Rfl:  .  DULoxetine (CYMBALTA) 60 MG capsule, Take 1 capsule (60 mg total) by mouth daily., Disp: 90 capsule, Rfl: 1 .  esomeprazole (NEXIUM 24HR) 20 MG capsule, 20 mg 2 (two) times daily before a meal. , Disp: 60 capsule, Rfl: 6 .  fluorometholone (FML) 0.1 % ophthalmic suspension, Place 1 drop into both eyes 2 (two) times daily., Disp: , Rfl:  .  levocetirizine (  XYZAL) 5 MG tablet, Take 5 mg by mouth daily as needed for allergies., Disp: , Rfl:  .  levothyroxine (SYNTHROID) 112 MCG tablet, Take 1 tablet (112 mcg total) by mouth daily., Disp: 90 tablet, Rfl: 2 .  nitroGLYCERIN (NITROSTAT) 0.4 MG SL tablet, Place 1 tablet (0.4 mg total) under the tongue every 5 (five) minutes as needed for chest pain., Disp: 25 tablet, Rfl: 2 .  Nutritional Supplements (GRAPESEED EXTRACT PO), Take 100 mg by mouth daily., Disp: , Rfl:  .  Probiotic Product (PROBIOTIC PO), Take 1 capsule by mouth daily., Disp:  , Rfl:  .  propranolol (INDERAL) 60 MG tablet, TAKE 1 TABLET(60 MG) BY MOUTH TWICE DAILY, Disp: 180 tablet, Rfl: 4 .  testosterone (ANDROGEL) 50 MG/5GM (1%) GEL, APPLY 5 GRAMS(1 PACKET) ONTO THE SKIN TWICE DAILY, Disp: 300 g, Rfl: 4 .  TURMERIC PO, Take 1 capsule by mouth in the morning and at bedtime., Disp: , Rfl:  .  zinc gluconate 50 MG tablet, Take 50 mg by mouth daily., Disp: , Rfl:   EXAM:  VITALS per patient if applicable:  GENERAL: alert, oriented, appears well and in no acute distress  HEENT: atraumatic, conjunttiva clear, no obvious abnormalities on inspection of external nose and ears  NECK: normal movements of the head and neck  LUNGS: on inspection no signs of respiratory distress, breathing rate appears normal, no obvious gross SOB, gasping or wheezing  CV: no obvious cyanosis  MS: moves all visible extremities without noticeable abnormality  PSYCH/NEURO: pleasant and cooperative, no obvious depression or anxiety, speech and thought processing grossly intact  ASSESSMENT AND PLAN:  Discussed the following assessment and plan:  Dysuria  Malaise  Cough  Body aches  -we discussed possible serious and likely etiologies, options for evaluation and workup, limitations of telemedicine visit vs in person visit, treatment, treatment risks and precautions. Pt prefers to treat via telemedicine empirically rather than in person at this moment.  Has a number of symptoms of multiple organ systems.  After lengthy discussion of potential etiologies advised an in person evaluation at higher level of care would be best for urine studies, a good exam and repeat Covid testing/flu testing and possible other evaluation pending these results.  His primary care office did not have availability today.  Discussed various options and he prefers to go to an urgent care near where he lives.  Advised if Covid test is positive of treatment options/treatment window and advised to request referral  for treatment at the visit at the urgent care if he is interested in treatment.  I discussed the assessment and treatment plan with the patient. The patient was provided an opportunity to ask questions and all were answered. The patient agreed with the plan and demonstrated an understanding of the instructions.     Lucretia Kern, DO

## 2020-07-10 NOTE — Telephone Encounter (Signed)
Patient calling for advise on some odd symptoms.  Pt states that he started having symptoms Friday of, body aches, headache and had started taking Advil and Tylenol.  Pt said that he had took a home Covid test on Sunday which came back negative but now pt c/o frequent urination along w/a burning sensation during urination and very little coming out.  Pt would like to know what he should do from here and would like a call back today. I informed pt that I would send message back to doc of the day, as Dr. Ethelene Hal is ooo. Please advise. CB# [phone] or [phone] wife's number.

## 2020-07-10 NOTE — Telephone Encounter (Signed)
He needs an appt 

## 2020-07-10 NOTE — Telephone Encounter (Signed)
Will this be an in office appointment or virtual?

## 2020-07-10 NOTE — Patient Instructions (Signed)
Please go get checked out at the urgent care today as we discussed.    I hope you are feeling better soon!  It was nice to meet you today. I help [facility] out with telemedicine visits on Tuesdays and Thursdays and am available for visits on those days. If you have any concerns or questions following this visit please schedule a follow up visit with your Primary Care doctor or seek care at a local urgent care clinic to avoid delays in care.

## 2020-07-11 ENCOUNTER — Telehealth (HOSPITAL_COMMUNITY): Payer: Self-pay | Admitting: Pharmacist

## 2020-07-11 ENCOUNTER — Telehealth: Payer: Self-pay | Admitting: Family

## 2020-07-11 ENCOUNTER — Other Ambulatory Visit: Payer: Self-pay | Admitting: Family

## 2020-07-11 MED ORDER — BENZONATATE 100 MG PO CAPS: 200.0000 mg | ORAL_CAPSULE | Freq: Three times a day (TID) | ORAL | 0 refills | Status: DC | PRN

## 2020-07-11 MED ORDER — NIRMATRELVIR/RITONAVIR (PAXLOVID)TABLET: 2.0000 | ORAL_TABLET | Freq: Two times a day (BID) | ORAL | 0 refills | Status: AC

## 2020-07-11 MED FILL — BENZONATATE 100 MG CAPS: 100 | 3 days supply | Qty: 20 | Fill #0

## 2020-07-11 MED FILL — PAXLOVID 20 X 150 MG & 10 X: 20 X 150 MG | 5 days supply | Qty: 20 | Fill #0

## 2020-07-11 NOTE — Telephone Encounter (Signed)
Patient went to an UC last night.  Pt tested positive for Covid.  UC will handle pt needs further.  Thank you.

## 2020-07-11 NOTE — Telephone Encounter (Signed)
Patient was prescribed oral covid treatment Paxlovid and treatment note was reviewed. Medication has been received by [facility] and reviewed for appropriateness.  Drug Interactions or Dosage Adjustments Noted:  Due to GFR of 59, dose was decreased to the recommended renal dosing.  Patient is currently on Atorvastatin, this medication will be held during Paxlovid treatment and not restarted until 12 hours after last dose.  Delivery Method: Patient Pick Up Patient contacted for counseling on 07/11/20 and verbalized understanding.   Delivery or Pick-Up Date: 07/11/20   Deidre Ala 07/11/2020, 11:53 AM [place] Pharmacist Phone# [phone]

## 2020-07-11 NOTE — Telephone Encounter (Signed)
Called to discuss with patient about COVID-19 symptoms and the use of one of the available treatments for those with mild to moderate Covid symptoms and at a high risk of hospitalization.  Pt appears to qualify for outpatient treatment due to co-morbid conditions and/or a member of an at-risk group in accordance with the FDA Emergency Use Authorization.    Symptom onset: Unknown Vaccinated: Yes Booster? Yes Qualifiers: Age, cardiovascular, BMI  Unable to reach pt - Called, unable to leave VM as inbox full. Sent MyChart message.    Loel Dubonnet

## 2020-07-11 NOTE — Telephone Encounter (Signed)
either

## 2020-07-11 NOTE — Telephone Encounter (Signed)
Called x2 07/11/20 at 11:10am. Phone goes straight to VM - likely has call blocker in place. Sent MyChart message requesting he call my on-call number.   Loel Dubonnet, NP

## 2020-07-11 NOTE — Telephone Encounter (Signed)
Called to discuss with patient about COVID-19 symptoms and the use of one of the available treatments for those with mild to moderate Covid symptoms and at a high risk of hospitalization.  Pt appears to qualify for outpatient treatment due to co-morbid conditions and/or a member of an at-risk group in accordance with the FDA Emergency Use Authorization.    Symptom onset: 07/07/20 Negative by home test 07/07/20 Had a UTI at that time.  Positive by home test 07/08/20. He is a Pharmacist, community in [place].   Vaccinated: Yes Booster? Yes Qualifiers: age, BMI, cardiovascular  Outpatient Oral COVID Treatment Note  I connected with Dennis Moon on 07/11/2020/11:21 AM by telephone and verified that I am speaking with the correct person using two identifiers.  I discussed the limitations, risks, security, and privacy concerns of performing an evaluation and management service by telephone and the availability of in person appointments. I also discussed with the patient that there may be a patient responsible charge related to this service. The patient expressed understanding and agreed to proceed.  Patient location: [place] residence  Provider location: Home  Diagnosis: COVID-19 infection  Purpose of visit: Discussion of potential use of Molnupiravir or Paxlovid, a new treatment for mild to moderate COVID-19 viral infection in non-hospitalized patients.   Subjective: Patient is a 75 y.o. male who has been diagnosed with COVID 19 viral infection.  Their symptoms began on 07/07/20 with congestion.    Past Medical History:  Diagnosis Date  . CAD S/P percutaneous coronary angioplasty 12/12/2016   a. 11/2016: NSTEMI with DES to LAD and DES to LCx  . Gout   . Hypertension   . Hypothyroidism   . Migraine   . Sjogren's disease ([place])   . Systemic lupus erythematosus ([facility])     Allergies  Allergen Reactions  . Bee Venom Swelling     Current Outpatient Medications:  .  allopurinol (ZYLOPRIM) 300 MG tablet,  Take 1 tablet (300 mg total) by mouth daily., Disp: 100 tablet, Rfl: 0 .  ALPHAGAN P 0.1 % SOLN, Apply 1 drop to eye 2 (two) times daily., Disp: , Rfl:  .  Ascorbic Acid (VITAMIN C) 1000 MG tablet, Take 1,000 mg by mouth daily., Disp: , Rfl:  .  aspirin-acetaminophen-caffeine (EXCEDRIN EXTRA STRENGTH) 250-250-65 MG tablet, Take by mouth every 6 (six) hours as needed for headache., Disp: , Rfl:  .  atorvastatin (LIPITOR) 80 MG tablet, Take 80 mg by mouth daily., Disp: , Rfl:  .  Calcium Carb-Cholecalciferol (CALCIUM 600 + D PO), Take 1 tablet by mouth daily., Disp: , Rfl:  .  cholecalciferol (VITAMIN D) 25 MCG (1000 UNIT) tablet, Take 1,000 Units by mouth daily., Disp: , Rfl:  .  Coenzyme Q10 300 MG CAPS, Take 300 mg by mouth daily., Disp: , Rfl:  .  CRANBERRY EXTRACT PO, Take 4,200 mg by mouth in the morning and at bedtime. , Disp: , Rfl:  .  DULoxetine (CYMBALTA) 60 MG capsule, Take 1 capsule (60 mg total) by mouth daily., Disp: 90 capsule, Rfl: 1 .  esomeprazole (NEXIUM 24HR) 20 MG capsule, 20 mg 2 (two) times daily before a meal. , Disp: 60 capsule, Rfl: 6 .  fluorometholone (FML) 0.1 % ophthalmic suspension, Place 1 drop into both eyes 2 (two) times daily., Disp: , Rfl:  .  levocetirizine (XYZAL) 5 MG tablet, Take 5 mg by mouth daily as needed for allergies., Disp: , Rfl:  .  levothyroxine (SYNTHROID) 112 MCG tablet, Take 1 tablet (  112 mcg total) by mouth daily., Disp: 90 tablet, Rfl: 2 .  nitroGLYCERIN (NITROSTAT) 0.4 MG SL tablet, Place 1 tablet (0.4 mg total) under the tongue every 5 (five) minutes as needed for chest pain., Disp: 25 tablet, Rfl: 2 .  Nutritional Supplements (GRAPESEED EXTRACT PO), Take 100 mg by mouth daily., Disp: , Rfl:  .  Probiotic Product (PROBIOTIC PO), Take 1 capsule by mouth daily., Disp: , Rfl:  .  propranolol (INDERAL) 60 MG tablet, TAKE 1 TABLET(60 MG) BY MOUTH TWICE DAILY, Disp: 180 tablet, Rfl: 4 .  testosterone (ANDROGEL) 50 MG/5GM (1%) GEL, APPLY 5 GRAMS(1  PACKET) ONTO THE SKIN TWICE DAILY, Disp: 300 g, Rfl: 4 .  TURMERIC PO, Take 1 capsule by mouth in the morning and at bedtime., Disp: , Rfl:  .  zinc gluconate 50 MG tablet, Take 50 mg by mouth daily., Disp: , Rfl:   Objective: Patient appears/sounds well.  They are in no apparent distress.  Breathing is non labored.  Mood and behavior are normal.  Laboratory Data:  No results found for this or any previous visit (from the past 2160 hour(s)).   Assessment: 75 y.o. male with mild/moderate COVID 19 viral infection diagnosed on 07/08/20 at high risk for progression to severe COVID 19.  Plan:  This patient is a 75 y.o. male that meets the following criteria for Emergency Use Authorization of: Paxlovid 1. Age >12 yr AND > 40 kg 2. SARS-COV-2 positive test 3. Symptom onset < 5 days 4. Mild-to-moderate COVID disease with high risk for severe progression to hospitalization or death  I have spoken and communicated the following to the patient or parent/caregiver regarding: 1. Paxlovid is an unapproved drug that is authorized for use under an Emergency Use Authorization.  2. There are no adequate, approved, available products for the treatment of COVID-19 in adults who have mild-to-moderate COVID-19 and are at high risk for progressing to severe COVID-19, including hospitalization or death. 3. Other therapeutics are currently authorized. For additional information on all products authorized for treatment or prevention of COVID-19, please see [employer].  4. There are benefits and risks of taking this treatment as outlined in the "Fact Sheet for Patients and Caregivers."  5. "Fact Sheet for Patients and Caregivers" was reviewed with patient. A hard copy will be provided to patient from pharmacy prior to the patient receiving treatment. 6. Patients should continue to self-isolate and use  infection control measures (e.g., wear mask, isolate, social distance, avoid sharing personal items, clean and disinfect "high touch" surfaces, and frequent handwashing) according to CDC guidelines.  7. The patient or parent/caregiver has the option to accept or refuse treatment. 8. Patient medication history was reviewed for potential drug interactions:Interaction with home meds: Atorvastatin - he will hold while taking Paxlovid 9. Patient's GFR was calculated to be 59 (by [facility] lab work 03/2020), and they were therefore prescribed Reduced dose (GFR 30-60) - nirmatrelvir 150mg  tab (1 tablet) by mouth twice daily AND ritonavir 100mg  tab (1 tablet) by mouth twice daily   After reviewing above information with the patient, the patient agrees to receive Paxlovid.  Follow up instructions:    . Take prescription BID x 5 days as directed . Reach out to pharmacist for counseling on medication if desired . For concerns regarding further COVID symptoms please follow up with your PCP or urgent care . For urgent or life-threatening issues, seek care at your local emergency department  The patient was provided an opportunity to ask questions,  and all were answered. The patient agreed with the plan and demonstrated an understanding of the instructions.   Script sent to [facility] and opted to pick up RX.   The patient was advised to call their PCP or seek an in-person evaluation if the symptoms worsen or if the condition fails to improve as anticipated.   I provided 15 minutes of non face-to-face telephone visit time during this encounter, and > 50% was spent counseling as documented under my assessment & plan.  Loel Dubonnet, NP 07/11/2020 /11:21 AM

## 2020-07-11 NOTE — Addendum Note (Signed)
Addended by: Loel Dubonnet on: 07/11/2020 11:35 AM   Modules accepted: Orders

## 2020-07-19 ENCOUNTER — Telehealth: Payer: Self-pay

## 2020-07-19 NOTE — Progress Notes (Signed)
Chronic Care Management Pharmacy Assistant   Name: NAZIM KADLEC  MRN: [id number] DOB: [birth year]  Reason for Encounter: Medication Review  Patient Questions:  1.  Have you seen any other providers since your last visit? No  2.  Any changes in your medicines or health? No   PCP : [name], MD  Allergies:   Allergies  Allergen Reactions  . Bee Venom Swelling    Medications: Outpatient Encounter Medications as of 07/19/2020  Medication Sig Note  . allopurinol (ZYLOPRIM) 300 MG tablet Take 1 tablet (300 mg total) by mouth daily.   . ALPHAGAN P 0.1 % SOLN Apply 1 drop to eye 2 (two) times daily.   . Ascorbic Acid (VITAMIN C) 1000 MG tablet Take 1,000 mg by mouth daily.   [name] aspirin-acetaminophen-caffeine (EXCEDRIN EXTRA STRENGTH) 250-250-65 MG tablet Take by mouth every 6 (six) hours as needed for headache.   [name] atorvastatin (LIPITOR) 80 MG tablet Take 80 mg by mouth daily. 06/01/2020: Prescribed by Dr. Martinique  . benzonatate (TESSALON PERLES) 100 MG capsule Take 2 capsules (200 mg total) by mouth 3 (three) times daily as needed for cough.   . Calcium Carb-Cholecalciferol (CALCIUM 600 + D PO) Take 1 tablet by mouth daily.   . cholecalciferol (VITAMIN D) 25 MCG (1000 UNIT) tablet Take 1,000 Units by mouth daily.   . Coenzyme Q10 300 MG CAPS Take 300 mg by mouth daily.   [name] CRANBERRY EXTRACT PO Take 4,200 mg by mouth in the morning and at bedtime.    . DULoxetine (CYMBALTA) 60 MG capsule Take 1 capsule (60 mg total) by mouth daily.   [name] esomeprazole (NEXIUM 24HR) 20 MG capsule 20 mg 2 (two) times daily before a meal.    . fluorometholone (FML) 0.1 % ophthalmic suspension Place 1 drop into both eyes 2 (two) times daily.   [name] levocetirizine (XYZAL) 5 MG tablet Take 5 mg by mouth daily as needed for allergies.   [name] levothyroxine (SYNTHROID) 112 MCG tablet Take 1 tablet (112 mcg total) by mouth daily.   . nitroGLYCERIN (NITROSTAT) 0.4 MG SL tablet Place 1 tablet (0.4 mg total)  under the tongue every 5 (five) minutes as needed for chest pain.   . Nutritional Supplements (GRAPESEED EXTRACT PO) Take 100 mg by mouth daily.   . Probiotic Product (PROBIOTIC PO) Take 1 capsule by mouth daily.   . propranolol (INDERAL) 60 MG tablet TAKE 1 TABLET(60 MG) BY MOUTH TWICE DAILY   . testosterone (ANDROGEL) 50 MG/5GM (1%) GEL APPLY 5 GRAMS(1 PACKET) ONTO THE SKIN TWICE DAILY   . TURMERIC PO Take 1 capsule by mouth in the morning and at bedtime.   [name] zinc gluconate 50 MG tablet Take 50 mg by mouth daily.    No facility-administered encounter medications on file as of 07/19/2020.    Current Diagnosis: Patient Active Problem List   Diagnosis Date Noted  . History of gout 12/29/2019  . Elevated cholesterol 12/29/2019  . Healthcare maintenance 12/29/2019  . Nonintractable headache 06/24/2019  . Androgen deficiency 11/17/2017  . Acquired hypothyroidism 11/17/2017  . Cyst of tonsil 06/11/2017  . Obesity (BMI 30.0-34.9) 03/12/2017  . CAD S/P percutaneous coronary angioplasty 12/13/2016  . NSTEMI (non-ST elevated myocardial infarction) ([place]) 12/11/2016  . Gout of multiple sites 08/26/2016  . Chronic renal impairment 08/26/2016  . Chronic kidney disease 08/26/2016  . Sjogren's syndrome ([place]) 04/23/2016  . 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 Idiopathic chronic gout, unspecified site, without tophus (tophi)  04/23/2016  . High risk medication use 04/23/2016  . Primary osteoarthritis of both hands 04/23/2016  . DDD (degenerative disc disease), lumbar 04/23/2016  . Kidney disease 04/23/2016    Goals Addressed   None    Reviewed chart for medication changes ahead of medication coordination call.  No OVs, Consults, or hospital visits since last care coordination call/Pharmacist visit.  No medication changes indicated   BP Readings from Last 3 Encounters:  04/06/20 136/86  03/09/20 (!) 144/68  12/29/19 136/72    No results found for: HGBA1C   Patient obtains medications through  Adherence Packaging  30 Days   Last adherence delivery included:   Allopurinol 300 mgtabletdaily - Evening meals Alphagan 0.1% soln-Apply 1 drop to eye 2 (two) times daily Duloxetine 60 mgCapsuledaily - Evening Meals Levothyroxine 1101mcgTablet Daily- Evening Meals Propranolol 60 mgTablet Take 1 tablet by mouth twice Daily- Breakfast &Evening Meals Atorvastatin 80 mgTabletdaily - Evening Meals  Patient declined medications last month   Vitamin C 1000 mg daily-OTC Excedrin Extra Strength Every 6 hours PRN -Vial (OTC) Calcium carbonate 600 mg-OTC Vitamin D 1000 units- OTC CoQ10 300 mg- OTC Cranberry Extract 4200 mg- OTC Esomeprazole 20 mg BID- OTC Xyzal 5 mg daily PRN - Vial-OTC Magnesium Oxide 400 mg- OTC Centrum Silver Mens- OTC Nitroglycerin 0.4 mg SL- PRN (adequate supply) Grapeseed Extract 100 mg - OTC Probiotic Daily- OTC Testosterone 50mg /5g (1%) gel 1 packet BID- (adequate supply) Turmeric (unknown dose)- OTC Zinc Gluconate 50 mg- OTC Patient is due for next adherence delivery on: 07/27/2020. Called patient and reviewed medications and coordinated delivery.  This delivery to include:  Allopurinol 300 mg One tabletdaily - Evening meals Alphagan 0.1% soln-Apply 1 drop to eye 2 (two) times daily Duloxetine 60 mgOne Capsuledaily - Evening Meals Levothyroxine 15mcgOne Tablet Daily- Evening Meals Propranolol 60 mgTablet Take 1 tablet by mouth twice Daily- Breakfast &Evening Meals Atorvastatin 80 mgOne Tabletdaily - Evening Meals  Patient will not  need a short fill of medication, prior to adherence delivery. Patient will  not need a  acute fill of medication, prior to adherence delivery.  Patient declined the following medications  Vitamin C 1000 mg daily-OTC Excedrin Extra Strength Every 6 hours PRN -Vial (OTC) Calcium carbonate 600 mg-OTC Vitamin D 1000 units- OTC CoQ10 300 mg- OTC Cranberry Extract 4200 mg- OTC Esomeprazole 20 mg BID- OTC Xyzal 5 mg daily PRN - Vial-OTC Magnesium Oxide 400 mg- OTC Centrum Silver Mens- OTC Nitroglycerin 0.4 mg SL- PRN (adequate supply) Grapeseed Extract 100 mg - OTC Probiotic Daily- OTC Testosterone 50mg /5g (1%) gel 1 packet BID- (adequate supply) Turmeric (unknown dose)- OTC Zinc Gluconate 50 mg- OTC   Patient needs refills for None ID .  Confirmed delivery date of 07/30/2020, advised patient that pharmacy will contact them the morning of delivery.   Follow-Up:  Pharmacist Review   Bessie Kapaa Pharmacist Assistant [phone]

## 2020-07-27 ENCOUNTER — Ambulatory Visit (HOSPITAL_BASED_OUTPATIENT_CLINIC_OR_DEPARTMENT_OTHER)
Admission: RE | Admit: 2020-07-27 | Discharge: 2020-07-27 | Disposition: A | Discharge status: Home / Self Care | Payer: Medicare Other | Source: Ambulatory Visit | Attending: Family Medicine | Admitting: Family Medicine

## 2020-07-27 ENCOUNTER — Encounter: Payer: Self-pay | Admitting: Family Medicine

## 2020-07-27 ENCOUNTER — Ambulatory Visit (INDEPENDENT_AMBULATORY_CARE_PROVIDER_SITE_OTHER): Payer: Medicare Other | Admitting: Family Medicine

## 2020-07-27 ENCOUNTER — Other Ambulatory Visit: Payer: Self-pay

## 2020-07-27 VITALS — BP 124/76 | HR 105 | Temp 97.7°F | Ht 72.0 in | Wt 239.4 lb

## 2020-07-27 DIAGNOSIS — M79604 Pain in right leg: Secondary | ICD-10-CM | POA: Diagnosis not present

## 2020-07-27 DIAGNOSIS — R600 Localized edema: Secondary | ICD-10-CM | POA: Insufficient documentation

## 2020-07-27 DIAGNOSIS — M79661 Pain in right lower leg: Secondary | ICD-10-CM | POA: Diagnosis not present

## 2020-07-27 LAB — BASIC METABOLIC PANEL
BUN: 24 mg/dL — ABNORMAL HIGH (ref 6–23)
CO2: 32 mEq/L (ref 19–32)
Calcium: 9 mg/dL (ref 8.4–10.5)
Chloride: 102 mEq/L (ref 96–112)
Creatinine, Ser: 1.27 mg/dL (ref 0.40–1.50)
GFR: 55.04 mL/min — ABNORMAL LOW (ref >60.00)
Glucose, Bld: 119 mg/dL — ABNORMAL HIGH (ref 70–99)
Potassium: 4.3 mEq/L (ref 3.5–5.1)
Sodium: 141 mEq/L (ref 135–145)

## 2020-07-27 LAB — CBC
HCT: 43.9 % (ref 39.0–52.0)
Hemoglobin: 14.4 g/dL (ref 13.0–17.0)
MCHC: 32.8 g/dL (ref 30.0–36.0)
MCV: 94.9 fl (ref 78.0–100.0)
Platelets: 260 10*3/uL (ref 150.0–400.0)
RBC: 4.63 Mil/uL (ref 4.22–5.81)
RDW: 14.7 % (ref 11.5–15.5)
WBC: 6.6 10*3/uL (ref 4.0–10.5)

## 2020-07-27 LAB — D-DIMER, QUANTITATIVE: D-Dimer, Quant: 0.79 mcg/mL FEU — ABNORMAL HIGH (ref <0.50)

## 2020-07-27 NOTE — Progress Notes (Addendum)
[facility] [address] [address] Dept: [phone] Dept Fax: [phone]  Acute Office Visit  Subjective:    Patient ID: Dennis Moon, male    DOB: 02-27-1946, 75 y.o..   MRN: [id number]  Chief Complaint  Patient presents with  . Acute Visit    C/o having pain in RT calf muscle since finishing antiviral covid medication 2 weeks ago.      History of Present Illness:  Patient is in today for evaluation of recent onset of right calf pain. He notes he was diagnosed with COVID-19 in late Feb. He did receive therapy with Paxlovid (5 day course). Around the time that he tested posiotive for COVID, he was also found to have a UTI and took a 2-week course of Cipro, which he completed yesterday. Last week, he developed acute onset of right calf pain. He notes this hurts worse with walking. He also admits that there has been some swelling of that leg. He denies any chest pain or shortness of breath. He has had some fatigue and poor appetitie since he had COVID. His wife encourages him to come in and be seen.  Past Medical History: Patient Active Problem List   Diagnosis Date Noted  . History of gout 12/29/2019  . Elevated cholesterol 12/29/2019  . Healthcare maintenance 12/29/2019  . Nonintractable headache 06/24/2019  . Androgen deficiency 11/17/2017  . Acquired hypothyroidism 11/17/2017  . Cyst of tonsil 06/11/2017  . Obesity (BMI 30.0-34.9) 03/12/2017  . CAD S/P percutaneous coronary angioplasty 12/13/2016  . NSTEMI (non-ST elevated myocardial infarction) ([facility]) 12/11/2016  . Gout of multiple sites 08/26/2016  . Chronic renal impairment 08/26/2016  . Chronic kidney disease 08/26/2016  . Sjogren's syndrome ([place]) 04/23/2016  . 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 Idiopathic chronic gout, unspecified site, without tophus (tophi) 04/23/2016  . High risk medication use 04/23/2016  . Primary osteoarthritis of both hands  04/23/2016  . DDD (degenerative disc disease), lumbar 04/23/2016  . Kidney disease 04/23/2016   Past Surgical History:  Procedure Laterality Date  . CATARACT EXTRACTION, BILATERAL Bilateral 2017  . COLONOSCOPY  2021  . CORONARY STENT INTERVENTION N/A 12/12/2016   Procedure: Coronary Stent Intervention;  Surgeon: Martinique, Peter M, MD;  Location: [facility];  Service: Cardiovascular;  Laterality: N/A;  . HERNIA REPAIR    . LEFT HEART CATH AND CORONARY ANGIOGRAPHY N/A 12/12/2016   Procedure: Left Heart Cath and Coronary Angiography;  Surgeon: Martinique, Peter M, MD;  Location: [facility];  Service: Cardiovascular;  Laterality: N/A;  . THYROID SURGERY  1997   Family History  Problem Relation Age of Onset  . Diabetes Mother   . Hypertension Mother   . Hypertension Father   . Healthy Son    Outpatient Medications Prior to Visit  Medication Sig Dispense Refill  . allopurinol (ZYLOPRIM) 300 MG tablet Take 1 tablet (300 mg total) by mouth daily. 100 tablet 0  . ALPHAGAN P 0.1 % SOLN Apply 1 drop to eye 2 (two) times daily.    . Ascorbic Acid (VITAMIN C) 1000 MG tablet Take 1,000 mg by mouth daily.    [name] aspirin-acetaminophen-caffeine (EXCEDRIN EXTRA STRENGTH) 250-250-65 MG tablet Take by mouth every 6 (six) hours as needed for headache.    [name] atorvastatin (LIPITOR) 80 MG tablet Take 80 mg by mouth daily.    . Calcium Carb-Cholecalciferol (CALCIUM 600 + D PO) Take 1 tablet by mouth daily.    [name]  cholecalciferol (VITAMIN D) 25 MCG (1000 UNIT) tablet Take 1,000 Units by mouth daily.    . Coenzyme Q10 300 MG CAPS Take 300 mg by mouth daily.    [name] CRANBERRY EXTRACT PO Take 4,200 mg by mouth in the morning and at bedtime.     . DULoxetine (CYMBALTA) 60 MG capsule Take 1 capsule (60 mg total) by mouth daily. 90 capsule 1  . esomeprazole (NEXIUM) 20 MG capsule 20 mg 2 (two) times daily before a meal.  60 capsule 6  . fluorometholone (FML) 0.1 % ophthalmic suspension Place 1 drop into both eyes  2 (two) times daily.    [name] levocetirizine (XYZAL) 5 MG tablet Take 5 mg by mouth daily as needed for allergies.    [name] levothyroxine (SYNTHROID) 112 MCG tablet Take 1 tablet (112 mcg total) by mouth daily. 90 tablet 2  . nitroGLYCERIN (NITROSTAT) 0.4 MG SL tablet Place 1 tablet (0.4 mg total) under the tongue every 5 (five) minutes as needed for chest pain. 25 tablet 2  . Nutritional Supplements (GRAPESEED EXTRACT PO) Take 100 mg by mouth daily.    . Probiotic Product (PROBIOTIC PO) Take 1 capsule by mouth daily.    . propranolol (INDERAL) 60 MG tablet TAKE 1 TABLET(60 MG) BY MOUTH TWICE DAILY 180 tablet 4  . testosterone (ANDROGEL) 50 MG/5GM (1%) GEL APPLY 5 GRAMS(1 PACKET) ONTO THE SKIN TWICE DAILY 300 g 4  . zinc gluconate 50 MG tablet Take 50 mg by mouth daily.    . benzonatate (TESSALON PERLES) 100 MG capsule Take 2 capsules (200 mg total) by mouth 3 (three) times daily as needed for cough. (Patient not taking: Reported on 07/27/2020) 20 capsule 0  . TURMERIC PO Take 1 capsule by mouth in the morning and at bedtime.     No facility-administered medications prior to visit.   Allergies  Allergen Reactions  . Bee Venom Swelling    Objective:   Today's Vitals   07/27/20 1008  BP: 124/76  Pulse: (!) 105  Temp: 97.7 F (36.5 C)  TempSrc: Temporal  SpO2: 95%  Weight: 239 lb 6.4 oz (108.6 kg)  Height: 6' (1.829 m)   Body mass index is 32.47 kg/m.   General: Well developed, well nourished. No acute distress. Lungs: Clear to auscultation bilaterally. CV: RRR without murmurs or rubs. Mild tachycardia. Pulses 2+ bilaterally. Extremities: There is pain on palpation over the right mid calf. There is 1-2 + pitting edema of the right lower leg with   only a trace present on the left. There is no sign of redness to the skin. Psych: Alert and oriented. Normal mood and affect.  There are no preventive care reminders to display for this patient.    Assessment & Plan:   1. Right calf  pain 2. Leg edema, right [name] has recent onset of right calf pain and leg swelling soon after having tested positive for COVID-19. I will order a D-dimer and an ultrasound of his leg today to evaluate for a possible DVT. I will follow-up with him on the results when available to discuss management or further evaluation.  - D-Dimer, Quantitative - CBC - Basic metabolic panel - US Venous Img Lower Unilateral Right; Future  Haydee Salter, MD   Addendum: Lab Results  Component Value Date   WBC 6.6 07/27/2020   HGB 14.4 07/27/2020   HCT 43.9 07/27/2020   MCV 94.9 07/27/2020   PLT 260.0 07/27/2020   D-dimer: 0.79 (H)  Right lower leg ultrasound: IMPRESSION: No evidence of deep venous thrombosis.  I called and discussed with [name]. I recommended he use heat over the calf and do stretches until this resolves. Follow-up as needed.  Haydee Salter, MD

## 2020-08-16 ENCOUNTER — Other Ambulatory Visit: Payer: Self-pay | Admitting: Family Medicine

## 2020-08-17 ENCOUNTER — Telehealth: Payer: Self-pay

## 2020-08-17 NOTE — Progress Notes (Signed)
Chronic Care Management Pharmacy Assistant   Name: Dennis Moon  MRN: [id number] DOB: 1945/10/18  Reason for Encounter: Medication Review/Medication Coordination Call.   Recent office visits:  07/27/2020 PCP [facility]  Recent consult visits:  None ID  Hospital visits:  None in previous 6 months  Medications: Outpatient Encounter Medications as of 08/17/2020  Medication Sig Note  . allopurinol (ZYLOPRIM) 300 MG tablet Take 1 tablet (300 mg total) by mouth daily.   . ALPHAGAN P 0.1 % SOLN Apply 1 drop to eye 2 (two) times daily.   . Ascorbic Acid (VITAMIN C) 1000 MG tablet Take 1,000 mg by mouth daily.   [name] aspirin-acetaminophen-caffeine (EXCEDRIN EXTRA STRENGTH) 250-250-65 MG tablet Take by mouth every 6 (six) hours as needed for headache.   [name] atorvastatin (LIPITOR) 80 MG tablet Take 80 mg by mouth daily. 06/01/2020: Prescribed by Dr. Martinique  . Calcium Carb-Cholecalciferol (CALCIUM 600 + D PO) Take 1 tablet by mouth daily.   . cholecalciferol (VITAMIN D) 25 MCG (1000 UNIT) tablet Take 1,000 Units by mouth daily.   . Coenzyme Q10 300 MG CAPS Take 300 mg by mouth daily.   [name] CRANBERRY EXTRACT PO Take 4,200 mg by mouth in the morning and at bedtime.    . DULoxetine (CYMBALTA) 60 MG capsule TAKE ONE CAPSULE BY MOUTH EVERY EVENING   . esomeprazole (NEXIUM) 20 MG capsule 20 mg 2 (two) times daily before a meal.    . fluorometholone (FML) 0.1 % ophthalmic suspension Place 1 drop into both eyes 2 (two) times daily.   [name] levocetirizine (XYZAL) 5 MG tablet Take 5 mg by mouth daily as needed for allergies.   [name] levothyroxine (SYNTHROID) 112 MCG tablet Take 1 tablet (112 mcg total) by mouth daily.   . nitroGLYCERIN (NITROSTAT) 0.4 MG SL tablet Place 1 tablet (0.4 mg total) under the tongue every 5 (five) minutes as needed for chest pain.   . Nutritional Supplements (GRAPESEED EXTRACT PO) Take 100 mg by mouth daily.   . Probiotic Product (PROBIOTIC PO) Take 1 capsule by mouth daily.    . propranolol (INDERAL) 60 MG tablet TAKE 1 TABLET(60 MG) BY MOUTH TWICE DAILY   . testosterone (ANDROGEL) 50 MG/5GM (1%) GEL APPLY 5 GRAMS(1 PACKET) ONTO THE SKIN TWICE DAILY   . zinc gluconate 50 MG tablet Take 50 mg by mouth daily.    No facility-administered encounter medications on file as of 08/17/2020.    Star Rating Drugs:atorvastatin 80 mg,  Reviewed chart for medication changes ahead of medication coordination call.  BP Readings from Last 3 Encounters:  07/27/20 124/76  04/06/20 136/86  03/09/20 (!) 144/68    No results found for: HGBA1C   Patient obtains medications through Adherence Packaging  30 Days   Last adherence delivery included:     Allopurinol 300 mg One tabletdaily - Evening meals Alphagan 0.1% soln-Apply 1 drop to eye 2 (two) times daily Duloxetine 60 mgOne Capsuledaily - Evening Meals Levothyroxine 120mcgOne Tablet Daily- Evening Meals Propranolol 60 mgTablet Take 1 tablet by mouth twice Daily- Breakfast &Evening Meals Atorvastatin 80 mgOne Tabletdaily - Evening Meals  Patient declined medications last month:    Vitamin C 1000 mg daily-OTC Excedrin Extra Strength Every 6 hours PRN -Vial (OTC) Calcium carbonate 600 mg-OTC Vitamin D 1000 units- OTC CoQ10 300 mg- OTC Cranberry Extract 4200 mg- OTC Esomeprazole 20 mg BID- OTC Xyzal 5 mg daily PRN - Vial-OTC Magnesium Oxide 400 mg- OTC Centrum Silver Mens- OTC Nitroglycerin  0.4 mg SL- PRN (adequate supply) Grapeseed Extract 100 mg - OTC Probiotic Daily- OTC Testosterone 50mg /5g (1%) gel 1 packet BID- (adequate supply) Turmeric (unknown dose)- OTC Zinc Gluconate 50 mg- OTC Patient is due for next adherence delivery on:  08/27/2020. Called patient and reviewed medications and coordinated delivery.  This delivery to include:  Allopurinol 300 mg One tabletdaily - Evening meals Alphagan 0.1% soln-Apply 1 drop to eye 2 (two) times daily Duloxetine 60 mgOne Capsuledaily - Evening Meals Levothyroxine 160mcgOne Tablet Daily- Evening Meals Propranolol 60 mgTablet Take 1 tablet by mouth twice Daily- Breakfast &Evening Meals Atorvastatin 80 mgOne Tabletdaily - Evening Meals  Patient declined the following medications:  Vitamin C 1000 mg daily-OTC Excedrin Extra Strength Every 6 hours PRN -Vial (OTC) Calcium carbonate 600 mg-OTC Vitamin D 1000 units- OTC CoQ10 300 mg- OTC Cranberry Extract 4200 mg- OTC Esomeprazole 20 mg BID- OTC Xyzal 5 mg daily PRN - Vial-OTC Magnesium Oxide 400 mg- OTC Centrum Silver Mens- OTC Nitroglycerin 0.4 mg SL- PRN (adequate supply) Grapeseed Extract 100 mg - OTC Probiotic Daily- OTC Testosterone 50mg /5g (1%) gel 1 packet BID- (adequate supply) Turmeric (unknown dose)- OTC Zinc Gluconate 50 mg- OTC   Patient needs refills for None ID.  Confirmed delivery date of 08/27/2020, advised patient that pharmacy will contact them the morning of delivery.  [place] Pharmacist Assistant [phone]

## 2020-09-07 ENCOUNTER — Encounter: Payer: Self-pay | Admitting: Family Medicine

## 2020-09-07 ENCOUNTER — Ambulatory Visit (INDEPENDENT_AMBULATORY_CARE_PROVIDER_SITE_OTHER): Payer: Medicare Other | Admitting: Family Medicine

## 2020-09-07 ENCOUNTER — Other Ambulatory Visit: Payer: Self-pay

## 2020-09-07 VITALS — BP 142/80 | HR 81 | Temp 96.9°F | Ht 72.0 in | Wt 242.2 lb

## 2020-09-07 DIAGNOSIS — R519 Headache, unspecified: Secondary | ICD-10-CM | POA: Diagnosis not present

## 2020-09-07 DIAGNOSIS — Z91030 Bee allergy status: Secondary | ICD-10-CM | POA: Diagnosis not present

## 2020-09-07 DIAGNOSIS — K219 Gastro-esophageal reflux disease without esophagitis: Secondary | ICD-10-CM

## 2020-09-07 DIAGNOSIS — E039 Hypothyroidism, unspecified: Secondary | ICD-10-CM | POA: Diagnosis not present

## 2020-09-07 DIAGNOSIS — Z8739 Personal history of other diseases of the musculoskeletal system and connective tissue: Secondary | ICD-10-CM

## 2020-09-07 DIAGNOSIS — E7800 Pure hypercholesterolemia, unspecified: Secondary | ICD-10-CM

## 2020-09-07 LAB — URINALYSIS, ROUTINE W REFLEX MICROSCOPIC
Bilirubin Urine: NEGATIVE
Hgb urine dipstick: NEGATIVE
Ketones, ur: NEGATIVE
Leukocytes,Ua: NEGATIVE
Nitrite: NEGATIVE
Specific Gravity, Urine: 1.025 (ref 1.000–1.030)
Urine Glucose: NEGATIVE
Urobilinogen, UA: 0.2 (ref 0.0–1.0)
pH: 5.5 (ref 5.0–8.0)

## 2020-09-07 LAB — COMPREHENSIVE METABOLIC PANEL
ALT: 29 U/L (ref 0–53)
AST: 24 U/L (ref 0–37)
Albumin: 3.8 g/dL (ref 3.5–5.2)
Alkaline Phosphatase: 88 U/L (ref 39–117)
BUN: 24 mg/dL — ABNORMAL HIGH (ref 6–23)
CO2: 35 mEq/L — ABNORMAL HIGH (ref 19–32)
Calcium: 8.8 mg/dL (ref 8.4–10.5)
Chloride: 101 mEq/L (ref 96–112)
Creatinine, Ser: 1.26 mg/dL (ref 0.40–1.50)
GFR: 55.51 mL/min — ABNORMAL LOW (ref >60.00)
Glucose, Bld: 91 mg/dL (ref 70–99)
Potassium: 4 mEq/L (ref 3.5–5.1)
Sodium: 141 mEq/L (ref 135–145)
Total Bilirubin: 0.5 mg/dL (ref 0.2–1.2)
Total Protein: 7.1 g/dL (ref 6.0–8.3)

## 2020-09-07 LAB — LIPID PANEL
Cholesterol: 124 mg/dL (ref 0–200)
HDL: 42.2 mg/dL (ref >39.00)
LDL Cholesterol: 57 mg/dL (ref 0–99)
NonHDL: 81.37
Total CHOL/HDL Ratio: 3
Triglycerides: 123 mg/dL (ref 0.0–149.0)
VLDL: 24.6 mg/dL (ref 0.0–40.0)

## 2020-09-07 LAB — URIC ACID: Uric Acid, Serum: 4.7 mg/dL (ref 4.0–7.8)

## 2020-09-07 LAB — CBC
HCT: 44.9 % (ref 39.0–52.0)
Hemoglobin: 14.7 g/dL (ref 13.0–17.0)
MCHC: 32.7 g/dL (ref 30.0–36.0)
MCV: 93.6 fl (ref 78.0–100.0)
Platelets: 172 10*3/uL (ref 150.0–400.0)
RBC: 4.8 Mil/uL (ref 4.22–5.81)
RDW: 15.2 % (ref 11.5–15.5)
WBC: 5.6 10*3/uL (ref 4.0–10.5)

## 2020-09-07 LAB — TSH: TSH: 2.56 u[IU]/mL (ref 0.35–4.50)

## 2020-09-07 MED ORDER — UBRELVY 50 MG PO TABS: ORAL_TABLET | ORAL | 2 refills | Status: DC

## 2020-09-07 MED ORDER — ESOMEPRAZOLE MAGNESIUM 20 MG PO CPDR: 20.0000 mg | DELAYED_RELEASE_CAPSULE | Freq: Two times a day (BID) | ORAL | 3 refills | Status: DC

## 2020-09-07 MED ORDER — EPINEPHRINE 0.3 MG/0.3ML IJ SOAJ: 0.3000 mg | INTRAMUSCULAR | 2 refills | Status: DC | PRN

## 2020-09-07 NOTE — Progress Notes (Signed)
Established Patient Office Visit  Subjective:  Patient ID: Dennis Moon, male    DOB: [birth year]  Age: 75 y.o. MRN: [id number]  CC:  Chief Complaint  Patient presents with  . Annual Exam    CPE, no concerns, patient fasting.     HPI HERSEY MACLELLAN presents for follow-up of elevated cholesterol, hypothyroidism, GERD, migraine headache, history of gout, history of hymenoptera allergy.  Continues high-dose atorvastatin for cholesterol without issue.  Taking 300 mg of allopurinol for gout.  Taking 112 mcg of levothyroxine for hypothyroidism.  Interested in trying [place] for headaches.  There is less risk for those with cardiovascular risk factors.  Renal function is sufficient.  Like to continue Nexium for GERD.  Symptoms are relieved with it.  Needs a refill for his EpiPen for bee sting allergy.  Past Medical History:  Diagnosis Date  . CAD S/P percutaneous coronary angioplasty 12/12/2016   a. 11/2016: NSTEMI with DES to LAD and DES to LCx  . Gout   . Hypertension   . Hypothyroidism   . Migraine   . Sjogren's disease ([place])   . Systemic lupus erythematosus ([place])     Past Surgical History:  Procedure Laterality Date  . CATARACT EXTRACTION, BILATERAL Bilateral 2017  . COLONOSCOPY  2021  . CORONARY STENT INTERVENTION N/A 12/12/2016   Procedure: Coronary Stent Intervention;  Surgeon: Martinique, Peter M, MD;  Location: [facility];  Service: Cardiovascular;  Laterality: N/A;  . HERNIA REPAIR    . LEFT HEART CATH AND CORONARY ANGIOGRAPHY N/A 12/12/2016   Procedure: Left Heart Cath and Coronary Angiography;  Surgeon: Martinique, Peter M, MD;  Location: [facility];  Service: Cardiovascular;  Laterality: N/A;  . THYROID SURGERY  1997    Family History  Problem Relation Age of Onset  . Diabetes Mother   . Hypertension Mother   . Hypertension Father   . Healthy Son     Social History   Socioeconomic History  . Marital status: Married    Spouse name: Not on file  .  Number of children: Not on file  . Years of education: Not on file  . Highest education level: Not on file  Occupational History  . Occupation: Pharmacist, community  Tobacco Use  . Smoking status: Former Smoker    Packs/day: 1.00    Years: 6.00    Pack years: 6.00    Quit date: 04/25/1966    Years since quitting: 54.4  . Smokeless tobacco: Never Used  Vaping Use  . Vaping Use: Never used  Substance and Sexual Activity  . Alcohol use: Yes    Alcohol/week: 7.0 standard drinks    Types: 7 Glasses of wine per week  . Drug use: Never  . Sexual activity: Not on file  Other Topics Concern  . Not on file  Social History Narrative  . Not on file   Social Determinants of Health   Financial Resource Strain: Low Risk   . Difficulty of Paying Living Expenses: Not hard at all  Food Insecurity: No Food Insecurity  . Worried About Charity fundraiser in the Last Year: Never true  . Ran Out of Food in the Last Year: Never true  Transportation Needs: No Transportation Needs  . Lack of Transportation (Medical): No  . Lack of Transportation (Non-Medical): No  Physical Activity: Inactive  . Days of Exercise per Week: 0 days  . Minutes of Exercise per Session: 0 min  Stress: No Stress Concern  Present  . Feeling of Stress : Not at all  Social Connections: Moderately Integrated  . Frequency of Communication with Friends and Family: More than three times a week  . Frequency of Social Gatherings with Friends and Family: More than three times a week  . Attends Religious Services: More than 4 times per year  . Active Member of Clubs or Organizations: No  . Attends Archivist Meetings: Never  . Marital Status: Married  Human resources officer Violence: Not on file    Outpatient Medications Prior to Visit  Medication Sig Dispense Refill  . allopurinol (ZYLOPRIM) 300 MG tablet Take 1 tablet (300 mg total) by mouth daily. 100 tablet 0  . ALPHAGAN P 0.1 % SOLN Apply 1 drop to eye 2 (two) times daily.    .  Ascorbic Acid (VITAMIN C) 1000 MG tablet Take 1,000 mg by mouth daily.    [name] aspirin-acetaminophen-caffeine (EXCEDRIN EXTRA STRENGTH) 250-250-65 MG tablet Take by mouth every 6 (six) hours as needed for headache.    [name] atorvastatin (LIPITOR) 80 MG tablet Take 80 mg by mouth daily.    . Calcium Carb-Cholecalciferol (CALCIUM 600 + D PO) Take 1 tablet by mouth daily.    . cholecalciferol (VITAMIN D) 25 MCG (1000 UNIT) tablet Take 1,000 Units by mouth daily.    . Coenzyme Q10 300 MG CAPS Take 300 mg by mouth daily.    [name] CRANBERRY EXTRACT PO Take 4,200 mg by mouth in the morning and at bedtime.     . DULoxetine (CYMBALTA) 60 MG capsule TAKE ONE CAPSULE BY MOUTH EVERY EVENING 90 capsule 0  . fluorometholone (FML) 0.1 % ophthalmic suspension Place 1 drop into both eyes 2 (two) times daily.    [name] levocetirizine (XYZAL) 5 MG tablet Take 5 mg by mouth daily as needed for allergies.    [name] levothyroxine (SYNTHROID) 112 MCG tablet Take 1 tablet (112 mcg total) by mouth daily. 90 tablet 2  . Nutritional Supplements (GRAPESEED EXTRACT PO) Take 100 mg by mouth daily.    . Probiotic Product (PROBIOTIC PO) Take 1 capsule by mouth daily.    . propranolol (INDERAL) 60 MG tablet TAKE 1 TABLET(60 MG) BY MOUTH TWICE DAILY 180 tablet 4  . testosterone (ANDROGEL) 50 MG/5GM (1%) GEL APPLY 5 GRAMS(1 PACKET) ONTO THE SKIN TWICE DAILY 300 g 4  . zinc gluconate 50 MG tablet Take 50 mg by mouth daily.    [name] esomeprazole (NEXIUM) 20 MG capsule 20 mg 2 (two) times daily before a meal.  60 capsule 6  . Nirmatrelvir & Ritonavir 20 x 150 MG & 10 x 100MG  TBPK TAKE 2 TABLETS BY MOUTH 2 TIMES DAILY FOR 5 DAYS 20 each 0  . nitroGLYCERIN (NITROSTAT) 0.4 MG SL tablet Place 1 tablet (0.4 mg total) under the tongue every 5 (five) minutes as needed for chest pain. (Patient not taking: Reported on 09/07/2020) 25 tablet 2   No facility-administered medications prior to visit.    Allergies  Allergen Reactions  . Bee Venom Swelling     ROS Review of Systems  Constitutional: Negative.   HENT: Negative.   Eyes: Negative for photophobia and visual disturbance.  Respiratory: Negative.   Cardiovascular: Negative.   Gastrointestinal: Negative.   Endocrine: Negative for polyphagia and polyuria.  Genitourinary: Negative.   Musculoskeletal: Negative.   Allergic/Immunologic: Negative for immunocompromised state.  Neurological: Positive for headaches. Negative for speech difficulty and weakness.  Psychiatric/Behavioral: Negative.       Objective:  Physical Exam Vitals and nursing note reviewed.  Constitutional:      General: He is not in acute distress.    Appearance: Normal appearance. He is not ill-appearing, toxic-appearing or diaphoretic.  HENT:     Head: Normocephalic and atraumatic.     Right Ear: Tympanic membrane, ear canal and external ear normal.     Left Ear: Tympanic membrane, ear canal and external ear normal.     Mouth/Throat:     Mouth: Mucous membranes are moist.     Pharynx: Oropharynx is clear. No oropharyngeal exudate or posterior oropharyngeal erythema.  Eyes:     General: No scleral icterus.       Right eye: No discharge.        Left eye: No discharge.     Extraocular Movements: Extraocular movements intact.     Conjunctiva/sclera: Conjunctivae normal.     Pupils: Pupils are equal, round, and reactive to light.  Cardiovascular:     Rate and Rhythm: Normal rate and regular rhythm.  Pulmonary:     Effort: Pulmonary effort is normal.     Breath sounds: Normal breath sounds.  Abdominal:     General: Bowel sounds are normal.  Musculoskeletal:     Cervical back: No rigidity or tenderness.  Lymphadenopathy:     Cervical: No cervical adenopathy.  Skin:    General: Skin is warm and dry.  Neurological:     Mental Status: He is alert and oriented to person, place, and time.  Psychiatric:        Mood and Affect: Mood normal.        Behavior: Behavior normal.     BP (!) 142/80    Pulse 81   Temp (!) 96.9 F (36.1 C) (Temporal)   Ht 6' (1.829 m)   Wt 242 lb 3.2 oz (109.9 kg)   SpO2 97%   BMI 32.85 kg/m  Wt Readings from Last 3 Encounters:  09/07/20 242 lb 3.2 oz (109.9 kg)  07/27/20 239 lb 6.4 oz (108.6 kg)  04/06/20 241 lb (109.3 kg)     There are no preventive care reminders to display for this patient.  There are no preventive care reminders to display for this patient.  Lab Results  Component Value Date   TSH 3.47 03/05/2020   Lab Results  Component Value Date   WBC 6.6 07/27/2020   HGB 14.4 07/27/2020   HCT 43.9 07/27/2020   MCV 94.9 07/27/2020   PLT 260.0 07/27/2020   Lab Results  Component Value Date   NA 141 07/27/2020   K 4.3 07/27/2020   CO2 32 07/27/2020   GLUCOSE 119 (H) 07/27/2020   BUN 24 (H) 07/27/2020   CREATININE 1.27 07/27/2020   BILITOT 0.5 12/29/2019   ALKPHOS 76 12/29/2019   AST 27 12/29/2019   ALT 26 12/29/2019   PROT 7.1 12/29/2019   ALBUMIN 4.2 12/29/2019   CALCIUM 9.0 07/27/2020   ANIONGAP 9 12/13/2016   GFR 55.04 (L) 07/27/2020   Lab Results  Component Value Date   CHOL 152 12/29/2019   Lab Results  Component Value Date   HDL 44.60 12/29/2019   Lab Results  Component Value Date   LDLCALC 70 12/29/2019   Lab Results  Component Value Date   TRIG 188.0 (H) 12/29/2019   Lab Results  Component Value Date   CHOLHDL 3 12/29/2019   No results found for: HGBA1C    Assessment & Plan:   Problem List Items Addressed This Visit  Digestive   Gastroesophageal reflux disease   Relevant Medications   esomeprazole (NEXIUM) 20 MG capsule     Endocrine   Hypothyroidism   Relevant Orders   TSH     Other   Nonintractable headache   Relevant Medications   Ubrogepant (UBRELVY) 50 MG TABS   History of gout   Relevant Orders   Urinalysis, Routine w reflex microscopic   Uric acid   Elevated cholesterol - Primary   Relevant Medications   EPINEPHrine 0.3 mg/0.3 mL IJ SOAJ injection   Other  Relevant Orders   CBC   Comprehensive metabolic panel   Lipid panel   Allergy to honey bee venom   Relevant Medications   EPINEPHrine 0.3 mg/0.3 mL IJ SOAJ injection      Meds ordered this encounter  Medications  . esomeprazole (NEXIUM) 20 MG capsule    Sig: Take 1 capsule (20 mg total) by mouth 2 (two) times daily before a meal.    Dispense:  90 capsule    Refill:  3  . Ubrogepant (UBRELVY) 50 MG TABS    Sig: Take one at first sign of migraine. May repeat once in 2 hours.    Dispense:  10 tablet    Refill:  2  . EPINEPHrine 0.3 mg/0.3 mL IJ SOAJ injection    Sig: Inject 0.3 mg into the muscle as needed for anaphylaxis.    Dispense:  1 each    Refill:  2    Follow-up: No follow-ups on file.    [name], MD

## 2020-09-09 NOTE — Progress Notes (Signed)
Uric acid is in desirable range.

## 2020-09-21 ENCOUNTER — Telehealth: Payer: Self-pay

## 2020-09-21 NOTE — Progress Notes (Signed)
Office Visit Note  Patient: Dennis Moon             Date of Birth: 25-Jan-1946           MRN: [id number]             PCP: [name], MD Referring: [name],* Visit Date: 10/05/2020 Occupation: @GUAROCC @  Subjective:  Medication management.   History of Present Illness: Dennis Moon is a 75 y.o. male with history of Sjogren's, osteoarthritis and gout.  He states he has not had any gout flares in a while.  He continues to have sicca symptoms for which she has been using over-the-counter products.  He has not had any recurrence of subcutaneous lupus rash.  He has been using sunscreen and wearing longsleeve clothes.  He continues to have some stiffness in his hands and lower back.  Activities of Daily Living:  Patient reports morning stiffness for 1  hours.   Patient Denies nocturnal pain.  Difficulty dressing/grooming: Denies Difficulty climbing stairs: Denies Difficulty getting out of chair: Denies Difficulty using hands for taps, buttons, cutlery, and/or writing: Denies  Review of Systems  Constitutional: Positive for fatigue. Negative for night sweats.  HENT: Positive for mouth dryness and nose dryness. Negative for mouth sores.   Eyes: Positive for dryness. Negative for pain, redness and itching.  Respiratory: Negative for shortness of breath and difficulty breathing.   Cardiovascular: Negative for chest pain, palpitations, hypertension, irregular heartbeat and swelling in legs/feet.  Gastrointestinal: Negative for blood in stool, constipation and diarrhea.  Endocrine: Negative for increased urination.  Genitourinary: Negative for difficulty urinating.  Musculoskeletal: Positive for arthralgias, joint pain, myalgias, morning stiffness and myalgias. Negative for joint swelling, muscle weakness and muscle tenderness.  Skin: Positive for color change. Negative for rash, hair loss, nodules/bumps, redness, skin tightness, ulcers and sensitivity to  sunlight.  Allergic/Immunologic: Negative for susceptible to infections.  Neurological: Positive for numbness and headaches. Negative for dizziness, fainting, memory loss, night sweats and weakness.  Hematological: Negative for bruising/bleeding tendency and swollen glands.  Psychiatric/Behavioral: Negative for depressed mood, confusion and sleep disturbance. The patient is not nervous/anxious.     PMFS History:  Patient Active Problem List   Diagnosis Date Noted  . Gastroesophageal reflux disease 09/07/2020  . Allergy to honey bee venom 09/07/2020  . History of gout 12/29/2019  . Elevated cholesterol 12/29/2019  . Healthcare maintenance 12/29/2019  . Nonintractable headache 06/24/2019  . Androgen deficiency 11/17/2017  . Hypothyroidism 11/17/2017  . Cyst of tonsil 06/11/2017  . Obesity (BMI 30.0-34.9) 03/12/2017  . CAD S/P percutaneous coronary angioplasty 12/13/2016  . NSTEMI (non-ST elevated myocardial infarction) ([place]) 12/11/2016  . Gout of multiple sites 08/26/2016  . Chronic renal impairment 08/26/2016  . Chronic kidney disease 08/26/2016  . Sjogren's syndrome ([place]) 04/23/2016  . 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 Idiopathic chronic gout, unspecified site, without tophus (tophi) 04/23/2016  . High risk medication use 04/23/2016  . Primary osteoarthritis of both hands 04/23/2016  . DDD (degenerative disc disease), lumbar 04/23/2016  . Kidney disease 04/23/2016    Past Medical History:  Diagnosis Date  . CAD S/P percutaneous coronary angioplasty 12/12/2016   a. 11/2016: NSTEMI with DES to LAD and DES to LCx  . Gout   . Hypertension   . Hypothyroidism   . Migraine   . Sjogren's disease ([place])   . Systemic lupus erythematosus ([facility])     Family History  Problem Relation Age of Onset  .  Diabetes Mother   . Hypertension Mother   . Hypertension Father   . Healthy Son    Past Surgical History:  Procedure Laterality Date  . CATARACT EXTRACTION, BILATERAL Bilateral 2017  .  COLONOSCOPY  2021  . CORONARY STENT INTERVENTION N/A 12/12/2016   Procedure: Coronary Stent Intervention;  Surgeon: Martinique, Peter M, MD;  Location: [facility];  Service: Cardiovascular;  Laterality: N/A;  . HERNIA REPAIR    . LEFT HEART CATH AND CORONARY ANGIOGRAPHY N/A 12/12/2016   Procedure: Left Heart Cath and Coronary Angiography;  Surgeon: Martinique, Peter M, MD;  Location: [facility];  Service: Cardiovascular;  Laterality: N/A;  . THYROID SURGERY  1997   Social History   Social History Narrative  . Not on file   Immunization History  Administered Date(s) Administered  . Influenza-Unspecified 02/24/2018, 02/04/2019, 02/16/2020  . PFIZER(Purple Top)SARS-COV-2 Vaccination 06/17/2019, 07/08/2019, 02/16/2020  . Pneumococcal Conjugate-13 10/21/2013  . Pneumococcal Polysaccharide-23 08/02/2008, 03/14/2016  . Tdap 07/18/2019  . Zoster 08/28/2006  . Zoster Recombinat (Shingrix) 11/12/2017     Objective: Vital Signs: BP 125/81 (BP Location: Left Arm, Patient Position: Sitting, Cuff Size: Normal)   Pulse 87   Resp 15   Ht 6' (1.829 m)   Wt 242 lb (109.8 kg)   BMI 32.82 kg/m    Physical Exam Vitals and nursing note reviewed.  Constitutional:      Appearance: He is well-developed.  HENT:     Head: Normocephalic and atraumatic.  Eyes:     Conjunctiva/sclera: Conjunctivae normal.     Pupils: Pupils are equal, round, and reactive to light.  Cardiovascular:     Rate and Rhythm: Normal rate and regular rhythm.     Heart sounds: Normal heart sounds.  Pulmonary:     Effort: Pulmonary effort is normal.     Breath sounds: Normal breath sounds.  Abdominal:     General: Bowel sounds are normal.     Palpations: Abdomen is soft.  Musculoskeletal:     Cervical back: Normal range of motion and neck supple.  Skin:    General: Skin is warm and dry.     Capillary Refill: Capillary refill takes less than 2 seconds.  Neurological:     Mental Status: He is alert and oriented  to person, place, and time.  Psychiatric:        Behavior: Behavior normal.      Musculoskeletal Exam: C-spine was in good range of motion.  He has some stiffness range of motion of his lumbar spine.  Shoulder joints, elbow joints and wrist joints with good range of motion.  He had bilateral PIP and DIP thickening with no synovitis.  Hip joints and knee joints with good range of motion.  He had no tenderness over ankles or MTPs.  CDAI Exam: CDAI Score: -- Patient Global: --; Provider Global: -- Swollen: --; Tender: -- Joint Exam 10/05/2020   No joint exam has been documented for this visit   There is currently no information documented on the homunculus. Go to the Rheumatology activity and complete the homunculus joint exam.  Investigation: No additional findings.  Imaging: No results found.  Recent Labs: Lab Results  Component Value Date   WBC 5.6 09/07/2020   HGB 14.7 09/07/2020   PLT 172.0 09/07/2020   NA 141 09/07/2020   K 4.0 09/07/2020   CL 101 09/07/2020   CO2 35 (H) 09/07/2020   GLUCOSE 91 09/07/2020   BUN 24 (H) 09/07/2020  CREATININE 1.26 09/07/2020   BILITOT 0.5 09/07/2020   ALKPHOS 88 09/07/2020   AST 24 09/07/2020   ALT 29 09/07/2020   PROT 7.1 09/07/2020   ALBUMIN 3.8 09/07/2020   CALCIUM 8.8 09/07/2020   GFRAA 59 (L) 07/01/2019    Speciality Comments: PLQ eye exam normal on 05/07/18  @ [facility].  Procedures:  No procedures performed Allergies: Bee venom   Assessment / Plan:     Visit Diagnoses: Sjogren's syndrome with other organ involvement ([place]) - Positive ANA, positive Ro, positive La. he continues to have sicca symptoms for which she has been using over-the-counter products which were reviewed.  He denies any shortness of breath or neuropathy.  Subacute cutaneous lupus erythematosus-he has had no recurrence of the rash.  He has been using sunscreen and using longsleeve shirts.  High risk medication use - d/c PLQ due to abnormal  eye exam 11/2018  Idiopathic chronic gout of multiple sites without tophus -he is on allopurinol 300mg  po qd. he has not had any gout flares since the last visit.  Uric acid was 4.7 on September 07, 2020.  Primary osteoarthritis of both hands-joint protection muscle strengthening was discussed.  DDD (degenerative disc disease), lumbar-he has discomfort off and on.  CAD S/P percutaneous coronary angioplasty  History of chronic kidney disease-he is followed by nephrologist.  Orders: No orders of the defined types were placed in this encounter.  No orders of the defined types were placed in this encounter.    Follow-Up Instructions: Return in about 6 months (around 04/07/2021) for Sjogren's, Gout, Osteoarthritis.   Bo Merino, MD  Note - This record has been created using Editor, commissioning.  Chart creation errors have been sought, but may not always  have been located. Such creation errors do not reflect on  the standard of medical care.

## 2020-09-21 NOTE — Progress Notes (Signed)
Chronic Care Management Pharmacy Assistant   Name: Dennis Moon  MRN: [id number] DOB: [birth year]  Reason for Encounter: Medication Review/Medication Coordination call.   Recent office visits:  09/07/2020 Dr.Kremer MD (PCP) started Ubrogepant 50 mg PRN   Recent consult visits:  No recent [facility] visits:  None in previous 6 months  Medications: Outpatient Encounter Medications as of 09/21/2020  Medication Sig Note  . allopurinol (ZYLOPRIM) 300 MG tablet Take 1 tablet (300 mg total) by mouth daily.   . ALPHAGAN P 0.1 % SOLN Apply 1 drop to eye 2 (two) times daily.   . Ascorbic Acid (VITAMIN C) 1000 MG tablet Take 1,000 mg by mouth daily.   [name] aspirin-acetaminophen-caffeine (EXCEDRIN EXTRA STRENGTH) 250-250-65 MG tablet Take by mouth every 6 (six) hours as needed for headache.   [name] atorvastatin (LIPITOR) 80 MG tablet Take 80 mg by mouth daily. 06/01/2020: Prescribed by Dr. Martinique  . Calcium Carb-Cholecalciferol (CALCIUM 600 + D PO) Take 1 tablet by mouth daily.   . cholecalciferol (VITAMIN D) 25 MCG (1000 UNIT) tablet Take 1,000 Units by mouth daily.   . Coenzyme Q10 300 MG CAPS Take 300 mg by mouth daily.   [name] CRANBERRY EXTRACT PO Take 4,200 mg by mouth in the morning and at bedtime.    . DULoxetine (CYMBALTA) 60 MG capsule TAKE ONE CAPSULE BY MOUTH EVERY EVENING   . EPINEPHrine 0.3 mg/0.3 mL IJ SOAJ injection Inject 0.3 mg into the muscle as needed for anaphylaxis.   [name] esomeprazole (NEXIUM) 20 MG capsule Take 1 capsule (20 mg total) by mouth 2 (two) times daily before a meal.   . fluorometholone (FML) 0.1 % ophthalmic suspension Place 1 drop into both eyes 2 (two) times daily.   [name] levocetirizine (XYZAL) 5 MG tablet Take 5 mg by mouth daily as needed for allergies.   [name] levothyroxine (SYNTHROID) 112 MCG tablet Take 1 tablet (112 mcg total) by mouth daily.   . nitroGLYCERIN (NITROSTAT) 0.4 MG SL tablet Place 1 tablet (0.4 mg total) under the tongue every 5 (five)  minutes as needed for chest pain. (Patient not taking: Reported on 09/07/2020)   . Nutritional Supplements (GRAPESEED EXTRACT PO) Take 100 mg by mouth daily.   . Probiotic Product (PROBIOTIC PO) Take 1 capsule by mouth daily.   . propranolol (INDERAL) 60 MG tablet TAKE 1 TABLET(60 MG) BY MOUTH TWICE DAILY   . testosterone (ANDROGEL) 50 MG/5GM (1%) GEL APPLY 5 GRAMS(1 PACKET) ONTO THE SKIN TWICE DAILY   . Ubrogepant (UBRELVY) 50 MG TABS Take one at first sign of migraine. May repeat once in 2 hours.   [name] zinc gluconate 50 MG tablet Take 50 mg by mouth daily.    No facility-administered encounter medications on file as of 09/21/2020.    Star Rating Drugs: Atorvastatin 80 mg last filled on 08/24/2020 for 30 day supply at [facility].  Reviewed chart for medication changes ahead of medication coordination call.   BP Readings from Last 3 Encounters:  09/07/20 (!) 142/80  07/27/20 124/76  04/06/20 136/86    No results found for: HGBA1C   Patient obtains medications through Adherence Packaging  30 Days   Last adherence delivery included:  Allopurinol 300 mgOnetabletdaily - Evening meals Alphagan 0.1% soln-Apply 1 drop to eye 2 (two) times daily Duloxetine 60 mgOneCapsuledaily - Evening Meals Levothyroxine 148mcgOneTablet Daily- Evening Meals Propranolol 60 mgTablet Take 1 tablet by mouth twice Daily- Breakfast &Evening Meals Atorvastatin 80 mgOneTabletdaily - Evening  Meals  Patient declined medications last month due:  Vitamin C 1000 mg daily-OTC Excedrin Extra Strength Every 6 hours PRN -Vial (OTC) Calcium carbonate 600 mg-OTC Vitamin D 1000 units- OTC CoQ10 300 mg- OTC Cranberry Extract 4200 mg- OTC Esomeprazole 20 mg BID- OTC Xyzal 5 mg daily PRN - Vial-OTC Magnesium Oxide 400 mg-  OTC Centrum Silver Mens- OTC Nitroglycerin 0.4 mg SL- PRN (adequate supply) Grapeseed Extract 100 mg - OTC Probiotic Daily- OTC Testosterone 50mg /5g (1%) gel 1 packet BID- (adequate supply) Turmeric (unknown dose)- OTC Zinc Gluconate 50 mg- OTC  Patient is due for next adherence delivery on: 09/27/2020. Called patient and reviewed medications and coordinated delivery.  This delivery to include:  Allopurinol 300 mgOnetabletdaily - Evening meals Alphagan 0.1% soln-Apply 1 drop to eye 2 (two) times daily Duloxetine 60 mgOneCapsuledaily - Evening Meals Levothyroxine 127mcgOneTablet Daily- Evening Meals Propranolol 60 mgTablet Take 1 tablet by mouth twice Daily- Breakfast &Evening Meals Atorvastatin 80 mgOneTabletdaily - Evening Meals  Testosterone 50mg /5g (1%) gel 1 packet BID  Patient declined the following medications   Vitamin C 1000 mg daily-OTC Excedrin Extra Strength Every 6 hours PRN -Vial (OTC) Calcium carbonate 600 mg-OTC Vitamin D 1000 units- OTC CoQ10 300 mg- OTC Cranberry Extract 4200 mg- OTC Esomeprazole 20 mg BID- OTC Xyzal 5 mg daily PRN - Vial-OTC Magnesium Oxide 400 mg- OTC Centrum Silver Mens- OTC Nitroglycerin 0.4 mg SL- PRN (adequate supply) Grapeseed Extract 100 mg - OTC Probiotic Daily- OTC  Turmeric (unknown dose)- OTC Zinc Gluconate 50 mg- OTC  Ubrogepant 50 mg PRN (Patient states he receive ubrogepant at another pharmacy.Patient states if it helps he will decide if he would like to continue and [facility]).  Patient needs refills for Allopurinol 300 mg.  Confirmed delivery date of  05/12//2022, advised patient that pharmacy will contact them the morning of delivery.  [place] Pharmacist Assistant [phone]

## 2020-09-22 NOTE — Progress Notes (Signed)
Cardiology Office Note    Date:  09/28/2020   ID:  Dennis Moon, DOB [birth year], MRN [id number]  PCP:  [name], MD  Cardiologist: Dr. Martinique  Chief Complaint  Patient presents with  . Coronary Artery Disease    History of Present Illness:    Dennis Moon is a 75 y.o. male with past medical history of HTN, HLD, and Stage 3 CKD who is seen for follow up CAD. He is also followed by Rheumatology for gout, subacute cutaneous lupus erythematosus, Sjogren's, osteoarthritis, and DDD.   He was  admitted to [facility] from 7/26 - 12/13/2016 for evaluation of chest pain.   EKG was without acute changes but his troponin values peaked at 0.40. A cardiac catheterization was performed which showed 2-vessel obstructive CAD with 90% Prox-LAD stenosis and 80% Prox Cx stenosis. PCI was performed with placement of DES to both the LAD and LCx. He was started on DAPT with ASA and Brilinta along with BB and statin therapy.   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   He was seen in March 2021 after an episode of chest pain while eating cereal. A Myoview study was ordered and was normal. In retrospect he feels this was his reflux.   On follow up today he is doing very well. 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 [name] He is exercising regularly. No chest pain, dyspnea, palpitations. Reports BP OK at home 130/80 typically.    Past Medical History:  Diagnosis Date  . CAD S/P percutaneous coronary angioplasty 12/12/2016   a. 11/2016: NSTEMI with DES to LAD and DES to LCx  . Gout   . Hypertension   . Hypothyroidism   . Migraine   . Sjogren's disease ([place])   . Systemic lupus erythematosus ([place])     Past Surgical History:  Procedure Laterality Date  . CATARACT EXTRACTION, BILATERAL Bilateral 2017  . COLONOSCOPY  2021  . CORONARY STENT INTERVENTION N/A 12/12/2016   Procedure: Coronary Stent Intervention;  Surgeon: Martinique, Zamia Tyminski M, MD;   Location: [facility];  Service: Cardiovascular;  Laterality: N/A;  . HERNIA REPAIR    . LEFT HEART CATH AND CORONARY ANGIOGRAPHY N/A 12/12/2016   Procedure: Left Heart Cath and Coronary Angiography;  Surgeon: Martinique, Jahayra Mazo M, MD;  Location: [facility];  Service: Cardiovascular;  Laterality: N/A;  . THYROID SURGERY  1997    Current Medications: Outpatient Medications Prior to Visit  Medication Sig Dispense Refill  . allopurinol (ZYLOPRIM) 300 MG tablet TAKE ONE TABLET BY MOUTH EVERY EVENING 90 tablet 0  . ALPHAGAN P 0.1 % SOLN Apply 1 drop to eye 2 (two) times daily.    . Ascorbic Acid (VITAMIN C) 1000 MG tablet Take 1,000 mg by mouth daily.    [name] aspirin-acetaminophen-caffeine (EXCEDRIN EXTRA STRENGTH) 250-250-65 MG tablet Take by mouth every 6 (six) hours as needed for headache.    [name] atorvastatin (LIPITOR) 80 MG tablet Take 80 mg by mouth daily.    . Calcium Carb-Cholecalciferol (CALCIUM 600 + D PO) Take 1 tablet by mouth daily.    . cholecalciferol (VITAMIN D) 25 MCG (1000 UNIT) tablet Take 1,000 Units by mouth daily.    . Coenzyme Q10 300 MG CAPS Take 300 mg by mouth daily.    [name] CRANBERRY EXTRACT PO Take 4,200 mg by mouth in the morning and at bedtime.     . DULoxetine (CYMBALTA)  60 MG capsule TAKE ONE CAPSULE BY MOUTH EVERY EVENING 90 capsule 0  . EPINEPHrine 0.3 mg/0.3 mL IJ SOAJ injection Inject 0.3 mg into the muscle as needed for anaphylaxis. 1 each 2  . esomeprazole (NEXIUM) 20 MG capsule Take 1 capsule (20 mg total) by mouth 2 (two) times daily before a meal. 90 capsule 3  . fluorometholone (FML) 0.1 % ophthalmic suspension Place 1 drop into both eyes 2 (two) times daily.    [name] levocetirizine (XYZAL) 5 MG tablet Take 5 mg by mouth daily as needed for allergies.    [name] levothyroxine (SYNTHROID) 112 MCG tablet Take 1 tablet (112 mcg total) by mouth daily. 90 tablet 2  . nitroGLYCERIN (NITROSTAT) 0.4 MG SL tablet Place 1 tablet (0.4 mg total) under the tongue every 5 (five)  minutes as needed for chest pain. 25 tablet 2  . Nutritional Supplements (GRAPESEED EXTRACT PO) Take 100 mg by mouth daily.    . Probiotic Product (PROBIOTIC PO) Take 1 capsule by mouth daily.    . propranolol (INDERAL) 60 MG tablet TAKE 1 TABLET(60 MG) BY MOUTH TWICE DAILY 180 tablet 4  . testosterone (ANDROGEL) 50 MG/5GM (1%) GEL apply FIVE grams (ONE PACKET) onto THE SKIN TWICE DAILY 300 g 4  . Ubrogepant (UBRELVY) 50 MG TABS Take one at first sign of migraine. May repeat once in 2 hours. 10 tablet 2  . zinc gluconate 50 MG tablet Take 50 mg by mouth daily.     No facility-administered medications prior to visit.     Allergies:   Bee venom   Social History   Socioeconomic History  . Marital status: Married    Spouse name: Not on file  . Number of children: Not on file  . Years of education: Not on file  . Highest education level: Not on file  Occupational History  . Occupation: Pharmacist, community  Tobacco Use  . Smoking status: Former Smoker    Packs/day: 1.00    Years: 6.00    Pack years: 6.00    Quit date: 04/25/1966    Years since quitting: 54.4  . Smokeless tobacco: Never Used  Vaping Use  . Vaping Use: Never used  Substance and Sexual Activity  . Alcohol use: Yes    Alcohol/week: 7.0 standard drinks    Types: 7 Glasses of wine per week  . Drug use: Never  . Sexual activity: Not on file  Other Topics Concern  . Not on file  Social History Narrative  . Not on file   Social Determinants of Health   Financial Resource Strain: Low Risk   . Difficulty of Paying Living Expenses: Not hard at all  Food Insecurity: No Food Insecurity  . Worried About Charity fundraiser in the Last Year: Never true  . Ran Out of Food in the Last Year: Never true  Transportation Needs: No Transportation Needs  . Lack of Transportation (Medical): No  . Lack of Transportation (Non-Medical): No  Physical Activity: Inactive  . Days of Exercise per Week: 0 days  . Minutes of Exercise per Session:  0 min  Stress: No Stress Concern Present  . Feeling of Stress : Not at all  Social Connections: Moderately Integrated  . Frequency of Communication with Friends and Family: More than three times a week  . Frequency of Social Gatherings with Friends and Family: More than three times a week  . Attends Religious Services: More than 4 times per year  . Active Member of  Clubs or Organizations: No  . Attends Archivist Meetings: Never  . Marital Status: Married     Family History:  The patient's family history includes Diabetes in his mother; Hypertension in his father and mother.   Review of Systems:   Please see the history of present illness.     All other systems reviewed and are otherwise negative except as noted above.   Physical Exam:    VS:  BP (!) 142/90 (BP Location: Left Arm, Patient Position: Sitting, Cuff Size: Normal)   Pulse 75   Ht 6' (1.829 m)   Wt 238 lb 12.8 oz (108.3 kg)   BMI 32.39 kg/m    GENERAL:  Well appearing, overweight WM in NAD HEENT:  PERRL, EOMI, sclera are clear. Oropharynx is clear. NECK:  No jugular venous distention, carotid upstroke brisk and symmetric, no bruits, no thyromegaly or adenopathy LUNGS:  Clear to auscultation bilaterally CHEST:  Unremarkable HEART:  RRR,  PMI not displaced or sustained,S1 and S2 within normal limits, no S3, no S4: no clicks, no rubs, no murmurs ABD:  Soft, nontender. BS +, no masses or bruits. No hepatomegaly, no splenomegaly EXT:  2 + pulses throughout, no edema, no cyanosis no clubbing SKIN:  Warm and dry.  No rashes NEURO:  Alert and oriented x 3. Cranial nerves II through XII intact. PSYCH:  Cognitively intact   Wt Readings from Last 3 Encounters:  09/28/20 238 lb 12.8 oz (108.3 kg)  09/07/20 242 lb 3.2 oz (109.9 kg)  07/27/20 239 lb 6.4 oz (108.6 kg)      Studies/Labs Reviewed:   EKG:  EKG is  ordered today. NSR rate 75. Normal. I have personally reviewed and interpreted this  study.    Recent Labs: 09/07/2020: ALT 29; BUN 24; Creatinine, Ser 1.26; Hemoglobin 14.7; Platelets 172.0; Potassium 4.0; Sodium 141; TSH 2.56   Lipid Panel    Component Value Date/Time   CHOL 124 09/07/2020 1053   CHOL 119 06/17/2019 0930   TRIG 123.0 09/07/2020 1053   HDL 42.20 09/07/2020 1053   HDL 41 06/17/2019 0930   CHOLHDL 3 09/07/2020 1053   VLDL 24.6 09/07/2020 1053   LDLCALC 57 09/07/2020 1053   LDLCALC 58 06/17/2019 0930    Additional studies/ records that were reviewed today include:   Echocardiogram: 12/12/2016 Study Conclusions  - Left ventricle: The cavity size was normal. Wall thickness was   increased in a pattern of mild LVH. Systolic function was normal.   The estimated ejection fraction was in the range of 60% to 65%.   Wall motion was normal; there were no regional wall motion   abnormalities. Doppler parameters are consistent with abnormal   left ventricular relaxation (grade 1 diastolic dysfunction). The   E/e&' ratio is <8, suggesting normal LV filling pressure. - Mitral valve: Mildly thickened leaflets . There was trivial   regurgitation. - Left atrium: The atrium was normal in size. - Tricuspid valve: There was trivial regurgitation. - Pulmonic valve: There was mild regurgitation. - Pulmonary arteries: PA peak pressure: 20 mm Hg (S). - Inferior vena cava: The vessel was normal in size. The   respirophasic diameter changes were in the normal range (>= 50%),   consistent with normal central venous pressure.  Impressions:  - LVEF 60-65%, mild LVH, normal wall motion, grade 1 DD, normal LV   filling pressure, normal LA size, trivial MR, TR, RVSP 20 mmHg,   normal IVC.  Cardiac Catheterization: 12/12/2016  Mid LAD  lesion, 40 %stenosed.  The left ventricular systolic function is normal.  LV end diastolic pressure is normal.  The left ventricular ejection fraction is 55-65% by visual estimate.  Prox LAD lesion, 90 %stenosed.  A STENT  PROMUS PREM MR 4.0X20 drug eluting stent was successfully placed.  Post intervention, there is a 0% residual stenosis.  Ost Cx to Prox Cx lesion, 80 %stenosed.  A STENT XIENCE ALPINE RX 3.0X18 drug eluting stent was successfully placed.  Post intervention, there is a 0% residual stenosis.   1. 2 vessel obstructive CAD 2. Normal LV function 3. Normal LVEDP 4. Successful stenting of the proximal LAD with DES 5. Successful stenting of the proximal LCx with DES.  Plan: DAPT for one year. Anticipate DC in am.   Myoview 07/29/19: Study Highlights   The left ventricular ejection fraction is mildly decreased (45-54%).  Nuclear stress EF: 54%.  There was no ST segment deviation noted during stress.  The study is normal.  This is a low risk study.      Assessment:    1. Coronary artery disease involving native coronary artery of native heart without angina pectoris   2. Hyperlipidemia LDL goal <70   3. Essential hypertension   4. Stage 3 chronic kidney disease, unspecified whether stage 3a or 3b CKD ([place])      Plan:   In order of problems listed above:  1. CAD s/p NSTEMI - s/p NSTEMI in July 2018. S/p  DES to both the LAD and LCx. Echo showed a preserved EF of 60-65% with no regional WMA.  -  Myoview March 2021 was normal.  - on ASA monotherapy.  - asymptomatic - follow up in 6 months.  2. HTN - well controlled. Continue Rx.   3. HLD - well controlled on high dose statin. LDL at goal. 58.  4. Stage 3 CKD - creatinine stable. Followed by Dr. Joelyn Oms.   Signed, Darris Carachure Martinique, MD  09/28/2020 1:21 PM    [facility] [address] [place], [place] [address] Phone: [phone]

## 2020-09-24 ENCOUNTER — Other Ambulatory Visit: Payer: Self-pay | Admitting: Family Medicine

## 2020-09-24 ENCOUNTER — Other Ambulatory Visit: Payer: Self-pay | Admitting: Rheumatology

## 2020-09-24 DIAGNOSIS — E291 Testicular hypofunction: Secondary | ICD-10-CM

## 2020-09-24 NOTE — Telephone Encounter (Addendum)
Next Visit: 10/05/2020  Last Visit: 04/06/2020  Last Fill: 06/19/2020  DX: Sjogren's syndrome with other organ involvement   Current Dose per office note 04/06/2020, allopurinol 300mg  po qd  Labs: 09/07/2020, CO2 35, BUN 24, GFR 55.51,  Uric acid is in desirable range.  Okay to refill allopurinol?

## 2020-09-26 MED ORDER — TESTOSTERONE 50 MG/5GM (1%) TD GEL: TRANSDERMAL | 4 refills | Status: DC

## 2020-09-26 NOTE — Addendum Note (Signed)
Addended by: Daron Offer A on: 09/26/2020 01:50 PM   Modules accepted: Orders

## 2020-09-28 ENCOUNTER — Ambulatory Visit: Payer: Medicare Other | Admitting: Cardiology

## 2020-09-28 ENCOUNTER — Other Ambulatory Visit: Payer: Self-pay

## 2020-09-28 ENCOUNTER — Encounter: Payer: Self-pay | Admitting: Cardiology

## 2020-09-28 VITALS — BP 142/90 | HR 75 | Ht 72.0 in | Wt 238.8 lb

## 2020-09-28 DIAGNOSIS — E785 Hyperlipidemia, unspecified: Secondary | ICD-10-CM | POA: Diagnosis not present

## 2020-09-28 DIAGNOSIS — I2510 Atherosclerotic heart disease of native coronary artery without angina pectoris: Secondary | ICD-10-CM | POA: Diagnosis not present

## 2020-09-28 DIAGNOSIS — I10 Essential (primary) hypertension: Secondary | ICD-10-CM | POA: Diagnosis not present

## 2020-09-28 DIAGNOSIS — N183 Chronic kidney disease, stage 3 unspecified: Secondary | ICD-10-CM | POA: Diagnosis not present

## 2020-10-05 ENCOUNTER — Other Ambulatory Visit: Payer: Self-pay

## 2020-10-05 ENCOUNTER — Ambulatory Visit: Payer: Medicare Other | Admitting: Rheumatology

## 2020-10-05 ENCOUNTER — Encounter: Payer: Self-pay | Admitting: Rheumatology

## 2020-10-05 VITALS — BP 125/81 | HR 87 | Resp 15 | Ht 72.0 in | Wt 242.0 lb

## 2020-10-05 DIAGNOSIS — M19042 Primary osteoarthritis, left hand: Secondary | ICD-10-CM

## 2020-10-05 DIAGNOSIS — M51369 Other intervertebral disc degeneration, lumbar region without mention of lumbar back pain or lower extremity pain: Secondary | ICD-10-CM

## 2020-10-05 DIAGNOSIS — I2510 Atherosclerotic heart disease of native coronary artery without angina pectoris: Secondary | ICD-10-CM | POA: Diagnosis not present

## 2020-10-05 DIAGNOSIS — Z87448 Personal history of other diseases of urinary system: Secondary | ICD-10-CM | POA: Diagnosis not present

## 2020-10-05 DIAGNOSIS — M19041 Primary osteoarthritis, right hand: Secondary | ICD-10-CM

## 2020-10-05 DIAGNOSIS — M5136 Other intervertebral disc degeneration, lumbar region: Secondary | ICD-10-CM

## 2020-10-05 DIAGNOSIS — M19012 Primary osteoarthritis, left shoulder: Secondary | ICD-10-CM

## 2020-10-05 DIAGNOSIS — Z79899 Other long term (current) drug therapy: Secondary | ICD-10-CM | POA: Diagnosis not present

## 2020-10-05 DIAGNOSIS — M1A09X0 Idiopathic chronic gout, multiple sites, without tophus (tophi): Secondary | ICD-10-CM | POA: Diagnosis not present

## 2020-10-05 DIAGNOSIS — M3509 Sicca syndrome with other organ involvement: Secondary | ICD-10-CM | POA: Diagnosis not present

## 2020-10-05 DIAGNOSIS — L931 Subacute cutaneous lupus erythematosus: Secondary | ICD-10-CM | POA: Diagnosis not present

## 2020-10-05 DIAGNOSIS — Z9861 Coronary angioplasty status: Secondary | ICD-10-CM

## 2020-10-05 NOTE — Patient Instructions (Signed)
Labs 1 week prior to your next visit.

## 2020-10-09 ENCOUNTER — Telehealth: Payer: Self-pay | Admitting: Family Medicine

## 2020-10-09 NOTE — Telephone Encounter (Signed)
Patient is calling in regards to his Androgel (Testosterone). He said that the pharmacy is unable to receive it via "escribe". Please call him at [phone] (w) or [phone] (c) and advise.

## 2020-10-10 NOTE — Telephone Encounter (Signed)
Verbally called Rx to pharmacy per pharmacist escribs down. Patient aware Rx sent in and will pick up today.

## 2020-10-18 ENCOUNTER — Telehealth: Payer: Self-pay

## 2020-10-18 NOTE — Progress Notes (Signed)
Chronic Care Management Pharmacy Assistant   Name: Dennis Moon  MRN: [id number] DOB: 1946-04-21  Reason for Encounter: Medication Review/Medication Coordination Call   Recent office visits:  No New Office Visits  Recent consult visits:  10/05/2020 Bo Merino, MD (Rheumatology) - Stop Brimonidine Tartrate 0.1% 09/28/2020  [name], MD (Cardiology) - NO medication changes noted  Hospital visits:  None in previous 6 months  Medications: Outpatient Encounter Medications as of 10/18/2020  Medication Sig Note  . allopurinol (ZYLOPRIM) 300 MG tablet TAKE ONE TABLET BY MOUTH EVERY EVENING   . Ascorbic Acid (VITAMIN C) 1000 MG tablet Take 1,000 mg by mouth daily.   [name] aspirin-acetaminophen-caffeine (EXCEDRIN EXTRA STRENGTH) 250-250-65 MG tablet Take by mouth every 6 (six) hours as needed for headache.   [name] atorvastatin (LIPITOR) 80 MG tablet Take 80 mg by mouth daily. 06/01/2020: Prescribed by Dr. Martinique  . Calcium Carb-Cholecalciferol (CALCIUM 600 + D PO) Take 1 tablet by mouth daily.   . cholecalciferol (VITAMIN D) 25 MCG (1000 UNIT) tablet Take 1,000 Units by mouth daily.   . Coenzyme Q10 300 MG CAPS Take 300 mg by mouth daily.   [name] CRANBERRY EXTRACT PO Take 4,200 mg by mouth in the morning and at bedtime.    . DULoxetine (CYMBALTA) 60 MG capsule TAKE ONE CAPSULE BY MOUTH EVERY EVENING   . EPINEPHrine 0.3 mg/0.3 mL IJ SOAJ injection Inject 0.3 mg into the muscle as needed for anaphylaxis.   [name] esomeprazole (NEXIUM) 20 MG capsule Take 1 capsule (20 mg total) by mouth 2 (two) times daily before a meal.   . fluorometholone (FML) 0.1 % ophthalmic suspension Place 1 drop into both eyes as needed.   [name] levocetirizine (XYZAL) 5 MG tablet Take 5 mg by mouth daily as needed for allergies.   [name] levothyroxine (SYNTHROID) 112 MCG tablet Take 1 tablet (112 mcg total) by mouth daily.   . nitroGLYCERIN (NITROSTAT) 0.4 MG SL tablet Place 1 tablet (0.4 mg total) under the tongue every 5 (five)  minutes as needed for chest pain.   . Nutritional Supplements (GRAPESEED EXTRACT PO) Take 100 mg by mouth daily.   . Probiotic Product (PROBIOTIC PO) Take 1 capsule by mouth daily.   . propranolol (INDERAL) 60 MG tablet TAKE 1 TABLET(60 MG) BY MOUTH TWICE DAILY   . testosterone (ANDROGEL) 50 MG/5GM (1%) GEL apply FIVE grams (ONE PACKET) onto THE SKIN TWICE DAILY   . Ubrogepant (UBRELVY) 50 MG TABS Take one at first sign of migraine. May repeat once in 2 hours.   [name] zinc gluconate 50 MG tablet Take 50 mg by mouth daily.    No facility-administered encounter medications on file as of 10/18/2020.   Star Rating Drugs: Atorvastatin 80 mg last refilled 09/24/2020 for a 30-Day supply at [facility] chart for medication changes ahead of medication coordination call.   BP Readings from Last 3 Encounters:  10/05/20 125/81  09/28/20 (!) 142/90  09/07/20 (!) 142/80    No results found for: HGBA1C   Patient obtains medications through Adherence Packaging  30 Days   Last adherence delivery included:    Allopurinol 300 mgOnetabletdaily - Evening meals  Alphagan 0.1% soln-Apply 1 drop to eye 2 (two) times daily  Duloxetine 60 mgOneCapsuledaily - Evening Meals  Levothyroxine 189mcgOneTablet Daily- Evening Meals  Propranolol 60 mgTablet Take 1 tablet by mouth twice Daily- Breakfast   &Evening Meals  Atorvastatin 80 mgOneTabletdaily - Evening Meals    Testosterone 50mg /5g (1%)  gel 1 packet BID  Patient declined medication last month   Vitamin C 1000 mg daily-OTC  Excedrin Extra Strength Every 6 hours PRN -Vial (OTC)  Calcium carbonate 600 mg-OTC  Vitamin D 1000 units- OTC  CoQ10 300 mg- OTC  Cranberry Extract 4200 mg- OTC  Esomeprazole 20 mg BID- OTC  Xyzal 5 mg daily PRN - Vial-OTC  Magnesium Oxide 400 mg- OTC  Centrum Silver Mens- OTC  Nitroglycerin 0.4 mg SL- PRN (adequate supply)  Grapeseed Extract 100 mg  - OTC  Probiotic Daily- OTC  Testosterone 50mg /5g (1%) gel 1 packet BID- (adequate supply)  Turmeric (unknown dose)- OTC  Zinc Gluconate 50 mg- OTC  Patient is due for next adherence delivery on: 10/25/2020. Called patient and reviewed medications and coordinated delivery.   Patient wife states to call back on 06/03 at 10:00 am , Patient wifes states patient will return call.  I have attempted without success to contact this patient by phone three times to do his medication coordination  Call.I will reach out again in one month.  [place] Pharmacist Assistant [phone]

## 2020-10-22 ENCOUNTER — Telehealth: Payer: Self-pay | Admitting: Family Medicine

## 2020-10-22 ENCOUNTER — Other Ambulatory Visit: Payer: Self-pay | Admitting: Family Medicine

## 2020-10-22 DIAGNOSIS — R519 Headache, unspecified: Secondary | ICD-10-CM

## 2020-10-22 NOTE — Chronic Care Management (AMB) (Signed)
10/22/2020- Patient requesting Epi-pen prescription, calling PCP office for new request per Junius Argyle, CPP.  Spoke with Marita Kansas from [facility] , she is placing a request to Dr Ethelene Hal for Epi-pen prescription. Junius Argyle, CPP notified.  Pattricia Boss, [place] Clinical Pharmacist Assistant [phone] CCM Time: 12 mins.

## 2020-10-22 NOTE — Telephone Encounter (Signed)
Dennis Moon w/[facility] # [phone]  Requesting RX for EpiPen for pt to be sent to [facility]. Pt has not had in a long time.

## 2020-10-24 NOTE — Telephone Encounter (Signed)
Called and spoke with pharmacist Rx sent in on 09/07/20 with 2 refills.

## 2020-10-26 ENCOUNTER — Telehealth: Payer: Self-pay

## 2020-10-26 DIAGNOSIS — M3210 Systemic lupus erythematosus, organ or system involvement unspecified: Secondary | ICD-10-CM | POA: Diagnosis not present

## 2020-10-26 DIAGNOSIS — H35383 Toxic maculopathy, bilateral: Secondary | ICD-10-CM | POA: Diagnosis not present

## 2020-10-26 DIAGNOSIS — H43813 Vitreous degeneration, bilateral: Secondary | ICD-10-CM | POA: Diagnosis not present

## 2020-10-26 NOTE — Progress Notes (Signed)
Reach out to optometrist to request a refill of fluorometholone 0.1% [facility].  Per Optometrist, there is only one provider working today and may not be sent until Monday.  Reach out to [facility] to request a transfer of fluorometholone [facility] 10/26/2020.Per [facility] they do not have any refills.  Notified Clinical Pharmacist.  Bessie [place] Pharmacist Assistant [phone]

## 2020-11-06 ENCOUNTER — Ambulatory Visit (INDEPENDENT_AMBULATORY_CARE_PROVIDER_SITE_OTHER): Payer: Medicare Other | Admitting: *Deleted

## 2020-11-06 DIAGNOSIS — Z0000 Encounter for general adult medical examination without abnormal findings: Secondary | ICD-10-CM | POA: Diagnosis not present

## 2020-11-06 NOTE — Patient Instructions (Signed)
Dennis Moon , Thank you for taking time to come for your Medicare Wellness Visit. I appreciate your ongoing commitment to your health goals. Please review the following plan we discussed and let me know if I can assist you in the future.   Screening recommendations/referrals: Colonoscopy: no longer required Recommended yearly ophthalmology/optometry visit for glaucoma screening and checkup Recommended yearly dental visit for hygiene and checkup  Vaccinations: Influenza vaccine: up to date Pneumococcal vaccine: up to date Tdap vaccine: up to date Shingles vaccine: up to date    Advanced directives: scanned in chart  Conditions/risks identified: na  Next appointment: 11-07-2021 @ 2:15 Annual Wellness telephone  Preventive Care 76 Years and Older, Male Preventive care refers to lifestyle choices and visits with your health care provider that can promote health and wellness. What does preventive care include? A yearly physical exam. This is also called an annual well check. Dental exams once or twice a year. Routine eye exams. Ask your health care provider how often you should have your eyes checked. Personal lifestyle choices, including: Daily care of your teeth and gums. Regular physical activity. Eating a healthy diet. Avoiding tobacco and drug use. Limiting alcohol use. Practicing safe sex. Taking low doses of aspirin every day. Taking vitamin and mineral supplements as recommended by your health care provider. What happens during an annual well check? The services and screenings done by your health care provider during your annual well check will depend on your age, overall health, lifestyle risk factors, and family history of disease. Counseling  Your health care provider may ask you questions about your: Alcohol use. Tobacco use. Drug use. Emotional well-being. Home and relationship well-being. Sexual activity. Eating habits. History of falls. Memory and ability to  understand (cognition). Work and work Statistician. Screening  You may have the following tests or measurements: Height, weight, and BMI. Blood pressure. Lipid and cholesterol levels. These may be checked every 5 years, or more frequently if you are over 29 years old. Skin check. Lung cancer screening. You may have this screening every year starting at age 46 if you have a 30-pack-year history of smoking and currently smoke or have quit within the past 15 years. Fecal occult blood test (FOBT) of the stool. You may have this test every year starting at age 59. Flexible sigmoidoscopy or colonoscopy. You may have a sigmoidoscopy every 5 years or a colonoscopy every 10 years starting at age 40. Prostate cancer screening. Recommendations will vary depending on your family history and other risks. Hepatitis C blood test. Hepatitis B blood test. Sexually transmitted disease (STD) testing. Diabetes screening. This is done by checking your blood sugar (glucose) after you have not eaten for a while (fasting). You may have this done every 1-3 years. Abdominal aortic aneurysm (AAA) screening. You may need this if you are a current or former smoker. Osteoporosis. You may be screened starting at age 32 if you are at high risk. Talk with your health care provider about your test results, treatment options, and if necessary, the need for more tests. Vaccines  Your health care provider may recommend certain vaccines, such as: Influenza vaccine. This is recommended every year. Tetanus, diphtheria, and acellular pertussis (Tdap, Td) vaccine. You may need a Td booster every 10 years. Zoster vaccine. You may need this after age 41. Pneumococcal 13-valent conjugate (PCV13) vaccine. One dose is recommended after age 31. Pneumococcal polysaccharide (PPSV23) vaccine. One dose is recommended after age 49. Talk to your health care provider about  which screenings and vaccines you need and how often you need them. This  information is not intended to replace advice given to you by your health care provider. Make sure you discuss any questions you have with your health care provider. Document Released: 06/01/2015 Document Revised: 01/23/2016 Document Reviewed: 03/06/2015 Elsevier Interactive Patient Education  2017 [employer] Prevention in the Home Falls can cause injuries. They can happen to people of all ages. There are many things you can do to make your home safe and to help prevent falls. What can I do on the outside of my home? Regularly fix the edges of walkways and driveways and fix any cracks. Remove anything that might make you trip as you walk through a door, such as a raised step or threshold. Trim any bushes or trees on the path to your home. Use bright outdoor lighting. Clear any walking paths of anything that might make someone trip, such as rocks or tools. Regularly check to see if handrails are loose or broken. Make sure that both sides of any steps have handrails. Any raised decks and porches should have guardrails on the edges. Have any leaves, snow, or ice cleared regularly. Use sand or salt on walking paths during winter. Clean up any spills in your garage right away. This includes oil or grease spills. What can I do in the bathroom? Use night lights. Install grab bars by the toilet and in the tub and shower. Do not use towel bars as grab bars. Use non-skid mats or decals in the tub or shower. If you need to sit down in the shower, use a plastic, non-slip stool. Keep the floor dry. Clean up any water that spills on the floor as soon as it happens. Remove soap buildup in the tub or shower regularly. Attach bath mats securely with double-sided non-slip rug tape. Do not have throw rugs and other things on the floor that can make you trip. What can I do in the bedroom? Use night lights. Make sure that you have a light by your bed that is easy to reach. Do not use any sheets or  blankets that are too big for your bed. They should not hang down onto the floor. Have a firm chair that has side arms. You can use this for support while you get dressed. Do not have throw rugs and other things on the floor that can make you trip. What can I do in the kitchen? Clean up any spills right away. Avoid walking on wet floors. Keep items that you use a lot in easy-to-reach places. If you need to reach something above you, use a strong step stool that has a grab bar. Keep electrical cords out of the way. Do not use floor polish or wax that makes floors slippery. If you must use wax, use non-skid floor wax. Do not have throw rugs and other things on the floor that can make you trip. What can I do with my stairs? Do not leave any items on the stairs. Make sure that there are handrails on both sides of the stairs and use them. Fix handrails that are broken or loose. Make sure that handrails are as long as the stairways. Check any carpeting to make sure that it is firmly attached to the stairs. Fix any carpet that is loose or worn. Avoid having throw rugs at the top or bottom of the stairs. If you do have throw rugs, attach them to the floor with  carpet tape. Make sure that you have a light switch at the top of the stairs and the bottom of the stairs. If you do not have them, ask someone to add them for you. What else can I do to help prevent falls? Wear shoes that: Do not have high heels. Have rubber bottoms. Are comfortable and fit you well. Are closed at the toe. Do not wear sandals. If you use a stepladder: Make sure that it is fully opened. Do not climb a closed stepladder. Make sure that both sides of the stepladder are locked into place. Ask someone to hold it for you, if possible. Clearly mark and make sure that you can see: Any grab bars or handrails. First and last steps. Where the edge of each step is. Use tools that help you move around (mobility aids) if they are  needed. These include: Canes. Walkers. Scooters. Crutches. Turn on the lights when you go into a dark area. Replace any light bulbs as soon as they burn out. Set up your furniture so you have a clear path. Avoid moving your furniture around. If any of your floors are uneven, fix them. If there are any pets around you, be aware of where they are. Review your medicines with your doctor. Some medicines can make you feel dizzy. This can increase your chance of falling. Ask your doctor what other things that you can do to help prevent falls. This information is not intended to replace advice given to you by your health care provider. Make sure you discuss any questions you have with your health care provider. Document Released: 03/01/2009 Document Revised: 10/11/2015 Document Reviewed: 06/09/2014 Elsevier Interactive Patient Education  2017 [employer].

## 2020-11-06 NOTE — Progress Notes (Signed)
Subjective:   Dennis Moon is a 75 y.o. male who presents for Medicare Annual/Subsequent preventive examination.  I connected with  Dennis Moon on 11/06/20 by a  telephone enabled telemedicine application and verified that I am speaking with the correct person using two identifiers.   I discussed the limitations of evaluation and management by telemedicine. The patient expressed understanding and agreed to proceed.  Patient location: home  Provider location: tele-health visit     Review of Systems    NA       Objective:    There were no vitals filed for this visit. There is no height or weight on file to calculate BMI.  Advanced Directives 11/02/2019 12/12/2016  Does Patient Have a Medical Advance Directive? Yes Yes  Type of Paramedic of [place];Living will Living will  Does patient want to make changes to medical advance directive? - No - Patient declined  Copy of [place] in Chart? No - copy requested -    Current Medications (verified) Outpatient Encounter Medications as of 11/06/2020  Medication Sig   allopurinol (ZYLOPRIM) 300 MG tablet TAKE ONE TABLET BY MOUTH EVERY EVENING   Ascorbic Acid (VITAMIN C) 1000 MG tablet Take 1,000 mg by mouth daily.   aspirin-acetaminophen-caffeine (EXCEDRIN EXTRA STRENGTH) 250-250-65 MG tablet Take by mouth every 6 (six) hours as needed for headache.   atorvastatin (LIPITOR) 80 MG tablet Take 80 mg by mouth daily.   Calcium Carb-Cholecalciferol (CALCIUM 600 + D PO) Take 1 tablet by mouth daily.   cholecalciferol (VITAMIN D) 25 MCG (1000 UNIT) tablet Take 1,000 Units by mouth daily.   Coenzyme Q10 300 MG CAPS Take 300 mg by mouth daily.   CRANBERRY EXTRACT PO Take 4,200 mg by mouth in the morning and at bedtime.    DULoxetine (CYMBALTA) 60 MG capsule TAKE ONE CAPSULE BY MOUTH EVERY EVENING   EPINEPHrine 0.3 mg/0.3 mL IJ SOAJ injection Inject 0.3 mg into the muscle as needed for  anaphylaxis.   esomeprazole (NEXIUM) 20 MG capsule Take 1 capsule (20 mg total) by mouth 2 (two) times daily before a meal.   fluorometholone (FML) 0.1 % ophthalmic suspension Place 1 drop into both eyes as needed.   levocetirizine (XYZAL) 5 MG tablet Take 5 mg by mouth daily as needed for allergies.   levothyroxine (SYNTHROID) 112 MCG tablet Take 1 tablet (112 mcg total) by mouth daily.   nitroGLYCERIN (NITROSTAT) 0.4 MG SL tablet Place 1 tablet (0.4 mg total) under the tongue every 5 (five) minutes as needed for chest pain.   Nutritional Supplements (GRAPESEED EXTRACT PO) Take 100 mg by mouth daily.   Probiotic Product (PROBIOTIC PO) Take 1 capsule by mouth daily.   propranolol (INDERAL) 60 MG tablet TAKE ONE TABLET BY MOUTH EVERY MORNING and TAKE ONE TABLET BY MOUTH EVERY EVENING   testosterone (ANDROGEL) 50 MG/5GM (1%) GEL apply FIVE grams (ONE PACKET) onto THE SKIN TWICE DAILY   Ubrogepant (UBRELVY) 50 MG TABS Take one at first sign of migraine. May repeat once in 2 hours.   zinc gluconate 50 MG tablet Take 50 mg by mouth daily.   No facility-administered encounter medications on file as of 11/06/2020.    Allergies (verified) Bee venom   History: Past Medical History:  Diagnosis Date   CAD S/P percutaneous coronary angioplasty 12/12/2016   a. 11/2016: NSTEMI with DES to LAD and DES to LCx   Gout    Hypertension  Hypothyroidism    Migraine    Sjogren's disease ([place])    Systemic lupus erythematosus ([place])    Past Surgical History:  Procedure Laterality Date   CATARACT EXTRACTION, BILATERAL Bilateral 2017   COLONOSCOPY  2021   CORONARY STENT INTERVENTION N/A 12/12/2016   Procedure: Coronary Stent Intervention;  Surgeon: Martinique, Peter M, MD;  Location: [facility];  Service: Cardiovascular;  Laterality: N/A;   HERNIA REPAIR     LEFT HEART CATH AND CORONARY ANGIOGRAPHY N/A 12/12/2016   Procedure: Left Heart Cath and Coronary Angiography;  Surgeon: Martinique, Peter M, MD;   Location: [facility];  Service: Cardiovascular;  Laterality: N/A;   THYROID SURGERY  1997   Family History  Problem Relation Age of Onset   Diabetes Mother    Hypertension Mother    Hypertension Father    Healthy Son    Social History   Socioeconomic History   Marital status: Married    Spouse name: Not on file   Number of children: Not on file   Years of education: Not on file   Highest education level: Not on file  Occupational History   Occupation: Dentist  Tobacco Use   Smoking status: Former    Packs/day: 1.00    Years: 6.00    Pack years: 6.00    Types: Cigarettes    Quit date: 04/25/1966    Years since quitting: 54.5   Smokeless tobacco: Never  Vaping Use   Vaping Use: Never used  Substance and Sexual Activity   Alcohol use: Yes    Alcohol/week: 7.0 standard drinks    Types: 7 Glasses of wine per week   Drug use: Never   Sexual activity: Not on file  Other Topics Concern   Not on file  Social History Narrative   Not on file   Social Determinants of Health   Financial Resource Strain: Low Risk    Difficulty of Paying Living Expenses: Not hard at all  Food Insecurity: No Food Insecurity   Worried About Charity fundraiser in the Last Year: Never true   [place] in the Last Year: Never true  Transportation Needs: No Transportation Needs   Lack of Transportation (Medical): No   Lack of Transportation (Non-Medical): No  Physical Activity: Not on file  Stress: No Stress Concern Present   Feeling of Stress : Not at all  Social Connections: Moderately Integrated   Frequency of Communication with Friends and Family: More than three times a week   Frequency of Social Gatherings with Friends and Family: Three times a week   Attends Religious Services: More than 4 times per year   Active Member of Clubs or Organizations: No   Attends Archivist Meetings: Never   Marital Status: Married    Tobacco Counseling Counseling given: Not  Answered   Clinical Intake:  Pre-visit preparation completed: Yes  Pain : No/denies pain     Nutritional Risks: None Diabetes: No  How often do you need to have someone help you when you read instructions, pamphlets, or other written materials from your doctor or pharmacy?: 1 - Never  Diabetic?NO  Interpreter Needed?: No  Information entered by :: Leroy Kennedy LPN   Activities of Daily Living No flowsheet data found.  Patient Care Team: [name], MD as PCP - General (Family Medicine) Martinique, Peter M, MD as PCP - Cardiology (Cardiology) Germaine Pomfret, [facility] as Pharmacist (Pharmacist) Bo Merino, MD as  Consulting Physician (Rheumatology)  Indicate any recent Medical Services you may have received from other than [facility] providers in the past year (date may be approximate).     Assessment:   This is a routine wellness examination for Dennis Moon.  Hearing/Vision screen No results found.  Dietary issues and exercise activities discussed:     Goals Addressed             This Visit's Progress    Patient Stated   On track    Cut out soft drinks, Healthier diet, increase excercise      Patient Stated       Loose 10 lb pounds        Depression Screen PHQ 2/9 Scores 09/07/2020 12/29/2019 11/02/2019 06/24/2019  PHQ - 2 Score 0 0 0 0  PHQ- 9 Score - 1 - -    Fall Risk Fall Risk  11/06/2020 09/07/2020 12/29/2019 11/02/2019 06/24/2019  Falls in the past year? 1 0 0 0 1  Number falls in past yr: 0 - - 0 0  Injury with Fall? 1 - - 0 0  Comment fell out of lawn chair.  sprain in neck no visit - - - only briuse  Follow up Falls evaluation completed;Falls prevention discussed - - - -    FALL RISK PREVENTION PERTAINING TO THE HOME:  Any stairs in or around the home? Yes  If so, are there any without handrails? Yes  Home free of loose throw rugs in walkways, pet beds, electrical cords, etc? Yes  Adequate lighting in your home to reduce risk of falls?  Yes   ASSISTIVE DEVICES UTILIZED TO PREVENT FALLS:  Life alert? No  Use of a cane, walker or w/c? No  Grab bars in the bathroom? Yes  Shower chair or bench in shower? Yes  Elevated toilet seat or a handicapped toilet? Yes   TIMED UP AND GO:  Was the test performed? No .     TELE-HEALTH VISIT    Cognitive Function: Normal cognitive status assessed by direct observation by this Nurse Health Advisor. No abnormalities found.          Immunizations Immunization History  Administered Date(s) Administered   Influenza-Unspecified 02/24/2018, 02/04/2019, 02/16/2020   PFIZER(Purple Top)SARS-COV-2 Vaccination 06/17/2019, 07/08/2019, 02/16/2020   Pneumococcal Conjugate-13 10/21/2013   Pneumococcal Polysaccharide-23 08/02/2008, 03/14/2016   Tdap 07/18/2019   Zoster Recombinat (Shingrix) 11/12/2017   Zoster, Live 08/28/2006    TDAP status: Up to date  Flu Vaccine status: Up to date  Pneumococcal vaccine status: Up to date  Covid-19 vaccine status: Information provided on how to obtain vaccines.   Qualifies for Shingles Vaccine? Yes   Zostavax completed Yes   Shingrix Completed?: Yes  Screening Tests Health Maintenance  Topic Date Due   Zoster Vaccines- Shingrix (2 of 2) 01/07/2018   COVID-19 Vaccine (4 - Booster for Pfizer series) 05/17/2020   INFLUENZA VACCINE  12/17/2020   TETANUS/TDAP  07/17/2029   Hepatitis C Screening  Completed   PNA vac Low Risk Adult  Completed   HPV VACCINES  Aged Out    Health Maintenance  Health Maintenance Due  Topic Date Due   Zoster Vaccines- Shingrix (2 of 2) 01/07/2018   COVID-19 Vaccine (4 - Booster for Pfizer series) 05/17/2020    Colorectal cancer screening: No longer required.   Lung Cancer Screening: (Low Dose CT Chest recommended if Age 12-80 years, 30 pack-year currently smoking OR have quit w/in 15years.) does not qualify.   Lung Cancer  Screening Referral: na  Additional Screening:  Hepatitis C Screening: does not  qualify; Completed 10-03-2016  Vision Screening: Recommended annual ophthalmology exams for early detection of glaucoma and other disorders of the eye. Is the patient up to date with their annual eye exam?  Yes  Who is the provider or what is the name of the office in which the patient attends annual eye exams? Dr. Craige Cotta. [place]/Dr. Tye Savoy If pt is not established with a provider, would they like to be referred to a provider to establish care?  established .   Dental Screening: Recommended annual dental exams for proper oral hygiene  Community Resource Referral / Chronic Care Management: CRR required this visit?  No   CCM required this visit?  No      Plan:     I have personally reviewed and noted the following in the patient's chart:   Medical and social history Use of alcohol, tobacco or illicit drugs  Current medications and supplements including opioid prescriptions. Patient is not currently taking opioid prescriptions. Functional ability and status Nutritional status Physical activity Advanced directives List of other physicians Hospitalizations, surgeries, and ER visits in previous 12 months Vitals Screenings to include cognitive, depression, and falls Referrals and appointments  In addition, I have reviewed and discussed with patient certain preventive protocols, quality metrics, and best practice recommendations. A written personalized care plan for preventive services as well as general preventive health recommendations were provided to patient.     Leroy Kennedy, LPN   [id number]   Nurse Notes: na

## 2020-11-14 ENCOUNTER — Other Ambulatory Visit: Payer: Self-pay | Admitting: Family Medicine

## 2020-11-14 ENCOUNTER — Other Ambulatory Visit: Payer: Self-pay | Admitting: Cardiology

## 2020-11-14 DIAGNOSIS — Z91030 Bee allergy status: Secondary | ICD-10-CM

## 2020-11-16 ENCOUNTER — Telehealth: Payer: Medicare Other

## 2020-11-16 NOTE — Progress Notes (Deleted)
Chronic Care Management Pharmacy Note  11/16/2020 Name:  Dennis Moon MRN:  [id number] DOB:  Feb 18, 1946  Summary: ***  Recommendations/Changes made from today's visit: ***  Plan: ***   Subjective: Dennis Moon is an 75 y.o. year old male who is a primary patient of [name], MD.  The CCM team was consulted for assistance with disease management and care coordination needs.    Engaged with patient by telephone for follow up visit in response to provider referral for pharmacy case management and/or care coordination services.   Consent to Services:  The patient was given information about Chronic Care Management services, agreed to services, and gave verbal consent prior to initiation of services.  Please see initial visit note for detailed documentation.   Patient Care Team: [name], MD as PCP - General (Family Medicine) Martinique, Peter M, MD as PCP - Cardiology (Cardiology) Germaine Pomfret, [facility] as Pharmacist (Pharmacist) Bo Merino, MD as Consulting Physician (Rheumatology)  Recent office visits: 09/07/20: Patient presented to Dr. Ethelene Hal for follow-up. EpiPen for bee stings, Ubrevly for migraine treatment.   Recent consult visits: 10/05/20: Patient presented to Dr. Estanislado Pandy (Rheumatology) for follow-up.  09/28/20: Patient presented to Dr. Martinique (Cardiology) for follow-up.   Hospital visits: {Hospital DC Yes/No:25215}   Objective:  Lab Results  Component Value Date   CREATININE 1.26 09/07/2020   BUN 24 (H) 09/07/2020   GFR 55.51 (L) 09/07/2020   GFRNONAA 51 (L) 07/01/2019   GFRAA 59 (L) 07/01/2019   NA 141 09/07/2020   K 4.0 09/07/2020   CALCIUM 8.8 09/07/2020   CO2 35 (H) 09/07/2020   GLUCOSE 91 09/07/2020    Lab Results  Component Value Date/Time   GFR 55.51 (L) 09/07/2020 10:53 AM   GFR 55.04 (L) 07/27/2020 10:52 AM    Last diabetic Eye exam: No results found for: HMDIABEYEEXA  Last diabetic Foot exam: No  results found for: HMDIABFOOTEX   Lab Results  Component Value Date   CHOL 124 09/07/2020   HDL 42.20 09/07/2020   LDLCALC 57 09/07/2020   TRIG 123.0 09/07/2020   CHOLHDL 3 09/07/2020    Hepatic Function Latest Ref Rng & Units 09/07/2020 12/29/2019 07/01/2019  Total Protein 6.0 - 8.3 g/dL 7.1 7.1 7.1  Albumin 3.5 - 5.2 g/dL 3.8 4.2 4.3  AST 0 - 37 U/L _0 ALT 0 - 53 U/L 29 26 36  Alk Phosphatase 39 - 117 U/L 88 76 87  Total Bilirubin 0.2 - 1.2 mg/dL 0.5 0.5 0.2  Bilirubin, Direct 0.00 - 0.40 mg/dL - - -    Lab Results  Component Value Date/Time   TSH 2.56 09/07/2020 10:53 AM   TSH 3.47 03/05/2020 08:05 AM   FREET4 0.89 12/12/2016 03:30 AM    CBC Latest Ref Rng & Units 09/07/2020 07/27/2020 12/29/2019  WBC 4.0 - 10.5 K/uL 5.6 6.6 5.8  Hemoglobin 13.0 - 17.0 g/dL 14.7 14.4 14.2  Hematocrit 39.0 - 52.0 % 44.9 43.9 42.8  Platelets 150.0 - 400.0 K/uL 172.0 260.0 202.0    No results found for: VD25OH  Clinical ASCVD: {YES/NO:21197} The ASCVD Risk score Mikey Bussing DC Jr., et al., 2013) failed to calculate for the following reasons:   The patient has a prior MI or stroke diagnosis    Depression screen [facility] 2/9 11/06/2020 09/07/2020 12/29/2019  Decreased Interest 0 0 0  Down, Depressed, Hopeless 0 0 0  PHQ - 2 Score 0 0 0  Altered sleeping - -  1  Tired, decreased energy - - 0  Change in appetite - - 0  Feeling bad or failure about yourself  - - 0  Trouble concentrating - - 0  Moving slowly or fidgety/restless - - 0  Suicidal thoughts - - 0  PHQ-9 Score - - 1  Difficult doing work/chores - - Not difficult at all     ***Other: (CHADS2VASc if Afib, MMRC or CAT for COPD, ACT, DEXA)  Social History   Tobacco Use  Smoking Status Former   Packs/day: 1.00   Years: 6.00   Pack years: 6.00   Types: Cigarettes   Quit date: 04/25/1966   Years since quitting: 54.6  Smokeless Tobacco Never   BP Readings from Last 3 Encounters:  10/05/20 125/81  09/28/20 (!) 142/90  09/07/20  (!) 142/80   Pulse Readings from Last 3 Encounters:  10/05/20 87  09/28/20 75  09/07/20 81   Wt Readings from Last 3 Encounters:  10/05/20 242 lb (109.8 kg)  09/28/20 238 lb 12.8 oz (108.3 kg)  09/07/20 242 lb 3.2 oz (109.9 kg)   BMI Readings from Last 3 Encounters:  10/05/20 32.82 kg/m  09/28/20 32.39 kg/m  09/07/20 32.85 kg/m    Assessment/Interventions: Review of patient past medical history, allergies, medications, health status, including review of consultants reports, laboratory and other test data, was performed as part of comprehensive evaluation and provision of chronic care management services.   SDOH:  (Social Determinants of Health) assessments and interventions performed: {yes/no:20286}  SDOH Screenings   Alcohol Screen: Low Risk    Last Alcohol Screening Score (AUDIT): 6  Depression (PHQ2-9): Low Risk    PHQ-2 Score: 0  Financial Resource Strain: Low Risk    Difficulty of Paying Living Expenses: Not hard at all  Food Insecurity: No Food Insecurity   Worried About Charity fundraiser in the Last Year: Never true   Ran Out of Food in the Last Year: Never true  Housing: Low Risk    Last Housing Risk Score: 0  Physical Activity: Sufficiently Active   Days of Exercise per Week: 4 days   Minutes of Exercise per Session: 40 min  Social Connections: Moderately Integrated   Frequency of Communication with Friends and Family: More than three times a week   Frequency of Social Gatherings with Friends and Family: Three times a week   Attends Religious Services: More than 4 times per year   Active Member of Clubs or Organizations: No   Attends Archivist Meetings: Never   Marital Status: Married  Stress: No Stress Concern Present   Feeling of Stress : Not at all  Tobacco Use: Medium Risk   Smoking Tobacco Use: Former   Smokeless Tobacco Use: Never  Transportation Needs: No Data processing manager (Medical): No   Lack of  Transportation (Non-Medical): No    CCM Care Plan  Allergies  Allergen Reactions   Bee Venom Swelling    Medications Reviewed Today     Reviewed by Suszanne Finch, LPN (Licensed Practical Nurse) on 11/06/20 at 1253  Med List Status: <None>   Medication Order Taking? Sig Documenting Provider Last Dose Status Informant  allopurinol (ZYLOPRIM) 300 MG tablet [id number] Yes TAKE ONE TABLET BY MOUTH EVERY EVENING Ofilia Neas, PA-C Taking Active   Ascorbic Acid (VITAMIN C) 1000 MG tablet [id number] Yes Take 1,000 mg by mouth daily. [provider] Taking Active   aspirin-acetaminophen-caffeine (EXCEDRIN EXTRA STRENGTH) [phone] MG tablet  [id number] Yes Take by mouth every 6 (six) hours as needed for headache. [provider] Taking Active   atorvastatin (LIPITOR) 80 MG tablet [id number] Yes Take 80 mg by mouth daily. [provider] Taking Active            Med Note Michaelle Birks, Cathe Mons A   Fri Jun 01, 2020  1:16 PM) Prescribed by Dr. Martinique  Calcium Carb-Cholecalciferol (CALCIUM 600 + D PO) [id number] Yes Take 1 tablet by mouth daily. [provider] Taking Active   cholecalciferol (VITAMIN D) 25 MCG (1000 UNIT) tablet [id number] Yes Take 1,000 Units by mouth daily. [provider] Taking Active   Coenzyme Q10 300 MG CAPS [id number] Yes Take 300 mg by mouth daily. [provider] Taking Active   CRANBERRY EXTRACT PO [id number] Yes Take 4,200 mg by mouth in the morning and at bedtime.  [provider] Taking Active   DULoxetine (CYMBALTA) 60 MG capsule [id number] Yes TAKE ONE CAPSULE BY MOUTH EVERY EVENING [name], MD Taking Active   EPINEPHrine 0.3 mg/0.3 mL IJ SOAJ injection [id number] Yes Inject 0.3 mg into the muscle as needed for anaphylaxis. [name], MD Taking Active   esomeprazole (NEXIUM) 20 MG capsule [id number] Yes Take 1 capsule (20 mg total) by mouth 2 (two) times daily before a meal. [name], MD Taking Active   fluorometholone (FML) 0.1 % ophthalmic suspension [id number] Yes Place 1 drop into both eyes as needed. [provider] Taking Active   levocetirizine (XYZAL) 5 MG tablet [id number] Yes Take 5 mg by mouth daily as needed for allergies. [provider] Taking Active   levothyroxine (SYNTHROID) 112 MCG tablet [id number] Yes Take 1 tablet (112 mcg total) by mouth daily. [name], MD Taking Active   nitroGLYCERIN (NITROSTAT) 0.4 MG SL tablet [id number] Yes Place 1 tablet (0.4 mg total) under the tongue every 5 (five) minutes as needed for chest pain. Lendon Colonel, NP Taking Active   Nutritional Supplements (GRAPESEED EXTRACT PO) [id number] Yes Take 100 mg by mouth daily. [provider] Taking Active   Probiotic Product (PROBIOTIC PO) [id number] Yes Take 1 capsule by mouth daily. [provider] Taking Active Self  propranolol (INDERAL) 60 MG tablet [id number] Yes TAKE ONE TABLET BY MOUTH EVERY MORNING and TAKE ONE TABLET BY MOUTH EVERY EVENING [name], MD Taking Active   testosterone (ANDROGEL) 50 MG/5GM (1%) GEL [id number] Yes apply FIVE grams (ONE PACKET) onto [place], William Alfred, MD Taking Active   Ubrogepant [facility]) 50 MG TABS [id number] Yes Take one at first sign of migraine. May repeat once in 2 hours. [name], MD Taking Active   zinc gluconate 50 MG tablet [id number] Yes Take 50 mg by mouth daily. [provider] Taking Active             Patient Active Problem List   Diagnosis Date Noted   Gastroesophageal reflux disease 09/07/2020   Allergy to honey bee venom 09/07/2020   History of gout 12/29/2019   Elevated cholesterol 12/29/2019   Healthcare maintenance 12/29/2019   Nonintractable headache 06/24/2019   Androgen deficiency 11/17/2017   Hypothyroidism 11/17/2017   Cyst of tonsil 06/11/2017   Obesity (BMI 30.0-34.9) 03/12/2017   CAD S/P  percutaneous coronary angioplasty 12/13/2016   NSTEMI (non-ST elevated myocardial infarction) ([place]) 12/11/2016   Gout of multiple sites 08/26/2016   Chronic renal impairment 08/26/2016   Chronic kidney disease 08/26/2016   Sjogren's  syndrome ([place]) 04/23/2016   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   Idiopathic chronic gout, unspecified site, without tophus (tophi) 04/23/2016   High risk medication use 04/23/2016   Primary osteoarthritis of both hands 04/23/2016   DDD (degenerative disc disease), lumbar 04/23/2016   Kidney disease 04/23/2016    Immunization History  Administered Date(s) Administered   Influenza-Unspecified 02/24/2018, 02/04/2019, 02/16/2020   PFIZER(Purple Top)SARS-COV-2 Vaccination 06/17/2019, 07/08/2019, 02/16/2020   Pneumococcal Conjugate-13 10/21/2013   Pneumococcal Polysaccharide-23 08/02/2008, 03/14/2016   Tdap 07/18/2019   Zoster Recombinat (Shingrix) 11/12/2017   Zoster, Live 08/28/2006    Conditions to be addressed/monitored:  {USCCMDZASSESSMENTOPTIONS:23563}  There are no care plans that you recently modified to display for this patient.    Medication Assistance: {MEDASSISTANCEINFO:25044}  Compliance/Adherence/Medication fill history: Care Gaps: ***  Star-Rating Drugs: ***  Patient's preferred pharmacy is:  [facility], [place] [address] Phone: [phone] Fax: [phone]  [facility] - [place], [place] - [address] [place] [address] Phone: [phone] Fax: [phone]  [place] [place] [address] Phone: [phone] Fax: [phone]  Uses pill box? {Yes or If no, why not?:20788} Pt endorses ***% compliance  We discussed: {Pharmacy options:24294} Patient decided to: {US Pharmacy Plan:23885}  Care Plan and Follow Up Patient Decision:  {FOLLOWUP:24991}  Plan:  {CM FOLLOW UP PLAN:25073}  ***

## 2020-12-11 ENCOUNTER — Other Ambulatory Visit: Payer: Self-pay | Admitting: Family Medicine

## 2020-12-11 DIAGNOSIS — E039 Hypothyroidism, unspecified: Secondary | ICD-10-CM

## 2020-12-18 ENCOUNTER — Telehealth: Payer: Self-pay

## 2020-12-18 NOTE — Progress Notes (Signed)
Chronic Care Management Pharmacy Assistant   Name: Dennis Moon  MRN: [id number] DOB: 04/14/1946  Reason for Encounter: Medication Review/Medication Coordination Call.   Recent office visits:  11/06/2020 Leroy Kennedy LPN (PCP Office) No medication changes noted  Recent consult visits:  No recent [place] [facility] visits:  None in previous 6 months  Medications: Outpatient Encounter Medications as of 12/18/2020  Medication Sig   allopurinol (ZYLOPRIM) 300 MG tablet TAKE ONE TABLET BY MOUTH EVERY EVENING   Ascorbic Acid (VITAMIN C) 1000 MG tablet Take 1,000 mg by mouth daily.   aspirin-acetaminophen-caffeine (EXCEDRIN EXTRA STRENGTH) 250-250-65 MG tablet Take by mouth every 6 (six) hours as needed for headache.   atorvastatin (LIPITOR) 80 MG tablet TAKE ONE TABLET BY MOUTH EVERY EVENING   Calcium Carb-Cholecalciferol (CALCIUM 600 + D PO) Take 1 tablet by mouth daily.   cholecalciferol (VITAMIN D) 25 MCG (1000 UNIT) tablet Take 1,000 Units by mouth daily.   Coenzyme Q10 300 MG CAPS Take 300 mg by mouth daily.   CRANBERRY EXTRACT PO Take 4,200 mg by mouth in the morning and at bedtime.    DULoxetine (CYMBALTA) 60 MG capsule TAKE ONE CAPSULE BY MOUTH EVERY EVENING   EPINEPHRINE 0.3 mg/0.3 mL IJ SOAJ injection USE AS DIRECTED   esomeprazole (NEXIUM) 20 MG capsule Take 1 capsule (20 mg total) by mouth 2 (two) times daily before a meal.   fluorometholone (FML) 0.1 % ophthalmic suspension Place 1 drop into both eyes as needed.   levocetirizine (XYZAL) 5 MG tablet Take 5 mg by mouth daily as needed for allergies.   levothyroxine (SYNTHROID) 112 MCG tablet TAKE ONE TABLET BY MOUTH EVERY EVENING   nitroGLYCERIN (NITROSTAT) 0.4 MG SL tablet Place 1 tablet (0.4 mg total) under the tongue every 5 (five) minutes as needed for chest pain.   Nutritional Supplements (GRAPESEED EXTRACT PO) Take 100 mg by mouth daily.   Probiotic Product (PROBIOTIC PO) Take 1 capsule by mouth daily.    propranolol (INDERAL) 60 MG tablet TAKE ONE TABLET BY MOUTH EVERY MORNING and TAKE ONE TABLET BY MOUTH EVERY EVENING   testosterone (ANDROGEL) 50 MG/5GM (1%) GEL apply FIVE grams (ONE PACKET) onto THE SKIN TWICE DAILY   Ubrogepant (UBRELVY) 50 MG TABS Take one at first sign of migraine. May repeat once in 2 hours.   zinc gluconate 50 MG tablet Take 50 mg by mouth daily.   No facility-administered encounter medications on file as of 12/18/2020.    Care Gaps: Shingrix Vaccine COVID-19 Vaccine Influenza Vaccine Star Rating Drugs: Atorvastatin 80 mg last refilled 11/21/2020 for a 30-Day supply at [facility] Medication Fill Gaps: N/A  Reviewed chart for medication changes ahead of medication coordination call.  BP Readings from Last 3 Encounters:  10/05/20 125/81  09/28/20 (!) 142/90  09/07/20 (!) 142/80    No results found for: HGBA1C   Patient obtains medications through Adherence Packaging  30 Days   Per [facility]: Last adherence delivery included:  Allopurinol 300 mg One  tablet daily - Evening meals  Duloxetine 60 mg One Capsule daily - Evening Meals  Levothyroxine 112 mcg  One Tablet Daily- Evening Meals    Propranolol 60 mg Tablet  Take 1 tablet by mouth twice Daily- Breakfast   & Evening Meals   Atorvastatin 80 mg  One Tablet daily - Evening Meals    Testosterone '50mg'$ /5g (1%) gel 1 packet BID  EPINEPHRINE 0.3 mg/0.3 mL IJ SOAJ injection PRN  Per [facility]: Patient declined medications last month: Alphagan 0.1% soln - Apply 1 drop to eye 2 (two) times daily Vitamin C 1000 mg daily  -OTC Excedrin Extra Strength  Every 6 hours PRN - Vial (OTC) Calcium carbonate 600 mg-OTC Vitamin D 1000 units  - OTC CoQ10 300 mg  - OTC Cranberry Extract 4200 mg - OTC Esomeprazole 20 mg BID  - OTC  Xyzal 5 mg daily PRN - Vial -OTC Magnesium Oxide 400 mg  - OTC Centrum Silver Mens  - OTC Grapeseed Extract  100 mg - OTC Probiotic Daily- OTC Turmeric (unknown  dose) - OTC  Zinc Gluconate 50 mg  - OTC  Patient is due for next adherence delivery on: 12/25/2020. Called patient and reviewed medications and coordinated delivery.  This delivery to include: Allopurinol 300 mg One  tablet daily - Evening meals  Duloxetine 60 mg One Capsule daily - Evening Meals  Levothyroxine 112 mcg  One Tablet Daily- Evening Meals    Propranolol 60 mg Tablet  Take 1 tablet by mouth twice Daily- Breakfast   & Evening Meals   Atorvastatin 80 mg  One Tablet daily - Evening Meals    Testosterone '50mg'$ /5g (1%) gel 1 packet BID    [facility] to coordinated acute fill for Fluorometholone 0.1% eye drops to be delivered 12/19/2020.  Patient declined the following medications  EPINEPHRINE 0.3 mg/0.3 mL IJ SOAJ injection PRN (Patient states he will inform me when he needs a refill) Alphagan 0.1% soln - Apply 1 drop to eye 2 (two) times daily (Not taking) Vitamin C 1000 mg daily  -OTC Excedrin Extra Strength  Every 6 hours PRN - Vial (OTC) Calcium carbonate 600 mg-OTC Vitamin D 1000 units  - OTC CoQ10 300 mg  - OTC Cranberry Extract 4200 mg - OTC Esomeprazole 20 mg BID  - OTC  Xyzal 5 mg daily PRN - Vial -OTC Magnesium Oxide 400 mg  - OTC Centrum Silver Mens  - OTC Grapeseed Extract  100 mg - OTC Probiotic Daily- OTC Turmeric (unknown dose) - OTC  Zinc Gluconate 50 mg  - OTC  Patient needs refills for Allopurinol 300 mg reached out to patient rheumatology to request refill on 12/19/2020 to go to [facility] (On hold with Rheumatology for 18 minutes).  Confirmed delivery date of 12/25/2020, advised patient that pharmacy will contact them the morning of delivery.  [place] Pharmacist Assistant [phone]

## 2020-12-19 ENCOUNTER — Other Ambulatory Visit: Payer: Self-pay | Admitting: Rheumatology

## 2020-12-19 MED ORDER — ALLOPURINOL 300 MG PO TABS: 300.0000 mg | ORAL_TABLET | Freq: Every evening | ORAL | 0 refills | Status: DC

## 2020-12-19 NOTE — Telephone Encounter (Signed)
Pharmacy calling requesting refill on Allopurinol.

## 2020-12-19 NOTE — Telephone Encounter (Signed)
Next Visit: 04/05/2021  Last Visit: 10/05/2020  Last Fill: 09/24/2020  DX: Idiopathic chronic gout of multiple sites without tophus  Current Dose per office note 10/05/2020: allopurinol '300mg'$  po qd.  Labs: 09/07/2020 CO2 35, BUN 24, GFR 55.51, Uric Acid 4.7  Okay to refill Allopurinol?

## 2020-12-24 ENCOUNTER — Encounter: Payer: Self-pay | Admitting: Family Medicine

## 2020-12-24 ENCOUNTER — Telehealth (INDEPENDENT_AMBULATORY_CARE_PROVIDER_SITE_OTHER): Payer: Medicare Other | Admitting: Family Medicine

## 2020-12-24 ENCOUNTER — Other Ambulatory Visit: Payer: Medicare Other

## 2020-12-24 DIAGNOSIS — J45909 Unspecified asthma, uncomplicated: Secondary | ICD-10-CM

## 2020-12-24 DIAGNOSIS — B349 Viral infection, unspecified: Secondary | ICD-10-CM

## 2020-12-24 MED ORDER — ALBUTEROL SULFATE HFA 108 (90 BASE) MCG/ACT IN AERS
1.0000 | INHALATION_SPRAY | Freq: Four times a day (QID) | RESPIRATORY_TRACT | 2 refills | Status: DC | PRN
Start: 2020-12-24 — End: 2021-06-20

## 2020-12-24 NOTE — Progress Notes (Signed)
Nuclear  Established Patient Office Visit  Subjective:  Patient ID: Dennis Moon, male    DOB: 28-Apr-1946  Age: 75 y.o. MRN: [id number]  CC:  Chief Complaint  Patient presents with   Cough    Dry crackly cough, chest congestion x 4-5 days. Negative for covid    HPI [name] presents for for evaluation of 5 day hocongestion, headache, pnd and head ache. PM wheezing. No asthma hx. No f/c, cough, diarrhea, alteration in taste or smell. Ho Covid earlier this year. Negative home test for covid.   Past Medical History:  Diagnosis Date   CAD S/P percutaneous coronary angioplasty 12/12/2016   a. 11/2016: NSTEMI with DES to LAD and DES to LCx   Gout    Hypertension    Hypothyroidism    Migraine    Sjogren's disease ([place])    Systemic lupus erythematosus ([place])     Past Surgical History:  Procedure Laterality Date   CATARACT EXTRACTION, BILATERAL Bilateral 2017   COLONOSCOPY  2021   CORONARY STENT INTERVENTION N/A 12/12/2016   Procedure: Coronary Stent Intervention;  Surgeon: Martinique, Peter M, MD;  Location: [facility];  Service: Cardiovascular;  Laterality: N/A;   HERNIA REPAIR     LEFT HEART CATH AND CORONARY ANGIOGRAPHY N/A 12/12/2016   Procedure: Left Heart Cath and Coronary Angiography;  Surgeon: Martinique, Peter M, MD;  Location: [facility];  Service: Cardiovascular;  Laterality: N/A;   THYROID SURGERY  1997    Family History  Problem Relation Age of Onset   Diabetes Mother    Hypertension Mother    Hypertension Father    Healthy Son     Social History   Socioeconomic History   Marital status: Married    Spouse name: Not on file   Number of children: Not on file   Years of education: Not on file   Highest education level: Not on file  Occupational History   Occupation: Dentist  Tobacco Use   Smoking status: Former    Packs/day: 1.00    Years: 6.00    Pack years: 6.00    Types: Cigarettes    Quit date: 04/25/1966    Years since quitting:  54.7   Smokeless tobacco: Never  Vaping Use   Vaping Use: Never used  Substance and Sexual Activity   Alcohol use: Yes    Alcohol/week: 7.0 standard drinks    Types: 7 Glasses of wine per week   Drug use: Never   Sexual activity: Not on file  Other Topics Concern   Not on file  Social History Narrative   Not on file   Social Determinants of Health   Financial Resource Strain: Low Risk    Difficulty of Paying Living Expenses: Not hard at all  Food Insecurity: No Food Insecurity   Worried About Charity fundraiser in the Last Year: Never true   [place] in the Last Year: Never true  Transportation Needs: No Transportation Needs   Lack of Transportation (Medical): No   Lack of Transportation (Non-Medical): No  Physical Activity: Sufficiently Active   Days of Exercise per Week: 4 days   Minutes of Exercise per Session: 40 min  Stress: No Stress Concern Present   Feeling of Stress : Not at all  Social Connections: Moderately Integrated   Frequency of Communication with Friends and Family: More than three times a week   Frequency of Social Gatherings with Friends and Family: Three  times a week   Attends Religious Services: More than 4 times per year   Active Member of Clubs or Organizations: No   Attends Archivist Meetings: Never   Marital Status: Married  Human resources officer Violence: Not At Risk   Fear of Current or Ex-Partner: No   Emotionally Abused: No   Physically Abused: No   Sexually Abused: No    Outpatient Medications Prior to Visit  Medication Sig Dispense Refill   allopurinol (ZYLOPRIM) 300 MG tablet Take 1 tablet (300 mg total) by mouth every evening. 90 tablet 0   Ascorbic Acid (VITAMIN C) 1000 MG tablet Take 1,000 mg by mouth daily.     aspirin-acetaminophen-caffeine (EXCEDRIN EXTRA STRENGTH) 250-250-65 MG tablet Take by mouth every 6 (six) hours as needed for headache.     atorvastatin (LIPITOR) 80 MG tablet TAKE ONE TABLET BY MOUTH EVERY  EVENING 90 tablet 3   Calcium Carb-Cholecalciferol (CALCIUM 600 + D PO) Take 1 tablet by mouth daily.     cholecalciferol (VITAMIN D) 25 MCG (1000 UNIT) tablet Take 1,000 Units by mouth daily.     Coenzyme Q10 300 MG CAPS Take 300 mg by mouth daily.     CRANBERRY EXTRACT PO Take 4,200 mg by mouth in the morning and at bedtime.      DULoxetine (CYMBALTA) 60 MG capsule TAKE ONE CAPSULE BY MOUTH EVERY EVENING 90 capsule 0   EPINEPHRINE 0.3 mg/0.3 mL IJ SOAJ injection USE AS DIRECTED 2 each 2   esomeprazole (NEXIUM) 20 MG capsule Take 1 capsule (20 mg total) by mouth 2 (two) times daily before a meal. 90 capsule 3   fluorometholone (FML) 0.1 % ophthalmic suspension Place 1 drop into both eyes as needed.     levocetirizine (XYZAL) 5 MG tablet Take 5 mg by mouth daily as needed for allergies.     levothyroxine (SYNTHROID) 112 MCG tablet TAKE ONE TABLET BY MOUTH EVERY EVENING 90 tablet 2   Probiotic Product (PROBIOTIC PO) Take 1 capsule by mouth daily.     propranolol (INDERAL) 60 MG tablet TAKE ONE TABLET BY MOUTH EVERY MORNING and TAKE ONE TABLET BY MOUTH EVERY EVENING 180 tablet 4   testosterone (ANDROGEL) 50 MG/5GM (1%) GEL apply FIVE grams (ONE PACKET) onto THE SKIN TWICE DAILY 300 g 4   Ubrogepant (UBRELVY) 50 MG TABS Take one at first sign of migraine. May repeat once in 2 hours. 10 tablet 2   zinc gluconate 50 MG tablet Take 50 mg by mouth daily.     nitroGLYCERIN (NITROSTAT) 0.4 MG SL tablet Place 1 tablet (0.4 mg total) under the tongue every 5 (five) minutes as needed for chest pain. 25 tablet 2   Nutritional Supplements (GRAPESEED EXTRACT PO) Take 100 mg by mouth daily.     No facility-administered medications prior to visit.    Allergies  Allergen Reactions   Bee Venom Swelling    ROS Review of Systems  Constitutional:  Negative for chills and fever.  HENT:  Positive for congestion, postnasal drip and sneezing. Negative for sore throat.   Respiratory:  Positive for wheezing.  Negative for cough and shortness of breath.   Gastrointestinal:  Negative for diarrhea.  Musculoskeletal:  Negative for arthralgias and myalgias.  Neurological:  Positive for headaches.  Psychiatric/Behavioral: Negative.       Objective:    Physical Exam Constitutional:      Appearance: Normal appearance.  HENT:     Head: Normocephalic and atraumatic.  Pulmonary:  Effort: Pulmonary effort is normal.  Neurological:     Mental Status: He is alert and oriented to person, place, and time.  Psychiatric:        Mood and Affect: Mood normal.        Behavior: Behavior normal.    There were no vitals taken for this visit. Wt Readings from Last 3 Encounters:  10/05/20 242 lb (109.8 kg)  09/28/20 238 lb 12.8 oz (108.3 kg)  09/07/20 242 lb 3.2 oz (109.9 kg)     Health Maintenance Due  Topic Date Due   Zoster Vaccines- Shingrix (2 of 2) 01/07/2018   INFLUENZA VACCINE  12/17/2020    There are no preventive care reminders to display for this patient.  Lab Results  Component Value Date   TSH 2.56 09/07/2020   Lab Results  Component Value Date   WBC 5.6 09/07/2020   HGB 14.7 09/07/2020   HCT 44.9 09/07/2020   MCV 93.6 09/07/2020   PLT 172.0 09/07/2020   Lab Results  Component Value Date   NA 141 09/07/2020   K 4.0 09/07/2020   CO2 35 (H) 09/07/2020   GLUCOSE 91 09/07/2020   BUN 24 (H) 09/07/2020   CREATININE 1.26 09/07/2020   BILITOT 0.5 09/07/2020   ALKPHOS 88 09/07/2020   AST 24 09/07/2020   ALT 29 09/07/2020   PROT 7.1 09/07/2020   ALBUMIN 3.8 09/07/2020   CALCIUM 8.8 09/07/2020   ANIONGAP 9 12/13/2016   GFR 55.51 (L) 09/07/2020   Lab Results  Component Value Date   CHOL 124 09/07/2020   Lab Results  Component Value Date   HDL 42.20 09/07/2020   Lab Results  Component Value Date   LDLCALC 57 09/07/2020   Lab Results  Component Value Date   TRIG 123.0 09/07/2020   Lab Results  Component Value Date   CHOLHDL 3 09/07/2020   No results found  for: HGBA1C    Assessment & Plan:   Problem List Items Addressed This Visit   None Visit Diagnoses     Viral syndrome    -  Primary   Relevant Orders   Novel Coronavirus, NAA ([facility])   Mild reactive airways disease, unspecified whether persistent       Relevant Medications   albuterol (VENTOLIN HFA) 108 (90 Base) MCG/ACT inhaler       Meds ordered this encounter  Medications   albuterol (VENTOLIN HFA) 108 (90 Base) MCG/ACT inhaler    Sig: Inhale 1 puff into the lungs every 6 (six) hours as needed for wheezing or shortness of breath.    Dispense:  8 g    Refill:  2     Follow-up: Return Call Thursday prn.[name], MD  Virtual Visit via Video Note  I connected with [name] on 12/24/20 at 10:30 AM EDT by a video enabled telemedicine application and verified that I am speaking with the correct person using two identifiers.  Location: Patient: at work alone in his office  Provider: work   I discussed the limitations of evaluation and management by telemedicine and the availability of in person appointments. The patient expressed understanding and agreed to proceed.  History of Present Illness:    Observations/Objective:   Assessment and Plan:   Follow Up Instructions:    I discussed the assessment and treatment plan with the patient. The patient was provided an opportunity to ask questions and all were answered. The patient agreed with the plan and  demonstrated an understanding of the instructions.   The patient was advised to call back or seek an in-person evaluation if the symptoms worsen or if the condition fails to improve as anticipated.  I provided 23 minutes of non-face-to-face time during this encounter.   [name], MD

## 2020-12-24 NOTE — Addendum Note (Signed)
Addended by: Lynnea Ferrier on: 12/24/2020 02:38 PM   Modules accepted: Orders

## 2020-12-27 LAB — SARS-COV-2, NAA 2 DAY TAT

## 2020-12-27 LAB — NOVEL CORONAVIRUS, NAA: SARS-CoV-2, NAA: NOT DETECTED

## 2021-01-04 DIAGNOSIS — L821 Other seborrheic keratosis: Secondary | ICD-10-CM | POA: Diagnosis not present

## 2021-01-04 DIAGNOSIS — D2261 Melanocytic nevi of right upper limb, including shoulder: Secondary | ICD-10-CM | POA: Diagnosis not present

## 2021-01-04 DIAGNOSIS — D2262 Melanocytic nevi of left upper limb, including shoulder: Secondary | ICD-10-CM | POA: Diagnosis not present

## 2021-01-04 DIAGNOSIS — L814 Other melanin hyperpigmentation: Secondary | ICD-10-CM | POA: Diagnosis not present

## 2021-01-04 DIAGNOSIS — D485 Neoplasm of uncertain behavior of skin: Secondary | ICD-10-CM | POA: Diagnosis not present

## 2021-01-04 DIAGNOSIS — D1801 Hemangioma of skin and subcutaneous tissue: Secondary | ICD-10-CM | POA: Diagnosis not present

## 2021-01-18 ENCOUNTER — Telehealth: Payer: Self-pay

## 2021-01-18 NOTE — Progress Notes (Signed)
Chronic Care Management Pharmacy Assistant   Name: Dennis Moon  MRN: [id number] DOB: 06-12-1945  Reason for Encounter: Medication Review/Medication Coordination Call.   Recent office visits:  12/24/2020 Dr.Kremer MD (PCP) start Albuterol 108 MCG/ACT PRN  Recent consult visits:  No recent [facility] visits:  None in previous 6 months  Medications: Outpatient Encounter Medications as of 01/18/2021  Medication Sig Note   albuterol (VENTOLIN HFA) 108 (90 Base) MCG/ACT inhaler Inhale 1 puff into the lungs every 6 (six) hours as needed for wheezing or shortness of breath.    allopurinol (ZYLOPRIM) 300 MG tablet Take 1 tablet (300 mg total) by mouth every evening.    Ascorbic Acid (VITAMIN C) 1000 MG tablet Take 1,000 mg by mouth daily.    aspirin-acetaminophen-caffeine (EXCEDRIN EXTRA STRENGTH) 250-250-65 MG tablet Take by mouth every 6 (six) hours as needed for headache.    atorvastatin (LIPITOR) 80 MG tablet TAKE ONE TABLET BY MOUTH EVERY EVENING    Calcium Carb-Cholecalciferol (CALCIUM 600 + D PO) Take 1 tablet by mouth daily.    cholecalciferol (VITAMIN D) 25 MCG (1000 UNIT) tablet Take 1,000 Units by mouth daily.    Coenzyme Q10 300 MG CAPS Take 300 mg by mouth daily.    CRANBERRY EXTRACT PO Take 4,200 mg by mouth in the morning and at bedtime.     DULoxetine (CYMBALTA) 60 MG capsule TAKE ONE CAPSULE BY MOUTH EVERY EVENING    EPINEPHRINE 0.3 mg/0.3 mL IJ SOAJ injection USE AS DIRECTED    esomeprazole (NEXIUM) 20 MG capsule Take 1 capsule (20 mg total) by mouth 2 (two) times daily before a meal.    fluorometholone (FML) 0.1 % ophthalmic suspension Place 1 drop into both eyes as needed. 12/24/2020: prn   levocetirizine (XYZAL) 5 MG tablet Take 5 mg by mouth daily as needed for allergies.    levothyroxine (SYNTHROID) 112 MCG tablet TAKE ONE TABLET BY MOUTH EVERY EVENING    Probiotic Product (PROBIOTIC PO) Take 1 capsule by mouth daily.    propranolol (INDERAL) 60 MG  tablet TAKE ONE TABLET BY MOUTH EVERY MORNING and TAKE ONE TABLET BY MOUTH EVERY EVENING    testosterone (ANDROGEL) 50 MG/5GM (1%) GEL apply FIVE grams (ONE PACKET) onto THE SKIN TWICE DAILY    Ubrogepant (UBRELVY) 50 MG TABS Take one at first sign of migraine. May repeat once in 2 hours. 12/24/2020: Prn    zinc gluconate 50 MG tablet Take 50 mg by mouth daily.    No facility-administered encounter medications on file as of 01/18/2021.    Care Gaps: Shingrix Vaccine Influenza Vaccine Star Rating Drugs: Atorvastatin 80 mg last refilled 12/20/2020 for a 30-Day supply at [facility] Medication Fill Gaps: N/A  Reviewed chart for medication changes ahead of medication coordination call.  BP Readings from Last 3 Encounters:  10/05/20 125/81  09/28/20 (!) 142/90  09/07/20 (!) 142/80    No results found for: HGBA1C   Patient obtains medications through Adherence Packaging  30 Days   Last adherence delivery included:  Allopurinol 300 mg One  tablet daily - Evening meals  Duloxetine 60 mg One Capsule daily - Evening Meals  Levothyroxine 112 mcg  One Tablet Daily- Evening Meals    Propranolol 60 mg Tablet  Take 1 tablet by mouth twice Daily- Breakfast   & Evening Meals   Atorvastatin 80 mg  One Tablet daily - Evening Meals    Testosterone '50mg'$ /5g (1%) gel 1 packet BID  [facility] to coordinated acute fill for Fluorometholone 0.1% eye drops to be delivered 12/19/2020.  Patient declined medication last month  EPINEPHRINE 0.3 mg/0.3 mL IJ SOAJ injection PRN (Patient states he will inform me when he needs a refill) Alphagan 0.1% soln - Apply 1 drop to eye 2 (two) times daily (Not taking) Vitamin C 1000 mg daily  -OTC Excedrin Extra Strength  Every 6 hours PRN - Vial (OTC) Calcium carbonate 600 mg-OTC Vitamin D 1000 units  - OTC CoQ10 300 mg  - OTC Cranberry Extract 4200 mg - OTC Esomeprazole 20 mg BID  - OTC  Xyzal 5 mg daily PRN - Vial -OTC Magnesium  Oxide 400 mg  - OTC Centrum Silver Mens  - OTC Grapeseed Extract  100 mg - OTC Probiotic Daily- OTC Turmeric (unknown dose) - OTC  Zinc Gluconate 50 mg  - OTC  Patient is due for next adherence delivery on: 01/29/2021. Called patient and reviewed medications and coordinated delivery.  I have attempted without success to contact this patient by phone to do his Medication Coordination e call. I left a Voice message for patient to return my call.[facility] will reach out to patient to set up delivery for patient medication refills.  [place] Pharmacist Assistant [phone]

## 2021-01-25 DIAGNOSIS — D3132 Benign neoplasm of left choroid: Secondary | ICD-10-CM | POA: Diagnosis not present

## 2021-02-15 ENCOUNTER — Telehealth: Payer: Self-pay

## 2021-02-15 NOTE — Progress Notes (Signed)
Chronic Care Management Pharmacy Assistant   Name: HASTEN SWEITZER  MRN: [id number] DOB: Sep 22, 1945  Reason for Encounter: Medication Review/Medication Coordination Call.   Recent office visits:  No recent office Visit  Recent consult visits:  No recent [facility] visits:  None in previous 6 months  Medications: Outpatient Encounter Medications as of 02/15/2021  Medication Sig Note   albuterol (VENTOLIN HFA) 108 (90 Base) MCG/ACT inhaler Inhale 1 puff into the lungs every 6 (six) hours as needed for wheezing or shortness of breath.    allopurinol (ZYLOPRIM) 300 MG tablet Take 1 tablet (300 mg total) by mouth every evening.    Ascorbic Acid (VITAMIN C) 1000 MG tablet Take 1,000 mg by mouth daily.    aspirin-acetaminophen-caffeine (EXCEDRIN EXTRA STRENGTH) 250-250-65 MG tablet Take by mouth every 6 (six) hours as needed for headache.    atorvastatin (LIPITOR) 80 MG tablet TAKE ONE TABLET BY MOUTH EVERY EVENING    Calcium Carb-Cholecalciferol (CALCIUM 600 + D PO) Take 1 tablet by mouth daily.    cholecalciferol (VITAMIN D) 25 MCG (1000 UNIT) tablet Take 1,000 Units by mouth daily.    Coenzyme Q10 300 MG CAPS Take 300 mg by mouth daily.    CRANBERRY EXTRACT PO Take 4,200 mg by mouth in the morning and at bedtime.     DULoxetine (CYMBALTA) 60 MG capsule TAKE ONE CAPSULE BY MOUTH EVERY EVENING    EPINEPHRINE 0.3 mg/0.3 mL IJ SOAJ injection USE AS DIRECTED    esomeprazole (NEXIUM) 20 MG capsule Take 1 capsule (20 mg total) by mouth 2 (two) times daily before a meal.    fluorometholone (FML) 0.1 % ophthalmic suspension Place 1 drop into both eyes as needed. 12/24/2020: prn   levocetirizine (XYZAL) 5 MG tablet Take 5 mg by mouth daily as needed for allergies.    levothyroxine (SYNTHROID) 112 MCG tablet TAKE ONE TABLET BY MOUTH EVERY EVENING    Probiotic Product (PROBIOTIC PO) Take 1 capsule by mouth daily.    propranolol (INDERAL) 60 MG tablet TAKE ONE TABLET BY MOUTH EVERY  MORNING and TAKE ONE TABLET BY MOUTH EVERY EVENING    testosterone (ANDROGEL) 50 MG/5GM (1%) GEL apply FIVE grams (ONE PACKET) onto THE SKIN TWICE DAILY    Ubrogepant (UBRELVY) 50 MG TABS Take one at first sign of migraine. May repeat once in 2 hours. 12/24/2020: Prn    zinc gluconate 50 MG tablet Take 50 mg by mouth daily.    No facility-administered encounter medications on file as of 02/15/2021.    Care Gaps: Shingrix Vaccine Influenza Vaccine Star Rating Drugs: Atorvastatin 80 mg last refilled 01/25/2021 for a 30-Day supply at [facility] Medication Fill Gaps: None  Reviewed chart for medication changes ahead of medication coordination call.  BP Readings from Last 3 Encounters:  10/05/20 125/81  09/28/20 (!) 142/90  09/07/20 (!) 142/80    No results found for: HGBA1C   Patient obtains medications through Adherence Packaging  30 Days   [facility] to coordinated  last medication delivery.Per [facility] data:  Last adherence delivery included:  Allopurinol 300 mg One  tablet daily - Evening meals  Duloxetine 60 mg One Capsule daily - Evening Meals  Levothyroxine 112 mcg  One Tablet Daily- Evening Meals    Propranolol 60 mg Tablet  Take 1 tablet by mouth twice Daily- Breakfast   & Evening Meals   Atorvastatin 80 mg  One Tablet daily - Evening Meals  Testosterone 50mg /5g (1%) gel 1 packet apply five grams (ONE PACKET) onto the skin twice daily  Fluorometholone 0.1% eye drops-  Place 1 drop into both eyes as needed  Patient declined medications last month  EPINEPHRINE 0.3 mg/0.3 mL IJ SOAJ injection PRN (Patient states he will inform me when he needs a refill) Albuterol 108  MCG/ACT inhaler  1 puff into the lungs every 6 (six) hours as needed for wheezing or shortness of breath Vitamin C 1000 mg daily  -OTC Excedrin Extra Strength  Every 6 hours PRN - Vial (OTC) Calcium carbonate 600 mg-OTC Vitamin D 1000 units  - OTC CoQ10 300 mg  -  OTC Cranberry Extract 4200 mg - OTC Esomeprazole 20 mg BID  - OTC  Xyzal 5 mg daily PRN - Vial -OTC Magnesium Oxide 400 mg  - OTC Centrum Silver Mens  - OTC Grapeseed Extract  100 mg - OTC Probiotic Daily- OTC Turmeric (unknown dose) - OTC  Zinc Gluconate 50 mg  - OTC  Patient is due for next adherence delivery on: 02/27/2021. Called patient and reviewed medications and coordinated delivery.  Unable to reach patient to completed Medication Coordination form. Form was completed based on last month delivery. [facility] will contact patient to confirm delivery.Junius Argyle, CPP was notified I was unable to reach patient.LVM 09/30,10/03,10/04  This delivery to include: Allopurinol 300 mg One  tablet daily - Evening meals  Duloxetine 60 mg One Capsule daily - Evening Meals  Levothyroxine 112 mcg  One Tablet Daily- Evening Meals    Propranolol 60 mg Tablet  Take 1 tablet by mouth twice Daily- Breakfast   & Evening Meals   Atorvastatin 80 mg  One Tablet daily - Evening Meals    Testosterone 50mg /5g (1%) gel 1 packet apply five grams (ONE PACKET) onto the skin twice daily  Fluorometholone 0.1% eye drops-  Place 1 drop into both eyes as needed  Patient declined the following medications: EPINEPHRINE 0.3 mg/0.3 mL IJ SOAJ injection PRN (Patient states he will inform me when he needs a refill) Albuterol 108  MCG/ACT inhaler  1 puff into the lungs every 6 (six) hours as needed for wheezing or shortness of breath Vitamin C 1000 mg daily  -OTC Excedrin Extra Strength  Every 6 hours PRN - Vial (OTC) Calcium carbonate 600 mg-OTC Vitamin D 1000 units  - OTC CoQ10 300 mg  - OTC Cranberry Extract 4200 mg - OTC Esomeprazole 20 mg BID  - OTC  Xyzal 5 mg daily PRN - Vial -OTC Magnesium Oxide 400 mg  - OTC Centrum Silver Mens  - OTC Grapeseed Extract  100 mg - OTC Probiotic Daily- OTC Turmeric (unknown dose) - OTC  Zinc Gluconate 50 mg  - OTC  Patient needs refills for  Duloxetine 60  mg.  Confirmed delivery date of 02/27/2021, advised patient that pharmacy will contact them the morning of delivery.  [place] Pharmacist Assistant [phone]

## 2021-02-20 ENCOUNTER — Other Ambulatory Visit: Payer: Self-pay | Admitting: Family Medicine

## 2021-03-15 NOTE — Progress Notes (Deleted)
Office Visit Note  Patient: Dennis Moon             Date of Birth: September 05, 1945           MRN: [id number]             PCP: [name], MD Referring: [name],* Visit Date: 03/29/2021 Occupation: @GUAROCC @  Subjective:    History of Present Illness: Dennis Moon is a 75 y.o. male with history of sjogren's syndrome, subcutaneous lupus, osteoarthritis, and gout.   He remains on allopurinol 300 mg by mouth daily for management of gout.  Uric acid was within the desirable range-4.7 on 09/07/20.  He is due to update CBC, CMP, and uric acid today. Orders released.    Activities of Daily Living:  Patient reports morning stiffness for *** {minute/hour:19697}.   Patient {ACTIONS;DENIES/REPORTS:21021675::"Denies"} nocturnal pain.  Difficulty dressing/grooming: {ACTIONS;DENIES/REPORTS:21021675::"Denies"} Difficulty climbing stairs: {ACTIONS;DENIES/REPORTS:21021675::"Denies"} Difficulty getting out of chair: {ACTIONS;DENIES/REPORTS:21021675::"Denies"} Difficulty using hands for taps, buttons, cutlery, and/or writing: {ACTIONS;DENIES/REPORTS:21021675::"Denies"}  No Rheumatology ROS completed.   PMFS History:  Patient Active Problem List   Diagnosis Date Noted  . Gastroesophageal reflux disease 09/07/2020  . Allergy to honey bee venom 09/07/2020  . History of gout 12/29/2019  . Elevated cholesterol 12/29/2019  . Healthcare maintenance 12/29/2019  . Nonintractable headache 06/24/2019  . Androgen deficiency 11/17/2017  . Hypothyroidism 11/17/2017  . Cyst of tonsil 06/11/2017  . Obesity (BMI 30.0-34.9) 03/12/2017  . CAD S/P percutaneous coronary angioplasty 12/13/2016  . NSTEMI (non-ST elevated myocardial infarction) ([place]) 12/11/2016  . Gout of multiple sites 08/26/2016  . Chronic renal impairment 08/26/2016  . Chronic kidney disease 08/26/2016  . Sjogren's syndrome ([place]) 04/23/2016  . 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 Idiopathic chronic gout, unspecified  site, without tophus (tophi) 04/23/2016  . High risk medication use 04/23/2016  . Primary osteoarthritis of both hands 04/23/2016  . DDD (degenerative disc disease), lumbar 04/23/2016  . Kidney disease 04/23/2016    Past Medical History:  Diagnosis Date  . CAD S/P percutaneous coronary angioplasty 12/12/2016   a. 11/2016: NSTEMI with DES to LAD and DES to LCx  . Gout   . Hypertension   . Hypothyroidism   . Migraine   . Sjogren's disease ([place])   . Systemic lupus erythematosus ([facility])     Family History  Problem Relation Age of Onset  . Diabetes Mother   . Hypertension Mother   . Hypertension Father   . Healthy Son    Past Surgical History:  Procedure Laterality Date  . CATARACT EXTRACTION, BILATERAL Bilateral 2017  . COLONOSCOPY  2021  . CORONARY STENT INTERVENTION N/A 12/12/2016   Procedure: Coronary Stent Intervention;  Surgeon: Martinique, Peter M, MD;  Location: [facility];  Service: Cardiovascular;  Laterality: N/A;  . HERNIA REPAIR    . LEFT HEART CATH AND CORONARY ANGIOGRAPHY N/A 12/12/2016   Procedure: Left Heart Cath and Coronary Angiography;  Surgeon: Martinique, Peter M, MD;  Location: [facility];  Service: Cardiovascular;  Laterality: N/A;  . THYROID SURGERY  1997   Social History   Social History Narrative  . Not on file   Immunization History  Administered Date(s) Administered  . Influenza-Unspecified 02/24/2018, 02/04/2019, 02/16/2020  . PFIZER(Purple Top)SARS-COV-2 Vaccination 06/17/2019, 07/08/2019, 02/16/2020  . Pneumococcal Conjugate-13 10/21/2013  . Pneumococcal Polysaccharide-23 08/02/2008, 03/14/2016  . Tdap 07/18/2019  . Zoster Recombinat (Shingrix) 11/12/2017  . Zoster, Live 08/28/2006     Objective: Vital Signs: There were no vitals  taken for this visit.   Physical Exam Vitals and nursing note reviewed.  Constitutional:      Appearance: He is well-developed.  HENT:     Head: Normocephalic and atraumatic.  Eyes:      Conjunctiva/sclera: Conjunctivae normal.     Pupils: Pupils are equal, round, and reactive to light.  Pulmonary:     Effort: Pulmonary effort is normal.  Abdominal:     Palpations: Abdomen is soft.  Musculoskeletal:     Cervical back: Normal range of motion and neck supple.  Skin:    General: Skin is warm and dry.     Capillary Refill: Capillary refill takes less than 2 seconds.  Neurological:     Mental Status: He is alert and oriented to person, place, and time.  Psychiatric:        Behavior: Behavior normal.     Musculoskeletal Exam: ***  CDAI Exam: CDAI Score: -- Patient Global: --; Provider Global: -- Swollen: --; Tender: -- Joint Exam 03/29/2021   No joint exam has been documented for this visit   There is currently no information documented on the homunculus. Go to the Rheumatology activity and complete the homunculus joint exam.  Investigation: No additional findings.  Imaging: No results found.  Recent Labs: Lab Results  Component Value Date   WBC 5.6 09/07/2020   HGB 14.7 09/07/2020   PLT 172.0 09/07/2020   NA 141 09/07/2020   K 4.0 09/07/2020   CL 101 09/07/2020   CO2 35 (H) 09/07/2020   GLUCOSE 91 09/07/2020   BUN 24 (H) 09/07/2020   CREATININE 1.26 09/07/2020   BILITOT 0.5 09/07/2020   ALKPHOS 88 09/07/2020   AST 24 09/07/2020   ALT 29 09/07/2020   PROT 7.1 09/07/2020   ALBUMIN 3.8 09/07/2020   CALCIUM 8.8 09/07/2020   GFRAA 59 (L) 07/01/2019    Speciality Comments: PLQ eye exam normal on 05/07/18  @ [facility].  Procedures:  No procedures performed Allergies: Bee venom   Assessment / Plan:     Visit Diagnoses: No diagnosis found.  Orders: No orders of the defined types were placed in this encounter.  No orders of the defined types were placed in this encounter.   Face-to-face time spent with patient was *** minutes. Greater than 50% of time was spent in counseling and coordination of care.  Follow-Up Instructions: No  follow-ups on file.   Earnestine Mealing, CMA  Note - This record has been created using Editor, commissioning.  Chart creation errors have been sought, but may not always  have been located. Such creation errors do not reflect on  the standard of medical care.

## 2021-03-18 ENCOUNTER — Telehealth: Payer: Self-pay

## 2021-03-18 NOTE — Progress Notes (Signed)
Chronic Care Management Pharmacy Assistant   Name: Dennis Moon  MRN: [id number] DOB: 1945/11/26  Reason for Encounter: Medication Review/Medication Coordination Call   Recent office visits:  No recent office visit  Recent consult visits:  No recent consult visit  Hospital visits:  None in previous 6 months  Medications: Outpatient Encounter Medications as of 03/18/2021  Medication Sig Note   albuterol (VENTOLIN HFA) 108 (90 Base) MCG/ACT inhaler Inhale 1 puff into the lungs every 6 (six) hours as needed for wheezing or shortness of breath.    allopurinol (ZYLOPRIM) 300 MG tablet Take 1 tablet (300 mg total) by mouth every evening.    Ascorbic Acid (VITAMIN C) 1000 MG tablet Take 1,000 mg by mouth daily.    aspirin-acetaminophen-caffeine (EXCEDRIN EXTRA STRENGTH) 250-250-65 MG tablet Take by mouth every 6 (six) hours as needed for headache.    atorvastatin (LIPITOR) 80 MG tablet TAKE ONE TABLET BY MOUTH EVERY EVENING    Calcium Carb-Cholecalciferol (CALCIUM 600 + D PO) Take 1 tablet by mouth daily.    cholecalciferol (VITAMIN D) 25 MCG (1000 UNIT) tablet Take 1,000 Units by mouth daily.    Coenzyme Q10 300 MG CAPS Take 300 mg by mouth daily.    CRANBERRY EXTRACT PO Take 4,200 mg by mouth in the morning and at bedtime.     DULoxetine (CYMBALTA) 60 MG capsule TAKE ONE CAPSULE BY MOUTH EVERY EVENING    EPINEPHRINE 0.3 mg/0.3 mL IJ SOAJ injection USE AS DIRECTED    esomeprazole (NEXIUM) 20 MG capsule Take 1 capsule (20 mg total) by mouth 2 (two) times daily before a meal.    fluorometholone (FML) 0.1 % ophthalmic suspension Place 1 drop into both eyes as needed. 12/24/2020: prn   levocetirizine (XYZAL) 5 MG tablet Take 5 mg by mouth daily as needed for allergies.    levothyroxine (SYNTHROID) 112 MCG tablet TAKE ONE TABLET BY MOUTH EVERY EVENING    Probiotic Product (PROBIOTIC PO) Take 1 capsule by mouth daily.    propranolol (INDERAL) 60 MG tablet TAKE ONE TABLET BY MOUTH EVERY  MORNING and TAKE ONE TABLET BY MOUTH EVERY EVENING    testosterone (ANDROGEL) 50 MG/5GM (1%) GEL apply FIVE grams (ONE PACKET) onto THE SKIN TWICE DAILY    Ubrogepant (UBRELVY) 50 MG TABS Take one at first sign of migraine. May repeat once in 2 hours. 12/24/2020: Prn    zinc gluconate 50 MG tablet Take 50 mg by mouth daily.    No facility-administered encounter medications on file as of 03/18/2021.    Care Gaps: Shingrix Vaccine Influenza Vaccine Star Rating Drugs: Atorvastatin 80 mg last refilled 02/20/2021 for a 30-Day supply at [facility] Medication Fill Gaps: None  Reviewed chart for medication changes ahead of medication coordination call.  BP Readings from Last 3 Encounters:  10/05/20 125/81  09/28/20 (!) 142/90  09/07/20 (!) 142/80    No results found for: HGBA1C   Patient obtains medications through Adherence Packaging  30 Days   Per [facility] pharmacy data:  Last adherence delivery included:  Allopurinol 300 mg One  tablet daily - Evening meals  Duloxetine 60 mg One Capsule daily - Evening Meals  Levothyroxine 112 mcg  One Tablet Daily- Evening Meals    Propranolol 60 mg Tablet  Take 1 tablet by mouth twice Daily- Breakfast   & Evening Meals   Atorvastatin 80 mg  One Tablet daily - Evening Meals  Fluorometholone 0.1% eye drops-  Place 1 drop into both eyes  as needed Albuterol 108  MCG/ACT inhaler  1 puff into the lungs every 6 (six) hours as needed for wheezing or shortness of breath  Patient declined medication last month: EPINEPHRINE 0.3 mg/0.3 mL IJ SOAJ injection PRN (Patient states he will inform me when he needs a refill)  Testosterone 50mg /5g (1%) gel 1 packet apply five grams (ONE PACKET) onto the skin twice daily  Vitamin C 1000 mg daily  -OTC Excedrin Extra Strength  Every 6 hours PRN - Vial (OTC) Calcium carbonate 600 mg-OTC Vitamin D 1000 units  - OTC CoQ10 300 mg  - OTC Cranberry Extract 4200 mg - OTC Esomeprazole 20 mg BID  - OTC  Xyzal 5 mg  daily PRN - Vial -OTC Magnesium Oxide 400 mg  - OTC Centrum Silver Mens  - OTC Grapeseed Extract  100 mg - OTC Probiotic Daily- OTC Turmeric (unknown dose) - OTC  Zinc Gluconate 50 mg  - OTC  Patient is due for next adherence delivery on: 03/28/2021. Called patient and reviewed medications and coordinated delivery.  Unable to reach patient to completed Medication Coordination form. Form was completed based on last month delivery. [facility] will contact patient to confirm delivery.Junius Argyle, CPP was notified I was unable to reach patient  This delivery to include: Allopurinol 300 mg One  tablet daily - Evening meals  Duloxetine 60 mg One Capsule daily - Evening Meals  Levothyroxine 112 mcg  One Tablet Daily- Evening Meals    Propranolol 60 mg Tablet  Take 1 tablet by mouth twice Daily- Breakfast   & Evening Meals   Atorvastatin 80 mg  One Tablet daily - Evening Meals  Fluorometholone 0.1% eye drops-  Place 1 drop into both eyes as needed  Patient declined the following medications  Albuterol 108  MCG/ACT inhaler  1 puff into the lungs every 6 (six) hours as needed for wheezing or shortness of breath EPINEPHRINE 0.3 mg/0.3 mL IJ SOAJ injection PRN (Patient states he will inform me when he needs a refill)  Testosterone 50mg /5g (1%) gel 1 packet apply five grams (ONE PACKET) onto the skin twice daily  Vitamin C 1000 mg daily  -OTC Excedrin Extra Strength  Every 6 hours PRN - Vial (OTC) Calcium carbonate 600 mg-OTC Vitamin D 1000 units  - OTC CoQ10 300 mg  - OTC Cranberry Extract 4200 mg - OTC Esomeprazole 20 mg BID  - OTC  Xyzal 5 mg daily PRN - Vial -OTC Magnesium Oxide 400 mg  - OTC Centrum Silver Mens  - OTC Grapeseed Extract  100 mg - OTC Probiotic Daily- OTC Turmeric (unknown dose) - OTC  Zinc Gluconate 50 mg  - OTC  Patient needs refills for Allopurinol .  Confirmed delivery date of 03/28/2021, advised patient that pharmacy will contact them the morning of  delivery.  [place] Pharmacist Assistant [phone]

## 2021-03-26 ENCOUNTER — Other Ambulatory Visit: Payer: Self-pay | Admitting: Physician Assistant

## 2021-03-26 NOTE — Telephone Encounter (Signed)
Next Visit: 03/29/2021  Last Visit: 10/05/2020  Last Fill: 12/19/2020  DX: Idiopathic chronic gout of multiple sites without tophus  Current Dose per office note 10/05/2020: allopurinol 300mg  po qd  Labs: 09/07/2020, Uric acid is in desirable range.   Okay to refill Allopurinol?

## 2021-03-29 ENCOUNTER — Ambulatory Visit: Payer: Medicare Other | Admitting: Physician Assistant

## 2021-03-29 ENCOUNTER — Encounter (HOSPITAL_COMMUNITY)
Admission: EM | Disposition: A | Discharge status: Home / Self Care | Payer: Self-pay | Source: Home / Self Care | Attending: Emergency Medicine

## 2021-03-29 ENCOUNTER — Observation Stay (HOSPITAL_BASED_OUTPATIENT_CLINIC_OR_DEPARTMENT_OTHER)
Admission: EM | Admit: 2021-03-29 | Discharge: 2021-03-30 | Disposition: A | Discharge status: Home / Self Care | Payer: Medicare Other | Attending: Cardiology | Admitting: Cardiology

## 2021-03-29 ENCOUNTER — Encounter (HOSPITAL_BASED_OUTPATIENT_CLINIC_OR_DEPARTMENT_OTHER): Payer: Self-pay | Admitting: Urology

## 2021-03-29 ENCOUNTER — Emergency Department (HOSPITAL_BASED_OUTPATIENT_CLINIC_OR_DEPARTMENT_OTHER): Payer: Medicare Other

## 2021-03-29 ENCOUNTER — Ambulatory Visit (HOSPITAL_COMMUNITY): Admit: 2021-03-29 | Payer: Medicare Other | Admitting: Cardiovascular Disease

## 2021-03-29 DIAGNOSIS — I200 Unstable angina: Secondary | ICD-10-CM

## 2021-03-29 DIAGNOSIS — E039 Hypothyroidism, unspecified: Secondary | ICD-10-CM | POA: Diagnosis not present

## 2021-03-29 DIAGNOSIS — I214 Non-ST elevation (NSTEMI) myocardial infarction: Secondary | ICD-10-CM | POA: Diagnosis not present

## 2021-03-29 DIAGNOSIS — M19042 Primary osteoarthritis, left hand: Secondary | ICD-10-CM

## 2021-03-29 DIAGNOSIS — L931 Subacute cutaneous lupus erythematosus: Secondary | ICD-10-CM

## 2021-03-29 DIAGNOSIS — N183 Chronic kidney disease, stage 3 unspecified: Secondary | ICD-10-CM | POA: Diagnosis not present

## 2021-03-29 DIAGNOSIS — I129 Hypertensive chronic kidney disease with stage 1 through stage 4 chronic kidney disease, or unspecified chronic kidney disease: Secondary | ICD-10-CM | POA: Insufficient documentation

## 2021-03-29 DIAGNOSIS — Z9861 Coronary angioplasty status: Secondary | ICD-10-CM

## 2021-03-29 DIAGNOSIS — R918 Other nonspecific abnormal finding of lung field: Secondary | ICD-10-CM | POA: Diagnosis not present

## 2021-03-29 DIAGNOSIS — I2510 Atherosclerotic heart disease of native coronary artery without angina pectoris: Secondary | ICD-10-CM

## 2021-03-29 DIAGNOSIS — Z20822 Contact with and (suspected) exposure to covid-19: Secondary | ICD-10-CM | POA: Diagnosis not present

## 2021-03-29 DIAGNOSIS — Z87891 Personal history of nicotine dependence: Secondary | ICD-10-CM | POA: Diagnosis not present

## 2021-03-29 DIAGNOSIS — R079 Chest pain, unspecified: Secondary | ICD-10-CM | POA: Diagnosis present

## 2021-03-29 DIAGNOSIS — Z79899 Other long term (current) drug therapy: Secondary | ICD-10-CM

## 2021-03-29 DIAGNOSIS — M5136 Other intervertebral disc degeneration, lumbar region: Secondary | ICD-10-CM

## 2021-03-29 DIAGNOSIS — Z7982 Long term (current) use of aspirin: Secondary | ICD-10-CM | POA: Diagnosis not present

## 2021-03-29 DIAGNOSIS — Z955 Presence of coronary angioplasty implant and graft: Secondary | ICD-10-CM | POA: Diagnosis not present

## 2021-03-29 DIAGNOSIS — M1A09X0 Idiopathic chronic gout, multiple sites, without tophus (tophi): Secondary | ICD-10-CM

## 2021-03-29 DIAGNOSIS — I25110 Atherosclerotic heart disease of native coronary artery with unstable angina pectoris: Secondary | ICD-10-CM

## 2021-03-29 DIAGNOSIS — M3500 Sicca syndrome, unspecified: Secondary | ICD-10-CM | POA: Diagnosis present

## 2021-03-29 DIAGNOSIS — M3509 Sicca syndrome with other organ involvement: Secondary | ICD-10-CM

## 2021-03-29 DIAGNOSIS — Z87448 Personal history of other diseases of urinary system: Secondary | ICD-10-CM

## 2021-03-29 HISTORY — PX: LEFT HEART CATH AND CORONARY ANGIOGRAPHY: CATH118249

## 2021-03-29 LAB — HEMOGLOBIN A1C
Hgb A1c MFr Bld: 6.4 % — ABNORMAL HIGH (ref 4.8–5.6)
Mean Plasma Glucose: 136.98 mg/dL

## 2021-03-29 LAB — HEPATIC FUNCTION PANEL
ALT: 26 U/L (ref 0–44)
AST: 38 U/L (ref 15–41)
Albumin: 3.3 g/dL — ABNORMAL LOW (ref 3.5–5.0)
Alkaline Phosphatase: 86 U/L (ref 38–126)
Bilirubin, Direct: 0.2 mg/dL (ref 0.0–0.2)
Indirect Bilirubin: 0.2 mg/dL — ABNORMAL LOW (ref 0.3–0.9)
Total Bilirubin: 0.4 mg/dL (ref 0.3–1.2)
Total Protein: 6.8 g/dL (ref 6.5–8.1)

## 2021-03-29 LAB — CBC
HCT: 54.1 % — ABNORMAL HIGH (ref 39.0–52.0)
Hemoglobin: 17.2 g/dL — ABNORMAL HIGH (ref 13.0–17.0)
MCH: 31 pg (ref 26.0–34.0)
MCHC: 31.8 g/dL (ref 30.0–36.0)
MCV: 97.7 fL (ref 80.0–100.0)
Platelets: 211 10*3/uL (ref 150–400)
RBC: 5.54 MIL/uL (ref 4.22–5.81)
RDW: 14.6 % (ref 11.5–15.5)
WBC: 7.6 10*3/uL (ref 4.0–10.5)
nRBC: 0 % (ref 0.0–0.2)

## 2021-03-29 LAB — BASIC METABOLIC PANEL
Anion gap: 10 (ref 5–15)
BUN: 22 mg/dL (ref 8–23)
CO2: 31 mmol/L (ref 22–32)
Calcium: 9.4 mg/dL (ref 8.9–10.3)
Chloride: 101 mmol/L (ref 98–111)
Creatinine, Ser: 1.45 mg/dL — ABNORMAL HIGH (ref 0.61–1.24)
GFR, Estimated: 50 mL/min — ABNORMAL LOW (ref >60)
Glucose, Bld: 136 mg/dL — ABNORMAL HIGH (ref 70–99)
Potassium: 4.1 mmol/L (ref 3.5–5.1)
Sodium: 142 mmol/L (ref 135–145)

## 2021-03-29 LAB — T4, FREE: Free T4: 0.94 ng/dL (ref 0.61–1.12)

## 2021-03-29 LAB — MAGNESIUM: Magnesium: 2.1 mg/dL (ref 1.7–2.4)

## 2021-03-29 LAB — TROPONIN I (HIGH SENSITIVITY)
Troponin I (High Sensitivity): 9 ng/L (ref <18)
Troponin I (High Sensitivity): 170 ng/L (ref <18)

## 2021-03-29 LAB — RESP PANEL BY RT-PCR (FLU A&B, COVID) ARPGX2
Influenza A by PCR: NEGATIVE
Influenza B by PCR: NEGATIVE
SARS Coronavirus 2 by RT PCR: NEGATIVE

## 2021-03-29 LAB — PROTIME-INR
INR: 1 (ref 0.8–1.2)
Prothrombin Time: 13 seconds (ref 11.4–15.2)

## 2021-03-29 LAB — APTT: aPTT: 31 seconds (ref 24–36)

## 2021-03-29 LAB — TSH: TSH: 3.325 u[IU]/mL (ref 0.350–4.500)

## 2021-03-29 SURGERY — LEFT HEART CATH AND CORONARY ANGIOGRAPHY
Anesthesia: LOCAL

## 2021-03-29 MED ORDER — ONDANSETRON HCL 4 MG/2ML IJ SOLN: 4.0000 mg | Freq: Four times a day (QID) | INTRAMUSCULAR | Status: DC | PRN

## 2021-03-29 MED ORDER — HEPARIN BOLUS VIA INFUSION
4000.0000 [IU] | Freq: Once | INTRAVENOUS | Status: AC
Start: 2021-03-29 — End: 2021-03-29
  Administered 2021-03-29: 4000 [IU] via INTRAVENOUS

## 2021-03-29 MED ORDER — SODIUM CHLORIDE 0.9 % IV SOLN
INTRAVENOUS | Status: AC | PRN
  Administered 2021-03-29: 100 mL/h via INTRAVENOUS

## 2021-03-29 MED ORDER — LIDOCAINE HCL (PF) 1 % IJ SOLN
INTRAMUSCULAR | Status: DC | PRN
  Administered 2021-03-29: 2 mL via SUBCUTANEOUS

## 2021-03-29 MED ORDER — SODIUM CHLORIDE 0.9 % IV SOLN: INTRAVENOUS | Status: AC

## 2021-03-29 MED ORDER — HEPARIN SODIUM (PORCINE) 1000 UNIT/ML IJ SOLN
INTRAMUSCULAR | Status: AC
  Filled 2021-03-29: qty 1

## 2021-03-29 MED ORDER — LEVOTHYROXINE SODIUM 112 MCG PO TABS
112.0000 ug | ORAL_TABLET | Freq: Every evening | ORAL | Status: DC
  Administered 2021-03-29: 112 ug via ORAL
  Filled 2021-03-29: qty 1

## 2021-03-29 MED ORDER — HEPARIN (PORCINE) 25000 UT/250ML-% IV SOLN
1200.0000 [IU]/h | INTRAVENOUS | Status: DC
  Administered 2021-03-29: 1200 [IU]/h via INTRAVENOUS
  Filled 2021-03-29: qty 250

## 2021-03-29 MED ORDER — FENTANYL CITRATE (PF) 100 MCG/2ML IJ SOLN
INTRAMUSCULAR | Status: AC
  Filled 2021-03-29: qty 2

## 2021-03-29 MED ORDER — MIDAZOLAM HCL 2 MG/2ML IJ SOLN
INTRAMUSCULAR | Status: DC | PRN
  Administered 2021-03-29: 1 mg via INTRAVENOUS

## 2021-03-29 MED ORDER — ZOLPIDEM TARTRATE 5 MG PO TABS
5.0000 mg | ORAL_TABLET | Freq: Every evening | ORAL | Status: DC | PRN
  Filled 2021-03-29: qty 1

## 2021-03-29 MED ORDER — VERAPAMIL HCL 2.5 MG/ML IV SOLN
INTRAVENOUS | Status: DC | PRN
  Administered 2021-03-29: 10 mL via INTRA_ARTERIAL

## 2021-03-29 MED ORDER — HYDRALAZINE HCL 20 MG/ML IJ SOLN: 10.0000 mg | INTRAMUSCULAR | Status: AC | PRN

## 2021-03-29 MED ORDER — ASPIRIN 81 MG PO CHEW
324.0000 mg | CHEWABLE_TABLET | Freq: Once | ORAL | Status: AC
  Administered 2021-03-29: 324 mg via ORAL
  Filled 2021-03-29: qty 4

## 2021-03-29 MED ORDER — NITROGLYCERIN 0.4 MG SL SUBL: 0.4000 mg | SUBLINGUAL_TABLET | SUBLINGUAL | Status: DC | PRN

## 2021-03-29 MED ORDER — HEPARIN SODIUM (PORCINE) 1000 UNIT/ML IJ SOLN
INTRAMUSCULAR | Status: DC | PRN
  Administered 2021-03-29: 5000 [IU] via INTRAVENOUS

## 2021-03-29 MED ORDER — MIDAZOLAM HCL 2 MG/2ML IJ SOLN
INTRAMUSCULAR | Status: AC
  Filled 2021-03-29: qty 2

## 2021-03-29 MED ORDER — ACETAMINOPHEN 325 MG PO TABS: 650.0000 mg | ORAL_TABLET | ORAL | Status: DC | PRN

## 2021-03-29 MED ORDER — PROPRANOLOL HCL 60 MG PO TABS
60.0000 mg | ORAL_TABLET | Freq: Two times a day (BID) | ORAL | Status: DC
  Administered 2021-03-29 – 2021-03-30 (×2): 60 mg via ORAL
  Filled 2021-03-29 (×3): qty 1

## 2021-03-29 MED ORDER — SODIUM CHLORIDE 0.9 % IV SOLN: 250.0000 mL | INTRAVENOUS | Status: DC | PRN

## 2021-03-29 MED ORDER — SODIUM CHLORIDE 0.9% FLUSH
3.0000 mL | Freq: Two times a day (BID) | INTRAVENOUS | Status: DC
  Filled 2021-03-29: qty 3

## 2021-03-29 MED ORDER — DULOXETINE HCL 60 MG PO CPEP
60.0000 mg | ORAL_CAPSULE | Freq: Every evening | ORAL | Status: DC
  Administered 2021-03-29: 60 mg via ORAL
  Filled 2021-03-29: qty 1

## 2021-03-29 MED ORDER — VERAPAMIL HCL 2.5 MG/ML IV SOLN
INTRAVENOUS | Status: AC
  Filled 2021-03-29: qty 2

## 2021-03-29 MED ORDER — SODIUM CHLORIDE 0.9% FLUSH: 3.0000 mL | INTRAVENOUS | Status: DC | PRN

## 2021-03-29 MED ORDER — SODIUM CHLORIDE 0.9% FLUSH
3.0000 mL | Freq: Two times a day (BID) | INTRAVENOUS | Status: DC
  Administered 2021-03-29 – 2021-03-30 (×2): 3 mL via INTRAVENOUS

## 2021-03-29 MED ORDER — PANTOPRAZOLE SODIUM 40 MG PO TBEC
40.0000 mg | DELAYED_RELEASE_TABLET | Freq: Every day | ORAL | Status: DC
  Administered 2021-03-29 – 2021-03-30 (×2): 40 mg via ORAL
  Filled 2021-03-29 (×2): qty 1

## 2021-03-29 MED ORDER — ATORVASTATIN CALCIUM 80 MG PO TABS
80.0000 mg | ORAL_TABLET | Freq: Every evening | ORAL | Status: DC
  Administered 2021-03-29: 80 mg via ORAL
  Filled 2021-03-29: qty 1

## 2021-03-29 MED ORDER — FENTANYL CITRATE (PF) 100 MCG/2ML IJ SOLN
INTRAMUSCULAR | Status: DC | PRN
  Administered 2021-03-29: 25 ug via INTRAVENOUS

## 2021-03-29 MED ORDER — IOHEXOL 350 MG/ML SOLN
INTRAVENOUS | Status: DC | PRN
  Administered 2021-03-29: 45 mL via INTRA_ARTERIAL

## 2021-03-29 MED ORDER — HEPARIN (PORCINE) IN NACL 1000-0.9 UT/500ML-% IV SOLN
INTRAVENOUS | Status: AC
  Filled 2021-03-29: qty 1000

## 2021-03-29 MED ORDER — ASPIRIN EC 81 MG PO TBEC
81.0000 mg | DELAYED_RELEASE_TABLET | Freq: Every day | ORAL | Status: DC
  Administered 2021-03-30: 81 mg via ORAL
  Filled 2021-03-29: qty 1

## 2021-03-29 MED ORDER — HEPARIN (PORCINE) IN NACL 1000-0.9 UT/500ML-% IV SOLN
INTRAVENOUS | Status: DC | PRN
  Administered 2021-03-29 (×2): 500 mL

## 2021-03-29 MED ORDER — ALLOPURINOL 300 MG PO TABS
300.0000 mg | ORAL_TABLET | Freq: Every evening | ORAL | Status: DC
  Administered 2021-03-29: 300 mg via ORAL
  Filled 2021-03-29: qty 1

## 2021-03-29 MED ORDER — LIDOCAINE HCL (PF) 1 % IJ SOLN
INTRAMUSCULAR | Status: AC
  Filled 2021-03-29: qty 30

## 2021-03-29 MED ORDER — LABETALOL HCL 5 MG/ML IV SOLN: 10.0000 mg | INTRAVENOUS | Status: AC | PRN

## 2021-03-29 MED ORDER — SODIUM CHLORIDE 0.9 % IV SOLN: INTRAVENOUS | Status: DC

## 2021-03-29 SURGICAL SUPPLY — 9 items

## 2021-03-29 NOTE — ED Notes (Signed)
Back from xray via w/c. Alert, NAD, calm, interactive.

## 2021-03-29 NOTE — Progress Notes (Signed)
ANTICOAGULATION CONSULT NOTE - Initial Consult  Pharmacy Consult for heparin Indication: chest pain/ACS  Allergies  Allergen Reactions   Bee Venom Swelling    Patient Measurements: Height: 6' (182.9 cm) Weight: 109.8 kg (242 lb 1 oz) IBW/kg (Calculated) : 77.6 Heparin Dosing Weight: 100 kg  Vital Signs: Temp: 98.4 F (36.9 C) (11/11 1114) Temp Source: Oral (11/11 1114) BP: 149/87 (11/11 1415) Pulse Rate: 80 (11/11 1415)  Labs: Recent Labs    03/29/21 1120 03/29/21 1330  HGB 17.2*  --   HCT 54.1*  --   PLT 211  --   CREATININE 1.45*  --   TROPONINIHS 9 170*    Estimated Creatinine Clearance: 55.5 mL/min (A) (by C-G formula based on SCr of 1.45 mg/dL (H)).   Medical History: Past Medical History:  Diagnosis Date   CAD S/P percutaneous coronary angioplasty 12/12/2016   a. 11/2016: NSTEMI with DES to LAD and DES to LCx   Gout    Hypertension    Hypothyroidism    Migraine    Sjogren's disease ([place])    Systemic lupus erythematosus ([place])     Medications: Not currently on anticoagulant therapy  Assessment: Patient with new onset chest pain  Goal of Therapy:  Heparin level 0.3-0.7 units/ml Monitor platelets by anticoagulation protocol: Yes   Plan:  Give 4000 units bolus x 1 Start heparin infusion at 1200 units/hr Check anti-Xa level in 8 hours and daily while on heparin Continue to monitor H&H and platelets  Mallie Mussel A Izaih Kataoka 03/29/2021,2:34 PM

## 2021-03-29 NOTE — ED Triage Notes (Signed)
Pt reports intense feeling of GERD, states h/o MI 3 years ago with same symtoms. Denies any SOB

## 2021-03-29 NOTE — H&P (Addendum)
Cardiology Admission History and Physical:   Patient ID: YSIDRO Moon MRN: [id number]; DOB: 1945/07/25   Admission date: 03/29/2021  PCP:  [name], MD   [facility] Providers Cardiologist:  [name], MD        Chief Complaint:  GERD symptoms but feels like his MI  Patient Profile:   Dennis Moon is a 75 y.o. 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 PCI was performed with placement of DES to both the LAD and LCx.  Placed on ASA and Brilinta with BB and statin therapy.  He has done well since though in 2021 he had chest pain while eating and had myoview so underwent nuc study but neg for ischemia.  Last echo 2018 with normal EF and mild LVH.  G1DD.    Today presented to med [facility] with GERD symptoms though not exertional.  Initially pain was 8/10 and with rest down to 1-2/10. And on arrival at [facility] has subsided.  He also has had some DOB and wheezing and his PCP gave inhaler.  No associated symptoms.  No acute EKG changes.  Troponin went from 9 to 170.  Pt will be brought to cath lab for eval, with symptoms and elevated troponin concern for ACS.  He has been placed on IV heparin.   EKG:  The ECG that was done today ST at 109 no acute ST changes  Hs troponin 9 and 170  Na 142 K+ 4,1 Cr 1.45 in April Cr 1.27 INR 1.0 COVID neg    2V CXR IMPRESSION: Mild irregular hazy opacities in the left lower lung zone which may represent atelectasis or infiltrate  BP elevated on arrival 164/114 now 146/92 P 73 R 16    Past Medical History:  Diagnosis Date   CAD S/P percutaneous coronary  angioplasty 12/12/2016   a. 11/2016: NSTEMI with DES to LAD and DES to LCx   Gout    Hypertension    Hypothyroidism    Migraine    Sjogren's disease ([place])    Systemic lupus erythematosus ([place])     Past Surgical History:  Procedure Laterality Date   CATARACT EXTRACTION, BILATERAL Bilateral 2017   COLONOSCOPY  2021   CORONARY STENT INTERVENTION N/A 12/12/2016   Procedure: Coronary Stent Intervention;  Surgeon: Martinique, Peter M, MD;  Location: [facility];  Service: Cardiovascular;  Laterality: N/A;   HERNIA REPAIR     LEFT HEART CATH AND CORONARY ANGIOGRAPHY N/A 12/12/2016   Procedure: Left Heart Cath and Coronary Angiography;  Surgeon: Martinique, Peter M, MD;  Location: [facility];  Service: Cardiovascular;  Laterality: N/A;   THYROID SURGERY  1997     Medications Prior to Admission: Prior to Admission medications   Medication Sig Start Date End Date Taking? Authorizing Provider  albuterol (VENTOLIN HFA) 108 (90 Base) MCG/ACT inhaler Inhale 1 puff into the lungs every 6 (six) hours as needed for wheezing or shortness of breath. 12/24/20   [name], MD  allopurinol (ZYLOPRIM) 300 MG tablet TAKE ONE TABLET BY MOUTH EVERY EVENING 03/26/21   Ofilia Neas, PA-C  Ascorbic Acid (VITAMIN  C) 1000 MG tablet Take 1,000 mg by mouth daily.    [provider]  aspirin-acetaminophen-caffeine (EXCEDRIN EXTRA STRENGTH) [phone] MG tablet Take by mouth every 6 (six) hours as needed for headache.    [provider]  atorvastatin (LIPITOR) 80 MG tablet TAKE ONE TABLET BY MOUTH EVERY EVENING 11/14/20   Martinique, Peter M, MD  Calcium Carb-Cholecalciferol (CALCIUM 600 + D PO) Take 1 tablet by mouth daily.    [provider]  cholecalciferol (VITAMIN D) 25 MCG (1000 UNIT) tablet Take 1,000 Units by mouth daily.    [provider]  Coenzyme Q10 300 MG CAPS Take 300 mg by mouth daily.    [provider]  CRANBERRY EXTRACT PO Take 4,200 mg by  mouth in the morning and at bedtime.     [provider]  DULoxetine (CYMBALTA) 60 MG capsule TAKE ONE CAPSULE BY MOUTH EVERY EVENING 02/20/21   [name], MD  EPINEPHRINE 0.3 mg/0.3 mL IJ SOAJ injection USE AS DIRECTED 11/14/20   [name], MD  esomeprazole (NEXIUM) 20 MG capsule Take 1 capsule (20 mg total) by mouth 2 (two) times daily before a meal. 09/07/20   [name], MD  fluorometholone (FML) 0.1 % ophthalmic suspension Place 1 drop into both eyes as needed. 05/14/20   [provider]  levocetirizine (XYZAL) 5 MG tablet Take 5 mg by mouth daily as needed for allergies.    [provider]  levothyroxine (SYNTHROID) 112 MCG tablet TAKE ONE TABLET BY MOUTH EVERY EVENING 12/11/20   [name], MD  Probiotic Product (PROBIOTIC PO) Take 1 capsule by mouth daily.    [provider]  propranolol (INDERAL) 60 MG tablet TAKE ONE TABLET BY MOUTH EVERY MORNING and TAKE ONE TABLET BY MOUTH EVERY EVENING 10/22/20   [name], MD  testosterone (ANDROGEL) 50 MG/5GM (1%) GEL apply FIVE grams (ONE PACKET) onto [place] 09/26/20   [name], MD  Ubrogepant (UBRELVY) 50 MG TABS Take one at first sign of migraine. May repeat once in 2 hours. 09/07/20   [name], MD  zinc gluconate 50 MG tablet Take 50 mg by mouth daily.    [provider]     Allergies:    Allergies  Allergen Reactions   Bee Venom Swelling    Social History:   Social History   Socioeconomic History   Marital status: Married    Spouse name: Not on file   Number of children: Not on file   Years of education: Not on file   Highest education level: Not on file  Occupational History   Occupation: Dentist  Tobacco Use   Smoking status: Former    Packs/day: 1.00    Years: 6.00    Pack years: 6.00    Types: Cigarettes    Quit date: 04/25/1966    Years since quitting: 54.9   Smokeless tobacco:  Never  Vaping Use   Vaping Use: Never used  Substance and Sexual Activity   Alcohol use: Yes    Alcohol/week: 7.0 standard drinks    Types: 7 Glasses of wine per week   Drug use: Never   Sexual activity: Not on file  Other Topics Concern   Not on file  Social History Narrative   Not on file   Social Determinants of Health   Financial Resource Strain: Low Risk    Difficulty of Paying Living Expenses: Not hard at all  Food  Insecurity: No Food Insecurity   Worried About Charity fundraiser in the Last Year: Never true   Ran Out of Food in the Last Year: Never true  Transportation Needs: No Transportation Needs   Lack of Transportation (Medical): No   Lack of Transportation (Non-Medical): No  Physical Activity: Sufficiently Active   Days of Exercise per Week: 4 days   Minutes of Exercise per Session: 40 min  Stress: No Stress Concern Present   Feeling of Stress : Not at all  Social Connections: Moderately Integrated   Frequency of Communication with Friends and Family: More than three times a week   Frequency of Social Gatherings with Friends and Family: Three times a week   Attends Religious Services: More than 4 times per year   Active Member of Clubs or Organizations: No   Attends Archivist Meetings: Never   Marital Status: Married  Human resources officer Violence: Not At Risk   Fear of Current or Ex-Partner: No   Emotionally Abused: No   Physically Abused: No   Sexually Abused: No    Family History:   The patient's family history includes Diabetes in his mother; Healthy in his son; Hypertension in his father and mother.    ROS:  Please see the history of present illness.  General:no colds or fevers, no weight changes Skin:no rashes or ulcers HEENT:no blurred vision, no congestion CV:see HPI PUL:see HPI GI:no diarrhea constipation or melena, no indigestion GU:no hematuria, no dysuria MS:no joint pain, no claudication Neuro:no syncope, no  lightheadedness Endo:no diabetes, no thyroid disease All other ROS reviewed and negative.     Physical Exam/Data:   Vitals:   03/29/21 1415 03/29/21 1445 03/29/21 1500 03/29/21 1512  BP: (!) 149/87 (!) 161/95 (!) 150/81   Pulse: 80 75 65 73  Resp: 16 (!) 21 16 16   Temp:      TempSrc:      SpO2: 95% 94%  96%  Weight:      Height:       No intake or output data in the 24 hours ending 03/29/21 1541 Last 3 Weights 03/29/2021 11/06/2020 10/05/2020  Weight (lbs) 242 lb 1 oz (No Data) 242 lb  Weight (kg) 109.8 kg (No Data) 109.77 kg     Body mass index is 32.83 kg/m.  General:  Well nourished, well developed, in no acute distress HEENT: normal Neck: no JVD Vascular: No carotid bruits; Distal pulses 2+ bilaterally   Cardiac:  normal S1, S2; RRR; no murmur gallup rub or click Lungs:  clear to auscultation bilaterally, no wheezing, rhonchi or rales  Abd: soft, nontender, no hepatomegaly  Ext: no lower ext edema Musculoskeletal:  No deformities, BUE and BLE strength normal and equal Skin: warm and dry  Neuro:  alert and oriented X 3 MAE follows commandsno focal abnormalities noted Psych:  Normal affect    Relevant CV Studies:   Echocardiogram: 12/12/2016 Study Conclusions   - Left ventricle: The cavity size was normal. Wall thickness was   increased in a pattern of mild LVH. Systolic function was normal.   The estimated ejection fraction was in the range of 60% to 65%.   Wall motion was normal; there were no regional wall motion   abnormalities. Doppler parameters are consistent with abnormal   left ventricular relaxation (grade 1 diastolic dysfunction). The   E/e&' ratio is <8, suggesting normal LV filling pressure. - Mitral valve: Mildly thickened leaflets . There was trivial   regurgitation. - Left  atrium: The atrium was normal in size. - Tricuspid valve: There was trivial regurgitation. - Pulmonic valve: There was mild regurgitation. - Pulmonary arteries: PA peak  pressure: 20 mm Hg (S). - Inferior vena cava: The vessel was normal in size. The   respirophasic diameter changes were in the normal range (>= 50%),   consistent with normal central venous pressure.   Impressions:   - LVEF 60-65%, mild LVH, normal wall motion, grade 1 DD, normal LV   filling pressure, normal LA size, trivial MR, TR, RVSP 20 mmHg,   normal IVC.   Cardiac Catheterization: 12/12/2016 Mid LAD lesion, 40 %stenosed. The left ventricular systolic function is normal. LV end diastolic pressure is normal. The left ventricular ejection fraction is 55-65% by visual estimate. Prox LAD lesion, 90 %stenosed. A STENT PROMUS PREM MR 4.0X20 drug eluting stent was successfully placed. Post intervention, there is a 0% residual stenosis. Ost Cx to Prox Cx lesion, 80 %stenosed. A STENT XIENCE ALPINE RX 3.0X18 drug eluting stent was successfully placed. Post intervention, there is a 0% residual stenosis.   1. 2 vessel obstructive CAD 2. Normal LV function 3. Normal LVEDP 4. Successful stenting of the proximal LAD with DES 5. Successful stenting of the proximal LCx with DES.   Plan: DAPT for one year. Anticipate DC in am.    Myoview 07/29/19: Study Highlights   The left ventricular ejection fraction is mildly decreased (45-54%). Nuclear stress EF: 54%. There was no ST segment deviation noted during stress. The study is normal. This is a low risk study.    Laboratory Data:  High Sensitivity Troponin:   Recent Labs  Lab 03/29/21 1120 03/29/21 1330  TROPONINIHS 9 170*      Chemistry Recent Labs  Lab 03/29/21 1120  NA 142  K 4.1  CL 101  CO2 31  GLUCOSE 136*  BUN 22  CREATININE 1.45*  CALCIUM 9.4  GFRNONAA 50*  ANIONGAP 10    No results for input(s): PROT, ALBUMIN, AST, ALT, ALKPHOS, BILITOT in the last 168 hours. Lipids No results for input(s): CHOL, TRIG, HDL, LABVLDL, LDLCALC, CHOLHDL in the last 168 hours. Hematology Recent Labs  Lab 03/29/21 1120  WBC  7.6  RBC 5.54  HGB 17.2*  HCT 54.1*  MCV 97.7  MCH 31.0  MCHC 31.8  RDW 14.6  PLT 211   Thyroid No results for input(s): TSH, FREET4 in the last 168 hours. BNPNo results for input(s): BNP, PROBNP in the last 168 hours.  DDimer No results for input(s): DDIMER in the last 168 hours.   Radiology/Studies:  DG Chest 2 View  Result Date: 03/29/2021 CLINICAL DATA:  Chest pain EXAM: CHEST - 2 VIEW COMPARISON:  Chest x-ray 12/11/2016 FINDINGS: Heart size is normal. Mediastinum appears stable. Mildly elevated right hemidiaphragm with loop of bowel beneath the diaphragm. Mild irregular hazy opacities in the left lower lung zone. No pleural effusion or pneumothorax. IMPRESSION: Mild irregular hazy opacities in the left lower lung zone which may represent atelectasis or infiltrate. Electronically Signed   By: Ofilia Neas M.D.   On: 03/29/2021 12:10     Assessment and Plan:   Unstable angina/NSTEMI- pt with GERD symptoms but this is how it felt when he had prior NSTEMI and rec'd stents. He is on IV heparin and ASA.  Continue statin, inderal, and home meds for thyroid. Plan for recath today.  COVID neg. 2.  CAD with NSTEMI in 2018 and stents to 90% LAD lesion and also to ost  to [place].    3.  CKD-3  in April was 1.27,  I started NS at 100 cc for now 4.  HTN elevated today, resume home meds. 5. HLD on statin  The patient understands that risks included but are not limited to stroke (1 in 1000), death (1 in 1000), kidney failure [usually temporary] (1 in 500), bleeding (1 in 200), allergic reaction [possibly serious] (1 in 200).    Risk Assessment/Risk Scores:    TIMI Risk Score for Unstable Angina or Non-ST Elevation MI:   The patient's TIMI risk score is 6, which indicates a 41% risk of all cause mortality, new or recurrent myocardial infarction or need for urgent revascularization in the next 14 days.       Severity of Illness: The appropriate patient status for this patient is  INPATIENT. Inpatient status is judged to be reasonable and necessary in order to provide the required intensity of service to ensure the patient's safety. The patient's presenting symptoms, physical exam findings, and initial radiographic and laboratory data in the context of their chronic comorbidities is felt to place them at high risk for further clinical deterioration. Furthermore, it is not anticipated that the patient will be medically stable for discharge from the hospital within 2 midnights of admission.   * I certify that at the point of admission it is my clinical judgment that the patient will require inpatient hospital care spanning beyond 2 midnights from the point of admission due to high intensity of service, high risk for further deterioration and high frequency of surveillance required.*   For questions or updates, please contact [place] Please consult [email or web address] for contact info under     Signed, Cecilie Kicks, NP  03/29/2021 3:41 PM    I have personally seen and examined this patient. I agree with the assessment and plan as outlined above.  75 yo male with history of CAD, CKD stage 3, HTN and HLD who had chest pain today. Pain now resolved. Felt similar to the pain he had prior to his stent placement in 2018.  Troponin mildly elevated.  Plan: Unstable angina: Cardiac cath with possible PCI. Hydration was started in the ED.   Lauree Chandler 03/29/2021 4:46 PM

## 2021-03-29 NOTE — Interval H&P Note (Signed)
History and Physical Interval Note:  03/29/2021 4:53 PM  [name]  has presented today for surgery, with the diagnosis of chest pain.  The various methods of treatment have been discussed with the patient and family. After consideration of risks, benefits and other options for treatment, the patient has consented to  Procedure(s): LEFT HEART CATH AND CORONARY ANGIOGRAPHY (N/A) as a surgical intervention.  The patient's history has been reviewed, patient examined, no change in status, stable for surgery.  I have reviewed the patient's chart and labs.  Questions were answered to the patient's satisfaction.    Cath Lab Visit (complete for each Cath Lab visit)  Clinical Evaluation Leading to the Procedure:   ACS: Yes.    Non-ACS:    Anginal Classification: CCS III  Anti-ischemic medical therapy: Minimal Therapy (1 class of medications)  Non-Invasive Test Results: No non-invasive testing performed  Prior CABG: No previous CABG        Lauree Chandler

## 2021-03-29 NOTE — ED Notes (Signed)
[facility] at facility for transport 

## 2021-03-29 NOTE — ED Provider Notes (Signed)
[facility] EMERGENCY DEPARTMENT Provider Note   CSN: [id number] Arrival date & time: 03/29/21  1105     History Chief Complaint  Patient presents with   Gastroesophageal Reflux    Dennis Moon is a 75 y.o. male with a past medical history significant for hypertension, GERD, coronary artery disease, and NSTEMI around 4 years ago who presents with new onset sternal burning chest pain since 930 this morning.  Patient reports that at initial onset it felt 8/10, he reported that it seemed more or less like his normal acid reflux pain, however did not resolve with acid reflux medication, Alka-Seltzer, and time and so he wanted to be evaluated as this was similar to his presentation of his NSTEMI in 2018.  Patient reports that unlike his previous presentation, this is not exertional in nature.  He also denies nausea, vomiting, diaphoresis, radiation into the left arm, or neck.  Patient does not take blood thinners, he does report compliance with other blood pressure, acid reflux medications.  Patient reports that he is still experiencing some of the sternal chest pain at this time, however it is now 1-2/10 despite no intervention.  Patient does report some subjective shortness of breath, wheezing over the last couple of weeks, did receive an albuterol inhaler from his primary care via telemedicine visit which he has been using intermittently, last used last night.  Patient denies sore throat, fever, chills, cough sputum production.   Gastroesophageal Reflux Associated symptoms include chest pain and shortness of breath.      Past Medical History:  Diagnosis Date   CAD S/P percutaneous coronary angioplasty 12/12/2016   a. 11/2016: NSTEMI with DES to LAD and DES to LCx   Gout    Hypertension    Hypothyroidism    Migraine    Sjogren's disease ([place])    Systemic lupus erythematosus ([place])     Patient Active Problem List   Diagnosis Date Noted   Gastroesophageal reflux disease  09/07/2020   Allergy to honey bee venom 09/07/2020   History of gout 12/29/2019   Elevated cholesterol 12/29/2019   Healthcare maintenance 12/29/2019   Nonintractable headache 06/24/2019   Androgen deficiency 11/17/2017   Hypothyroidism 11/17/2017   Cyst of tonsil 06/11/2017   Obesity (BMI 30.0-34.9) 03/12/2017   CAD S/P percutaneous coronary angioplasty 12/13/2016   NSTEMI (non-ST elevated myocardial infarction) ([place]) 12/11/2016   Gout of multiple sites 08/26/2016   Chronic renal impairment 08/26/2016   Chronic kidney disease 08/26/2016   Sjogren's syndrome ([place]) 04/23/2016   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   Idiopathic chronic gout, unspecified site, without tophus (tophi) 04/23/2016   High risk medication use 04/23/2016   Primary osteoarthritis of both hands 04/23/2016   DDD (degenerative disc disease), lumbar 04/23/2016   Kidney disease 04/23/2016    Past Surgical History:  Procedure Laterality Date   CATARACT EXTRACTION, BILATERAL Bilateral 2017   COLONOSCOPY  2021   CORONARY STENT INTERVENTION N/A 12/12/2016   Procedure: Coronary Stent Intervention;  Surgeon: Martinique, Peter M, MD;  Location: [facility];  Service: Cardiovascular;  Laterality: N/A;   HERNIA REPAIR     LEFT HEART CATH AND CORONARY ANGIOGRAPHY N/A 12/12/2016   Procedure: Left Heart Cath and Coronary Angiography;  Surgeon: Martinique, Peter M, MD;  Location: [facility];  Service: Cardiovascular;  Laterality: N/A;   THYROID SURGERY  1997       Family History  Problem Relation Age of Onset   Diabetes Mother  Hypertension Mother    Hypertension Father    Healthy Son     Social History   Tobacco Use   Smoking status: Former    Packs/day: 1.00    Years: 6.00    Pack years: 6.00    Types: Cigarettes    Quit date: 04/25/1966    Years since quitting: 54.9   Smokeless tobacco: Never  Vaping Use   Vaping Use: Never used  Substance Use Topics   Alcohol use: Yes    Alcohol/week: 7.0 standard  drinks    Types: 7 Glasses of wine per week   Drug use: Never    Home Medications Prior to Admission medications   Medication Sig Start Date End Date Taking? Authorizing Provider  albuterol (VENTOLIN HFA) 108 (90 Base) MCG/ACT inhaler Inhale 1 puff into the lungs every 6 (six) hours as needed for wheezing or shortness of breath. 12/24/20   [name], MD  allopurinol (ZYLOPRIM) 300 MG tablet TAKE ONE TABLET BY MOUTH EVERY EVENING 03/26/21   Ofilia Neas, PA-C  Ascorbic Acid (VITAMIN C) 1000 MG tablet Take 1,000 mg by mouth daily.    [provider]  aspirin-acetaminophen-caffeine (EXCEDRIN EXTRA STRENGTH) [phone] MG tablet Take by mouth every 6 (six) hours as needed for headache.    [provider]  atorvastatin (LIPITOR) 80 MG tablet TAKE ONE TABLET BY MOUTH EVERY EVENING 11/14/20   Martinique, Peter M, MD  Calcium Carb-Cholecalciferol (CALCIUM 600 + D PO) Take 1 tablet by mouth daily.    [provider]  cholecalciferol (VITAMIN D) 25 MCG (1000 UNIT) tablet Take 1,000 Units by mouth daily.    [provider]  Coenzyme Q10 300 MG CAPS Take 300 mg by mouth daily.    [provider]  CRANBERRY EXTRACT PO Take 4,200 mg by mouth in the morning and at bedtime.     [provider]  DULoxetine (CYMBALTA) 60 MG capsule TAKE ONE CAPSULE BY MOUTH EVERY EVENING 02/20/21   [name], MD  EPINEPHRINE 0.3 mg/0.3 mL IJ SOAJ injection USE AS DIRECTED 11/14/20   [name], MD  esomeprazole (NEXIUM) 20 MG capsule Take 1 capsule (20 mg total) by mouth 2 (two) times daily before a meal. 09/07/20   [name], MD  fluorometholone (FML) 0.1 % ophthalmic suspension Place 1 drop into both eyes as needed. 05/14/20   [provider]  levocetirizine (XYZAL) 5 MG tablet Take 5 mg by mouth daily as needed for allergies.    [provider]  levothyroxine (SYNTHROID) 112 MCG tablet TAKE ONE TABLET BY MOUTH  EVERY EVENING 12/11/20   [name], MD  Probiotic Product (PROBIOTIC PO) Take 1 capsule by mouth daily.    [provider]  propranolol (INDERAL) 60 MG tablet TAKE ONE TABLET BY MOUTH EVERY MORNING and TAKE ONE TABLET BY MOUTH EVERY EVENING 10/22/20   [name], MD  testosterone (ANDROGEL) 50 MG/5GM (1%) GEL apply FIVE grams (ONE PACKET) onto [place] 09/26/20   [name], MD  Ubrogepant (UBRELVY) 50 MG TABS Take one at first sign of migraine. May repeat once in 2 hours. 09/07/20   [name], MD  zinc gluconate 50 MG tablet Take 50 mg by mouth daily.    [provider]    Allergies    Bee venom  Review of Systems   Review of Systems  Respiratory:  Positive for shortness of breath.   Cardiovascular:  Positive for chest pain.  All other systems reviewed and are negative.  Physical Exam Updated Vital Signs BP (!) 149/87   Pulse 80   Temp 98.4 F (36.9 C) (Oral)   Resp 16   Ht 6' (1.829 m)   Wt 109.8 kg   SpO2 95%   BMI 32.83 kg/m   Physical Exam Vitals and nursing note reviewed.  Constitutional:      General: He is not in acute distress.    Appearance: Normal appearance.     Comments: Pleasant elderly man sitting on exam table in no acute distress  HENT:     Head: Normocephalic and atraumatic.  Eyes:     General:        Right eye: No discharge.        Left eye: No discharge.  Cardiovascular:     Rate and Rhythm: Regular rhythm. Tachycardia present.     Heart sounds: No murmur heard.   No friction rub. No gallop.  Pulmonary:     Effort: Pulmonary effort is normal.     Breath sounds: Normal breath sounds. No wheezing.  Abdominal:     General: Bowel sounds are normal.     Palpations: Abdomen is soft.     Tenderness: There is no abdominal tenderness.  Skin:    General: Skin is warm and dry.     Capillary Refill: Capillary refill takes less than 2 seconds.  Neurological:     Mental Status: He  is alert and oriented to person, place, and time.  Psychiatric:        Mood and Affect: Mood normal.        Behavior: Behavior normal.    ED Results / Procedures / Treatments   Labs (all labs ordered are listed, but only abnormal results are displayed) Labs Reviewed  BASIC METABOLIC PANEL - Abnormal; Notable for the following components:      Result Value   Glucose, Bld 136 (*)    Creatinine, Ser 1.45 (*)    GFR, Estimated 50 (*)    All other components within normal limits  CBC - Abnormal; Notable for the following components:   Hemoglobin 17.2 (*)    HCT 54.1 (*)    All other components within normal limits  TROPONIN I (HIGH SENSITIVITY) - Abnormal; Notable for the following components:   Troponin I (High Sensitivity) 170 (*)    All other components within normal limits  RESP PANEL BY RT-PCR (FLU A&B, COVID) ARPGX2  HEPARIN LEVEL (UNFRACTIONATED)  APTT  PROTIME-INR  TROPONIN I (HIGH SENSITIVITY)    EKG EKG Interpretation  Date/Time:  Friday March 29 2021 11:12:33 EST Ventricular Rate:  109 PR Interval:  200 QRS Duration: 94 QT Interval:  320 QTC Calculation: 430 R Axis:   67 Text Interpretation: Sinus tachycardia Otherwise normal ECG Confirmed by Madalyn Rob [phone]) on 03/29/2021 1:05:42 PM  Radiology DG Chest 2 View  Result Date: 03/29/2021 CLINICAL DATA:  Chest pain EXAM: CHEST - 2 VIEW COMPARISON:  Chest x-ray 12/11/2016 FINDINGS: Heart size is normal. Mediastinum appears stable. Mildly elevated right hemidiaphragm with loop of bowel beneath the diaphragm. Mild irregular hazy opacities in the left lower lung zone. No pleural effusion or pneumothorax. IMPRESSION: Mild irregular hazy opacities in the left lower lung zone which may represent atelectasis or infiltrate. Electronically Signed   By: Ofilia Neas M.D.   On: 03/29/2021 12:10    Procedures Procedures   Medications Ordered in ED Medications  aspirin chewable tablet  324 mg (has no  administration in time range)  heparin bolus via infusion 4,000 Units (has no administration in time range)  heparin ADULT infusion 100 units/mL (25000 units/231mL) (has no administration in time range)    ED Course  I have reviewed the triage vital signs and the nursing notes.  Pertinent labs & imaging results that were available during my care of the patient were reviewed by me and considered in my medical decision making (see chart for details).    MDM Rules/Calculators/A&P                         I discussed this case with my attending physician who cosigned this note including patient's presenting symptoms, physical exam, and planned diagnostics and interventions. Attending physician stated agreement with plan or made changes to plan which were implemented.   Attending physician assessed patient at bedside.  Overall well-appearing patient who reports that he did not take his Nexium yesterday presenting with sternal chest pain consistent with possible acid reflux.  It was 8 out of 10 at initial presentation but has improved to 0 out of 10 without any intervention while waiting for work-up today.  Patient has negative troponin x1, normal lab work-up otherwise.  Chest x-ray read as left lower lobe atelectasis versus early infiltrate.  As patient has not had cough, fever, and has normal white count today, employed shared decision making, discussed treatment with antibiotics versus waiting at this time. Will wait on antibiotics at this time.  At time of Dr. Roslynn Amble evaluation patient no longer having chest pain.  Second troponin came back at 170. Now concern that patient is in fact having an NSTEMI event. Will administer ASA 324, as well as heparin. Will consult cardiology at this time for admission.  Cardiology agrees to admit to their service at this time. Final Clinical Impression(s) / ED Diagnoses Final diagnoses:  NSTEMI (non-ST elevated myocardial infarction) [facility])    Rx / DC  Orders ED Discharge Orders     None        Anselmo Pickler, PA-C 03/29/21 1445    Lucrezia Starch, MD 03/29/21 [phone]

## 2021-03-30 ENCOUNTER — Inpatient Hospital Stay (HOSPITAL_BASED_OUTPATIENT_CLINIC_OR_DEPARTMENT_OTHER): Payer: Medicare Other

## 2021-03-30 DIAGNOSIS — I214 Non-ST elevation (NSTEMI) myocardial infarction: Secondary | ICD-10-CM

## 2021-03-30 DIAGNOSIS — R079 Chest pain, unspecified: Secondary | ICD-10-CM

## 2021-03-30 LAB — CBC
HCT: 47.8 % (ref 39.0–52.0)
Hemoglobin: 15 g/dL (ref 13.0–17.0)
MCH: 30.7 pg (ref 26.0–34.0)
MCHC: 31.4 g/dL (ref 30.0–36.0)
MCV: 97.8 fL (ref 80.0–100.0)
Platelets: 212 10*3/uL (ref 150–400)
RBC: 4.89 MIL/uL (ref 4.22–5.81)
RDW: 14.6 % (ref 11.5–15.5)
WBC: 8 10*3/uL (ref 4.0–10.5)
nRBC: 0 % (ref 0.0–0.2)

## 2021-03-30 LAB — ECHOCARDIOGRAM COMPLETE
AR max vel: 2.62 cm2
AV Area VTI: 2.62 cm2
AV Area mean vel: 2.44 cm2
AV Mean grad: 4 mmHg
AV Peak grad: 7.5 mmHg
Ao pk vel: 1.37 m/s
Area-P 1/2: 4.57 cm2
Height: 72 in
S' Lateral: 3.4 cm
Weight: 3873.04 oz

## 2021-03-30 LAB — BASIC METABOLIC PANEL
Anion gap: 8 (ref 5–15)
BUN: 16 mg/dL (ref 8–23)
CO2: 28 mmol/L (ref 22–32)
Calcium: 8.9 mg/dL (ref 8.9–10.3)
Chloride: 102 mmol/L (ref 98–111)
Creatinine, Ser: 1.32 mg/dL — ABNORMAL HIGH (ref 0.61–1.24)
GFR, Estimated: 56 mL/min — ABNORMAL LOW (ref >60)
Glucose, Bld: 97 mg/dL (ref 70–99)
Potassium: 4.5 mmol/L (ref 3.5–5.1)
Sodium: 138 mmol/L (ref 135–145)

## 2021-03-30 LAB — LIPID PANEL
Cholesterol: 97 mg/dL (ref 0–200)
HDL: 32 mg/dL — ABNORMAL LOW (ref >40)
LDL Cholesterol: 38 mg/dL (ref 0–99)
Total CHOL/HDL Ratio: 3 RATIO
Triglycerides: 134 mg/dL (ref <150)
VLDL: 27 mg/dL (ref 0–40)

## 2021-03-30 LAB — HEPARIN LEVEL (UNFRACTIONATED): Heparin Unfractionated: <0.1 IU/mL — ABNORMAL LOW (ref 0.30–0.70)

## 2021-03-30 MED ORDER — ASPIRIN 81 MG PO TBEC: 81.0000 mg | DELAYED_RELEASE_TABLET | Freq: Every day | ORAL | 11 refills | Status: DC

## 2021-03-30 NOTE — Care Management Obs Status (Signed)
[facility] NOTIFICATION   Patient Details  Name: GRECO GASTELUM MRN: [id number] Date of Birth: 10-Nov-1945   Medicare Observation Status Notification Given:  Yes    Bethena Roys, RN 03/30/2021, 4:00 PM

## 2021-03-30 NOTE — Progress Notes (Signed)
Paged on call cardiology physician Dr. Stanford Breed to clarify Heparin IV order. He sates that he will defer the decision to the morning team.

## 2021-03-30 NOTE — Progress Notes (Signed)
  Echocardiogram 2D Echocardiogram has been performed.  Merrie Roof F 03/30/2021, 1:06 PM

## 2021-03-30 NOTE — Progress Notes (Addendum)
Progress Note  Patient Name: Dennis Moon Date of Encounter: 03/30/2021  [facility] Cardiologist: [name], MD   Subjective   Denies any CP or SOB overnight.   Inpatient Medications    Scheduled Meds:  allopurinol  300 mg Oral QPM   aspirin EC  81 mg Oral Daily   atorvastatin  80 mg Oral QPM   DULoxetine  60 mg Oral QPM   levothyroxine  112 mcg Oral QPM   pantoprazole  40 mg Oral Daily   propranolol  60 mg Oral BID   sodium chloride flush  3 mL Intravenous Q12H   Continuous Infusions:  sodium chloride     sodium chloride     heparin 1,200 Units/hr (03/29/21 1448)   PRN Meds: sodium chloride, acetaminophen, nitroGLYCERIN, ondansetron (ZOFRAN) IV, sodium chloride flush, zolpidem   Vital Signs    Vitals:   03/29/21 1912 03/29/21 2042 03/29/21 2143 03/30/21 0325  BP: 134/69 126/71 124/70 109/65  Pulse: 76 74 80 69  Resp: 15 18  15   Temp:  99.1 F (37.3 C)  99.1 F (37.3 C)  TempSrc:  Oral  Oral  SpO2: 95% 92%  99%  Weight:      Height:        Intake/Output Summary (Last 24 hours) at 03/30/2021 0744 Last data filed at 03/30/2021 0300 Gross per 24 hour  Intake --  Output 800 ml  Net -800 ml   Last 3 Weights 03/29/2021 11/06/2020 10/05/2020  Weight (lbs) 242 lb 1 oz (No Data) 242 lb  Weight (kg) 109.8 kg (No Data) 109.77 kg      Telemetry    NSR with HR 60-80s, no significant ventricular ectopy - Personally Reviewed  ECG    NSR without significant ST-T wave changes - Personally Reviewed  Physical Exam   GEN: No acute distress.   Neck: No JVD Cardiac: RRR, no murmurs, rubs, or gallops. R radial cath site clean, no bleeding, no oozing. Good distal pulse.  Respiratory: Clear to auscultation bilaterally. GI: Soft, nontender, non-distended  MS: No edema; No deformity. Neuro:  Nonfocal  Psych: Normal affect   Labs    High Sensitivity Troponin:   Recent Labs  Lab 03/29/21 1120 03/29/21 1330  TROPONINIHS 9 170*      Chemistry Recent Labs  Lab 03/29/21 1120 03/29/21 1753 03/30/21 0320  NA 142  --  138  K 4.1  --  4.5  CL 101  --  102  CO2 31  --  28  GLUCOSE 136*  --  97  BUN 22  --  16  CREATININE 1.45*  --  1.32*  CALCIUM 9.4  --  8.9  MG  --  2.1  --   PROT  --  6.8  --   ALBUMIN  --  3.3*  --   AST  --  38  --   ALT  --  26  --   ALKPHOS  --  86  --   BILITOT  --  0.4  --   GFRNONAA 50*  --  56*  ANIONGAP 10  --  8    Lipids  Recent Labs  Lab 03/30/21 0320  CHOL 97  TRIG 134  HDL 32*  LDLCALC 38  CHOLHDL 3.0    Hematology Recent Labs  Lab 03/29/21 1120 03/30/21 0320  WBC 7.6 8.0  RBC 5.54 4.89  HGB 17.2* 15.0  HCT 54.1* 47.8  MCV 97.7 97.8  MCH 31.0 30.7  MCHC 31.8 31.4  RDW 14.6 14.6  PLT 211 212   Thyroid  Recent Labs  Lab 03/29/21 1753  TSH 3.325  FREET4 0.94    BNPNo results for input(s): BNP, PROBNP in the last 168 hours.  DDimer No results for input(s): DDIMER in the last 168 hours.   Radiology    DG Chest 2 View  Result Date: 03/29/2021 CLINICAL DATA:  Chest pain EXAM: CHEST - 2 VIEW COMPARISON:  Chest x-ray 12/11/2016 FINDINGS: Heart size is normal. Mediastinum appears stable. Mildly elevated right hemidiaphragm with loop of bowel beneath the diaphragm. Mild irregular hazy opacities in the left lower lung zone. No pleural effusion or pneumothorax. IMPRESSION: Mild irregular hazy opacities in the left lower lung zone which may represent atelectasis or infiltrate. Electronically Signed   By: Ofilia Neas M.D.   On: 03/29/2021 12:10   CARDIAC CATHETERIZATION  Result Date: 03/29/2021   Prox RCA lesion is 20% stenosed.   Mid LAD lesion is 40% stenosed.   Previously placed Ost Cx to Prox Cx stent (unknown type) is  widely patent.   Previously placed Ost LAD to Prox LAD stent (unknown type) is  widely patent. Stable two vessel CAD Patent proximal LAD stent with mild mid LAD stenosis beyond the stent Patent proximal circumflex stent Mild plaque mid  RCA. Recommendations: Continue medical management of CAD. Echo in the am. Monitor on telemetry tonight.    Cardiac Studies   Cath 03/29/2021   Prox RCA lesion is 20% stenosed.   Mid LAD lesion is 40% stenosed.   Previously placed Ost Cx to Prox Cx stent (unknown type) is  widely patent.   Previously placed Ost LAD to Prox LAD stent (unknown type) is  widely patent.   Stable two vessel CAD Patent proximal LAD stent with mild mid LAD stenosis beyond the stent Patent proximal circumflex stent Mild plaque mid RCA.    Recommendations: Continue medical management of CAD. Echo in the am. Monitor on telemetry tonight.     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   Assessment & Plan    Chest pain with elevated troponin  - hs trop 9 --> 170  - cath 03/29/2021 previously placed LAD and LCx stents widely patent, 20% prox RCA and 40% mid LAD lesion  - TSH and free T4 normal.   - pending echocardiogram to rule out structural heart abnormality.   - consider another troponin this morning to make sure it is trending down. Not sure why it went up in the first place despite having nonobstructive CAD. No arrhythmia was seen overnight. No further chest pain last night. If echo is normal and repeat troponin reassuring, patient can potentially be discharged today  CAD: NSTEMI in 2018 with stents to LAD and pLCx. Low risk myoview 07/29/2019  HTN  HLD  Prediabetes: hemoglobin A1C 6.4  CKD stage III  For questions or updates, please contact [place] Please consult [email or web address] for contact info under        Signed, Almyra Deforest, [place]  03/30/2021, 7:44 AM    Personally seen and examined. Agree with above.  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  His troponin went from 9-170.  Cardiac catheterization took place which showed patent stents 20% proximal RCA 40% mid LAD.  Awaiting echocardiogram.  Overall he is feeling better.  Mildly elevated troponin may have been a demand ischemia episode given his underlying coronary disease/perhaps even microvascular.  Continue with goal-directed medical therapy which includes aspirin 81 mg atorvastatin 80 mg a day.  He also takes propranolol 60 mg twice daily.  If echocardiogram is unremarkable, I think it is reasonable to allow him to be discharged.  Both he and his wife agree.  We will have follow-up.  Candee Furbish, MD

## 2021-03-30 NOTE — Care Management CC44 (Signed)
Condition Code 44 Documentation Completed  Patient Details  Name: Dennis Moon MRN: [id number] Date of Birth: [birth year]   Condition Code 44 given:  Yes Patient signature on Condition Code 44 notice:  Yes Documentation of 2 MD's agreement:  Yes Code 44 added to claim:  Yes    Bethena Roys, RN 03/30/2021, 4:00 PM

## 2021-03-30 NOTE — Discharge Summary (Addendum)
Discharge Summary    Patient ID: Dennis Moon MRN: [id number]; DOB: November 06, 1945  Admit date: 03/29/2021 Discharge date: 03/30/2021  PCP:  [name], MD   [facility] Providers Cardiologist:  [name], MD       Discharge Diagnoses    Principal Problem:   NSTEMI (non-ST elevated myocardial infarction) [facility]) Active Problems:   Sjogren's syndrome [facility])   CAD S/P percutaneous coronary angioplasty   Unstable angina [facility])   Chest pain    Diagnostic Studies/Procedures    Cath 03/29/2021   Prox RCA lesion is 20% stenosed.   Mid LAD lesion is 40% stenosed.   Previously placed Ost Cx to Prox Cx stent (unknown type) is  widely patent.   Previously placed Ost LAD to Prox LAD stent (unknown type) is  widely patent.   Stable two vessel CAD Patent proximal LAD stent with mild mid LAD stenosis beyond the stent Patent proximal circumflex stent Mild plaque mid RCA.    Recommendations: Continue medical management of CAD. Echo in the am. Monitor on telemetry tonight.    Echo 03/30/2021  1. Left ventricular ejection fraction, by estimation, is 60 to 65%. The  left ventricle has normal function. The left ventricle has no regional  wall motion abnormalities. There is mild left ventricular hypertrophy.  Left ventricular diastolic parameters  are consistent with Grade I diastolic dysfunction (impaired relaxation).   2. Right ventricular systolic function is normal. The right ventricular  size is normal.   3. Left atrial size was mildly dilated.   4. The mitral valve is normal in structure. No evidence of mitral valve  regurgitation. No evidence of mitral stenosis.   5. The aortic valve is normal in structure. There is moderate  calcification of the aortic valve. There is mild thickening of the aortic  valve. Aortic valve regurgitation is not visualized. Aortic valve  sclerosis/calcification is present, without any  evidence of aortic stenosis.   6. The inferior  vena cava is normal in size with greater than 50%  respiratory variability, suggesting right atrial pressure of 3 mmHg.  _____________   History of Present Illness     Dennis Moon is a 75 y.o. male with HTN, HLD, CKD-3, gout,  subacute cutaneous lupus erythematosus, Sjogren's, osteoarthritis, and DDD who is being seen 03/29/2021 for the evaluation of chest pain(GERD) and elevated troponin.  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 PCI was performed with placement of DES to both the LAD and LCx.  Placed on ASA and Brilinta with BB and statin therapy.  He has done well since though in 2021 he had chest pain while eating and had myoview so underwent nuc study but neg for ischemia.  Last echo 2018 with normal EF and mild LVH.  G1DD.     Today presented to med [facility] with GERD symptoms though not exertional.  Initially pain was 8/10 and with rest down to 1-2/10. And on arrival at [facility] has subsided.  He also has had some DOB and wheezing and his PCP gave inhaler.  No associated symptoms.  No acute EKG changes.  Troponin went from 9 to 170.  Pt will be brought to cath lab for eval, with symptoms and elevated troponin concern for ACS.  He has been placed on IV heparin.   Hospital Course     Consultants: N/A  Patient underwent cardiac catheterization on 03/29/2021 due to elevated troponin.  Cardiac catheterization revealed patent stent in LAD and the left circumflex, 20% proximal RCA lesion, 40% mid LAD lesion.  Medical therapy was recommended.  Patient was kept overnight for observation.  Telemetry shows no significant arrhythmia to explain the elevated troponin.  TSH and free T4 were normal.  Echocardiogram obtained on 03/30/2021 revealed EF 60-65%, mild LVH, grade 1 diastolic dysfunction, no significant valve issue.   Overall, patient is doing well and  asymptomatic at this point.  He was seen this morning by Dr. Candee Furbish and felt stable for discharge from cardiac perspective.   Did the patient have an acute coronary syndrome (MI, NSTEMI, STEMI, etc) this admission?:  No.   The elevated Troponin was due to the acute medical illness (demand ischemia).         _____________  Discharge Vitals Blood pressure (!) 145/92, pulse 77, temperature 97.9 F (36.6 C), temperature source Oral, resp. rate 18, height 6' (1.829 m), weight 109.8 kg, SpO2 99 %.  Filed Weights   03/29/21 1111  Weight: 109.8 kg    Labs & Radiologic Studies    CBC Recent Labs    03/29/21 1120 03/30/21 0320  WBC 7.6 8.0  HGB 17.2* 15.0  HCT 54.1* 47.8  MCV 97.7 97.8  PLT 211 671   Basic Metabolic Panel Recent Labs    03/29/21 1120 03/29/21 1753 03/30/21 0320  NA 142  --  138  K 4.1  --  4.5  CL 101  --  102  CO2 31  --  28  GLUCOSE 136*  --  97  BUN 22  --  16  CREATININE 1.45*  --  1.32*  CALCIUM 9.4  --  8.9  MG  --  2.1  --    Liver Function Tests Recent Labs    03/29/21 1753  AST 38  ALT 26  ALKPHOS 86  BILITOT 0.4  PROT 6.8  ALBUMIN 3.3*   No results for input(s): LIPASE, AMYLASE in the last 72 hours. High Sensitivity Troponin:   Recent Labs  Lab 03/29/21 1120 03/29/21 1330  TROPONINIHS 9 170*    BNP Invalid input(s): POCBNP D-Dimer No results for input(s): DDIMER in the last 72 hours. Hemoglobin A1C Recent Labs    03/29/21 1753  HGBA1C 6.4*   Fasting Lipid Panel Recent Labs    03/30/21 0320  CHOL 97  HDL 32*  LDLCALC 38  TRIG 134  CHOLHDL 3.0   Thyroid Function Tests Recent Labs    03/29/21 1753  TSH 3.325   _____________  DG Chest 2 View  Result Date: 03/29/2021 CLINICAL DATA:  Chest pain EXAM: CHEST - 2 VIEW COMPARISON:  Chest x-ray 12/11/2016 FINDINGS: Heart size is normal. Mediastinum appears stable. Mildly elevated right hemidiaphragm with loop of bowel beneath the diaphragm. Mild irregular hazy  opacities in the left lower lung zone. No pleural effusion or pneumothorax. IMPRESSION: Mild irregular hazy opacities in the left lower lung zone which may represent atelectasis or infiltrate. Electronically Signed   By: Ofilia Neas M.D.   On: 03/29/2021 12:10   CARDIAC CATHETERIZATION  Result Date: 03/29/2021   Prox RCA lesion is 20% stenosed.   Mid LAD lesion is 40% stenosed.   Previously placed Ost Cx to Prox Cx stent (unknown type) is  widely patent.   Previously placed Ost LAD to Prox LAD stent (unknown type) is  widely patent. Stable two vessel CAD Patent proximal LAD  stent with mild mid LAD stenosis beyond the stent Patent proximal circumflex stent Mild plaque mid RCA. Recommendations: Continue medical management of CAD. Echo in the am. Monitor on telemetry tonight.   ECHOCARDIOGRAM COMPLETE  Result Date: 03/30/2021    ECHOCARDIOGRAM REPORT   Patient Name:   Dennis Moon Date of Exam: 03/30/2021 Medical Rec #:  [id number]         Height:       72.0 in Accession #:    [id number]        Weight:       242.1 lb Date of Birth:  1946-01-15          BSA:          2.311 m Patient Age:    34 years          BP:           145/92 mmHg Patient Gender: M                 HR:           86 bpm. Exam Location:  Inpatient Procedure: 2D Echo, Cardiac Doppler and Color Doppler Indications:    Chest pain  History:        Patient has prior history of Echocardiogram examinations, most                 recent 12/12/2016. CAD; Cath 03/29/21.  Sonographer:    Merrie Roof RDCS Referring Phys: [place]  1. Left ventricular ejection fraction, by estimation, is 60 to 65%. The left ventricle has normal function. The left ventricle has no regional wall motion abnormalities. There is mild left ventricular hypertrophy. Left ventricular diastolic parameters are consistent with Grade I diastolic dysfunction (impaired relaxation).  2. Right ventricular systolic function is normal. The right ventricular size  is normal.  3. Left atrial size was mildly dilated.  4. The mitral valve is normal in structure. No evidence of mitral valve regurgitation. No evidence of mitral stenosis.  5. The aortic valve is normal in structure. There is moderate calcification of the aortic valve. There is mild thickening of the aortic valve. Aortic valve regurgitation is not visualized. Aortic valve sclerosis/calcification is present, without any evidence of aortic stenosis.  6. The inferior vena cava is normal in size with greater than 50% respiratory variability, suggesting right atrial pressure of 3 mmHg. FINDINGS  Left Ventricle: Left ventricular ejection fraction, by estimation, is 60 to 65%. The left ventricle has normal function. The left ventricle has no regional wall motion abnormalities. The left ventricular internal cavity size was normal in size. There is  mild left ventricular hypertrophy. Left ventricular diastolic parameters are consistent with Grade I diastolic dysfunction (impaired relaxation). Right Ventricle: The right ventricular size is normal. No increase in right ventricular wall thickness. Right ventricular systolic function is normal. Left Atrium: Left atrial size was mildly dilated. Right Atrium: Right atrial size was normal in size. Pericardium: There is no evidence of pericardial effusion. Mitral Valve: The mitral valve is normal in structure. No evidence of mitral valve regurgitation. No evidence of mitral valve stenosis. Tricuspid Valve: The tricuspid valve is normal in structure. Tricuspid valve regurgitation is not demonstrated. No evidence of tricuspid stenosis. Aortic Valve: The aortic valve is normal in structure. There is moderate calcification of the aortic valve. There is mild thickening of the aortic valve. Aortic valve regurgitation is not visualized. Aortic valve sclerosis/calcification is present, without any evidence of aortic  stenosis. Aortic valve mean gradient measures 4.0 mmHg. Aortic valve peak  gradient measures 7.5 mmHg. Aortic valve area, by VTI measures 2.62 cm. Pulmonic Valve: The pulmonic valve was normal in structure. Pulmonic valve regurgitation is not visualized. No evidence of pulmonic stenosis. Aorta: The aortic root is normal in size and structure. Venous: The inferior vena cava is normal in size with greater than 50% respiratory variability, suggesting right atrial pressure of 3 mmHg. IAS/Shunts: No atrial level shunt detected by color flow Doppler.  LEFT VENTRICLE PLAX 2D LVIDd:         5.10 cm   Diastology LVIDs:         3.40 cm   LV e' medial:    7.18 cm/s LV PW:         1.30 cm   LV E/e' medial:  6.7 LV IVS:        1.40 cm   LV e' lateral:   10.90 cm/s LVOT diam:     2.20 cm   LV E/e' lateral: 4.4 LV SV:         65 LV SV Index:   28 LVOT Area:     3.80 cm  RIGHT VENTRICLE RV Basal diam:  3.30 cm LEFT ATRIUM             Index        RIGHT ATRIUM           Index LA diam:        4.90 cm 2.12 cm/m   RA Area:     18.70 cm LA Vol (A2C):   96.0 ml 41.55 ml/m  RA Volume:   51.20 ml  22.16 ml/m LA Vol (A4C):   76.4 ml 33.06 ml/m LA Biplane Vol: 87.3 ml 37.78 ml/m  AORTIC VALVE AV Area (Vmax):    2.62 cm AV Area (Vmean):   2.44 cm AV Area (VTI):     2.62 cm AV Vmax:           137.00 cm/s AV Vmean:          93.000 cm/s AV VTI:            0.247 m AV Peak Grad:      7.5 mmHg AV Mean Grad:      4.0 mmHg LVOT Vmax:         94.60 cm/s LVOT Vmean:        59.700 cm/s LVOT VTI:          0.170 m LVOT/AV VTI ratio: 0.69  AORTA Ao Root diam: 3.60 cm Ao Asc diam:  3.40 cm MITRAL VALVE MV Area (PHT): 4.57 cm    SHUNTS MV Decel Time: 166 msec    Systemic VTI:  0.17 m MV E velocity: 48.00 cm/s  Systemic Diam: 2.20 cm MV A velocity: 86.30 cm/s MV E/A ratio:  0.56 Candee Furbish MD Electronically signed by Candee Furbish MD Signature Date/Time: 03/30/2021/2:00:27 PM    Final    Disposition   Pt is being discharged home today in good condition.  Follow-up Plans & Appointments     Follow-up Information      Martinique, Peter M, MD Follow up.   Specialty: Cardiology Why: scheduler will contact you to arrange follow up, please give [place] a call if you do not hear from our scheduler in 3 business days. Contact information: [address] [place] [address] [phone]  Discharge Instructions     Diet - low sodium heart healthy   Complete by: As directed    Increase activity slowly   Complete by: As directed        Discharge Medications   Allergies as of 03/30/2021       Reactions   Bee Venom Swelling   Facial, hand and foot swelling        Medication List     TAKE these medications    albuterol 108 (90 Base) MCG/ACT inhaler Commonly known as: VENTOLIN HFA Inhale 1 puff into the lungs every 6 (six) hours as needed for wheezing or shortness of breath.   allopurinol 300 MG tablet Commonly known as: ZYLOPRIM TAKE ONE TABLET BY MOUTH EVERY EVENING   aspirin 81 MG EC tablet Take 1 tablet (81 mg total) by mouth daily. Swallow whole. Start taking on: March 31, 2021   atorvastatin 80 MG tablet Commonly known as: LIPITOR TAKE ONE TABLET BY MOUTH EVERY EVENING   CALCIUM 600 + D PO Take 1 tablet by mouth every morning.   cholecalciferol 25 MCG (1000 UNIT) tablet Commonly known as: VITAMIN D Take 1,000 Units by mouth every morning.   CRANBERRY EXTRACT PO Take 4,200 mg by mouth 2 (two) times daily.   DULoxetine 60 MG capsule Commonly known as: CYMBALTA TAKE ONE CAPSULE BY MOUTH EVERY EVENING   EPINEPHrine 0.3 mg/0.3 mL Soaj injection Commonly known as: EPI-PEN USE AS DIRECTED What changed:  how much to take how to take this when to take this reasons to take this   esomeprazole 20 MG capsule Commonly known as: NEXIUM Take 1 capsule (20 mg total) by mouth 2 (two) times daily before a meal.   Excedrin Extra Strength 250-250-65 MG tablet Generic drug: aspirin-acetaminophen-caffeine Take 2 tablets by mouth 4 (four) times  daily as needed for headache or migraine (pain).   fluorometholone 0.1 % ophthalmic suspension Commonly known as: FML Place 1 drop into both eyes daily as needed (dry eyes/irritation).   levocetirizine 5 MG tablet Commonly known as: XYZAL Take 5 mg by mouth daily as needed (seasonal allergies).   levothyroxine 112 MCG tablet Commonly known as: SYNTHROID TAKE ONE TABLET BY MOUTH EVERY EVENING   multivitamin with minerals Tabs tablet Take 1 tablet by mouth every morning. One a Day for Men over 50   PROBIOTIC PO Take 1 capsule by mouth every morning.   propranolol 60 MG tablet Commonly known as: INDERAL TAKE ONE TABLET BY MOUTH EVERY MORNING and TAKE ONE TABLET BY MOUTH EVERY EVENING What changed:  how much to take how to take this when to take this additional instructions   Systane 0.4-0.3 % Soln Generic drug: Polyethyl Glycol-Propyl Glycol Place 1 drop into both eyes 2 (two) times daily as needed (dry eyes).   testosterone 50 MG/5GM (1%) Gel Commonly known as: ANDROGEL apply FIVE grams (ONE PACKET) onto THE SKIN TWICE DAILY What changed:  how much to take how to take this when to take this additional instructions   Ubrelvy 50 MG Tabs Generic drug: Ubrogepant Take one at first sign of migraine. May repeat once in 2 hours. What changed:  how much to take how to take this when to take this additional instructions   vitamin C 1000 MG tablet Take 1,000 mg by mouth every morning.   zinc gluconate 50 MG tablet Take 50 mg by mouth every morning.           Outstanding Labs/Studies   None  Duration of Discharge Encounter   Greater than 30 minutes including physician time.  Hilbert Corrigan, PA 03/30/2021, 2:04 PM  Personally seen and examined. Agree with above.   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  His troponin went from 9-170.  Cardiac catheterization took place which showed patent stents 20% proximal RCA 40% mid LAD.  Awaiting echocardiogram.  Overall he is feeling better.  Mildly elevated troponin may have been a demand ischemia episode given his underlying coronary disease/perhaps even microvascular.  Continue with goal-directed medical therapy which includes aspirin 81 mg atorvastatin 80 mg a day.  He also takes propranolol 60 mg twice daily.   Echocardiogram performed today is unremarkable.  Okay for discharge.  Both he and wife amenable with plan.  Candee Furbish, MD

## 2021-04-01 ENCOUNTER — Encounter (HOSPITAL_COMMUNITY): Payer: Self-pay | Admitting: Cardiovascular Disease

## 2021-04-03 ENCOUNTER — Telehealth: Payer: Self-pay | Admitting: Cardiology

## 2021-04-03 NOTE — Telephone Encounter (Signed)
Routed to [employer] to use 7 day hold for [place] NP on 12/5 Or TOC spots with Urban Gibson NP at [facility] on 12/6

## 2021-04-03 NOTE — Telephone Encounter (Signed)
Patient's wife is calling to schedule hospital follow up with Dr. Martinique or APP in 2-3 weeks. No availability until January. Unable to contact LPN to inquire about Chales Abrahams 12/05 "7 day hold" slot. Okay to schedule patient for then? Otherwise, is there anywhere else the patient can be worked in? Please advise.

## 2021-04-05 ENCOUNTER — Ambulatory Visit: Payer: Medicare Other | Admitting: Physician Assistant

## 2021-04-18 ENCOUNTER — Telehealth: Payer: Self-pay

## 2021-04-18 NOTE — Progress Notes (Signed)
Chronic Care Management Pharmacy Assistant   Name: Dennis Moon  MRN: [id number] DOB: August 10, 1945  Reason for Encounter: Medication Review/Medication Coordination Call.   Recent office visits:  No recent office Visit  Recent consult visits:  No recent consult visit  Hospital visits:  Medication Reconciliation was completed by comparing discharge summary, patient's EMR and Pharmacy list, and upon discussion with patient.  Admitted to the hospital on 03/29/2021 due to NSTEMI. Discharge date was 03/30/2021. Discharged from [facility]?Medications Started at [facility] Discharge:?? -started none   Medication Changes at Hospital Discharge: -Changed Aspirin 81mg   Medications Discontinued at Hospital Discharge: -Stopped None  Medications that remain the same after Hospital Discharge:??  -All other medications will remain the same.    Medications: Outpatient Encounter Medications as of 04/18/2021  Medication Sig Note   albuterol (VENTOLIN HFA) 108 (90 Base) MCG/ACT inhaler Inhale 1 puff into the lungs every 6 (six) hours as needed for wheezing or shortness of breath.    allopurinol (ZYLOPRIM) 300 MG tablet TAKE ONE TABLET BY MOUTH EVERY EVENING (Patient taking differently: Take 300 mg by mouth every evening.)    Ascorbic Acid (VITAMIN C) 1000 MG tablet Take 1,000 mg by mouth every morning.    aspirin EC 81 MG EC tablet Take 1 tablet (81 mg total) by mouth daily. Swallow whole.    aspirin-acetaminophen-caffeine (EXCEDRIN EXTRA STRENGTH) 250-250-65 MG tablet Take 2 tablets by mouth 4 (four) times daily as needed for headache or migraine (pain).    atorvastatin (LIPITOR) 80 MG tablet TAKE ONE TABLET BY MOUTH EVERY EVENING (Patient taking differently: Take 80 mg by mouth every evening.)    Calcium Carb-Cholecalciferol (CALCIUM 600 + D PO) Take 1 tablet by mouth every morning.    cholecalciferol (VITAMIN D) 25 MCG (1000 UNIT) tablet Take 1,000 Units by mouth  every morning.    CRANBERRY EXTRACT PO Take 4,200 mg by mouth 2 (two) times daily.    DULoxetine (CYMBALTA) 60 MG capsule TAKE ONE CAPSULE BY MOUTH EVERY EVENING (Patient taking differently: Take 60 mg by mouth every evening.)    EPINEPHRINE 0.3 mg/0.3 mL IJ SOAJ injection USE AS DIRECTED (Patient taking differently: Inject 0.3 mg into the muscle as needed for anaphylaxis (severe allergic reaction).)    esomeprazole (NEXIUM) 20 MG capsule Take 1 capsule (20 mg total) by mouth 2 (two) times daily before a meal. 03/29/2021: OTC   fluorometholone (FML) 0.1 % ophthalmic suspension Place 1 drop into both eyes daily as needed (dry eyes/irritation).    levocetirizine (XYZAL) 5 MG tablet Take 5 mg by mouth daily as needed (seasonal allergies).    levothyroxine (SYNTHROID) 112 MCG tablet TAKE ONE TABLET BY MOUTH EVERY EVENING (Patient taking differently: Take 112 mcg by mouth every evening.)    Multiple Vitamin (MULTIVITAMIN WITH MINERALS) TABS tablet Take 1 tablet by mouth every morning. One a Day for Men over 50    Polyethyl Glycol-Propyl Glycol (SYSTANE) 0.4-0.3 % SOLN Place 1 drop into both eyes 2 (two) times daily as needed (dry eyes).    Probiotic Product (PROBIOTIC PO) Take 1 capsule by mouth every morning.    propranolol (INDERAL) 60 MG tablet TAKE ONE TABLET BY MOUTH EVERY MORNING and TAKE ONE TABLET BY MOUTH EVERY EVENING (Patient taking differently: Take 60 mg by mouth 2 (two) times daily.)    testosterone (ANDROGEL) 50 MG/5GM (1%) GEL apply FIVE grams (ONE PACKET) onto THE SKIN TWICE DAILY (Patient taking differently: Place 5  g onto the skin See admin instructions. Apply 5 g topically to skin up to two times daily)    Ubrogepant (UBRELVY) 50 MG TABS Take one at first sign of migraine. May repeat once in 2 hours. (Patient taking differently: Take 50 mg by mouth See admin instructions. Take one tablet (50 mg) by mouth at first sign of migraine; may repeat once in 2 hours if still needed)    zinc  gluconate 50 MG tablet Take 50 mg by mouth every morning.    No facility-administered encounter medications on file as of 04/18/2021.    Care Gaps: Shingrix Vaccine Influenza Vaccine Star Rating Drugs: Atorvastatin 80 mg last refilled 03/26/2021 for a 30-Day supply at [facility] Medication Fill Gaps: None  Reviewed chart for medication changes ahead of medication coordination call.  BP Readings from Last 3 Encounters:  03/30/21 (!) 145/92  10/05/20 125/81  09/28/20 (!) 142/90    Lab Results  Component Value Date   HGBA1C 6.4 (H) 03/29/2021     Patient obtains medications through Adherence Packaging  30 Days   Last adherence delivery included:  Allopurinol 300 mg One  tablet daily - Evening meals  Duloxetine 60 mg One Capsule daily - Evening Meals  Levothyroxine 112 mcg  One Tablet Daily- Evening Meals    Propranolol 60 mg Tablet  Take 1 tablet by mouth twice Daily- Breakfast   & Evening Meals   Atorvastatin 80 mg  One Tablet daily - Evening Meals  Fluorometholone 0.1% eye drops-  Place 1 drop into both eyes as needed  Patient declined medications last month  Albuterol 108  MCG/ACT inhaler  1 puff into the lungs every 6 (six) hours as needed for wheezing or shortness of breath EPINEPHRINE 0.3 mg/0.3 mL IJ SOAJ injection PRN (Patient states he will inform me when he needs a refill)  Testosterone 50mg /5g (1%) gel 1 packet apply five grams (ONE PACKET) onto the skin twice daily  Vitamin C 1000 mg daily  -OTC Excedrin Extra Strength  Every 6 hours PRN - Vial (OTC) Calcium carbonate 600 mg-OTC Vitamin D 1000 units  - OTC CoQ10 300 mg  - OTC Cranberry Extract 4200 mg - OTC Esomeprazole 20 mg BID  - OTC  Xyzal 5 mg daily PRN - Vial -OTC Magnesium Oxide 400 mg  - OTC Centrum Silver Mens  - OTC Grapeseed Extract  100 mg - OTC Probiotic Daily- OTC Turmeric (unknown dose) - OTC  Zinc Gluconate 50 mg  - OTC  Patient is due for next adherence delivery on:  04/29/2021. Called patient and reviewed medications and coordinated delivery.  Unable to reach patient to completed Medication Coordination form.  [facility] will contact patient to confirm delivery.Junius Argyle, CPP was notified I was unable to reach patient.I will attempt to reach patient again in one month.   [place] Pharmacist Assistant [phone]

## 2021-04-19 DIAGNOSIS — I129 Hypertensive chronic kidney disease with stage 1 through stage 4 chronic kidney disease, or unspecified chronic kidney disease: Secondary | ICD-10-CM | POA: Diagnosis not present

## 2021-04-19 DIAGNOSIS — N183 Chronic kidney disease, stage 3 unspecified: Secondary | ICD-10-CM | POA: Diagnosis not present

## 2021-04-26 ENCOUNTER — Other Ambulatory Visit: Payer: Self-pay

## 2021-04-26 ENCOUNTER — Ambulatory Visit (INDEPENDENT_AMBULATORY_CARE_PROVIDER_SITE_OTHER): Payer: Medicare Other | Admitting: Family Medicine

## 2021-04-26 ENCOUNTER — Encounter: Payer: Self-pay | Admitting: Family Medicine

## 2021-04-26 VITALS — BP 132/78 | HR 79 | Temp 97.1°F | Ht 72.0 in | Wt 238.2 lb

## 2021-04-26 DIAGNOSIS — R7303 Prediabetes: Secondary | ICD-10-CM

## 2021-04-26 DIAGNOSIS — Z09 Encounter for follow-up examination after completed treatment for conditions other than malignant neoplasm: Secondary | ICD-10-CM

## 2021-04-26 MED ORDER — METFORMIN HCL ER 500 MG PO TB24: 500.0000 mg | ORAL_TABLET | Freq: Every evening | ORAL | 1 refills | Status: DC

## 2021-04-26 NOTE — Progress Notes (Signed)
I reach out to [facility] to request a refill for fluorometholone 0.1% eye drops to go to [facility] on 04/26/2021.  [place] Pharmacist Assistant [phone]

## 2021-04-26 NOTE — Progress Notes (Signed)
Established Patient Office Visit  Subjective:  Patient ID: Dennis Moon, male    DOB: 1945/10/02  Age: 75 y.o. MRN: [id number]  CC:  Chief Complaint  Patient presents with   Hospitalization [facility] follow up, no concerns.     HPI Dennis Moon presents for hospital discharge this past month follow-up status post recent hospitalization for non-ST segment MI with elevated cardiac markers.  Emergent catheterization at that time showed no changes from same study conducted in 2018.  Echo showed continued normal left ventricular function without left ventricular hypokinesis.  Hemoglobin A1c drawn was mildly elevated at 6.4.  Has no history of diabetes.  Remaining labs were normal and/or stable.  He has been doing well.  Denies any further chest pain shortness of breath diaphoresis nausea.  He has not needed nitroglycerin.  Denies recent weight loss  Past Medical History:  Diagnosis Date   CAD S/P percutaneous coronary angioplasty 12/12/2016   a. 11/2016: NSTEMI with DES to LAD and DES to LCx   Gout    Hypertension    Hypothyroidism    Migraine    Sjogren's disease ([place])    Systemic lupus erythematosus ([place])     Past Surgical History:  Procedure Laterality Date   CATARACT EXTRACTION, BILATERAL Bilateral 2017   COLONOSCOPY  2021   CORONARY STENT INTERVENTION N/A 12/12/2016   Procedure: Coronary Stent Intervention;  Surgeon: Martinique, Peter M, MD;  Location: [facility];  Service: Cardiovascular;  Laterality: N/A;   HERNIA REPAIR     LEFT HEART CATH AND CORONARY ANGIOGRAPHY N/A 12/12/2016   Procedure: Left Heart Cath and Coronary Angiography;  Surgeon: Martinique, Peter M, MD;  Location: [facility] [facility];  Service: Cardiovascular;  Laterality: N/A;   LEFT HEART CATH AND CORONARY ANGIOGRAPHY N/A 03/29/2021   Procedure: LEFT HEART CATH AND CORONARY ANGIOGRAPHY;  Surgeon: Burnell Blanks, MD;  Location: [facility];  Service: Cardiovascular;   Laterality: N/A;   THYROID SURGERY  1997    Family History  Problem Relation Age of Onset   Diabetes Mother    Hypertension Mother    Hypertension Father    Healthy Son     Social History   Socioeconomic History   Marital status: Married    Spouse name: Not on file   Number of children: Not on file   Years of education: Not on file   Highest education level: Not on file  Occupational History   Occupation: Dentist  Tobacco Use   Smoking status: Former    Packs/day: 1.00    Years: 6.00    Pack years: 6.00    Types: Cigarettes    Quit date: 04/25/1966    Years since quitting: 55.0   Smokeless tobacco: Never  Vaping Use   Vaping Use: Never used  Substance and Sexual Activity   Alcohol use: Yes    Alcohol/week: 7.0 standard drinks    Types: 7 Glasses of wine per week   Drug use: Never   Sexual activity: Not on file  Other Topics Concern   Not on file  Social History Narrative   Not on file   Social Determinants of Health   Financial Resource Strain: Low Risk    Difficulty of Paying Living Expenses: Not hard at all  Food Insecurity: No Food Insecurity   Worried About Charity fundraiser in the Last Year: Never true   [place] in the Last Year:  Never true  Transportation Needs: No Transportation Needs   Lack of Transportation (Medical): No   Lack of Transportation (Non-Medical): No  Physical Activity: Sufficiently Active   Days of Exercise per Week: 4 days   Minutes of Exercise per Session: 40 min  Stress: No Stress Concern Present   Feeling of Stress : Not at all  Social Connections: Moderately Integrated   Frequency of Communication with Friends and Family: More than three times a week   Frequency of Social Gatherings with Friends and Family: Three times a week   Attends Religious Services: More than 4 times per year   Active Member of Clubs or Organizations: No   Attends Archivist Meetings: Never   Marital Status: Married  Arboriculturist Violence: Not At Risk   Fear of Current or Ex-Partner: No   Emotionally Abused: No   Physically Abused: No   Sexually Abused: No    Outpatient Medications Prior to Visit  Medication Sig Dispense Refill   albuterol (VENTOLIN HFA) 108 (90 Base) MCG/ACT inhaler Inhale 1 puff into the lungs every 6 (six) hours as needed for wheezing or shortness of breath. 8 g 2   allopurinol (ZYLOPRIM) 300 MG tablet TAKE ONE TABLET BY MOUTH EVERY EVENING (Patient taking differently: Take 300 mg by mouth every evening.) 90 tablet 0   Ascorbic Acid (VITAMIN C) 1000 MG tablet Take 1,000 mg by mouth every morning.     aspirin-acetaminophen-caffeine (EXCEDRIN EXTRA STRENGTH) 250-250-65 MG tablet Take 2 tablets by mouth 4 (four) times daily as needed for headache or migraine (pain).     atorvastatin (LIPITOR) 80 MG tablet TAKE ONE TABLET BY MOUTH EVERY EVENING (Patient taking differently: Take 80 mg by mouth every evening.) 90 tablet 3   Calcium Carb-Cholecalciferol (CALCIUM 600 + D PO) Take 1 tablet by mouth every morning.     cholecalciferol (VITAMIN D) 25 MCG (1000 UNIT) tablet Take 1,000 Units by mouth every morning.     CRANBERRY EXTRACT PO Take 4,200 mg by mouth 2 (two) times daily.     DULoxetine (CYMBALTA) 60 MG capsule TAKE ONE CAPSULE BY MOUTH EVERY EVENING (Patient taking differently: Take 60 mg by mouth every evening.) 90 capsule 0   EPINEPHRINE 0.3 mg/0.3 mL IJ SOAJ injection USE AS DIRECTED (Patient taking differently: Inject 0.3 mg into the muscle as needed for anaphylaxis (severe allergic reaction).) 2 each 2   esomeprazole (NEXIUM) 20 MG capsule Take 1 capsule (20 mg total) by mouth 2 (two) times daily before a meal. 90 capsule 3   fluorometholone (FML) 0.1 % ophthalmic suspension Place 1 drop into both eyes daily as needed (dry eyes/irritation).     levocetirizine (XYZAL) 5 MG tablet Take 5 mg by mouth daily as needed (seasonal allergies).     levothyroxine (SYNTHROID) 112 MCG tablet TAKE ONE  TABLET BY MOUTH EVERY EVENING (Patient taking differently: Take 112 mcg by mouth every evening.) 90 tablet 2   Multiple Vitamin (MULTIVITAMIN WITH MINERALS) TABS tablet Take 1 tablet by mouth every morning. One a Day for Men over 50     Polyethyl Glycol-Propyl Glycol (SYSTANE) 0.4-0.3 % SOLN Place 1 drop into both eyes 2 (two) times daily as needed (dry eyes).     Probiotic Product (PROBIOTIC PO) Take 1 capsule by mouth every morning.     propranolol (INDERAL) 60 MG tablet TAKE ONE TABLET BY MOUTH EVERY MORNING and TAKE ONE TABLET BY MOUTH EVERY EVENING (Patient taking differently: Take 60 mg by mouth  2 (two) times daily.) 180 tablet 4   testosterone (ANDROGEL) 50 MG/5GM (1%) GEL apply FIVE grams (ONE PACKET) onto THE SKIN TWICE DAILY (Patient taking differently: Place 5 g onto the skin See admin instructions. Apply 5 g topically to skin up to two times daily) 300 g 4   Ubrogepant (UBRELVY) 50 MG TABS Take one at first sign of migraine. May repeat once in 2 hours. (Patient taking differently: Take 50 mg by mouth See admin instructions. Take one tablet (50 mg) by mouth at first sign of migraine; may repeat once in 2 hours if still needed) 10 tablet 2   aspirin EC 81 MG EC tablet Take 1 tablet (81 mg total) by mouth daily. Swallow whole. 30 tablet 11   zinc gluconate 50 MG tablet Take 50 mg by mouth every morning.     No facility-administered medications prior to visit.    Allergies  Allergen Reactions   Bee Venom Swelling    Facial, hand and foot swelling    ROS Review of Systems  Constitutional:  Negative for chills, diaphoresis, fatigue, fever and unexpected weight change.  HENT: Negative.    Eyes:  Negative for photophobia and visual disturbance.  Respiratory:  Negative for chest tightness, shortness of breath and wheezing.   Cardiovascular:  Negative for chest pain and palpitations.  Gastrointestinal:  Negative for abdominal pain, nausea and vomiting.  Endocrine: Negative for polyphagia  and polyuria.  Genitourinary: Negative.   Musculoskeletal:  Negative for gait problem and joint swelling.  Neurological:  Negative for speech difficulty and weakness.  Psychiatric/Behavioral: Negative.       Objective:    Physical Exam Vitals and nursing note reviewed.  Constitutional:      General: He is not in acute distress.    Appearance: Normal appearance. He is not ill-appearing, toxic-appearing or diaphoretic.  HENT:     Head: Normocephalic and atraumatic.     Right Ear: External ear normal.     Left Ear: External ear normal.     Mouth/Throat:     Mouth: Mucous membranes are moist.     Pharynx: Oropharynx is clear. No oropharyngeal exudate or posterior oropharyngeal erythema.  Eyes:     General: No scleral icterus.       Right eye: No discharge.        Left eye: No discharge.     Extraocular Movements: Extraocular movements intact.     Conjunctiva/sclera: Conjunctivae normal.     Pupils: Pupils are equal, round, and reactive to light.  Neck:     Vascular: No carotid bruit.  Cardiovascular:     Rate and Rhythm: Normal rate and regular rhythm.  Pulmonary:     Effort: Pulmonary effort is normal.     Breath sounds: Normal breath sounds.  Abdominal:     General: Bowel sounds are normal.  Musculoskeletal:     Cervical back: No rigidity or tenderness.  Lymphadenopathy:     Cervical: No cervical adenopathy.  Skin:    General: Skin is warm and dry.  Neurological:     Mental Status: He is alert.  Psychiatric:        Mood and Affect: Mood normal.        Behavior: Behavior normal.    BP 132/78 (BP Location: Left Arm, Patient Position: Sitting, Cuff Size: Large)   Pulse 79   Temp (!) 97.1 F (36.2 C) (Temporal)   Ht 6' (1.829 m)   Wt 238 lb 3.2 oz (108 kg)  SpO2 94%   BMI 32.31 kg/m  Wt Readings from Last 3 Encounters:  04/26/21 238 lb 3.2 oz (108 kg)  03/29/21 242 lb 1 oz (109.8 kg)  10/05/20 242 lb (109.8 kg)     Health Maintenance Due  Topic Date Due    Zoster Vaccines- Shingrix (2 of 2) 01/07/2018    There are no preventive care reminders to display for this patient.  Lab Results  Component Value Date   TSH 3.325 03/29/2021   Lab Results  Component Value Date   WBC 8.0 03/30/2021   HGB 15.0 03/30/2021   HCT 47.8 03/30/2021   MCV 97.8 03/30/2021   PLT 212 03/30/2021   Lab Results  Component Value Date   NA 138 03/30/2021   K 4.5 03/30/2021   CO2 28 03/30/2021   GLUCOSE 97 03/30/2021   BUN 16 03/30/2021   CREATININE 1.32 (H) 03/30/2021   BILITOT 0.4 03/29/2021   ALKPHOS 86 03/29/2021   AST 38 03/29/2021   ALT 26 03/29/2021   PROT 6.8 03/29/2021   ALBUMIN 3.3 (L) 03/29/2021   CALCIUM 8.9 03/30/2021   ANIONGAP 8 03/30/2021   GFR 55.51 (L) 09/07/2020   Lab Results  Component Value Date   CHOL 97 03/30/2021   Lab Results  Component Value Date   HDL 32 (L) 03/30/2021   Lab Results  Component Value Date   LDLCALC 38 03/30/2021   Lab Results  Component Value Date   TRIG 134 03/30/2021   Lab Results  Component Value Date   CHOLHDL 3.0 03/30/2021   Lab Results  Component Value Date   HGBA1C 6.4 (H) 03/29/2021      Assessment & Plan:   Problem List Items Addressed This Visit       Other   Hospital discharge follow-up   Prediabetes - Primary   Relevant Medications   metFORMIN (GLUCOPHAGE XR) 500 MG 24 hr tablet    Meds ordered this encounter  Medications   metFORMIN (GLUCOPHAGE XR) 500 MG 24 hr tablet    Sig: Take 1 tablet (500 mg total) by mouth at bedtime.    Dispense:  90 tablet    Refill:  1     Follow-up: Return in about 3 months (around 07/25/2021).   Prediabetes treated with Metformin XL nightly.  [name], MD

## 2021-04-29 ENCOUNTER — Encounter (HOSPITAL_BASED_OUTPATIENT_CLINIC_OR_DEPARTMENT_OTHER): Payer: Self-pay | Admitting: Family

## 2021-04-29 NOTE — Progress Notes (Signed)
Office Visit    Patient Name: Dennis Moon Date of Encounter: 04/30/2021  Primary Care Provider:  [name], MD Primary Cardiologist:  [name], MD  Chief Complaint    75 year old male with a history of CAD s/p NSTEMI (2018/2022), DES x2 to LAD/LCx in 2018, hypertension, hyperlipidemia, CKD stage 3a, gout, SLE, Sjogren's syndrome, GERD, OA, and DDD who presents for follow-up related to CAD and recent NSTEMI.   Past Medical History    Past Medical History:  Diagnosis Date   CAD S/P percutaneous coronary angioplasty 12/12/2016   a. 11/2016: NSTEMI with DES to LAD and DES to LCx; b. MV low risk 2021; c. NSTEMI 11/22, LHC patent stents to LAD/LCx, 40% mLAD, 20% mRCA, stable, med mgmt. Echo 11/22 EF 60-65%, GIDD, mild LAE.   CKD (chronic kidney disease), stage III (HCC)    GERD (gastroesophageal reflux disease)    Gout    Hyperlipidemia LDL goal <70    Hypertension    Hypothyroidism    Migraine    Osteoarthritis    Sjogren's disease ([place])    Systemic lupus erythematosus ([place])    Past Surgical History:  Procedure Laterality Date   CATARACT EXTRACTION, BILATERAL Bilateral 2017   COLONOSCOPY  2021   CORONARY STENT INTERVENTION N/A 12/12/2016   Procedure: Coronary Stent Intervention;  Surgeon: Martinique, Peter M, MD;  Location: [facility];  Service: Cardiovascular;  Laterality: N/A;   HERNIA REPAIR     LEFT HEART CATH AND CORONARY ANGIOGRAPHY N/A 12/12/2016   Procedure: Left Heart Cath and Coronary Angiography;  Surgeon: Martinique, Peter M, MD;  Location: [facility];  Service: Cardiovascular;  Laterality: N/A;   LEFT HEART CATH AND CORONARY ANGIOGRAPHY N/A 03/29/2021   Procedure: LEFT HEART CATH AND CORONARY ANGIOGRAPHY;  Surgeon: Burnell Blanks, MD;  Location: [facility];  Service: Cardiovascular;  Laterality: N/A;   THYROID SURGERY  1997    Allergies  Allergies  Allergen Reactions   Bee Venom Swelling    Facial, hand and  foot swelling    History of Present Illness    75 year old male with the above past medical history including CAD s/p NSTEMI (2018 and 2022), DES x2 to LAD/LCx in 2018, hypertension, hyperlipidemia, CKD stage 3a, gout, SLE, Sjogren's syndrome, GERD, OA, and DDD who presents for follow-up related to CAD and recent NSTEMI.   He had done well from a cardiac standpoint until 2021 when he began experiencing chest pain while eating. MV in 2021 was low risk. He presented to [facility] on 03/29/2021 with GERD symptoms/chest pain, ruled in for NSTEMI (HsTroponin 9>170). Cath showed stable 2-vessel CAD, with patent stents to pLAD/pLCx, with 40% mLAD and 20% mRCA stenosis. Medical management advised. Echo showed EF 60-65%, GIDD, mild LAE. He was discharged the following day in stable condition.   He presents today for follow-up. Since his hospitalization done well from a cardiac standpoint. Denies symptoms of angina. He is not interested in cardiac rehab referral at this time, however, he is hoping to increase his activity as tolerated. He does have a history of chronic back pain and is most comfortable exercising on his home recumbent bike.  Blood pressure somewhat elevated today.  He does report readings consistently greater than 130/80 at home.  He is not on any current antihypertensive regimen.  He takes propanolol twice daily for persistent migraines.  Other than his elevated blood pressure, he does not have any concerns  today. Home Medications    Current Outpatient Medications  Medication Sig Dispense Refill   albuterol (VENTOLIN HFA) 108 (90 Base) MCG/ACT inhaler Inhale 1 puff into the lungs every 6 (six) hours as needed for wheezing or shortness of breath. 8 g 2   allopurinol (ZYLOPRIM) 300 MG tablet TAKE ONE TABLET BY MOUTH EVERY EVENING (Patient taking differently: Take 300 mg by mouth every evening.) 90 tablet 0   amLODipine (NORVASC) 5 MG tablet Take 1 tablet (5 mg total) by mouth daily. 90  tablet 3   Ascorbic Acid (VITAMIN C) 1000 MG tablet Take 1,000 mg by mouth every morning.     aspirin-acetaminophen-caffeine (EXCEDRIN EXTRA STRENGTH) 250-250-65 MG tablet Take 2 tablets by mouth 4 (four) times daily as needed for headache or migraine (pain).     atorvastatin (LIPITOR) 80 MG tablet TAKE ONE TABLET BY MOUTH EVERY EVENING (Patient taking differently: Take 80 mg by mouth every evening.) 90 tablet 3   Calcium Carb-Cholecalciferol (CALCIUM 600 + D PO) Take 1 tablet by mouth every morning.     cholecalciferol (VITAMIN D) 25 MCG (1000 UNIT) tablet Take 1,000 Units by mouth every morning.     CRANBERRY EXTRACT PO Take 4,200 mg by mouth 2 (two) times daily.     DULoxetine (CYMBALTA) 60 MG capsule TAKE ONE CAPSULE BY MOUTH EVERY EVENING (Patient taking differently: Take 60 mg by mouth every evening.) 90 capsule 0   EPINEPHRINE 0.3 mg/0.3 mL IJ SOAJ injection USE AS DIRECTED (Patient taking differently: Inject 0.3 mg into the muscle as needed for anaphylaxis (severe allergic reaction).) 2 each 2   esomeprazole (NEXIUM) 20 MG capsule Take 1 capsule (20 mg total) by mouth 2 (two) times daily before a meal. 90 capsule 3   fluorometholone (FML) 0.1 % ophthalmic suspension Place 1 drop into both eyes daily as needed (dry eyes/irritation).     levocetirizine (XYZAL) 5 MG tablet Take 5 mg by mouth daily as needed (seasonal allergies).     levothyroxine (SYNTHROID) 112 MCG tablet TAKE ONE TABLET BY MOUTH EVERY EVENING (Patient taking differently: Take 112 mcg by mouth every evening.) 90 tablet 2   metFORMIN (GLUCOPHAGE XR) 500 MG 24 hr tablet Take 1 tablet (500 mg total) by mouth at bedtime. 90 tablet 1   Multiple Vitamin (MULTIVITAMIN WITH MINERALS) TABS tablet Take 1 tablet by mouth every morning. One a Day for Men over 50     Polyethyl Glycol-Propyl Glycol (SYSTANE) 0.4-0.3 % SOLN Place 1 drop into both eyes 2 (two) times daily as needed (dry eyes).     Probiotic Product (PROBIOTIC PO) Take 1  capsule by mouth every morning.     propranolol (INDERAL) 60 MG tablet TAKE ONE TABLET BY MOUTH EVERY MORNING and TAKE ONE TABLET BY MOUTH EVERY EVENING (Patient taking differently: Take 60 mg by mouth 2 (two) times daily.) 180 tablet 4   testosterone (ANDROGEL) 50 MG/5GM (1%) GEL apply FIVE grams (ONE PACKET) onto THE SKIN TWICE DAILY (Patient taking differently: Place 5 g onto the skin See admin instructions. Apply 5 g topically to skin up to two times daily) 300 g 4   Ubrogepant (UBRELVY) 50 MG TABS Take one at first sign of migraine. May repeat once in 2 hours. (Patient taking differently: Take 50 mg by mouth See admin instructions. Take one tablet (50 mg) by mouth at first sign of migraine; may repeat once in 2 hours if still needed) 10 tablet 2   zinc gluconate 50 MG tablet Take  50 mg by mouth every morning.     No current facility-administered medications for this visit.     Review of Systems    He denies chest pain, palpitations, dyspnea, pnd, orthopnea, n, v, dizziness, syncope, edema, weight gain, or early satiety. All other systems reviewed and are otherwise negative except as noted above.   Physical Exam    VS:  BP (!) 142/86   Pulse 79   Ht 6' (1.829 m)   Wt 238 lb (108 kg)   BMI 32.28 kg/m    Vitals:   04/30/21 0816 04/30/21 0842  BP: (!) 156/90 (!) 142/86  Pulse: 79   Weight: 238 lb (108 kg)   Height: 6' (1.829 m)     GEN: Well nourished, well developed, in no acute distress. HEENT: normal. Neck: Supple, no JVD, carotid bruits, or masses. Cardiac: RRR, no murmurs, rubs, or gallops. No clubbing, cyanosis, edema.  Radials/DP/PT 2+ and equal bilaterally. Radial site without bruising, bleeding or hematoma.  Respiratory:  Respirations regular and unlabored, clear to auscultation bilaterally. GI: Soft, nontender, nondistended, BS + x 4. MS: no deformity or atrophy. Skin: warm and dry, no rash. Neuro:  Strength and sensation are intact. Psych: Normal  affect.  Accessory Clinical Findings    ECG personally reviewed by me today - No EKG in office today.  Lab Results  Component Value Date   WBC 8.0 03/30/2021   HGB 15.0 03/30/2021   HCT 47.8 03/30/2021   MCV 97.8 03/30/2021   PLT 212 03/30/2021   Lab Results  Component Value Date   CREATININE 1.32 (H) 03/30/2021   BUN 16 03/30/2021   NA 138 03/30/2021   K 4.5 03/30/2021   CL 102 03/30/2021   CO2 28 03/30/2021   Lab Results  Component Value Date   ALT 26 03/29/2021   AST 38 03/29/2021   ALKPHOS 86 03/29/2021   BILITOT 0.4 03/29/2021   Lab Results  Component Value Date   CHOL 97 03/30/2021   HDL 32 (L) 03/30/2021   LDLCALC 38 03/30/2021   TRIG 134 03/30/2021   CHOLHDL 3.0 03/30/2021    Lab Results  Component Value Date   HGBA1C 6.4 (H) 03/29/2021    Assessment & Plan     1. CAD s/p NSTEMI: S/p NSTEMI (2018 and 2022). Cath 03/29/21 showed stable 2-vessel CAD, with patent stens to pLAD/pLCx, with 40% mLAD and 20% mRCA stenosis. Medical management advised. Echo showed EF 60-65%, GIDD, mild LAE. Not on ASA due to daily Excedrin use in the setting of migraines. Stable with no anginal symptoms. Declines cardiac rehab referral at this time. Continue Lipitor, propanolol. Discussed possibility of anti-anginal agent should he have recurrent symptoms, would consider Ranexa over Imdur given persistent migraines.   2. Grade I diastolic dysfunction: Recent echo as above. Euvolemic and well compensated on exam.No indication for loop diuretic at this time. On propanolol (for migraines), continue same.  3. Hypertension: BP elevated in office today, 156/90, recheck 142/86. He has had persistently elevated home BP readings >130/80. He took amlodipine in the past, he is not sure why this was stopped. Will re-start amlodipine 5 mg daily. He will continue to monitor BP and report BP consistently elevated >130/80. If BP persistently >130/80 plan to increase Amlodipine.   4. Hyperlipidemia  LDL goal <70: LDL 38 on 03/30/21. Continue Lipitor.   5. Disposition: Start amlodipine 5 mg daily. Follow-up in 4 months with Dr. Martinique.   Lenna Sciara, NP 04/30/2021, 11:51 AM

## 2021-04-30 ENCOUNTER — Other Ambulatory Visit: Payer: Self-pay

## 2021-04-30 ENCOUNTER — Ambulatory Visit (HOSPITAL_BASED_OUTPATIENT_CLINIC_OR_DEPARTMENT_OTHER): Payer: Medicare Other | Admitting: Nurse Practitioner

## 2021-04-30 ENCOUNTER — Encounter (HOSPITAL_BASED_OUTPATIENT_CLINIC_OR_DEPARTMENT_OTHER): Payer: Self-pay | Admitting: Nurse Practitioner

## 2021-04-30 VITALS — BP 142/86 | HR 79 | Ht 72.0 in | Wt 238.0 lb

## 2021-04-30 DIAGNOSIS — I5189 Other ill-defined heart diseases: Secondary | ICD-10-CM | POA: Diagnosis not present

## 2021-04-30 DIAGNOSIS — I10 Essential (primary) hypertension: Secondary | ICD-10-CM | POA: Diagnosis not present

## 2021-04-30 DIAGNOSIS — I2510 Atherosclerotic heart disease of native coronary artery without angina pectoris: Secondary | ICD-10-CM

## 2021-04-30 DIAGNOSIS — E785 Hyperlipidemia, unspecified: Secondary | ICD-10-CM | POA: Diagnosis not present

## 2021-04-30 MED ORDER — AMLODIPINE BESYLATE 5 MG PO TABS: 5.0000 mg | ORAL_TABLET | Freq: Every day | ORAL | 3 refills | Status: DC

## 2021-04-30 NOTE — Patient Instructions (Signed)
Medication Instructions:  Your physician has recommended you make the following change in your medication:   Start: Amlodipine (Norvasc) 5mg  tablet daily   *If you need a refill on your cardiac medications before your next appointment, please call your pharmacy*   Lab Work: None ordered today   Testing/Procedures: None ordered today    Follow-Up: At [facility], you and your health needs are our priority.  As part of our continuing mission to provide you with exceptional heart care, we have created designated Provider Care Teams.  These Care Teams include your primary Cardiologist (physician) and Advanced Practice Providers (APPs -  Physician Assistants and Nurse Practitioners) who all work together to provide you with the care you need, when you need it.  We recommend signing up for the patient portal called "MyChart".  Sign up information is provided on this After Visit Summary.  MyChart is used to connect with patients for Virtual Visits (Telemedicine).  Patients are able to view lab/test results, encounter notes, upcoming appointments, etc.  Non-urgent messages can be sent to your provider as well.   To learn more about what you can do with MyChart, go to [email or web address].    Your next appointment:   Cancel January appointment with Dr. Martinique and follow up in April   The format for your next appointment:   In Person  Provider:   [name], MD     Other Instructions Please call our office if you are having blood pressure consistently higher than 130/80

## 2021-05-17 ENCOUNTER — Telehealth: Payer: Self-pay

## 2021-05-17 NOTE — Progress Notes (Signed)
Chronic Care Management Pharmacy Assistant   Name: Dennis Moon  MRN: [id number] DOB: May 30, 1945  Reason for Encounter: Medication Review/Medication coordination call.   Recent office visits:  04/26/2021 Dr. Ethelene Hal MD (PCP) Start Metformin 500 mg Nightly,  Return in about 3 months   Recent consult visits:  04/30/2021 Diona Browner NP (Cardiology) Re Start Amlodipine 5 mg daily Follow-up in 4 months   Hospital visits:  None in previous 6 months  Medications: Outpatient Encounter Medications as of 05/17/2021  Medication Sig Note   albuterol (VENTOLIN HFA) 108 (90 Base) MCG/ACT inhaler Inhale 1 puff into the lungs every 6 (six) hours as needed for wheezing or shortness of breath.    allopurinol (ZYLOPRIM) 300 MG tablet TAKE ONE TABLET BY MOUTH EVERY EVENING (Patient taking differently: Take 300 mg by mouth every evening.)    amLODipine (NORVASC) 5 MG tablet Take 1 tablet (5 mg total) by mouth daily.    Ascorbic Acid (VITAMIN C) 1000 MG tablet Take 1,000 mg by mouth every morning.    aspirin-acetaminophen-caffeine (EXCEDRIN EXTRA STRENGTH) 250-250-65 MG tablet Take 2 tablets by mouth 4 (four) times daily as needed for headache or migraine (pain).    atorvastatin (LIPITOR) 80 MG tablet TAKE ONE TABLET BY MOUTH EVERY EVENING (Patient taking differently: Take 80 mg by mouth every evening.)    Calcium Carb-Cholecalciferol (CALCIUM 600 + D PO) Take 1 tablet by mouth every morning.    cholecalciferol (VITAMIN D) 25 MCG (1000 UNIT) tablet Take 1,000 Units by mouth every morning.    CRANBERRY EXTRACT PO Take 4,200 mg by mouth 2 (two) times daily.    DULoxetine (CYMBALTA) 60 MG capsule TAKE ONE CAPSULE BY MOUTH EVERY EVENING (Patient taking differently: Take 60 mg by mouth every evening.)    EPINEPHRINE 0.3 mg/0.3 mL IJ SOAJ injection USE AS DIRECTED (Patient taking differently: Inject 0.3 mg into the muscle as needed for anaphylaxis (severe allergic reaction).)    esomeprazole (NEXIUM) 20 MG  capsule Take 1 capsule (20 mg total) by mouth 2 (two) times daily before a meal. 03/29/2021: OTC   fluorometholone (FML) 0.1 % ophthalmic suspension Place 1 drop into both eyes daily as needed (dry eyes/irritation).    levocetirizine (XYZAL) 5 MG tablet Take 5 mg by mouth daily as needed (seasonal allergies).    levothyroxine (SYNTHROID) 112 MCG tablet TAKE ONE TABLET BY MOUTH EVERY EVENING (Patient taking differently: Take 112 mcg by mouth every evening.)    metFORMIN (GLUCOPHAGE XR) 500 MG 24 hr tablet Take 1 tablet (500 mg total) by mouth at bedtime.    Multiple Vitamin (MULTIVITAMIN WITH MINERALS) TABS tablet Take 1 tablet by mouth every morning. One a Day for Men over 50    Polyethyl Glycol-Propyl Glycol (SYSTANE) 0.4-0.3 % SOLN Place 1 drop into both eyes 2 (two) times daily as needed (dry eyes).    Probiotic Product (PROBIOTIC PO) Take 1 capsule by mouth every morning.    propranolol (INDERAL) 60 MG tablet TAKE ONE TABLET BY MOUTH EVERY MORNING and TAKE ONE TABLET BY MOUTH EVERY EVENING (Patient taking differently: Take 60 mg by mouth 2 (two) times daily.)    testosterone (ANDROGEL) 50 MG/5GM (1%) GEL apply FIVE grams (ONE PACKET) onto THE SKIN TWICE DAILY (Patient taking differently: Place 5 g onto the skin See admin instructions. Apply 5 g topically to skin up to two times daily)    Ubrogepant (UBRELVY) 50 MG TABS Take one at first sign of migraine. May repeat once in  2 hours. (Patient taking differently: Take 50 mg by mouth See admin instructions. Take one tablet (50 mg) by mouth at first sign of migraine; may repeat once in 2 hours if still needed)    zinc gluconate 50 MG tablet Take 50 mg by mouth every morning.    No facility-administered encounter medications on file as of 05/17/2021.   Care Gaps: Shingrix Vaccine Star Rating Drugs: Atorvastatin 80 mg last filled 04/22/2021 for a 30-Day supply at [facility] Metformin ER 500 mg last filled 04/26/2021 for 90 day supply at  [facility]. Medication Fill Gaps: None  Reviewed chart for medication changes ahead of medication coordination call.  BP Readings from Last 3 Encounters:  04/30/21 (!) 142/86  04/26/21 132/78  03/30/21 (!) 145/92    Lab Results  Component Value Date   HGBA1C 6.4 (H) 03/29/2021     Patient obtains medications through Adherence Packaging  30 Days   Last adherence delivery included:  Allopurinol 300 mg One  tablet daily - Evening meals  Duloxetine 60 mg One Capsule daily - Evening Meals  Levothyroxine 112 mcg  One Tablet Daily- Evening Meals    Propranolol 60 mg Tablet  Take 1 tablet by mouth twice Daily- Breakfast   & Evening Meals   Atorvastatin 80 mg  One Tablet daily - Evening Meals  Albuterol 108  MCG/ACT inhaler  1 puff into the lungs every 6 (six) hours as needed for wheezing or shortness of breath Amlodipine 5 mg one table daily - Breakfast  Patient declined medications last month  EPINEPHRINE 0.3 mg/0.3 mL IJ SOAJ injection PRN (Patient states he will inform me when he needs a refill)  Testosterone 50mg /5g (1%) gel 1 packet apply five grams (ONE PACKET) onto the skin twice daily  Vitamin C 1000 mg daily  -OTC Excedrin Extra Strength  Every 6 hours PRN - Vial (OTC) Calcium carbonate 600 mg-OTC Vitamin D 1000 units  - OTC CoQ10 300 mg  - OTC Cranberry Extract 4200 mg - OTC Esomeprazole 20 mg BID  - OTC  Xyzal 5 mg daily PRN - Vial -OTC Magnesium Oxide 400 mg  - OTC Centrum Silver Mens  - OTC Grapeseed Extract  100 mg - OTC Probiotic Daily- OTC Turmeric (unknown dose) - OTC  Zinc Gluconate 50 mg  - OTC Metformin XR 500 mg one tablet daily -Bedtime (Adequate supply , 90 day supply filled on  04/26/2021 at [place]).  Patient is due for next adherence delivery on: 05/28/2021. Called patient and reviewed medications and coordinated delivery.  Unable to reach patient to completed Medication Coordination form. Form was completed based on last month  delivery. [facility] will contact patient to confirm delivery.Dennis Moon, CPP was notified I was unable to reach patient  This delivery to include: Allopurinol 300 mg One  tablet daily - Evening meals  Duloxetine 60 mg One Capsule daily - Evening Meals  Levothyroxine 112 mcg  One Tablet Daily- Evening Meals    Propranolol 60 mg Tablet  Take 1 tablet by mouth twice Daily- Breakfast   & Evening Meals   Atorvastatin 80 mg  One Tablet daily - Evening Meals  Amlodipine 5 mg one table daily - Breakfast   Patient declined the following medications: Albuterol 108  MCG/ACT inhaler  1 puff into the lungs every 6 (six) hours as needed for wheezing or shortness of breath EPINEPHRINE 0.3 mg/0.3 mL IJ SOAJ injection PRN (Patient states he will inform me when he needs a refill)  Testosterone 50mg /5g (1%) gel 1 packet apply five grams (ONE PACKET) onto the skin twice daily  Vitamin C 1000 mg daily  -OTC Excedrin Extra Strength  Every 6 hours PRN - Vial (OTC) Calcium carbonate 600 mg-OTC Vitamin D 1000 units  - OTC CoQ10 300 mg  - OTC Cranberry Extract 4200 mg - OTC Esomeprazole 20 mg BID  - OTC  Xyzal 5 mg daily PRN - Vial -OTC Magnesium Oxide 400 mg  - OTC Centrum Silver Mens  - OTC Grapeseed Extract  100 mg - OTC Probiotic Daily- OTC Turmeric (unknown dose) - OTC  Zinc Gluconate 50 mg  - OTC Metformin XR 500 mg one tablet daily -Bedtime (Adequate supply , 90 day supply filled on  04/26/2021 at [place]).  Patient needs refills for None ID.  Confirmed delivery date of 05/28/2021, advised patient that pharmacy will contact them the morning of delivery.  [place] Pharmacist Assistant [phone]

## 2021-05-23 ENCOUNTER — Other Ambulatory Visit: Payer: Self-pay | Admitting: Family Medicine

## 2021-05-24 ENCOUNTER — Ambulatory Visit: Payer: Medicare Other | Admitting: Cardiology

## 2021-06-20 ENCOUNTER — Other Ambulatory Visit: Payer: Self-pay | Admitting: Family Medicine

## 2021-06-20 ENCOUNTER — Other Ambulatory Visit: Payer: Self-pay | Admitting: Physician Assistant

## 2021-06-20 DIAGNOSIS — J45909 Unspecified asthma, uncomplicated: Secondary | ICD-10-CM

## 2021-06-20 NOTE — Telephone Encounter (Signed)
Next Visit: Due November 2022. Message sent to the front to schedule patient.    Last Visit: 10/05/2020   Last Fill: 03/26/2021   DX: Idiopathic chronic gout of multiple sites without tophus   Current Dose per office note 10/05/2020: allopurinol 300mg  po qd   Labs: 03/30/2021 Creat. 1.32, GFR 56   Okay to refill Allopurinol?

## 2021-06-20 NOTE — Telephone Encounter (Signed)
Please schedule patient for a follow up visit. Patient was due November 2022. Thanks!

## 2021-07-08 NOTE — Progress Notes (Signed)
Office Visit Note  Patient: Dennis Moon             Date of Birth: 02/25/1946           MRN: [id number]             PCP: [name], MD Referring: [name],* Visit Date: 07/19/2021 Occupation: @GUAROCC @  Subjective:  Sicca symptoms   History of Present Illness: GLADYS GUTMAN is a 76 y.o. male with history of Sjogren's syndrome, cutaneous lupus, gout, and osteoarthritis.  He has ongoing sicca symptoms secondary to Sjogren's syndrome.  Overall his symptoms have been tolerable.  He uses Systane and artificial tears for symptomatic relief and tries to drink water throughout the day to help with his mouth dryness.  He denies any sores in his mouth or nose recently.  He denies any swollen lymph nodes.  He has not had any increased shortness of breath or pleuritic chest pain.  He denies any signs or symptoms of a gout flare since his last office visit.  He is taking allopurinol 300 mg 1 tablet daily for management of gout. He denies any recent cutaneous lupus flares.  He is no longer taking Plaquenil but has not had any recurrence in symptoms.  He continues to see the dermatologist every 6 months and wear sunscreen daily. He has occasional discomfort in his lower back and has been using a heating pad and has changed his activity level accordingly.  He denies any other joint pain or joint swelling at this time.    Activities of Daily Living:  Patient reports morning stiffness for several hours.   Patient Denies nocturnal pain.  Difficulty dressing/grooming: Denies Difficulty climbing stairs: Denies Difficulty getting out of chair: Denies Difficulty using hands for taps, buttons, cutlery, and/or writing: Denies  Review of Systems  Constitutional:  Positive for fatigue.  HENT:  Positive for mouth dryness and nose dryness.   Eyes:  Positive for dryness. Negative for pain and itching.  Respiratory:  Negative for shortness of breath and difficulty breathing.    Cardiovascular:  Negative for chest pain and palpitations.  Gastrointestinal:  Negative for blood in stool, constipation and diarrhea.  Endocrine: Negative for increased urination.  Genitourinary:  Negative for difficulty urinating.  Musculoskeletal:  Positive for joint pain, joint pain, myalgias, morning stiffness, muscle tenderness and myalgias. Negative for joint swelling.  Skin:  Positive for color change. Negative for rash and redness.  Allergic/Immunologic: Negative for susceptible to infections.  Neurological:  Positive for numbness and headaches. Negative for dizziness, memory loss and weakness.  Hematological:  Negative for bruising/bleeding tendency.  Psychiatric/Behavioral:  Negative for confusion.    PMFS History:  Patient Active Problem List   Diagnosis Date Noted   Hospital discharge follow-up 04/26/2021   Prediabetes 04/26/2021   Unstable angina ([place]) 03/29/2021   Chest pain 03/29/2021   Gastroesophageal reflux disease 09/07/2020   Allergy to honey bee venom 09/07/2020   History of gout 12/29/2019   Elevated cholesterol 12/29/2019   Healthcare maintenance 12/29/2019   Nonintractable headache 06/24/2019   Androgen deficiency 11/17/2017   Hypothyroidism 11/17/2017   Cyst of tonsil 06/11/2017   Obesity (BMI 30.0-34.9) 03/12/2017   CAD S/P percutaneous coronary angioplasty 12/13/2016   NSTEMI (non-ST elevated myocardial infarction) ([place]) 12/11/2016   Gout of multiple sites 08/26/2016   Chronic renal impairment 08/26/2016   Chronic kidney disease 08/26/2016   Sjogren's syndrome ([place]) 04/23/2016   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   Idiopathic chronic gout,  unspecified site, without tophus (tophi) 04/23/2016   High risk medication use 04/23/2016   Primary osteoarthritis of both hands 04/23/2016   DDD (degenerative disc disease), lumbar 04/23/2016   Kidney disease 04/23/2016    Past Medical History:  Diagnosis Date   CAD S/P percutaneous coronary angioplasty 12/12/2016    a. 11/2016: NSTEMI with DES to LAD and DES to LCx; b. MV low risk 2021; c. NSTEMI 11/22, LHC patent stents to LAD/LCx, 40% mLAD, 20% mRCA, stable, med mgmt. Echo 11/22 EF 60-65%, GIDD, mild LAE.   CKD (chronic kidney disease), stage III (HCC)    GERD (gastroesophageal reflux disease)    Gout    Hyperlipidemia LDL goal <70    Hypertension    Hypothyroidism    Migraine    Osteoarthritis    Sjogren's disease ([place])    Systemic lupus erythematosus ([place])     Family History  Problem Relation Age of Onset   Diabetes Mother    Hypertension Mother    Hypertension Father    Healthy Son    Past Surgical History:  Procedure Laterality Date   CATARACT EXTRACTION, BILATERAL Bilateral 2017   COLONOSCOPY  2021   CORONARY STENT INTERVENTION N/A 12/12/2016   Procedure: Coronary Stent Intervention;  Surgeon: Martinique, Peter M, MD;  Location: [facility];  Service: Cardiovascular;  Laterality: N/A;   HERNIA REPAIR     LEFT HEART CATH AND CORONARY ANGIOGRAPHY N/A 12/12/2016   Procedure: Left Heart Cath and Coronary Angiography;  Surgeon: Martinique, Peter M, MD;  Location: [facility];  Service: Cardiovascular;  Laterality: N/A;   LEFT HEART CATH AND CORONARY ANGIOGRAPHY N/A 03/29/2021   Procedure: LEFT HEART CATH AND CORONARY ANGIOGRAPHY;  Surgeon: Burnell Blanks, MD;  Location: [facility];  Service: Cardiovascular;  Laterality: N/A;   THYROID SURGERY  1997   Social History   Social History Narrative   Not on file   Immunization History  Administered Date(s) Administered   Influenza Whole 02/25/2021   Influenza-Unspecified 02/24/2018, 02/04/2019, 02/16/2020   PFIZER(Purple Top)SARS-COV-2 Vaccination 06/17/2019, 07/08/2019, 02/16/2020   Pneumococcal Conjugate-13 10/21/2013   Pneumococcal Polysaccharide-23 08/02/2008, 03/14/2016   Tdap 07/18/2019   Zoster Recombinat (Shingrix) 11/12/2017   Zoster, Live 08/28/2006     Objective: Vital Signs: BP (!) 144/81 (BP  Location: Left Arm, Patient Position: Sitting, Cuff Size: Large)    Pulse 74    Ht 6' (1.829 m)    Wt 238 lb 3.2 oz (108 kg)    BMI 32.31 kg/m    Physical Exam Vitals and nursing note reviewed.  Constitutional:      Appearance: He is well-developed.  HENT:     Head: Normocephalic and atraumatic.  Eyes:     Conjunctiva/sclera: Conjunctivae normal.     Pupils: Pupils are equal, round, and reactive to light.  Cardiovascular:     Rate and Rhythm: Normal rate and regular rhythm.     Heart sounds: Normal heart sounds.  Pulmonary:     Effort: Pulmonary effort is normal.     Breath sounds: Normal breath sounds.  Abdominal:     General: Bowel sounds are normal.     Palpations: Abdomen is soft.  Musculoskeletal:     Cervical back: Normal range of motion and neck supple.  Skin:    General: Skin is warm and dry.     Capillary Refill: Capillary refill takes less than 2 seconds.  Neurological:     Mental Status: He is alert and  oriented to person, place, and time.  Psychiatric:        Behavior: Behavior normal.     Musculoskeletal Exam: C-spine has good range of motion with no discomfort.  Some midline spinal tenderness in the lumbar region.  Shoulder joints, elbow joints, wrist joints, MCPs, PIPs, DIPs have good range of motion with no synovitis.  He has PIP and DIP thickening consistent with osteoarthritis of both hands.  He was able to make a complete fist bilaterally.  Hip joints have good range of motion with no groin pain.  Knee joints have good range of motion with no warmth or effusion.  Ankle joints have good range of motion with no tenderness or joint swelling.  Some pedal edema noted bilaterally.  CDAI Exam: CDAI Score: -- Patient Global: --; Provider Global: -- Swollen: --; Tender: -- Joint Exam 07/19/2021   No joint exam has been documented for this visit   There is currently no information documented on the homunculus. Go to the Rheumatology activity and complete the  homunculus joint exam.  Investigation: No additional findings.  Imaging: No results found.  Recent Labs: Lab Results  Component Value Date   WBC 8.0 03/30/2021   HGB 15.0 03/30/2021   PLT 212 03/30/2021   NA 138 03/30/2021   K 4.5 03/30/2021   CL 102 03/30/2021   CO2 28 03/30/2021   GLUCOSE 97 03/30/2021   BUN 16 03/30/2021   CREATININE 1.32 (H) 03/30/2021   BILITOT 0.4 03/29/2021   ALKPHOS 86 03/29/2021   AST 38 03/29/2021   ALT 26 03/29/2021   PROT 6.8 03/29/2021   ALBUMIN 3.3 (L) 03/29/2021   CALCIUM 8.9 03/30/2021   GFRAA 59 (L) 07/01/2019    Speciality Comments: PLQ eye exam normal on 05/07/18  @ [facility].  Procedures:  No procedures performed Allergies: Bee venom   Assessment / Plan:     Visit Diagnoses: Sjogren's syndrome with other organ involvement ([place]) - Positive ANA, positive Ro, positive La: He has ongoing sicca symptoms which have been tolerable overall.  He has been using Systane and artificial tears as needed for symptomatic relief.  He tries to drink water throughout the day to alleviate his mouth dryness.  He has not had any recent oral or nasal ulcerations.  No cervical lymphadenopathy.  He is not currently taking any immunosuppressive agents.  He has no signs of inflammatory arthritis at this time.  No joint tenderness or synovitis was noted.  He has not been experiencing any shortness of breath, pleuritic chest pain, or cough.  His lungs were clear to auscultation on examination today. Discussed the associated risk of ILD, neuropathy, and lymphoma in patients with Sjogren's syndrome. The following lab work will be updated today.  He was advised to notify us if he develops any signs or symptoms of a flare.  He will follow up in the office in 5 months.   - Plan: CBC with Differential/Platelet, COMPLETE METABOLIC PANEL WITH GFR, Urinalysis, Routine w reflex microscopic, ANA, C3 and C4, Sedimentation rate, Rheumatoid factor, Serum protein  electrophoresis with reflex, Sjogrens syndrome-A extractable nuclear antibody, Sjogrens syndrome-B extractable nuclear antibody  Subacute cutaneous lupus erythematosus - He has not had any signs or symptoms of a flare.  Discussed the importance of wearing sunscreen SPF >50 on daily basis and to avoid direct sun exposure.  He continues to see his dermatologist every 6 months.  He was advised to notify us if he develop signs or symptoms of a  flare. Plan: ANA, C3 and C4, Sedimentation rate  High risk medication use - d/c PLQ due to abnormal eye exam 11/2018.  - Plan: CBC with Differential/Platelet, COMPLETE METABOLIC PANEL WITH GFR  Idiopathic chronic gout of multiple sites without tophus - He has not had any signs or symptoms of a gout flare.  He is clinically doing well taking allopurinol 300 mg 1 tablet by mouth daily.  Uric acid on 09/07/2020 was within the desirable range: 4.7.  Uric acid will be rechecked today.  - Plan: Uric acid  Primary osteoarthritis of both hands: He has PIP and DIP thickening consistent with osteoarthritis of both hands.  No tenderness or inflammation was noted on examination.  Discussed the importance of joint protection and muscle strengthening.    DDD (degenerative disc disease), lumbar: He experiences intermittent discomfort in his lower back typically exacerbated by physical activity.  No symptoms of radiculopathy at this time. He no longer is able to play golf or tennis.  He uses a heating pad as needed to alleviate his discomfort.   Other medical conditions are listed as follows:   CAD S/P percutaneous coronary angioplasty  History of chronic kidney disease - He is followed by nephrologist. CMP with GFR ordered today.   Blood glucose elevated -Patient requested to have HgbA1c rechecked today.  Plan: HgB A1c  History of non-ST elevation myocardial infarction (NSTEMI): He underwent a left heart catheterization on 03/29/2021.  Orders: Orders Placed This Encounter   Procedures   CBC with Differential/Platelet   COMPLETE METABOLIC PANEL WITH GFR   Urinalysis, Routine w reflex microscopic   ANA   C3 and C4   Sedimentation rate   Rheumatoid factor   Serum protein electrophoresis with reflex   Sjogrens syndrome-A extractable nuclear antibody   Sjogrens syndrome-B extractable nuclear antibody   Uric acid   HgB A1c   Meds ordered this encounter  Medications   allopurinol (ZYLOPRIM) 300 MG tablet    Sig: Take 1 tablet (300 mg total) by mouth every evening.    Dispense:  90 tablet    Refill:  0     Follow-Up Instructions: Return in about 5 months (around 12/19/2021) for Sjogren's syndrome, subacute cutaneous lupus, gout, and osteoarthritis.   Ofilia Neas, PA-C  Note - This record has been created using Dragon software.  Chart creation errors have been sought, but may not always  have been located. Such creation errors do not reflect on  the standard of medical care.

## 2021-07-12 ENCOUNTER — Ambulatory Visit: Payer: Medicare Other | Admitting: Physician Assistant

## 2021-07-19 ENCOUNTER — Ambulatory Visit: Payer: Medicare Other | Admitting: Physician Assistant

## 2021-07-19 ENCOUNTER — Other Ambulatory Visit: Payer: Self-pay

## 2021-07-19 ENCOUNTER — Encounter: Payer: Self-pay | Admitting: Physician Assistant

## 2021-07-19 VITALS — BP 144/81 | HR 74 | Ht 72.0 in | Wt 238.2 lb

## 2021-07-19 DIAGNOSIS — L931 Subacute cutaneous lupus erythematosus: Secondary | ICD-10-CM | POA: Diagnosis not present

## 2021-07-19 DIAGNOSIS — I2510 Atherosclerotic heart disease of native coronary artery without angina pectoris: Secondary | ICD-10-CM | POA: Diagnosis not present

## 2021-07-19 DIAGNOSIS — Z9861 Coronary angioplasty status: Secondary | ICD-10-CM

## 2021-07-19 DIAGNOSIS — M5136 Other intervertebral disc degeneration, lumbar region: Secondary | ICD-10-CM | POA: Diagnosis not present

## 2021-07-19 DIAGNOSIS — I252 Old myocardial infarction: Secondary | ICD-10-CM

## 2021-07-19 DIAGNOSIS — Z79899 Other long term (current) drug therapy: Secondary | ICD-10-CM

## 2021-07-19 DIAGNOSIS — M1A09X0 Idiopathic chronic gout, multiple sites, without tophus (tophi): Secondary | ICD-10-CM

## 2021-07-19 DIAGNOSIS — Z87448 Personal history of other diseases of urinary system: Secondary | ICD-10-CM

## 2021-07-19 DIAGNOSIS — M19042 Primary osteoarthritis, left hand: Secondary | ICD-10-CM

## 2021-07-19 DIAGNOSIS — M19041 Primary osteoarthritis, right hand: Secondary | ICD-10-CM | POA: Diagnosis not present

## 2021-07-19 DIAGNOSIS — M3509 Sicca syndrome with other organ involvement: Secondary | ICD-10-CM | POA: Diagnosis not present

## 2021-07-19 DIAGNOSIS — R739 Hyperglycemia, unspecified: Secondary | ICD-10-CM

## 2021-07-19 MED ORDER — ALLOPURINOL 300 MG PO TABS
300.0000 mg | ORAL_TABLET | Freq: Every evening | ORAL | 0 refills | Status: DC
Start: 2021-07-19 — End: 2021-10-22

## 2021-07-25 ENCOUNTER — Other Ambulatory Visit: Payer: Self-pay | Admitting: Family Medicine

## 2021-07-25 DIAGNOSIS — J45909 Unspecified asthma, uncomplicated: Secondary | ICD-10-CM

## 2021-07-25 LAB — URINALYSIS, ROUTINE W REFLEX MICROSCOPIC
Bacteria, UA: NONE SEEN /HPF
Bilirubin Urine: NEGATIVE
Glucose, UA: NEGATIVE
Hgb urine dipstick: NEGATIVE
Hyaline Cast: NONE SEEN /LPF
Ketones, ur: NEGATIVE
Leukocytes,Ua: NEGATIVE
Nitrite: NEGATIVE
RBC / HPF: NONE SEEN /HPF (ref 0–2)
Specific Gravity, Urine: 1.021 (ref 1.001–1.035)
Squamous Epithelial / HPF: NONE SEEN /HPF (ref <=5)
WBC, UA: NONE SEEN /HPF (ref 0–5)
pH: 6 (ref 5.0–8.0)

## 2021-07-25 LAB — COMPLETE METABOLIC PANEL WITH GFR
AG Ratio: 1.3 (calc) (ref 1.0–2.5)
ALT: 26 U/L (ref 9–46)
AST: 25 U/L (ref 10–35)
Albumin: 4.4 g/dL (ref 3.6–5.1)
Alkaline phosphatase (APISO): 92 U/L (ref 35–144)
BUN: 21 mg/dL (ref 7–25)
CO2: 32 mmol/L (ref 20–32)
Calcium: 9.4 mg/dL (ref 8.6–10.3)
Chloride: 101 mmol/L (ref 98–110)
Creat: 1.28 mg/dL (ref 0.70–1.28)
Globulin: 3.3 g/dL (calc) (ref 1.9–3.7)
Glucose, Bld: 100 mg/dL — ABNORMAL HIGH (ref 65–99)
Potassium: 4.6 mmol/L (ref 3.5–5.3)
Sodium: 143 mmol/L (ref 135–146)
Total Bilirubin: 0.5 mg/dL (ref 0.2–1.2)
Total Protein: 7.7 g/dL (ref 6.1–8.1)
eGFR: 58 mL/min/{1.73_m2} — ABNORMAL LOW (ref >=60)

## 2021-07-25 LAB — CBC WITH DIFFERENTIAL/PLATELET
Absolute Monocytes: 502 cells/uL (ref 200–950)
Basophils Absolute: 23 cells/uL (ref 0–200)
Basophils Relative: 0.4 %
Eosinophils Absolute: 182 cells/uL (ref 15–500)
Eosinophils Relative: 3.2 %
HCT: 47.2 % (ref 38.5–50.0)
Hemoglobin: 15.3 g/dL (ref 13.2–17.1)
Lymphs Abs: 1060 cells/uL (ref 850–3900)
MCH: 30.7 pg (ref 27.0–33.0)
MCHC: 32.4 g/dL (ref 32.0–36.0)
MCV: 94.6 fL (ref 80.0–100.0)
MPV: 11.4 fL (ref 7.5–12.5)
Monocytes Relative: 8.8 %
Neutro Abs: 3933 cells/uL (ref 1500–7800)
Neutrophils Relative %: 69 %
Platelets: 219 10*3/uL (ref 140–400)
RBC: 4.99 10*6/uL (ref 4.20–5.80)
RDW: 13.8 % (ref 11.0–15.0)
Total Lymphocyte: 18.6 %
WBC: 5.7 10*3/uL (ref 3.8–10.8)

## 2021-07-25 LAB — ANTI-NUCLEAR AB-TITER (ANA TITER): ANA Titer 1: 1:320 {titer} — ABNORMAL HIGH

## 2021-07-25 LAB — PROTEIN ELECTROPHORESIS, SERUM, WITH REFLEX
Albumin ELP: 4.1 g/dL (ref 3.8–4.8)
Alpha 1: 0.3 g/dL (ref 0.2–0.3)
Alpha 2: 0.8 g/dL (ref 0.5–0.9)
Beta 2: 0.5 g/dL (ref 0.2–0.5)
Beta Globulin: 0.4 g/dL (ref 0.4–0.6)
Gamma Globulin: 1.5 g/dL (ref 0.8–1.7)
Total Protein: 7.5 g/dL (ref 6.1–8.1)

## 2021-07-25 LAB — C3 AND C4
C3 Complement: 153 mg/dL (ref 82–185)
C4 Complement: 29 mg/dL (ref 15–53)

## 2021-07-25 LAB — ANA: Anti Nuclear Antibody (ANA): POSITIVE — AB

## 2021-07-25 LAB — SJOGRENS SYNDROME-A EXTRACTABLE NUCLEAR ANTIBODY: SSA (Ro) (ENA) Antibody, IgG: >8 AI — AB

## 2021-07-25 LAB — HEMOGLOBIN A1C
Hgb A1c MFr Bld: 5.9 % of total Hgb — ABNORMAL HIGH (ref <5.7)
Mean Plasma Glucose: 123 mg/dL
eAG (mmol/L): 6.8 mmol/L

## 2021-07-25 LAB — SEDIMENTATION RATE: Sed Rate: 14 mm/h (ref 0–20)

## 2021-07-25 LAB — RHEUMATOID FACTOR: Rheumatoid fact SerPl-aCnc: <14 IU/mL (ref <14)

## 2021-07-25 LAB — SJOGRENS SYNDROME-B EXTRACTABLE NUCLEAR ANTIBODY: SSB (La) (ENA) Antibody, IgG: >8 AI — AB

## 2021-07-25 LAB — URIC ACID: Uric Acid, Serum: 4.3 mg/dL (ref 4.0–8.0)

## 2021-07-25 NOTE — Progress Notes (Signed)
GFR is borderline low-58.  Rest of CMP WNL. CBC WNL.   ?UA revealed trace protein. Please add protein creatinine ratio if possible.  ?ANA remains positive but is a lower titer.  Ro and La antibodies remain positive.  ?Hgb A1c is borderline elevated but has improved-5.9%.  ? ?Complements WNL.  ESR WNL. RF negative.  SPEP did not reveal any abnormal proteins. Uric acid within the desirable range.  ? ?No changes recommended at this time.

## 2021-07-26 ENCOUNTER — Encounter: Payer: Self-pay | Admitting: Family Medicine

## 2021-07-26 ENCOUNTER — Ambulatory Visit (INDEPENDENT_AMBULATORY_CARE_PROVIDER_SITE_OTHER): Payer: Medicare Other | Admitting: Family Medicine

## 2021-07-26 ENCOUNTER — Other Ambulatory Visit: Payer: Self-pay

## 2021-07-26 ENCOUNTER — Other Ambulatory Visit: Payer: Self-pay | Admitting: *Deleted

## 2021-07-26 VITALS — BP 118/80 | HR 78 | Temp 97.4°F | Ht 72.0 in | Wt 238.4 lb

## 2021-07-26 DIAGNOSIS — M3509 Sicca syndrome with other organ involvement: Secondary | ICD-10-CM

## 2021-07-26 DIAGNOSIS — R739 Hyperglycemia, unspecified: Secondary | ICD-10-CM

## 2021-07-26 DIAGNOSIS — E7800 Pure hypercholesterolemia, unspecified: Secondary | ICD-10-CM

## 2021-07-26 DIAGNOSIS — R7303 Prediabetes: Secondary | ICD-10-CM

## 2021-07-26 DIAGNOSIS — Z8739 Personal history of other diseases of the musculoskeletal system and connective tissue: Secondary | ICD-10-CM | POA: Diagnosis not present

## 2021-07-26 DIAGNOSIS — J45909 Unspecified asthma, uncomplicated: Secondary | ICD-10-CM | POA: Diagnosis not present

## 2021-07-26 DIAGNOSIS — L931 Subacute cutaneous lupus erythematosus: Secondary | ICD-10-CM

## 2021-07-26 DIAGNOSIS — E039 Hypothyroidism, unspecified: Secondary | ICD-10-CM | POA: Diagnosis not present

## 2021-07-26 DIAGNOSIS — Z79899 Other long term (current) drug therapy: Secondary | ICD-10-CM

## 2021-07-26 DIAGNOSIS — Z87448 Personal history of other diseases of urinary system: Secondary | ICD-10-CM

## 2021-07-26 DIAGNOSIS — M1A09X0 Idiopathic chronic gout, multiple sites, without tophus (tophi): Secondary | ICD-10-CM

## 2021-07-26 NOTE — Progress Notes (Signed)
Protein creatinine ratio can be added to next lab work.

## 2021-07-26 NOTE — Progress Notes (Signed)
Established Patient Office Visit  Subjective:  Patient ID: Dennis Moon, male    DOB: Mar 17, 1946  Age: 76 y.o. MRN: [id number]  CC:  Chief Complaint  Patient presents with   Follow-up    3 month follow no questions not fasting    HPI Dennis Moon presents for follow-up of hypothyroidism, history of gout, elevated cholesterol, and mild reactive airway disease prediabetes.  Using his albuterol inhaler at most once or twice a week.  Has not had a gouty attack in over 10 years.  Renal function has been stable and currently also followed by nephrology Dr. Marisue Humble.  TSH is adjusted well.  Recent hemoglobin A1c was 5.9  Past Medical History:  Diagnosis Date   CAD S/P percutaneous coronary angioplasty 12/12/2016   a. 11/2016: NSTEMI with DES to LAD and DES to LCx; b. MV low risk 2021; c. NSTEMI 11/22, LHC patent stents to LAD/LCx, 40% mLAD, 20% mRCA, stable, med mgmt. Echo 11/22 EF 60-65%, GIDD, mild LAE.   CKD (chronic kidney disease), stage III (HCC)    GERD (gastroesophageal reflux disease)    Gout    Hyperlipidemia LDL goal <70    Hypertension    Hypothyroidism    Migraine    Osteoarthritis    Sjogren's disease (HCC)    Systemic lupus erythematosus (HCC)     Past Surgical History:  Procedure Laterality Date   CATARACT EXTRACTION, BILATERAL Bilateral 2017   COLONOSCOPY  2021   CORONARY STENT INTERVENTION N/A 12/12/2016   Procedure: Coronary Stent Intervention;  Surgeon: Swaziland, Peter M, MD;  Location: [facility] INVASIVE CV LAB;  Service: Cardiovascular;  Laterality: N/A;   HERNIA REPAIR     LEFT HEART CATH AND CORONARY ANGIOGRAPHY N/A 12/12/2016   Procedure: Left Heart Cath and Coronary Angiography;  Surgeon: Swaziland, Peter M, MD;  Location: [facility] INVASIVE CV LAB;  Service: Cardiovascular;  Laterality: N/A;   LEFT HEART CATH AND CORONARY ANGIOGRAPHY N/A 03/29/2021   Procedure: LEFT HEART CATH AND CORONARY ANGIOGRAPHY;  Surgeon: Kathleene Hazel, MD;  Location: [facility] INVASIVE CV  LAB;  Service: Cardiovascular;  Laterality: N/A;   THYROID SURGERY  1997    Family History  Problem Relation Age of Onset   Diabetes Mother    Hypertension Mother    Hypertension Father    Healthy Son     Social History   Socioeconomic History   Marital status: Married    Spouse name: Not on file   Number of children: Not on file   Years of education: Not on file   Highest education level: Not on file  Occupational History   Occupation: Dentist  Tobacco Use   Smoking status: Former    Packs/day: 1.00    Years: 6.00    Pack years: 6.00    Types: Cigarettes    Quit date: 04/25/1966    Years since quitting: 55.2    Passive exposure: Past   Smokeless tobacco: Never  Vaping Use   Vaping Use: Never used  Substance and Sexual Activity   Alcohol use: Yes    Alcohol/week: 7.0 standard drinks    Types: 7 Glasses of wine per week   Drug use: Never   Sexual activity: Not on file  Other Topics Concern   Not on file  Social History Narrative   Not on file   Social Determinants of Health   Financial Resource Strain: Low Risk    Difficulty of Paying Living Expenses: Not hard at all  Food  Insecurity: No Food Insecurity   Worried About Programme researcher, broadcasting/film/video in the Last Year: Never true   Ran Out of Food in the Last Year: Never true  Transportation Needs: Not on file  Physical Activity: Sufficiently Active   Days of Exercise per Week: 4 days   Minutes of Exercise per Session: 40 min  Stress: No Stress Concern Present   Feeling of Stress : Not at all  Social Connections: Moderately Integrated   Frequency of Communication with Friends and Family: More than three times a week   Frequency of Social Gatherings with Friends and Family: Three times a week   Attends Religious Services: More than 4 times per year   Active Member of Clubs or Organizations: No   Attends Banker Meetings: Never   Marital Status: Married  Catering manager Violence: Not At Risk   Fear of  Current or Ex-Partner: No   Emotionally Abused: No   Physically Abused: No   Sexually Abused: No    Outpatient Medications Prior to Visit  Medication Sig Dispense Refill   albuterol (VENTOLIN HFA) 108 (90 Base) MCG/ACT inhaler INHALE 1 PUFF BY MOUTH INTO LUNGS EVERY 6 HOURS AS NEEDED FOR SHORTNESS OF BREATH OR wheezing 8.5 g 0   allopurinol (ZYLOPRIM) 300 MG tablet Take 1 tablet (300 mg total) by mouth every evening. 90 tablet 0   amLODipine (NORVASC) 5 MG tablet Take 1 tablet (5 mg total) by mouth daily. 90 tablet 3   Ascorbic Acid (VITAMIN C) 1000 MG tablet Take 1,000 mg by mouth every morning.     aspirin-acetaminophen-caffeine (EXCEDRIN EXTRA STRENGTH) 250-250-65 MG tablet Take 2 tablets by mouth 4 (four) times daily as needed for headache or migraine (pain).     atorvastatin (LIPITOR) 80 MG tablet TAKE ONE TABLET BY MOUTH EVERY EVENING (Patient taking differently: Take 80 mg by mouth every evening.) 90 tablet 3   Calcium Carb-Cholecalciferol (CALCIUM 600 + D PO) Take 1 tablet by mouth every morning.     cholecalciferol (VITAMIN D) 25 MCG (1000 UNIT) tablet Take 1,000 Units by mouth every morning.     CRANBERRY EXTRACT PO Take 4,200 mg by mouth 2 (two) times daily.     DULoxetine (CYMBALTA) 60 MG capsule TAKE ONE CAPSULE BY MOUTH EVERY EVENING 90 capsule 2   EPINEPHRINE 0.3 mg/0.3 mL IJ SOAJ injection USE AS DIRECTED (Patient taking differently: Inject 0.3 mg into the muscle as needed for anaphylaxis (severe allergic reaction).) 2 each 2   esomeprazole (NEXIUM) 20 MG capsule Take 1 capsule (20 mg total) by mouth 2 (two) times daily before a meal. 90 capsule 3   fluorometholone (FML) 0.1 % ophthalmic suspension Place 1 drop into both eyes daily as needed (dry eyes/irritation).     levocetirizine (XYZAL) 5 MG tablet Take 5 mg by mouth daily as needed (seasonal allergies).     levothyroxine (SYNTHROID) 112 MCG tablet TAKE ONE TABLET BY MOUTH EVERY EVENING (Patient taking differently: Take 112  mcg by mouth every evening.) 90 tablet 2   metFORMIN (GLUCOPHAGE XR) 500 MG 24 hr tablet Take 1 tablet (500 mg total) by mouth at bedtime. 90 tablet 1   Multiple Vitamin (MULTIVITAMIN WITH MINERALS) TABS tablet Take 1 tablet by mouth every morning. One a Day for Men over 50     Polyethyl Glycol-Propyl Glycol (SYSTANE) 0.4-0.3 % SOLN Place 1 drop into both eyes 2 (two) times daily as needed (dry eyes).     Probiotic Product (PROBIOTIC PO)  Take 1 capsule by mouth every morning.     propranolol (INDERAL) 60 MG tablet TAKE ONE TABLET BY MOUTH EVERY MORNING and TAKE ONE TABLET BY MOUTH EVERY EVENING (Patient taking differently: Take 60 mg by mouth 2 (two) times daily.) 180 tablet 4   testosterone (ANDROGEL) 50 MG/5GM (1%) GEL apply FIVE grams (ONE PACKET) onto THE SKIN TWICE DAILY (Patient taking differently: Place 5 g onto the skin See admin instructions. Apply 5 g topically to skin up to two times daily) 300 g 4   Ubrogepant (UBRELVY) 50 MG TABS Take one at first sign of migraine. May repeat once in 2 hours. (Patient taking differently: Take 50 mg by mouth See admin instructions. Take one tablet (50 mg) by mouth at first sign of migraine; may repeat once in 2 hours if still needed) 10 tablet 2   zinc gluconate 50 MG tablet Take 50 mg by mouth every morning.     No facility-administered medications prior to visit.    Allergies  Allergen Reactions   Bee Venom Swelling    Facial, hand and foot swelling    ROS Review of Systems  Constitutional: Negative.   HENT: Negative.    Eyes:  Negative for photophobia and visual disturbance.  Respiratory: Negative.    Cardiovascular: Negative.   Gastrointestinal: Negative.   Endocrine: Negative for polyphagia and polyuria.  Genitourinary: Negative.   Musculoskeletal: Negative.   Neurological:  Negative for weakness and light-headedness.  Psychiatric/Behavioral: Negative.       Objective:    Physical Exam Vitals and nursing note reviewed.   Constitutional:      General: He is not in acute distress.    Appearance: Normal appearance. He is not ill-appearing, toxic-appearing or diaphoretic.  HENT:     Head: Normocephalic and atraumatic.     Right Ear: Tympanic membrane, ear canal and external ear normal.     Left Ear: Tympanic membrane, ear canal and external ear normal.     Mouth/Throat:     Mouth: Mucous membranes are moist.     Pharynx: Oropharynx is clear. No oropharyngeal exudate or posterior oropharyngeal erythema.  Eyes:     Extraocular Movements: Extraocular movements intact.     Conjunctiva/sclera: Conjunctivae normal.     Pupils: Pupils are equal, round, and reactive to light.  Cardiovascular:     Rate and Rhythm: Normal rate and regular rhythm.  Pulmonary:     Effort: Pulmonary effort is normal.     Breath sounds: Normal breath sounds.  Abdominal:     General: Bowel sounds are normal.  Musculoskeletal:     Cervical back: No rigidity or tenderness.  Lymphadenopathy:     Cervical: No cervical adenopathy.  Skin:    General: Skin is warm and dry.  Neurological:     General: No focal deficit present.     Mental Status: He is alert and oriented to person, place, and time.  Psychiatric:        Mood and Affect: Mood normal.        Behavior: Behavior normal.    BP 118/80 (BP Location: Left Arm, Patient Position: Sitting, Cuff Size: Normal)   Pulse 78   Temp (!) 97.4 F (36.3 C) (Temporal)   Ht 6' (1.829 m)   Wt 238 lb 6.4 oz (108.1 kg)   SpO2 98%   BMI 32.33 kg/m  Wt Readings from Last 3 Encounters:  07/26/21 238 lb 6.4 oz (108.1 kg)  07/19/21 238 lb 3.2 oz (108 kg)  04/30/21 238 lb (108 kg)     Health Maintenance Due  Topic Date Due   Zoster Vaccines- Shingrix (2 of 2) 01/07/2018    There are no preventive care reminders to display for this patient.  Lab Results  Component Value Date   TSH 3.325 03/29/2021   Lab Results  Component Value Date   WBC 5.7 07/19/2021   HGB 15.3 07/19/2021    HCT 47.2 07/19/2021   MCV 94.6 07/19/2021   PLT 219 07/19/2021   Lab Results  Component Value Date   NA 143 07/19/2021   K 4.6 07/19/2021   CO2 32 07/19/2021   GLUCOSE 100 (H) 07/19/2021   BUN 21 07/19/2021   CREATININE 1.28 07/19/2021   BILITOT 0.5 07/19/2021   ALKPHOS 86 03/29/2021   AST 25 07/19/2021   ALT 26 07/19/2021   PROT 7.7 07/19/2021   PROT 7.5 07/19/2021   ALBUMIN 3.3 (L) 03/29/2021   CALCIUM 9.4 07/19/2021   ANIONGAP 8 03/30/2021   EGFR 58 (L) 07/19/2021   GFR 55.51 (L) 09/07/2020   Lab Results  Component Value Date   CHOL 97 03/30/2021   Lab Results  Component Value Date   HDL 32 (L) 03/30/2021   Lab Results  Component Value Date   LDLCALC 38 03/30/2021   Lab Results  Component Value Date   TRIG 134 03/30/2021   Lab Results  Component Value Date   CHOLHDL 3.0 03/30/2021   Lab Results  Component Value Date   HGBA1C 5.9 (H) 07/19/2021      Assessment & Plan:   Problem List Items Addressed This Visit       Respiratory   Mild reactive airways disease     Endocrine   Hypothyroidism     Other   History of gout   Elevated cholesterol   Prediabetes - Primary    No orders of the defined types were placed in this encounter.   Follow-up: Return in about 6 months (around 01/26/2022).  Continue current medicine schedule .   Mliss Sax, MD

## 2021-08-20 ENCOUNTER — Other Ambulatory Visit: Payer: Self-pay | Admitting: Family Medicine

## 2021-08-20 DIAGNOSIS — E291 Testicular hypofunction: Secondary | ICD-10-CM

## 2021-08-26 ENCOUNTER — Telehealth: Payer: Self-pay

## 2021-08-26 MED ORDER — CLOBETASOL PROPIONATE 0.05 % EX CREA: TOPICAL_CREAM | CUTANEOUS | 0 refills | Status: DC

## 2021-08-26 NOTE — Telephone Encounter (Signed)
Next Visit: 01/24/2022 ? ?Last Visit: 07/19/2021 ? ?Last Fill: 02/04/2019 ? ?Dx: Sjogren's syndrome with other organ involvemen ? ?Current Dose per office note on 07/19/2021: not mentioned ? ?Okay to refill clobetasol?  ? ?

## 2021-08-26 NOTE — Telephone Encounter (Signed)
Angie from [facility] left a voicemail requesting prescription refill for the patient's Clobetasol ointment.  If you have any questions, please call back at [phone] ?

## 2021-09-01 NOTE — Progress Notes (Signed)
? ?Cardiology Office Note   ? ?Date:  09/06/2021  ? ?ID:  Dennis Moon, DOB [birth year], MRN [id number] ? ?PCP:  [name], MD  ?Cardiologist: Dr. Martinique ? ?Chief Complaint  ?Patient presents with  ? Coronary Artery Disease  ? ? ?History of Present Illness:   ? ?Dennis Moon is a 76 y.o. male with past medical history of HTN, HLD, and Stage 3 CKD who is seen for follow up CAD. He is also followed by Rheumatology for gout, subacute cutaneous lupus erythematosus, Sjogren's, osteoarthritis, and DDD.  ? ?He was  admitted to [facility] from 7/26 - 12/13/2016 for evaluation of chest pain.   EKG was without acute changes but his troponin values peaked at 0.40. A cardiac catheterization was performed which showed 2-vessel obstructive CAD with 90% Prox-LAD stenosis and 80% Prox Cx stenosis. PCI was performed with placement of DES to both the LAD and LCx. He was started on DAPT with ASA and Brilinta along with BB and statin therapy.  ? 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  ? ?He was seen in March 2021 after an episode of chest pain while eating cereal. A Myoview study was ordered and was normal. In retrospect he feels this was his reflux.  ? ?He presented to [facility] on 03/29/2021 with GERD symptoms/chest pain, ruled in for NSTEMI (HsTroponin 9>170). Cath showed stable 2-vessel CAD, with patent stents to pLAD/pLCx, with 40% mLAD and 20% mRCA stenosis. Medical management advised. Echo showed EF 60-65%, GIDD, mild LAE. He was discharged the following day in stable condition. He was started on metformin for elevated A1c 6.4%. last A1c down to 5.9%.  ? ?On follow up today he is doing very well. 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  No chest pain, dyspnea, palpitations. BP is well controlled. 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  ? ? ?Past Medical History:  ?Diagnosis Date  ? CAD S/P percutaneous coronary angioplasty 12/12/2016  ? a.  11/2016: NSTEMI with DES to LAD and DES to LCx; b. MV low risk 2021; c. NSTEMI 11/22, LHC patent stents to LAD/LCx, 40% mLAD, 20% mRCA, stable, med mgmt. Echo 11/22 EF 60-65%, GIDD, mild LAE.  ? CKD (chronic kidney disease), stage III ([place])   ? GERD (gastroesophageal reflux disease)   ? Gout   ? Hyperlipidemia LDL goal <70   ? Hypertension   ? Hypothyroidism   ? Migraine   ? Osteoarthritis   ? Sjogren's disease ([place])   ? Systemic lupus erythematosus ([place])   ? ? ?Past Surgical History:  ?Procedure Laterality Date  ? CATARACT EXTRACTION, BILATERAL Bilateral 2017  ? COLONOSCOPY  2021  ? CORONARY STENT INTERVENTION N/A 12/12/2016  ? Procedure: Coronary Stent Intervention;  Surgeon: Martinique, Gadge Hermiz M, MD;  Location: [place] CV LAB;  Service: Cardiovascular;  Laterality: N/A;  ? HERNIA REPAIR    ? LEFT HEART CATH AND CORONARY ANGIOGRAPHY N/A 12/12/2016  ? Procedure: Left Heart Cath and Coronary Angiography;  Surgeon: Martinique, Deaisa Merida M, MD;  Location: [place] CV LAB;  Service: Cardiovascular;  Laterality: N/A;  ? LEFT HEART CATH AND CORONARY ANGIOGRAPHY N/A 03/29/2021  ? Procedure: LEFT HEART CATH AND CORONARY ANGIOGRAPHY;  Surgeon: Burnell Blanks, MD;  Location: [place] CV LAB;  Service: Cardiovascular;  Laterality: N/A;  ? THYROID SURGERY  1997  ? ? ?Current Medications: ?Outpatient Medications Prior to Visit  ?  Medication Sig Dispense Refill  ? albuterol (VENTOLIN HFA) 108 (90 Base) MCG/ACT inhaler INHALE 1 PUFF BY MOUTH INTO LUNGS EVERY 6 HOURS AS NEEDED FOR SHORTNESS OF BREATH OR wheezing 8.5 g 0  ? allopurinol (ZYLOPRIM) 300 MG tablet Take 1 tablet (300 mg total) by mouth every evening. 90 tablet 0  ? amLODipine (NORVASC) 5 MG tablet Take 1 tablet (5 mg total) by mouth daily. 90 tablet 3  ? Ascorbic Acid (VITAMIN C) 1000 MG tablet Take 1,000 mg by mouth every morning.    ? aspirin-acetaminophen-caffeine (EXCEDRIN EXTRA STRENGTH) 250-250-65 MG tablet Take 2 tablets by mouth 4 (four) times daily as needed  for headache or migraine (pain).    ? atorvastatin (LIPITOR) 80 MG tablet TAKE ONE TABLET BY MOUTH EVERY EVENING (Patient taking differently: Take 80 mg by mouth every evening.) 90 tablet 3  ? Calcium Carb-Cholecalciferol (CALCIUM 600 + D PO) Take 1 tablet by mouth every morning.    ? cholecalciferol (VITAMIN D) 25 MCG (1000 UNIT) tablet Take 1,000 Units by mouth every morning.    ? clobetasol cream (TEMOVATE) 0.05 % Apply to affected area twice daily as needed for up to 2 weeks. 30 g 0  ? CRANBERRY EXTRACT PO Take 4,200 mg by mouth 2 (two) times daily.    ? DULoxetine (CYMBALTA) 60 MG capsule TAKE ONE CAPSULE BY MOUTH EVERY EVENING 90 capsule 2  ? EPINEPHRINE 0.3 mg/0.3 mL IJ SOAJ injection USE AS DIRECTED (Patient taking differently: Inject 0.3 mg into the muscle as needed for anaphylaxis (severe allergic reaction).) 2 each 2  ? esomeprazole (NEXIUM) 20 MG capsule Take 1 capsule (20 mg total) by mouth 2 (two) times daily before a meal. 90 capsule 3  ? fluorometholone (FML) 0.1 % ophthalmic suspension Place 1 drop into both eyes daily as needed (dry eyes/irritation).    ? levocetirizine (XYZAL) 5 MG tablet Take 5 mg by mouth daily as needed (seasonal allergies).    ? levothyroxine (SYNTHROID) 112 MCG tablet TAKE ONE TABLET BY MOUTH EVERY EVENING (Patient taking differently: Take 112 mcg by mouth every evening.) 90 tablet 2  ? metFORMIN (GLUCOPHAGE XR) 500 MG 24 hr tablet Take 1 tablet (500 mg total) by mouth at bedtime. 90 tablet 1  ? Multiple Vitamin (MULTIVITAMIN WITH MINERALS) TABS tablet Take 1 tablet by mouth every morning. One a Day for Men over 78    ? Polyethyl Glycol-Propyl Glycol (SYSTANE) 0.4-0.3 % SOLN Place 1 drop into both eyes 2 (two) times daily as needed (dry eyes).    ? Probiotic Product (PROBIOTIC PO) Take 1 capsule by mouth every morning.    ? propranolol (INDERAL) 60 MG tablet TAKE ONE TABLET BY MOUTH EVERY MORNING and TAKE ONE TABLET BY MOUTH EVERY EVENING (Patient taking differently: Take 60  mg by mouth 2 (two) times daily.) 180 tablet 4  ? testosterone (ANDROGEL) 50 MG/5GM (1%) GEL apply FIVE grams (ONE PACKET) onto THE SKIN TWICE DAILY 300 g 4  ? Ubrogepant (UBRELVY) 50 MG TABS Take one at first sign of migraine. May repeat once in 2 hours. (Patient taking differently: Take 50 mg by mouth See admin instructions. Take one tablet (50 mg) by mouth at first sign of migraine; may repeat once in 2 hours if still needed) 10 tablet 2  ? zinc gluconate 50 MG tablet Take 50 mg by mouth every morning.    ? ?No facility-administered medications prior to visit.  ?  ? ?Allergies:   Bee venom  ? ?  Social History  ? ?Socioeconomic History  ? Marital status: Married  ?  Spouse name: Not on file  ? Number of children: Not on file  ? Years of education: Not on file  ? Highest education level: Not on file  ?Occupational History  ? Occupation: Dentist  ?Tobacco Use  ? Smoking status: Former  ?  Packs/day: 1.00  ?  Years: 6.00  ?  Pack years: 6.00  ?  Types: Cigarettes  ?  Quit date: 04/25/1966  ?  Years since quitting: 55.4  ?  Passive exposure: Past  ? Smokeless tobacco: Never  ?Vaping Use  ? Vaping Use: Never used  ?Substance and Sexual Activity  ? Alcohol use: Yes  ?  Alcohol/week: 7.0 standard drinks  ?  Types: 7 Glasses of wine per week  ? Drug use: Never  ? Sexual activity: Not on file  ?Other Topics Concern  ? Not on file  ?Social History Narrative  ? Not on file  ? ?Social Determinants of Health  ? ?Financial Resource Strain: Low Risk   ? Difficulty of Paying Living Expenses: Not hard at all  ?Food Insecurity: No Food Insecurity  ? Worried About Charity fundraiser in the Last Year: Never true  ? Ran Out of Food in the Last Year: Never true  ?Transportation Needs: Not on file  ?Physical Activity: Sufficiently Active  ? Days of Exercise per Week: 4 days  ? Minutes of Exercise per Session: 40 min  ?Stress: No Stress Concern Present  ? Feeling of Stress : Not at all  ?Social Connections: Moderately Integrated  ?  Frequency of Communication with Friends and Family: More than three times a week  ? Frequency of Social Gatherings with Friends and Family: Three times a week  ? Attends Religious Services: More than 4 tim

## 2021-09-06 ENCOUNTER — Encounter: Payer: Self-pay | Admitting: Cardiology

## 2021-09-06 ENCOUNTER — Ambulatory Visit: Payer: Medicare Other | Admitting: Cardiology

## 2021-09-06 VITALS — BP 138/82 | HR 75 | Ht 72.0 in | Wt 238.0 lb

## 2021-09-06 DIAGNOSIS — E785 Hyperlipidemia, unspecified: Secondary | ICD-10-CM

## 2021-09-06 DIAGNOSIS — I10 Essential (primary) hypertension: Secondary | ICD-10-CM

## 2021-09-06 DIAGNOSIS — I2510 Atherosclerotic heart disease of native coronary artery without angina pectoris: Secondary | ICD-10-CM | POA: Diagnosis not present

## 2021-09-09 ENCOUNTER — Other Ambulatory Visit: Payer: Self-pay | Admitting: Family Medicine

## 2021-09-09 DIAGNOSIS — E039 Hypothyroidism, unspecified: Secondary | ICD-10-CM

## 2021-09-20 DIAGNOSIS — M3501 Sicca syndrome with keratoconjunctivitis: Secondary | ICD-10-CM | POA: Diagnosis not present

## 2021-09-20 DIAGNOSIS — D3132 Benign neoplasm of left choroid: Secondary | ICD-10-CM | POA: Diagnosis not present

## 2021-09-20 DIAGNOSIS — Z79899 Other long term (current) drug therapy: Secondary | ICD-10-CM | POA: Diagnosis not present

## 2021-09-24 DIAGNOSIS — H43813 Vitreous degeneration, bilateral: Secondary | ICD-10-CM | POA: Diagnosis not present

## 2021-09-24 DIAGNOSIS — T378X5D Adverse effect of other specified systemic anti-infectives and antiparasitics, subsequent encounter: Secondary | ICD-10-CM | POA: Diagnosis not present

## 2021-09-24 DIAGNOSIS — H35383 Toxic maculopathy, bilateral: Secondary | ICD-10-CM | POA: Diagnosis not present

## 2021-09-27 DIAGNOSIS — H35383 Toxic maculopathy, bilateral: Secondary | ICD-10-CM | POA: Diagnosis not present

## 2021-10-20 IMAGING — US US EXTREM LOW VENOUS*R*
1 series · 13 of 24 positions shown · non-contrast
Comparison: None.

CLINICAL DATA: Right lower extremity edema and pain



[Series 1: us extrem low venous*right* · 13 of 44 slices shown]
[im 1/44]
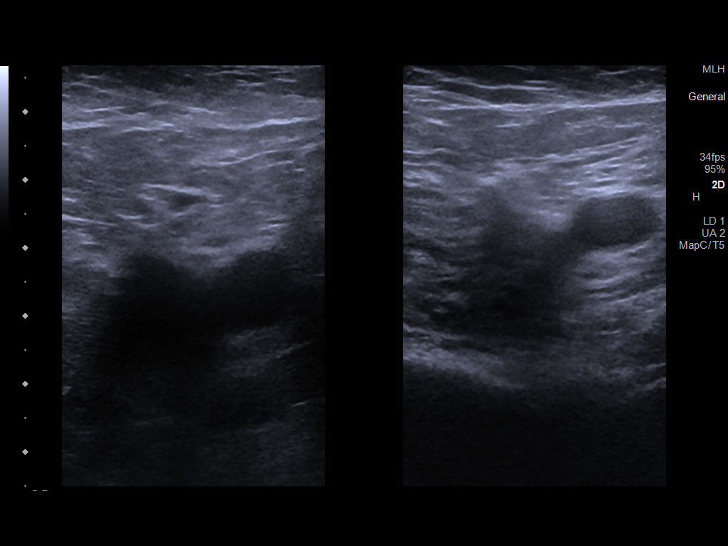
[im 4/44]
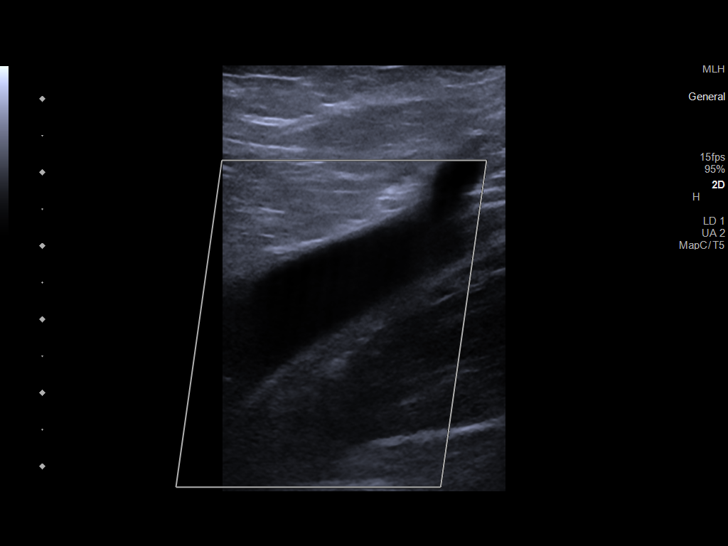
[im 8/44]
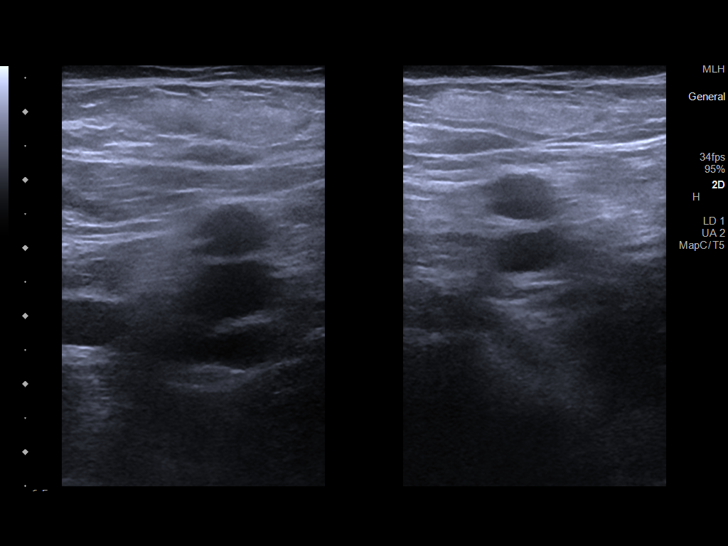
[im 12/44]
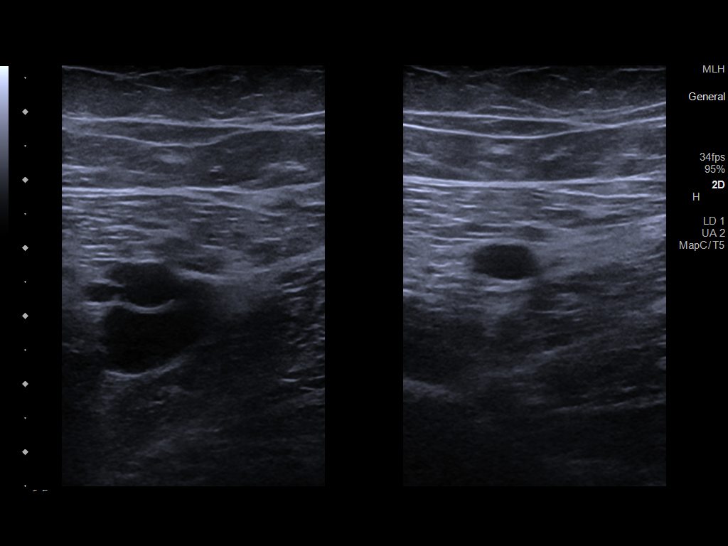
[im 15/44]
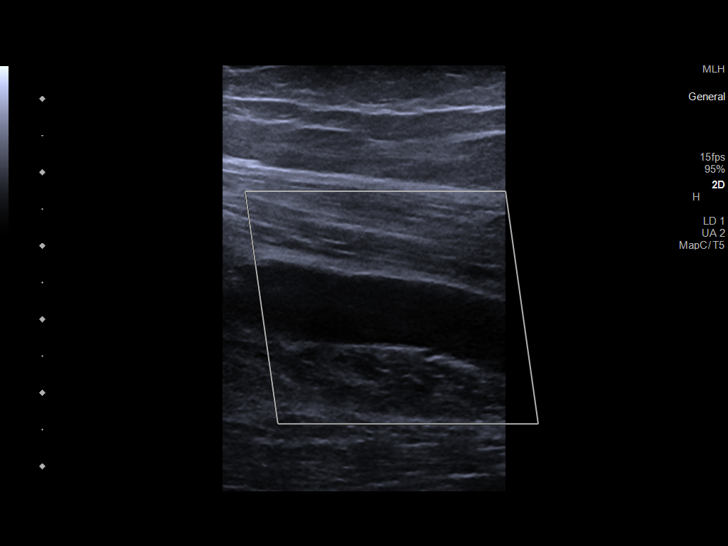
[im 19/44]
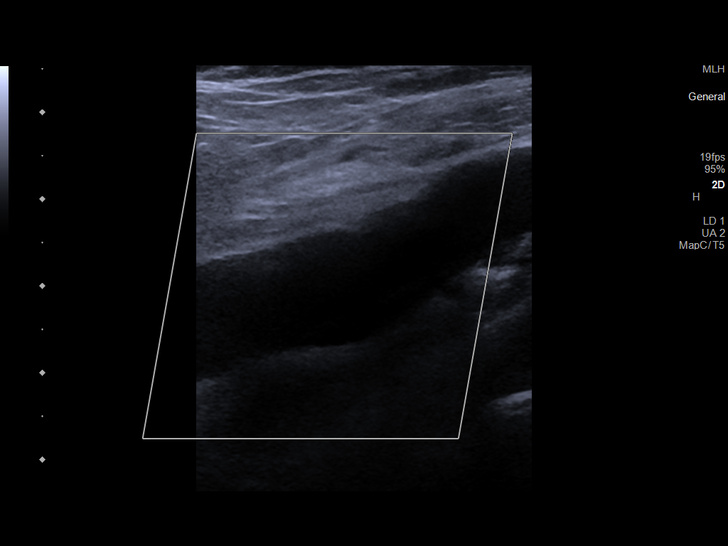
[im 23/44]
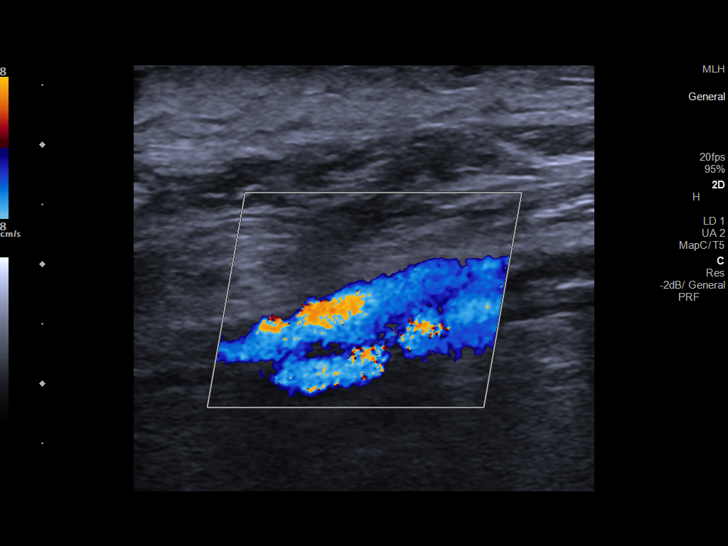
[im 25/44]
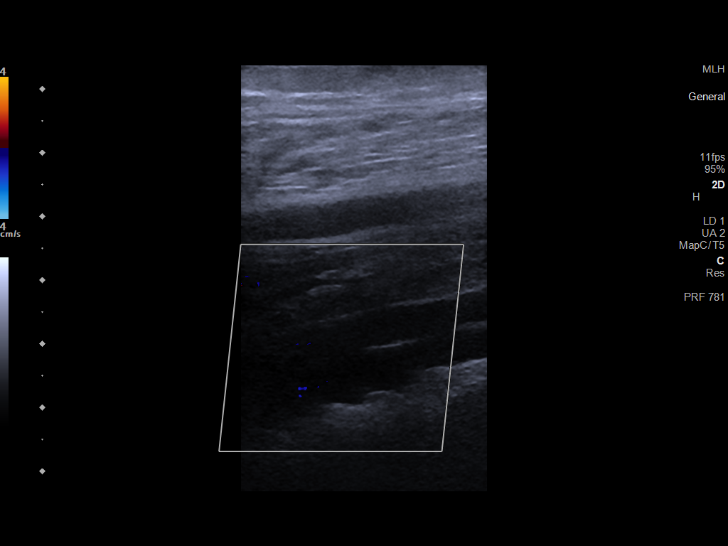
[im 29/44]
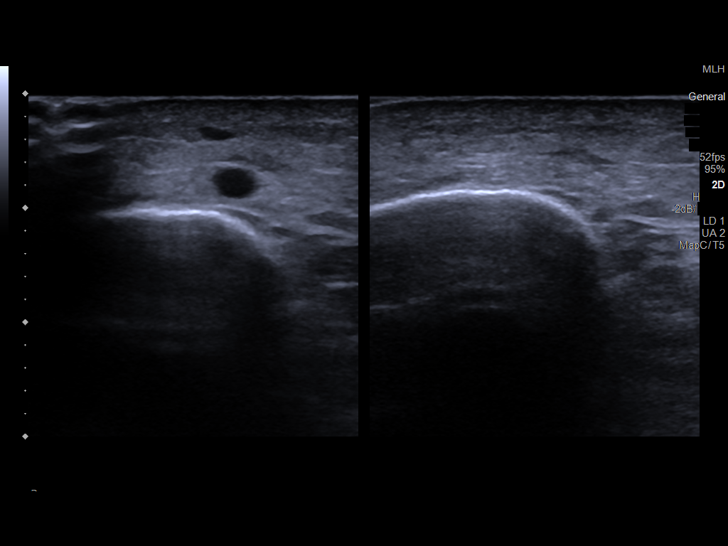
[im 32/44]
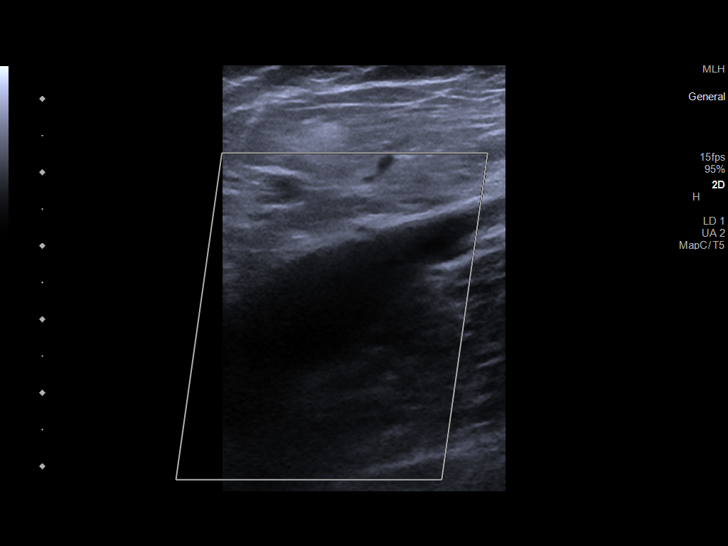
[im 36/44]
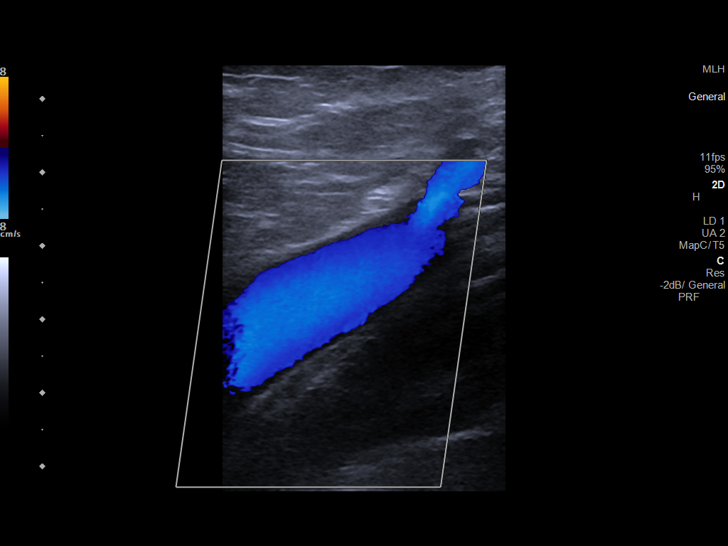
[im 40/44]
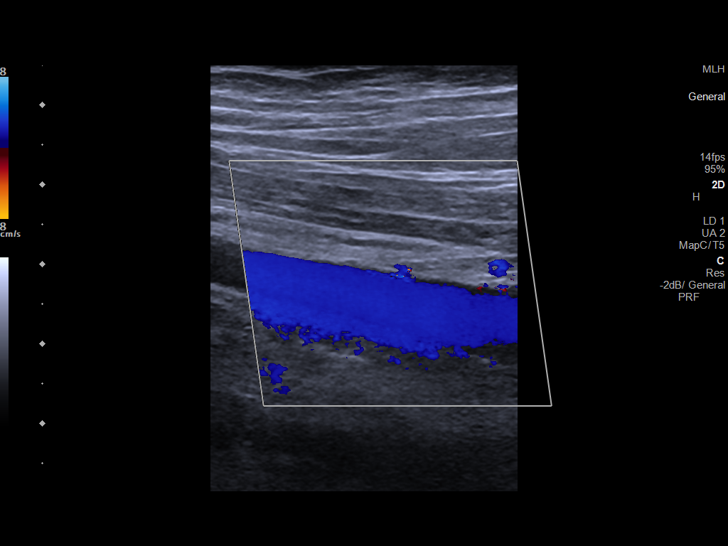
[im 44/44]
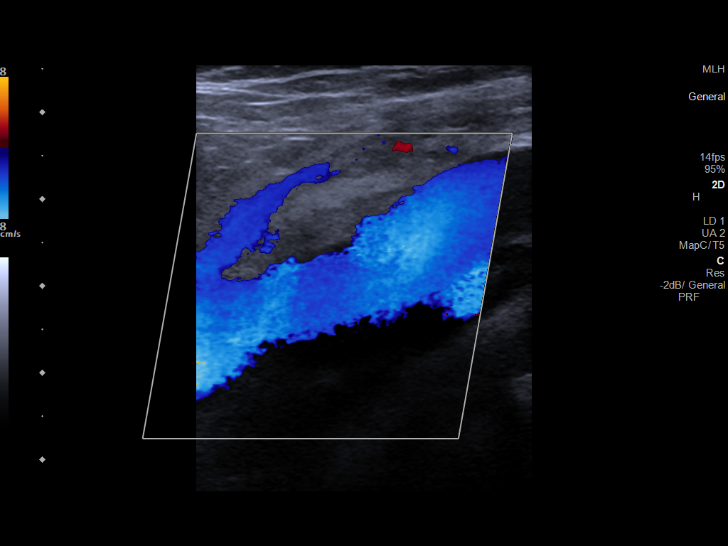

[13 of 24 positions shown; findings below may reference images not displayed]

FINDINGS: Contralateral Common Femoral Vein: Respiratory phasicity is normal
and symmetric with the symptomatic side. No evidence of thrombus.
Normal compressibility.

Common Femoral Vein: No evidence of thrombus. Normal
compressibility, respiratory phasicity and response to augmentation.

Saphenofemoral Junction: No evidence of thrombus. Normal
compressibility and flow on color Doppler imaging.

Profunda Femoral Vein: No evidence of thrombus. Normal
compressibility and flow on color Doppler imaging.

Femoral Vein: No evidence of thrombus. Normal compressibility,
respiratory phasicity and response to augmentation.

Popliteal Vein: No evidence of thrombus. Normal compressibility,
respiratory phasicity and response to augmentation.

Calf Veins: No evidence of thrombus. Normal compressibility and flow
on color Doppler imaging.
IMPRESSION: No evidence of deep venous thrombosis.

## 2021-10-22 ENCOUNTER — Other Ambulatory Visit: Payer: Self-pay | Admitting: Family Medicine

## 2021-10-22 ENCOUNTER — Other Ambulatory Visit: Payer: Self-pay | Admitting: Physician Assistant

## 2021-10-22 DIAGNOSIS — R7303 Prediabetes: Secondary | ICD-10-CM

## 2021-10-22 NOTE — Telephone Encounter (Signed)
Next Visit: 01/24/2022  Last Visit: 07/19/2021  Last Fill: 07/19/2021  DX: Idiopathic chronic gout of multiple sites without tophus   Current Dose per office note 07/19/2021: allopurinol 300 mg 1 tablet by mouth daily.  Labs: 07/19/2021 GFR is borderline low-58.  Rest of CMP WNL. CBC WNL. Uric Acid 3.3  Okay to refill Allopurinol?

## 2021-10-25 DIAGNOSIS — H35383 Toxic maculopathy, bilateral: Secondary | ICD-10-CM | POA: Diagnosis not present

## 2021-10-25 DIAGNOSIS — T378X5D Adverse effect of other specified systemic anti-infectives and antiparasitics, subsequent encounter: Secondary | ICD-10-CM | POA: Diagnosis not present

## 2021-10-25 DIAGNOSIS — H43813 Vitreous degeneration, bilateral: Secondary | ICD-10-CM | POA: Diagnosis not present

## 2021-11-07 ENCOUNTER — Ambulatory Visit (INDEPENDENT_AMBULATORY_CARE_PROVIDER_SITE_OTHER): Payer: Medicare Other

## 2021-11-07 DIAGNOSIS — Z0000 Encounter for general adult medical examination without abnormal findings: Secondary | ICD-10-CM

## 2021-11-07 NOTE — Progress Notes (Cosign Needed)
Subjective:   Dennis Moon is a 76 y.o. male who presents for an Initial Medicare Annual Wellness Visit.   I connected with Dennis Moon  today by telephone and verified that I am speaking with the correct person using two identifiers. Location patient: home Location provider: work Persons participating in the virtual visit: patient, provider.   I discussed the limitations, risks, security and privacy concerns of performing an evaluation and management service by telephone and the availability of in person appointments. I also discussed with the patient that there may be a patient responsible charge related to this service. The patient expressed understanding and verbally consented to this telephonic visit.    Interactive audio and video telecommunications were attempted between this provider and patient, however failed, due to patient having technical difficulties OR patient did not have access to video capability.  We continued and completed visit with audio only.    Review of Systems     Cardiac Risk Factors include: advanced age (>53mn, >>12women);male gender;hypertension;dyslipidemia     Objective:    Today's Vitals   There is no height or weight on file to calculate BMI.     11/07/2021    2:08 PM 03/29/2021   11:12 AM 11/02/2019    8:17 AM 12/12/2016    2:20 AM  Advanced Directives  Does Patient Have a Medical Advance Directive? Yes No Yes Yes  Type of [place]aramed[place]of [place] will  [place] will Living will  Does patient want to make changes to medical advance directive?    No - Patient declined  Copy of [place] Chart? No - copy requested  No - copy requested     Current Medications (verified) Outpatient Encounter Medications as of 11/07/2021  Medication Sig   albuterol (VENTOLIN HFA) 108 (90 Base) MCG/ACT inhaler INHALE 1 PUFF BY MOUTH INTO LUNGS EVERY 6 HOURS AS NEEDED FOR SHORTNESS  OF BREATH OR wheezing   allopurinol (ZYLOPRIM) 300 MG tablet TAKE ONE TABLET BY MOUTH EVERY EVENING   Ascorbic Acid (VITAMIN C) 1000 MG tablet Take 1,000 mg by mouth every morning.   aspirin-acetaminophen-caffeine (EXCEDRIN EXTRA STRENGTH) 250-250-65 MG tablet Take 2 tablets by mouth 4 (four) times daily as needed for headache or migraine (pain).   atorvastatin (LIPITOR) 80 MG tablet TAKE ONE TABLET BY MOUTH EVERY EVENING (Patient taking differently: Take 80 mg by mouth every evening.)   Calcium Carb-Cholecalciferol (CALCIUM 600 + D PO) Take 1 tablet by mouth every morning.   cholecalciferol (VITAMIN D) 25 MCG (1000 UNIT) tablet Take 1,000 Units by mouth every morning.   clobetasol cream (TEMOVATE) 0.05 % Apply to affected area twice daily as needed for up to 2 weeks.   CRANBERRY EXTRACT PO Take 4,200 mg by mouth 2 (two) times daily.   DULoxetine (CYMBALTA) 60 MG capsule TAKE ONE CAPSULE BY MOUTH EVERY EVENING   EPINEPHRINE 0.3 mg/0.3 mL IJ SOAJ injection USE AS DIRECTED (Patient taking differently: Inject 0.3 mg into the muscle as needed for anaphylaxis (severe allergic reaction).)   esomeprazole (NEXIUM) 20 MG capsule Take 1 capsule (20 mg total) by mouth 2 (two) times daily before a meal.   fluorometholone (FML) 0.1 % ophthalmic suspension Place 1 drop into both eyes daily as needed (dry eyes/irritation).   levocetirizine (XYZAL) 5 MG tablet Take 5 mg by mouth daily as needed (seasonal allergies).   levothyroxine (SYNTHROID) 112 MCG tablet TAKE ONE TABLET BY MOUTH EVERY EVENING  metFORMIN (GLUCOPHAGE-XR) 500 MG 24 hr tablet TAKE ONE TABLET BY MOUTH EVERY EVENING   Multiple Vitamin (MULTIVITAMIN WITH MINERALS) TABS tablet Take 1 tablet by mouth every morning. One a Day for Men over 50   Polyethyl Glycol-Propyl Glycol (SYSTANE) 0.4-0.3 % SOLN Place 1 drop into both eyes 2 (two) times daily as needed (dry eyes).   Probiotic Product (PROBIOTIC PO) Take 1 capsule by mouth every morning.    propranolol (INDERAL) 60 MG tablet TAKE ONE TABLET BY MOUTH EVERY MORNING and TAKE ONE TABLET BY MOUTH EVERY EVENING (Patient taking differently: Take 60 mg by mouth 2 (two) times daily.)   testosterone (ANDROGEL) 50 MG/5GM (1%) GEL apply FIVE grams (ONE PACKET) onto THE SKIN TWICE DAILY   Ubrogepant (UBRELVY) 50 MG TABS Take one at first sign of migraine. May repeat once in 2 hours. (Patient taking differently: Take 50 mg by mouth See admin instructions. Take one tablet (50 mg) by mouth at first sign of migraine; may repeat once in 2 hours if still needed)   zinc gluconate 50 MG tablet Take 50 mg by mouth every morning.   amLODipine (NORVASC) 5 MG tablet Take 1 tablet (5 mg total) by mouth daily.   No facility-administered encounter medications on file as of 11/07/2021.    Allergies (verified) Bee venom   History: Past Medical History:  Diagnosis Date   CAD S/P percutaneous coronary angioplasty 12/12/2016   a. 11/2016: NSTEMI with DES to LAD and DES to LCx; b. MV low risk 2021; c. NSTEMI 11/22, LHC patent stents to LAD/LCx, 40% mLAD, 20% mRCA, stable, med mgmt. Echo 11/22 EF 60-65%, GIDD, mild LAE.   CKD (chronic kidney disease), stage III (HCC)    GERD (gastroesophageal reflux disease)    Gout    Hyperlipidemia LDL goal <70    Hypertension    Hypothyroidism    Migraine    Osteoarthritis    Sjogren's disease ([place])    Systemic lupus erythematosus ([place])    Past Surgical History:  Procedure Laterality Date   CATARACT EXTRACTION, BILATERAL Bilateral 2017   COLONOSCOPY  2021   CORONARY STENT INTERVENTION N/A 12/12/2016   Procedure: Coronary Stent Intervention;  Surgeon: Moon, Dennis M, MD;  Location: [facility];  Service: Cardiovascular;  Laterality: N/A;   HERNIA REPAIR     LEFT HEART CATH AND CORONARY ANGIOGRAPHY N/A 12/12/2016   Procedure: Left Heart Cath and Coronary Angiography;  Surgeon: Moon, Dennis M, MD;  Location: [facility];  Service: Cardiovascular;   Laterality: N/A;   LEFT HEART CATH AND CORONARY ANGIOGRAPHY N/A 03/29/2021   Procedure: LEFT HEART CATH AND CORONARY ANGIOGRAPHY;  Surgeon: Dennis Blanks, MD;  Location: [facility];  Service: Cardiovascular;  Laterality: N/A;   THYROID SURGERY  1997   Family History  Problem Relation Age of Onset   Diabetes Mother    Hypertension Mother    Hypertension Father    Healthy Son    Social History   Socioeconomic History   Marital status: Married    Spouse name: Not on file   Number of children: Not on file   Years of education: Not on file   Highest education level: Not on file  Occupational History   Occupation: Dentist  Tobacco Use   Smoking status: Former    Packs/day: 1.00    Years: 6.00    Total pack years: 6.00    Types: Cigarettes    Quit date: 04/25/1966  Years since quitting: 55.5    Passive exposure: Past   Smokeless tobacco: Never  Vaping Use   Vaping Use: Never used  Substance and Sexual Activity   Alcohol use: Yes    Alcohol/week: 7.0 standard drinks of alcohol    Types: 7 Glasses of wine per week   Drug use: Never   Sexual activity: Not on file  Other Topics Concern   Not on file  Social History Narrative   Not on file   Social Determinants of Health   Financial Resource Strain: Low Risk  (11/07/2021)   Overall Financial Resource Strain (CARDIA)    Difficulty of Paying Living Expenses: Not hard at all  Food Insecurity: No Food Insecurity (11/07/2021)   Hunger Vital Sign    Worried About Running Out of Food in the Last Year: Never true    Ran Out of Food in the Last Year: Never true  Transportation Needs: No Transportation Needs (11/07/2021)   PRAPARE - Hydrologist (Medical): No    Lack of Transportation (Non-Medical): No  Physical Activity: Insufficiently Active (11/07/2021)   Exercise Vital Sign    Days of Exercise per Week: 3 days    Minutes of Exercise per Session: 30 min  Stress: No Stress Concern  Present (11/07/2021)   [place]    Feeling of Stress : Not at all  Social Connections: Moderately Integrated (11/07/2021)   Social Connection and Isolation Panel [NHANES]    Frequency of Communication with Friends and Family: Three times a week    Frequency of Social Gatherings with Friends and Family: Three times a week    Attends Religious Services: 1 to 4 times per year    Active Member of Clubs or Organizations: No    Attends Archivist Meetings: Never    Marital Status: Married    Tobacco Counseling Counseling given: Not Answered   Clinical Intake:  Pre-visit preparation completed: Yes  Pain : No/denies pain     Nutritional Risks: None Diabetes: No  How often do you need to have someone help you when you read instructions, pamphlets, or other written materials from your doctor or pharmacy?: 1 - Never What is the last grade level you completed in school?: college  Diabetic?no   Interpreter Needed?: No  Information entered by :: [place] of Daily Living    11/07/2021    2:09 PM  In your present state of health, do you have any difficulty performing the following activities:  Hearing? 0  Vision? 0  Difficulty concentrating or making decisions? 0  Walking or climbing stairs? 0  Dressing or bathing? 0  Doing errands, shopping? 0  Preparing Food and eating ? N  Using the Toilet? N  In the past six months, have you accidently leaked urine? N  Do you have problems with loss of bowel control? N  Managing your Medications? N  Managing your Finances? N  Housekeeping or managing your Housekeeping? N    Patient Care Team: [name], MD as PCP - General (Family Medicine) Moon, Dennis M, MD as PCP - Cardiology (Cardiology) Germaine Pomfret, [facility] as Pharmacist (Pharmacist) Bo Merino, MD as Consulting Physician (Rheumatology)  Indicate any recent  Medical Services you may have received from other than [facility] providers in the past year (date may be approximate).     Assessment:   This is a routine wellness examination for Dennis Moon.  Hearing/Vision screen Vision Screening - Comments:: Annual eye exams wear glasses   Dietary issues and exercise activities discussed: Current Exercise Habits: Home exercise routine, Type of exercise: walking, Time (Minutes): 30, Frequency (Times/Week): 3, Weekly Exercise (Minutes/Week): 90, Intensity: Mild, Exercise limited by: orthopedic condition(s)   Goals Addressed             This Visit's Progress    Patient Stated   On track    Cut out soft drinks, Healthier diet, increase excercise       Depression Screen    11/07/2021    2:08 PM 11/07/2021    2:06 PM 07/26/2021   10:08 AM 04/26/2021   11:51 AM 12/24/2020   10:41 AM 11/06/2020    1:09 PM 09/07/2020   10:06 AM  PHQ 2/9 Scores  PHQ - 2 Score 0 0 0 0 0 0 0    Fall Risk    11/07/2021    2:09 PM 07/26/2021   10:09 AM 04/26/2021   11:51 AM 12/24/2020   10:41 AM 11/06/2020   12:57 PM  Fall Risk   Falls in the past year? 0 0 0 0 1  Number falls in past yr: 0 0 0  0  Injury with Fall? 0 0   1  Comment     fell out of lawn chair.  sprain in neck no visit  Follow up Falls evaluation completed    Falls evaluation completed;Falls prevention discussed    FALL RISK PREVENTION PERTAINING TO THE HOME:  Any stairs in or around the home? Yes  If so, are there any without handrails? No  Home free of loose throw rugs in walkways, pet beds, electrical cords, etc? Yes  Adequate lighting in your home to reduce risk of falls? Yes   ASSISTIVE DEVICES UTILIZED TO PREVENT FALLS:  Life alert? No  Use of a cane, walker or w/c? No  Grab bars in the bathroom? Yes  Shower chair or bench in shower? Yes  Elevated toilet seat or a handicapped toilet? Yes     Cognitive Function:    Normal cognitive status assessed by telephone conversation  by this Nurse  Health Advisor. No abnormalities found.      Immunizations Immunization History  Administered Date(s) Administered   Influenza Whole 02/25/2021   Influenza-Unspecified 02/24/2018, 02/04/2019, 02/16/2020   PFIZER(Purple Top)SARS-COV-2 Vaccination 06/17/2019, 07/08/2019, 02/16/2020   Pneumococcal Conjugate-13 10/21/2013   Pneumococcal Polysaccharide-23 08/02/2008, 03/14/2016   Tdap 07/18/2019   Zoster Recombinat (Shingrix) 11/12/2017   Zoster, Live 08/28/2006    TDAP status: Up to date  Flu Vaccine status: Up to date  Pneumococcal vaccine status: Up to date  Covid-19 vaccine status: Completed vaccines  Qualifies for Shingles Vaccine? Yes   Zostavax completed No   Shingrix Completed?: No.    Education has been provided regarding the importance of this vaccine. Patient has been advised to call insurance company to determine out of pocket expense if they have not yet received this vaccine. Advised may also receive vaccine at local pharmacy or Health Dept. Verbalized acceptance and understanding.  Screening Tests Health Maintenance  Topic Date Due   Zoster Vaccines- Shingrix (2 of 2) 01/07/2018   INFLUENZA VACCINE  12/17/2021   TETANUS/TDAP  07/17/2029   Pneumonia Vaccine 73+ Years old  Completed   Hepatitis C Screening  Completed   HPV VACCINES  Aged Out   COLONOSCOPY (Pts 45-34yr Insurance coverage will need to be confirmed)  Discontinued   COVID-19 Vaccine  Discontinued  Health Maintenance  Health Maintenance Due  Topic Date Due   Zoster Vaccines- Shingrix (2 of 2) 01/07/2018    Colorectal cancer screening: No longer required.   Lung Cancer Screening: (Low Dose CT Chest recommended if Age 63-80 years, 30 pack-year currently smoking OR have quit w/in 15years.) does not qualify.   Lung Cancer Screening Referral: n/a  Additional Screening:  Hepatitis C Screening: does not qualify;   Vision Screening: Recommended annual ophthalmology exams for early detection of  glaucoma and other disorders of the eye. Is the patient up to date with their annual eye exam?  Yes  Who is the provider or what is the name of the office in which the patient attends annual eye exams? Dr.Gilliam  If pt is not established with a provider, would they like to be referred to a provider to establish care? No .   Dental Screening: Recommended annual dental exams for proper oral hygiene  Community Resource Referral / Chronic Care Management: CRR required this visit?  No   CCM required this visit?  No      Plan:     I have personally reviewed and noted the following in the patient's chart:   Medical and social history Use of alcohol, tobacco or illicit drugs  Current medications and supplements including opioid prescriptions. Patient is not currently taking opioid prescriptions. Functional ability and status Nutritional status Physical activity Advanced directives List of other physicians Hospitalizations, surgeries, and ER visits in previous 12 months Vitals Screenings to include cognitive, depression, and falls Referrals and appointments  In addition, I have reviewed and discussed with patient certain preventive protocols, quality metrics, and best practice recommendations. A written personalized care plan for preventive services as well as general preventive health recommendations were provided to patient.     Randel Pigg, LPN   [id number]/50/[id number]   Nurse Notes: none

## 2021-11-07 NOTE — Patient Instructions (Signed)
Dennis Moon , Thank you for taking time to come for your Medicare Wellness Visit. I appreciate your ongoing commitment to your health goals. Please review the following plan we discussed and let me know if I can assist you in the future.   Screening recommendations/referrals: Colonoscopy: no longer required  Recommended yearly ophthalmology/optometry visit for glaucoma screening and checkup Recommended yearly dental visit for hygiene and checkup  Vaccinations: Influenza vaccine: completed  Pneumococcal vaccine: completed  Tdap vaccine: 07/18/2019 Shingles vaccine: completed   Advanced directives: yes   Conditions/risks identified: none   Next appointment: none   Preventive Care 77 Years and Older, Male Preventive care refers to lifestyle choices and visits with your health care provider that can promote health and wellness. What does preventive care include? A yearly physical exam. This is also called an annual well check. Dental exams once or twice a year. Routine eye exams. Ask your health care provider how often you should have your eyes checked. Personal lifestyle choices, including: Daily care of your teeth and gums. Regular physical activity. Eating a healthy diet. Avoiding tobacco and drug use. Limiting alcohol use. Practicing safe sex. Taking low doses of aspirin every day. Taking vitamin and mineral supplements as recommended by your health care provider. What happens during an annual well check? The services and screenings done by your health care provider during your annual well check will depend on your age, overall health, lifestyle risk factors, and family history of disease. Counseling  Your health care provider may ask you questions about your: Alcohol use. Tobacco use. Drug use. Emotional well-being. Home and relationship well-being. Sexual activity. Eating habits. History of falls. Memory and ability to understand (cognition). Work and work  Statistician. Screening  You may have the following tests or measurements: Height, weight, and BMI. Blood pressure. Lipid and cholesterol levels. These may be checked every 5 years, or more frequently if you are over 77 years old. Skin check. Lung cancer screening. You may have this screening every year starting at age 77 if you have a 30-pack-year history of smoking and currently smoke or have quit within the past 15 years. Fecal occult blood test (FOBT) of the stool. You may have this test every year starting at age 77. Flexible sigmoidoscopy or colonoscopy. You may have a sigmoidoscopy every 5 years or a colonoscopy every 10 years starting at age 77. Prostate cancer screening. Recommendations will vary depending on your family history and other risks. Hepatitis C blood test. Hepatitis B blood test. Sexually transmitted disease (STD) testing. Diabetes screening. This is done by checking your blood sugar (glucose) after you have not eaten for a while (fasting). You may have this done every 1-3 years. Abdominal aortic aneurysm (AAA) screening. You may need this if you are a current or former smoker. Osteoporosis. You may be screened starting at age 77 if you are at high risk. Talk with your health care provider about your test results, treatment options, and if necessary, the need for more tests. Vaccines  Your health care provider may recommend certain vaccines, such as: Influenza vaccine. This is recommended every year. Tetanus, diphtheria, and acellular pertussis (Tdap, Td) vaccine. You may need a Td booster every 10 years. Zoster vaccine. You may need this after age 77. Pneumococcal 13-valent conjugate (PCV13) vaccine. One dose is recommended after age 77. Pneumococcal polysaccharide (PPSV23) vaccine. One dose is recommended after age 77. Talk to your health care provider about which screenings and vaccines you need and how often you  need them. This information is not intended to replace  advice given to you by your health care provider. Make sure you discuss any questions you have with your health care provider. Document Released: 06/01/2015 Document Revised: 01/23/2016 Document Reviewed: 03/06/2015 Elsevier Interactive Patient Education  2017 [place] Prevention in the Home Falls can cause injuries. They can happen to people of all ages. There are many things you can do to make your home safe and to help prevent falls. What can I do on the outside of my home? Regularly fix the edges of walkways and driveways and fix any cracks. Remove anything that might make you trip as you walk through a door, such as a raised step or threshold. Trim any bushes or trees on the path to your home. Use bright outdoor lighting. Clear any walking paths of anything that might make someone trip, such as rocks or tools. Regularly check to see if handrails are loose or broken. Make sure that both sides of any steps have handrails. Any raised decks and porches should have guardrails on the edges. Have any leaves, snow, or ice cleared regularly. Use sand or salt on walking paths during winter. Clean up any spills in your garage right away. This includes oil or grease spills. What can I do in the bathroom? Use night lights. Install grab bars by the toilet and in the tub and shower. Do not use towel bars as grab bars. Use non-skid mats or decals in the tub or shower. If you need to sit down in the shower, use a plastic, non-slip stool. Keep the floor dry. Clean up any water that spills on the floor as soon as it happens. Remove soap buildup in the tub or shower regularly. Attach bath mats securely with double-sided non-slip rug tape. Do not have throw rugs and other things on the floor that can make you trip. What can I do in the bedroom? Use night lights. Make sure that you have a light by your bed that is easy to reach. Do not use any sheets or blankets that are too big for your bed.  They should not hang down onto the floor. Have a firm chair that has side arms. You can use this for support while you get dressed. Do not have throw rugs and other things on the floor that can make you trip. What can I do in the kitchen? Clean up any spills right away. Avoid walking on wet floors. Keep items that you use a lot in easy-to-reach places. If you need to reach something above you, use a strong step stool that has a grab bar. Keep electrical cords out of the way. Do not use floor polish or wax that makes floors slippery. If you must use wax, use non-skid floor wax. Do not have throw rugs and other things on the floor that can make you trip. What can I do with my stairs? Do not leave any items on the stairs. Make sure that there are handrails on both sides of the stairs and use them. Fix handrails that are broken or loose. Make sure that handrails are as long as the stairways. Check any carpeting to make sure that it is firmly attached to the stairs. Fix any carpet that is loose or worn. Avoid having throw rugs at the top or bottom of the stairs. If you do have throw rugs, attach them to the floor with carpet tape. Make sure that you have a light switch  at the top of the stairs and the bottom of the stairs. If you do not have them, ask someone to add them for you. What else can I do to help prevent falls? Wear shoes that: Do not have high heels. Have rubber bottoms. Are comfortable and fit you well. Are closed at the toe. Do not wear sandals. If you use a stepladder: Make sure that it is fully opened. Do not climb a closed stepladder. Make sure that both sides of the stepladder are locked into place. Ask someone to hold it for you, if possible. Clearly mark and make sure that you can see: Any grab bars or handrails. First and last steps. Where the edge of each step is. Use tools that help you move around (mobility aids) if they are needed. These  include: Canes. Walkers. Scooters. Crutches. Turn on the lights when you go into a dark area. Replace any light bulbs as soon as they burn out. Set up your furniture so you have a clear path. Avoid moving your furniture around. If any of your floors are uneven, fix them. If there are any pets around you, be aware of where they are. Review your medicines with your doctor. Some medicines can make you feel dizzy. This can increase your chance of falling. Ask your doctor what other things that you can do to help prevent falls. This information is not intended to replace advice given to you by your health care provider. Make sure you discuss any questions you have with your health care provider. Document Released: 03/01/2009 Document Revised: 10/11/2015 Document Reviewed: 06/09/2014 Elsevier Interactive Patient Education  2017 [employer].

## 2021-11-11 ENCOUNTER — Other Ambulatory Visit: Payer: Self-pay | Admitting: Cardiology

## 2021-11-14 ENCOUNTER — Other Ambulatory Visit: Payer: Self-pay | Admitting: Family Medicine

## 2021-11-14 DIAGNOSIS — R519 Headache, unspecified: Secondary | ICD-10-CM

## 2021-12-02 ENCOUNTER — Other Ambulatory Visit: Payer: Self-pay | Admitting: Physician Assistant

## 2021-12-02 NOTE — Telephone Encounter (Signed)
Next Visit: 01/24/2022   Last Visit: 07/19/2021   Last Fill: 08/26/2021  Okay to refill Clobetasol  Cream?

## 2022-01-09 ENCOUNTER — Other Ambulatory Visit: Payer: Self-pay | Admitting: Family Medicine

## 2022-01-09 DIAGNOSIS — E291 Testicular hypofunction: Secondary | ICD-10-CM

## 2022-01-10 NOTE — Progress Notes (Signed)
Office Visit Note  Patient: Dennis Moon             Date of Birth: Jan 27, 1946           MRN: [id number]             PCP: [name], MD Referring: [name],* Visit Date: 01/24/2022 Occupation: '@GUAROCC'$ @  Subjective:  Medication management  History of Present Illness: Dennis Moon is a 76 y.o. male with history of Sjogren's, subcutaneous lupus, gout, osteoarthritis and degenerative disc disease.  He states he continues to have dry mouth and dry eye symptoms.  His dry eyes has been more bothersome.  He has been using over-the-counter products.  He states that he noticed a rash on his left arm at one time which responded to topical clobetasol cream.  He had no gout flares.  He continues to have lower back pain due to underlying degenerative disc disease.  Activities of Daily Living:  Patient reports morning stiffness for a few minutes.   Patient Reports nocturnal pain.  Difficulty dressing/grooming: Denies Difficulty climbing stairs: Denies Difficulty getting out of chair: Denies Difficulty using hands for taps, buttons, cutlery, and/or writing: Denies  Review of Systems  Constitutional:  Positive for fatigue.  HENT:  Positive for mouth dryness. Negative for mouth sores.   Eyes:  Positive for dryness.  Respiratory:  Negative for shortness of breath.   Cardiovascular:  Negative for chest pain and palpitations.  Gastrointestinal:  Negative for blood in stool, constipation and diarrhea.  Endocrine: Negative for increased urination.  Genitourinary:  Negative for involuntary urination.  Musculoskeletal:  Positive for joint pain, joint pain, muscle weakness and morning stiffness. Negative for gait problem, joint swelling, myalgias, muscle tenderness and myalgias.  Skin:  Positive for rash and sensitivity to sunlight. Negative for color change.  Allergic/Immunologic: Negative for susceptible to infections.  Neurological:  Positive for headaches. Negative  for dizziness.  Hematological:  Negative for swollen glands.  Psychiatric/Behavioral:  Negative for depressed mood and sleep disturbance. The patient is not nervous/anxious.     PMFS History:  Patient Active Problem List   Diagnosis Date Noted   Mild reactive airways disease 07/26/2021   Hospital discharge follow-up 04/26/2021   Prediabetes 04/26/2021   Unstable angina ([place]) 03/29/2021   Chest pain 03/29/2021   Gastroesophageal reflux disease 09/07/2020   Allergy to honey bee venom 09/07/2020   History of gout 12/29/2019   Elevated cholesterol 12/29/2019   Healthcare maintenance 12/29/2019   Nonintractable headache 06/24/2019   Androgen deficiency 11/17/2017   Hypothyroidism 11/17/2017   Cyst of tonsil 06/11/2017   Obesity (BMI 30.0-34.9) 03/12/2017   CAD S/P percutaneous coronary angioplasty 12/13/2016   NSTEMI (non-ST elevated myocardial infarction) ([place]) 12/11/2016   Gout of multiple sites 08/26/2016   Chronic renal impairment 08/26/2016   Chronic kidney disease 08/26/2016   Sjogren's syndrome ([place]) 04/23/2016   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   Idiopathic chronic gout, unspecified site, without tophus (tophi) 04/23/2016   High risk medication use 04/23/2016   Primary osteoarthritis of both hands 04/23/2016   DDD (degenerative disc disease), lumbar 04/23/2016   Kidney disease 04/23/2016    Past Medical History:  Diagnosis Date   CAD S/P percutaneous coronary angioplasty 12/12/2016   a. 11/2016: NSTEMI with DES to LAD and DES to LCx; b. MV low risk 2021; c. NSTEMI 11/22, LHC patent stents to LAD/LCx, 40% mLAD, 20% mRCA, stable, med mgmt. Echo 11/22 EF 60-65%, GIDD, mild LAE.   CKD (  chronic kidney disease), stage III (HCC)    GERD (gastroesophageal reflux disease)    Gout    Hyperlipidemia LDL goal <70    Hypertension    Hypothyroidism    Migraine    Osteoarthritis    Sjogren's disease ([place])    Systemic lupus erythematosus ([place])     Family History  Problem Relation Age  of Onset   Diabetes Mother    Hypertension Mother    Hypertension Father    Healthy Son    Past Surgical History:  Procedure Laterality Date   CATARACT EXTRACTION, BILATERAL Bilateral 2017   COLONOSCOPY  2021   CORONARY STENT INTERVENTION N/A 12/12/2016   Procedure: Coronary Stent Intervention;  Surgeon: Martinique, Peter M, MD;  Location: [facility];  Service: Cardiovascular;  Laterality: N/A;   HERNIA REPAIR     LEFT HEART CATH AND CORONARY ANGIOGRAPHY N/A 12/12/2016   Procedure: Left Heart Cath and Coronary Angiography;  Surgeon: Martinique, Peter M, MD;  Location: [facility];  Service: Cardiovascular;  Laterality: N/A;   LEFT HEART CATH AND CORONARY ANGIOGRAPHY N/A 03/29/2021   Procedure: LEFT HEART CATH AND CORONARY ANGIOGRAPHY;  Surgeon: Burnell Blanks, MD;  Location: [facility];  Service: Cardiovascular;  Laterality: N/A;   THYROID SURGERY  1997   Social History   Social History Narrative   Not on file   Immunization History  Administered Date(s) Administered   Influenza Whole 02/25/2021   Influenza-Unspecified 02/24/2018, 02/04/2019, 02/16/2020   PFIZER(Purple Top)SARS-COV-2 Vaccination 06/17/2019, 07/08/2019, 02/16/2020   Pneumococcal Conjugate-13 10/21/2013   Pneumococcal Polysaccharide-23 08/02/2008, 03/14/2016   Tdap 07/18/2019   Zoster Recombinat (Shingrix) 11/12/2017   Zoster, Live 08/28/2006     Objective: Vital Signs: BP 131/80 (BP Location: Left Arm, Patient Position: Sitting, Cuff Size: Normal)   Pulse 80   Resp 17   Ht 6' (1.829 m)   Wt 237 lb (107.5 kg)   BMI 32.14 kg/m    Physical Exam Vitals and nursing note reviewed.  Constitutional:      Appearance: He is well-developed.  HENT:     Head: Normocephalic and atraumatic.  Eyes:     Conjunctiva/sclera: Conjunctivae normal.     Pupils: Pupils are equal, round, and reactive to light.  Cardiovascular:     Rate and Rhythm: Normal rate and regular rhythm.     Heart sounds:  Normal heart sounds.  Pulmonary:     Effort: Pulmonary effort is normal.     Breath sounds: Normal breath sounds.  Abdominal:     General: Bowel sounds are normal.     Palpations: Abdomen is soft.  Musculoskeletal:     Cervical back: Normal range of motion and neck supple.  Skin:    General: Skin is warm and dry.     Capillary Refill: Capillary refill takes less than 2 seconds.  Neurological:     Mental Status: He is alert and oriented to person, place, and time.  Psychiatric:        Behavior: Behavior normal.      Musculoskeletal Exam: He had limitation with lateral rotation of the cervical spine.  He had painful limited range of motion of the lumbar spine.  Shoulder joints, elbow joints, wrist joints with good range of motion.  He had bilateral PIP and DIP thickening.  Hip joints and knee joints with good range of motion.  There was no tenderness over ankles or MTPs.  CDAI Exam: CDAI Score: -- Patient Global: --; Provider  Global: -- Swollen: --; Tender: -- Joint Exam 01/24/2022   No joint exam has been documented for this visit   There is currently no information documented on the homunculus. Go to the Rheumatology activity and complete the homunculus joint exam.  Investigation: No additional findings.  Imaging: No results found.  Recent Labs: Lab Results  Component Value Date   WBC 5.7 07/19/2021   HGB 15.3 07/19/2021   PLT 219 07/19/2021   NA 143 07/19/2021   K 4.6 07/19/2021   CL 101 07/19/2021   CO2 32 07/19/2021   GLUCOSE 100 (H) 07/19/2021   BUN 21 07/19/2021   CREATININE 1.28 07/19/2021   BILITOT 0.5 07/19/2021   ALKPHOS 86 03/29/2021   AST 25 07/19/2021   ALT 26 07/19/2021   PROT 7.7 07/19/2021   PROT 7.5 07/19/2021   ALBUMIN 3.3 (L) 03/29/2021   CALCIUM 9.4 07/19/2021   GFRAA 59 (L) 07/01/2019    Speciality Comments: PLQ eye exam normal on 05/07/18  @ [facility].  Procedures:  No procedures performed Allergies: Bee venom    Assessment / Plan:     Visit Diagnoses: Sjogren's syndrome with other organ involvement ([place]) - Positive ANA, positive Ro, positive La:  -He continues to have dry mouth and dry eyes.  Over-the-counter products were discussed at length.  I will obtain following labs today.  Plan: Anti-DNA antibody, double-stranded, C3 and C4, Sedimentation rate, Protein / creatinine ratio, urine  Subacute cutaneous lupus erythematosus-he had 1 episode of rash on his left arm which responded to topical vitals all.  No active rash was noted today.  High risk medication use - d/c PLQ due to abnormal eye exam 11/2018. - Plan: CBC with Differential/Platelet, COMPLETE METABOLIC PANEL WITH GFR  Idiopathic chronic gout of multiple sites without tophus -he denies having a gout flare.  He is on allopurinol 300 mg 1 tablet by mouth daily. uric acid: 4.3 on 07/19/2021. - Plan: Uric acid  Primary osteoarthritis of both hands-he has bilateral PIP and DIP thickening.  Joint protection muscle strengthening was discussed.  DDD (degenerative disc disease), lumbar-he continues to have lower back pain and some numbness in his left foot.  Core strengthening exercises were discussed.  CAD S/P percutaneous coronary angioplasty  History of non-ST elevation myocardial infarction (NSTEMI) - He underwent a left heart catheterization on 03/29/2021.  History of chronic kidney disease - He is followed by nephrologist.   Orders: Orders Placed This Encounter  Procedures   CBC with Differential/Platelet   COMPLETE METABOLIC PANEL WITH GFR   Anti-DNA antibody, double-stranded   C3 and C4   Sedimentation rate   Uric acid   Protein / creatinine ratio, urine   No orders of the defined types were placed in this encounter.    Follow-Up Instructions: Return in about 5 months (around 06/26/2022) for Sjogren's, Osteoarthritis, Gout.   Bo Merino, MD  Note - This record has been created using Editor, commissioning.  Chart creation errors  have been sought, but may not always  have been located. Such creation errors do not reflect on  the standard of medical care.

## 2022-01-22 ENCOUNTER — Other Ambulatory Visit: Payer: Self-pay | Admitting: Physician Assistant

## 2022-01-22 NOTE — Telephone Encounter (Signed)
Next Visit: 01/24/2022  Last Visit: 07/19/2021  Last Fill: 10/22/2021  DX: Idiopathic chronic gout of multiple sites without tophus   Current Dose per office note 07/19/2021: allopurinol 300 mg 1 tablet by mouth daily.  Labs: 07/19/2021 GFR is borderline low-58.  Rest of CMP WNL. CBC WNL. Uric acid within the desirable range.   Patient to update labs in office on 01/24/2022  Okay to refill Allopurinol?

## 2022-01-24 ENCOUNTER — Encounter: Payer: Self-pay | Admitting: Rheumatology

## 2022-01-24 ENCOUNTER — Ambulatory Visit: Payer: Medicare Other | Attending: Rheumatology | Admitting: Rheumatology

## 2022-01-24 VITALS — BP 131/80 | HR 80 | Resp 17 | Ht 72.0 in | Wt 237.0 lb

## 2022-01-24 DIAGNOSIS — I252 Old myocardial infarction: Secondary | ICD-10-CM

## 2022-01-24 DIAGNOSIS — Z9861 Coronary angioplasty status: Secondary | ICD-10-CM | POA: Diagnosis not present

## 2022-01-24 DIAGNOSIS — M1A09X0 Idiopathic chronic gout, multiple sites, without tophus (tophi): Secondary | ICD-10-CM

## 2022-01-24 DIAGNOSIS — M3509 Sicca syndrome with other organ involvement: Secondary | ICD-10-CM

## 2022-01-24 DIAGNOSIS — I2510 Atherosclerotic heart disease of native coronary artery without angina pectoris: Secondary | ICD-10-CM | POA: Diagnosis not present

## 2022-01-24 DIAGNOSIS — Z79899 Other long term (current) drug therapy: Secondary | ICD-10-CM | POA: Diagnosis not present

## 2022-01-24 DIAGNOSIS — L931 Subacute cutaneous lupus erythematosus: Secondary | ICD-10-CM

## 2022-01-24 DIAGNOSIS — M19042 Primary osteoarthritis, left hand: Secondary | ICD-10-CM | POA: Diagnosis not present

## 2022-01-24 DIAGNOSIS — M19041 Primary osteoarthritis, right hand: Secondary | ICD-10-CM

## 2022-01-24 DIAGNOSIS — Z87448 Personal history of other diseases of urinary system: Secondary | ICD-10-CM | POA: Diagnosis not present

## 2022-01-24 DIAGNOSIS — M5136 Other intervertebral disc degeneration, lumbar region: Secondary | ICD-10-CM | POA: Diagnosis not present

## 2022-01-28 LAB — CBC WITH DIFFERENTIAL/PLATELET
Absolute Monocytes: 670 cells/uL (ref 200–950)
Basophils Absolute: 27 cells/uL (ref 0–200)
Basophils Relative: 0.4 %
Eosinophils Absolute: 188 cells/uL (ref 15–500)
Eosinophils Relative: 2.8 %
HCT: 49 % (ref 38.5–50.0)
Hemoglobin: 16.4 g/dL (ref 13.2–17.1)
Lymphs Abs: 951 cells/uL (ref 850–3900)
MCH: 31.3 pg (ref 27.0–33.0)
MCHC: 33.5 g/dL (ref 32.0–36.0)
MCV: 93.5 fL (ref 80.0–100.0)
MPV: 10.8 fL (ref 7.5–12.5)
Monocytes Relative: 10 %
Neutro Abs: 4864 cells/uL (ref 1500–7800)
Neutrophils Relative %: 72.6 %
Platelets: 207 10*3/uL (ref 140–400)
RBC: 5.24 10*6/uL (ref 4.20–5.80)
RDW: 13.8 % (ref 11.0–15.0)
Total Lymphocyte: 14.2 %
WBC: 6.7 10*3/uL (ref 3.8–10.8)

## 2022-01-28 LAB — C3 AND C4
C3 Complement: 166 mg/dL (ref 82–185)
C4 Complement: 30 mg/dL (ref 15–53)

## 2022-01-28 LAB — COMPLETE METABOLIC PANEL WITH GFR
AG Ratio: 1.3 (calc) (ref 1.0–2.5)
ALT: 22 U/L (ref 9–46)
AST: 24 U/L (ref 10–35)
Albumin: 4.3 g/dL (ref 3.6–5.1)
Alkaline phosphatase (APISO): 98 U/L (ref 35–144)
BUN: 25 mg/dL (ref 7–25)
CO2: 33 mmol/L — ABNORMAL HIGH (ref 20–32)
Calcium: 9.7 mg/dL (ref 8.6–10.3)
Chloride: 100 mmol/L (ref 98–110)
Creat: 1.26 mg/dL (ref 0.70–1.28)
Globulin: 3.4 g/dL (calc) (ref 1.9–3.7)
Glucose, Bld: 95 mg/dL (ref 65–99)
Potassium: 4.6 mmol/L (ref 3.5–5.3)
Sodium: 142 mmol/L (ref 135–146)
Total Bilirubin: 0.6 mg/dL (ref 0.2–1.2)
Total Protein: 7.7 g/dL (ref 6.1–8.1)
eGFR: 59 mL/min/{1.73_m2} — ABNORMAL LOW (ref >=60)

## 2022-01-28 LAB — PROTEIN / CREATININE RATIO, URINE
Creatinine, Urine: 119 mg/dL (ref 20–320)
Protein/Creat Ratio: 269 mg/g creat — ABNORMAL HIGH (ref 25–148)
Protein/Creatinine Ratio: 0.269 mg/mg creat — ABNORMAL HIGH (ref 0.025–0.148)
Total Protein, Urine: 32 mg/dL — ABNORMAL HIGH (ref 5–25)

## 2022-01-28 LAB — SEDIMENTATION RATE: Sed Rate: 6 mm/h (ref 0–20)

## 2022-01-28 LAB — ANTI-DNA ANTIBODY, DOUBLE-STRANDED: ds DNA Ab: 1 IU/mL

## 2022-01-28 LAB — URIC ACID: Uric Acid, Serum: 4.3 mg/dL (ref 4.0–8.0)

## 2022-01-28 NOTE — Progress Notes (Signed)
GFR is low and stable.  Protein creatinine is mildly elevated.  CBC is normal.  Double-stranded DNA negative.  Complements normal.  Sed rate normal.  Labs do not indicate active autoimmune disease.  Uric acid is in desirable range.  Please notify patient and forward results to his nephrologist.

## 2022-01-31 ENCOUNTER — Ambulatory Visit: Payer: Medicare Other | Admitting: Family Medicine

## 2022-02-07 ENCOUNTER — Ambulatory Visit (INDEPENDENT_AMBULATORY_CARE_PROVIDER_SITE_OTHER): Payer: Medicare Other | Admitting: Family Medicine

## 2022-02-07 ENCOUNTER — Ambulatory Visit: Payer: Medicare Other | Admitting: Family Medicine

## 2022-02-07 ENCOUNTER — Encounter: Payer: Self-pay | Admitting: Family Medicine

## 2022-02-07 VITALS — BP 122/68 | HR 66 | Temp 97.4°F | Ht 72.0 in | Wt 239.4 lb

## 2022-02-07 DIAGNOSIS — E039 Hypothyroidism, unspecified: Secondary | ICD-10-CM

## 2022-02-07 DIAGNOSIS — M329 Systemic lupus erythematosus, unspecified: Secondary | ICD-10-CM | POA: Diagnosis not present

## 2022-02-07 DIAGNOSIS — B001 Herpesviral vesicular dermatitis: Secondary | ICD-10-CM

## 2022-02-07 DIAGNOSIS — R7303 Prediabetes: Secondary | ICD-10-CM

## 2022-02-07 DIAGNOSIS — E7800 Pure hypercholesterolemia, unspecified: Secondary | ICD-10-CM | POA: Diagnosis not present

## 2022-02-07 DIAGNOSIS — Z23 Encounter for immunization: Secondary | ICD-10-CM

## 2022-02-07 MED ORDER — ACYCLOVIR 5 % EX CREA: 1.0000 | TOPICAL_CREAM | CUTANEOUS | 5 refills | Status: DC

## 2022-02-07 MED ORDER — CLOBETASOL PROPIONATE 0.05 % EX CREA: TOPICAL_CREAM | CUTANEOUS | 0 refills | Status: DC

## 2022-02-07 NOTE — Progress Notes (Signed)
Established Patient Office Visit  Subjective   Patient ID: Dennis Moon, male    DOB: 03-31-1946  Age: 76 y.o. MRN: [id number]  Chief Complaint  Patient presents with   Follow-up    6 month follow up, no concerns. Patient not fasting. Rx for Zovirax cream     HPI follow-up of hypothyroidism, prediabetes and elevated cholesterol with history of CAD and non-STEMI.  He is atorvastatin levothyroxine and metformin.  Controlled.  Uses Temovate for cutaneous lupus rash on his face all areas.  Uses Zovirax cream as needed fever blister.  Discussed history.  Walks as he can for exercise.  Exercise is limited due to chronic back issues.  Surgery has been recommended in the past.  Hesitant to do this however concerned about length of convalescence.    Review of Systems  Constitutional: Negative.   HENT: Negative.    Eyes:  Negative for blurred vision, discharge and redness.  Respiratory: Negative.    Cardiovascular: Negative.   Gastrointestinal:  Negative for abdominal pain.  Genitourinary: Negative.   Musculoskeletal: Negative.  Negative for myalgias.  Skin:  Negative for rash.  Neurological:  Negative for tingling, loss of consciousness and weakness.  Endo/Heme/Allergies:  Negative for polydipsia.      Objective:     BP 122/68 (BP Location: Left Arm, Patient Position: Sitting, Cuff Size: Large)   Pulse 66   Temp (!) 97.4 F (36.3 C) (Temporal)   Ht 6' (1.829 m)   Wt 239 lb 6.4 oz (108.6 kg)   SpO2 95%   BMI 32.47 kg/m    Physical Exam Constitutional:      General: He is not in acute distress.    Appearance: Normal appearance. He is not ill-appearing, toxic-appearing or diaphoretic.  HENT:     Head: Normocephalic and atraumatic.     Right Ear: External ear normal.     Left Ear: External ear normal.     Mouth/Throat:     Mouth: Mucous membranes are moist.     Pharynx: Oropharynx is clear. No oropharyngeal exudate or posterior oropharyngeal erythema.  Eyes:      General: No scleral icterus.       Right eye: No discharge.        Left eye: No discharge.     Extraocular Movements: Extraocular movements intact.     Conjunctiva/sclera: Conjunctivae normal.     Pupils: Pupils are equal, round, and reactive to light.  Cardiovascular:     Rate and Rhythm: Normal rate and regular rhythm.  Pulmonary:     Effort: Pulmonary effort is normal. No respiratory distress.     Breath sounds: Normal breath sounds.  Abdominal:     General: Bowel sounds are normal.  Musculoskeletal:     Cervical back: No rigidity or tenderness.  Skin:    General: Skin is warm and dry.  Neurological:     Mental Status: He is alert and oriented to person, place, and time.  Psychiatric:        Mood and Affect: Mood normal.        Behavior: Behavior normal.      No results found for any visits on 02/07/22.    The ASCVD Risk score (Arnett DK, et al., 2019) failed to calculate for the following reasons:   The patient has a prior MI or stroke diagnosis    Assessment & Plan:   Problem List Items Addressed This Visit       Digestive   Herpes  labialis   Relevant Medications   acyclovir cream (ZOVIRAX) 5 %     Endocrine   Hypothyroidism   Relevant Orders   TSH     Other   cutaneous lupus   Relevant Medications   clobetasol cream (TEMOVATE) 0.05 %   Elevated cholesterol   Relevant Orders   Comprehensive metabolic panel   Lipid panel   Prediabetes - Primary   Relevant Orders   CBC   Comprehensive metabolic panel   Urinalysis, Routine w reflex microscopic   Need for influenza vaccination   Relevant Orders   Flu vaccine HIGH DOSE PF (Fluzone High dose) (Completed)    Return in about 6 months (around 08/08/2022), or Return fasting for blood work.  Recommend COVID and RSV vaccines.[name], MD

## 2022-02-14 ENCOUNTER — Other Ambulatory Visit (INDEPENDENT_AMBULATORY_CARE_PROVIDER_SITE_OTHER): Payer: Medicare Other

## 2022-02-14 DIAGNOSIS — E039 Hypothyroidism, unspecified: Secondary | ICD-10-CM | POA: Diagnosis not present

## 2022-02-14 DIAGNOSIS — E7800 Pure hypercholesterolemia, unspecified: Secondary | ICD-10-CM | POA: Diagnosis not present

## 2022-02-14 DIAGNOSIS — R7303 Prediabetes: Secondary | ICD-10-CM

## 2022-02-14 LAB — URINALYSIS, ROUTINE W REFLEX MICROSCOPIC
Bilirubin Urine: NEGATIVE
Hgb urine dipstick: NEGATIVE
Ketones, ur: NEGATIVE
Leukocytes,Ua: NEGATIVE
Nitrite: NEGATIVE
Specific Gravity, Urine: 1.015 (ref 1.000–1.030)
Urine Glucose: NEGATIVE
Urobilinogen, UA: 0.2 (ref 0.0–1.0)
pH: 6 (ref 5.0–8.0)

## 2022-02-14 LAB — TSH: TSH: 4.82 u[IU]/mL (ref 0.35–5.50)

## 2022-02-14 LAB — CBC
HCT: 46.6 % (ref 39.0–52.0)
Hemoglobin: 15.3 g/dL (ref 13.0–17.0)
MCHC: 32.9 g/dL (ref 30.0–36.0)
MCV: 95.6 fl (ref 78.0–100.0)
Platelets: 181 10*3/uL (ref 150.0–400.0)
RBC: 4.88 Mil/uL (ref 4.22–5.81)
RDW: 15.9 % — ABNORMAL HIGH (ref 11.5–15.5)
WBC: 7.3 10*3/uL (ref 4.0–10.5)

## 2022-02-14 LAB — COMPREHENSIVE METABOLIC PANEL
ALT: 26 U/L (ref 0–53)
AST: 28 U/L (ref 0–37)
Albumin: 4 g/dL (ref 3.5–5.2)
Alkaline Phosphatase: 91 U/L (ref 39–117)
BUN: 18 mg/dL (ref 6–23)
CO2: 32 mEq/L (ref 19–32)
Calcium: 9.1 mg/dL (ref 8.4–10.5)
Chloride: 100 mEq/L (ref 96–112)
Creatinine, Ser: 1.24 mg/dL (ref 0.40–1.50)
GFR: 56.02 mL/min — ABNORMAL LOW (ref >60.00)
Glucose, Bld: 97 mg/dL (ref 70–99)
Potassium: 4.3 mEq/L (ref 3.5–5.1)
Sodium: 139 mEq/L (ref 135–145)
Total Bilirubin: 0.5 mg/dL (ref 0.2–1.2)
Total Protein: 7.5 g/dL (ref 6.0–8.3)

## 2022-02-14 LAB — LIPID PANEL
Cholesterol: 111 mg/dL (ref 0–200)
HDL: 40.2 mg/dL (ref >39.00)
LDL Cholesterol: 46 mg/dL (ref 0–99)
NonHDL: 70.59
Total CHOL/HDL Ratio: 3
Triglycerides: 121 mg/dL (ref 0.0–149.0)
VLDL: 24.2 mg/dL (ref 0.0–40.0)

## 2022-02-21 ENCOUNTER — Encounter: Payer: Self-pay | Admitting: Family Medicine

## 2022-03-11 ENCOUNTER — Other Ambulatory Visit: Payer: Self-pay | Admitting: Family Medicine

## 2022-03-12 ENCOUNTER — Other Ambulatory Visit: Payer: Self-pay

## 2022-03-12 ENCOUNTER — Ambulatory Visit: Payer: Medicare Other | Admitting: Cardiology

## 2022-03-14 NOTE — Progress Notes (Signed)
Cardiology Office Note    Date:  03/28/2022   ID:  IMRI LOR, DOB 1946-04-11, MRN [id number]  PCP:  [name], MD  Cardiologist: Dr. Martinique  Chief Complaint  Patient presents with   Coronary Artery Disease    History of Present Illness:    Dennis Moon is a 76 y.o. male with past medical history of HTN, HLD, and Stage 3 CKD who is seen for follow up CAD. He is also followed by Rheumatology for gout, subacute cutaneous lupus erythematosus, Sjogren's, osteoarthritis, and DDD.   He was  admitted to [facility] from 7/26 - 12/13/2016 for evaluation of chest pain.   EKG was without acute changes but his troponin values peaked at 0.40. A cardiac catheterization was performed which showed 2-vessel obstructive CAD with 90% Prox-LAD stenosis and 80% Prox Cx stenosis. PCI was performed with placement of DES to both the LAD and LCx. He was started on DAPT with ASA and Brilinta along with BB and statin therapy.   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   He was seen in March 2021 after an episode of chest pain while eating cereal. A Myoview study was ordered and was normal. In retrospect he feels this was his reflux.   He presented to [facility] on 03/29/2021 with GERD symptoms/chest pain, ruled in for NSTEMI (HsTroponin 9>170). Cath showed stable 2-vessel CAD, with patent stents to pLAD/pLCx, with 40% mLAD and 20% mRCA stenosis. Medical management advised. Echo showed EF 60-65%, GIDD, mild LAE. He was discharged the following day in stable condition. He was started on metformin for elevated A1c 6.4%. last A1c down to 5.9%.   On follow up today he is doing very well. 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   Had one episode of chest pain 2 months ago resolved with NTg and Alka Seltzer. None since. No other chest pain, dyspnea or palpitations.    Past Medical History:  Diagnosis Date   CAD S/P percutaneous coronary  angioplasty 12/12/2016   a. 11/2016: NSTEMI with DES to LAD and DES to LCx; b. MV low risk 2021; c. NSTEMI 11/22, LHC patent stents to LAD/LCx, 40% mLAD, 20% mRCA, stable, med mgmt. Echo 11/22 EF 60-65%, GIDD, mild LAE.   CKD (chronic kidney disease), stage III (HCC)    GERD (gastroesophageal reflux disease)    Gout    Hyperlipidemia LDL goal <70    Hypertension    Hypothyroidism    Migraine    Osteoarthritis    Sjogren's disease ([place])    Systemic lupus erythematosus ([place])     Past Surgical History:  Procedure Laterality Date   CATARACT EXTRACTION, BILATERAL Bilateral 2017   COLONOSCOPY  2021   CORONARY STENT INTERVENTION N/A 12/12/2016   Procedure: Coronary Stent Intervention;  Surgeon: Martinique, Math Brazie M, MD;  Location: [place] [facility];  Service: Cardiovascular;  Laterality: N/A;   HERNIA REPAIR     LEFT HEART CATH AND CORONARY ANGIOGRAPHY N/A 12/12/2016   Procedure: Left Heart Cath and Coronary Angiography;  Surgeon: Martinique, Rafeef Lau M, MD;  Location: [place] [facility];  Service: Cardiovascular;  Laterality: N/A;   LEFT HEART CATH AND CORONARY ANGIOGRAPHY N/A 03/29/2021   Procedure: LEFT HEART CATH AND CORONARY ANGIOGRAPHY;  Surgeon: Burnell Blanks, MD;  Location: [place] [facility];  Service: Cardiovascular;  Laterality: N/A;   THYROID SURGERY  1997    Current Medications: Outpatient  Medications Prior to Visit  Medication Sig Dispense Refill   acyclovir cream (ZOVIRAX) 5 % Apply 1 Application topically every 3 (three) hours. 15 g 5   albuterol (VENTOLIN HFA) 108 (90 Base) MCG/ACT inhaler INHALE 1 PUFF BY MOUTH INTO LUNGS EVERY 6 HOURS AS NEEDED FOR SHORTNESS OF BREATH OR wheezing 8.5 g 0   allopurinol (ZYLOPRIM) 300 MG tablet TAKE ONE TABLET BY MOUTH EVERY EVENING 90 tablet 0   amLODipine (NORVASC) 5 MG tablet TAKE ONE TABLET BY MOUTH ONCE DAILY 90 tablet 3   Ascorbic Acid (VITAMIN C) 1000 MG tablet Take 1,000 mg by mouth every morning.     aspirin-acetaminophen-caffeine  (EXCEDRIN EXTRA STRENGTH) 250-250-65 MG tablet Take 2 tablets by mouth 3 (three) times daily as needed for headache or migraine (pain).     atorvastatin (LIPITOR) 80 MG tablet Take 1 tablet (80 mg total) by mouth every evening. 90 tablet 2   Calcium Carb-Cholecalciferol (CALCIUM 600 + D PO) Take 1 tablet by mouth every morning.     cholecalciferol (VITAMIN D) 25 MCG (1000 UNIT) tablet Take 1,000 Units by mouth every morning.     clobetasol cream (TEMOVATE) 0.05 % Apply to affected area twice daily as needed for up to 2 weeks 30 g 0   CRANBERRY EXTRACT PO Take 4,200 mg by mouth 2 (two) times daily.     DULoxetine (CYMBALTA) 60 MG capsule TAKE ONE CAPSULE BY MOUTH EVERY EVENING 90 capsule 2   EPINEPHRINE 0.3 mg/0.3 mL IJ SOAJ injection USE AS DIRECTED (Patient taking differently: Inject 0.3 mg into the muscle as needed for anaphylaxis (severe allergic reaction).) 2 each 2   esomeprazole (NEXIUM) 20 MG capsule Take 1 capsule (20 mg total) by mouth 2 (two) times daily before a meal. 90 capsule 3   fluorometholone (FML) 0.1 % ophthalmic suspension Place 1 drop into both eyes daily as needed (dry eyes/irritation).     levocetirizine (XYZAL) 5 MG tablet Take 5 mg by mouth daily as needed (seasonal allergies).     levothyroxine (SYNTHROID) 112 MCG tablet TAKE ONE TABLET BY MOUTH EVERY EVENING 90 tablet 2   metFORMIN (GLUCOPHAGE-XR) 500 MG 24 hr tablet TAKE ONE TABLET BY MOUTH EVERY EVENING 90 tablet 3   Multiple Vitamin (MULTIVITAMIN WITH MINERALS) TABS tablet Take 1 tablet by mouth every morning. One a Day for Men over 50     Polyethyl Glycol-Propyl Glycol (SYSTANE) 0.4-0.3 % SOLN Place 1 drop into both eyes 2 (two) times daily as needed (dry eyes).     Probiotic Product (PROBIOTIC PO) Take 1 capsule by mouth every morning.     propranolol (INDERAL) 60 MG tablet TAKE ONE TABLET BY MOUTH EVERY MORNING and TAKE ONE TABLET BY MOUTH EVERY EVENING 180 tablet 4   testosterone (ANDROGEL) 50 MG/5GM (1%) GEL apply  FIVE grams (ONE PACKET) onto THE SKIN TWICE DAILY 300 g 4   Ubrogepant (UBRELVY) 50 MG TABS Take one at first sign of migraine. May repeat once in 2 hours. (Patient taking differently: Take 50 mg by mouth See admin instructions. Take one tablet (50 mg) by mouth at first sign of migraine; may repeat once in 2 hours if still needed) 10 tablet 2   zinc gluconate 50 MG tablet Take 50 mg by mouth every morning.     No facility-administered medications prior to visit.     Allergies:   Bee venom   Social History   Socioeconomic History   Marital status: Married    Spouse  name: Not on file   Number of children: Not on file   Years of education: Not on file   Highest education level: Not on file  Occupational History   Occupation: Dentist  Tobacco Use   Smoking status: Former    Packs/day: 1.00    Years: 6.00    Total pack years: 6.00    Types: Cigarettes    Quit date: 04/25/1966    Years since quitting: 55.9    Passive exposure: Past   Smokeless tobacco: Never  Vaping Use   Vaping Use: Never used  Substance and Sexual Activity   Alcohol use: Yes    Alcohol/week: 7.0 standard drinks of alcohol    Types: 7 Glasses of wine per week   Drug use: Never   Sexual activity: Not on file  Other Topics Concern   Not on file  Social History Narrative   Not on file   Social Determinants of Health   Financial Resource Strain: Low Risk  (11/07/2021)   Overall Financial Resource Strain (CARDIA)    Difficulty of Paying Living Expenses: Not hard at all  Food Insecurity: No Food Insecurity (11/07/2021)   Hunger Vital Sign    Worried About Running Out of Food in the Last Year: Never true    Ran Out of Food in the Last Year: Never true  Transportation Needs: No Transportation Needs (11/07/2021)   PRAPARE - Hydrologist (Medical): No    Lack of Transportation (Non-Medical): No  Physical Activity: Insufficiently Active (11/07/2021)   Exercise Vital Sign    Days of  Exercise per Week: 3 days    Minutes of Exercise per Session: 30 min  Stress: No Stress Concern Present (11/07/2021)   [place]    Feeling of Stress : Not at all  Social Connections: Moderately Integrated (11/07/2021)   Social Connection and Isolation Panel [NHANES]    Frequency of Communication with Friends and Family: Three times a week    Frequency of Social Gatherings with Friends and Family: Three times a week    Attends Religious Services: 1 to 4 times per year    Active Member of Clubs or Organizations: No    Attends Archivist Meetings: Never    Marital Status: Married     Family History:  The patient's family history includes Diabetes in his mother; Hypertension in his father and mother.   Review of Systems:   Please see the history of present illness.     All other systems reviewed and are otherwise negative except as noted above.   Physical Exam:    VS:  BP (!) 152/86   Pulse 81   Ht 6' (1.829 m)   Wt 238 lb 6.4 oz (108.1 kg)   SpO2 98%   BMI 32.33 kg/m    GENERAL:  Well appearing, overweight WM in NAD HEENT:  PERRL, EOMI, sclera are clear. Oropharynx is clear. NECK:  No jugular venous distention, carotid upstroke brisk and symmetric, no bruits, no thyromegaly or adenopathy LUNGS:  Clear to auscultation bilaterally CHEST:  Unremarkable HEART:  RRR,  PMI not displaced or sustained,S1 and S2 within normal limits, no S3, no S4: no clicks, no rubs, no murmurs ABD:  Soft, nontender. BS +, no masses or bruits. No hepatomegaly, no splenomegaly EXT:  2 + pulses throughout, no edema, no cyanosis no clubbing SKIN:  Warm and dry.  No rashes NEURO:  Alert and  oriented x 3. Cranial nerves II through XII intact. PSYCH:  Cognitively intact   Wt Readings from Last 3 Encounters:  03/28/22 238 lb 6.4 oz (108.1 kg)  02/07/22 239 lb 6.4 oz (108.6 kg)  01/24/22 237 lb (107.5 kg)      Studies/Labs  Reviewed:   EKG:  EKG is  ordered today. NSR rate 81. Incomplete RBBB. I have personally reviewed and interpreted this study.     Recent Labs: 03/29/2021: Magnesium 2.1 02/14/2022: ALT 26; BUN 18; Creatinine, Ser 1.24; Hemoglobin 15.3; Platelets 181.0; Potassium 4.3; Sodium 139; TSH 4.82   Lipid Panel    Component Value Date/Time   CHOL 111 02/14/2022 0753   CHOL 119 06/17/2019 0930   TRIG 121.0 02/14/2022 0753   HDL 40.20 02/14/2022 0753   HDL 41 06/17/2019 0930   CHOLHDL 3 02/14/2022 0753   VLDL 24.2 02/14/2022 0753   LDLCALC 46 02/14/2022 0753   LDLCALC 58 06/17/2019 0930    Additional studies/ records that were reviewed today include:   Echocardiogram: 12/12/2016 Study Conclusions   - Left ventricle: The cavity size was normal. Wall thickness was   increased in a pattern of mild LVH. Systolic function was normal.   The estimated ejection fraction was in the range of 60% to 65%.   Wall motion was normal; there were no regional wall motion   abnormalities. Doppler parameters are consistent with abnormal   left ventricular relaxation (grade 1 diastolic dysfunction). The   E/e&' ratio is <8, suggesting normal LV filling pressure. - Mitral valve: Mildly thickened leaflets . There was trivial   regurgitation. - Left atrium: The atrium was normal in size. - Tricuspid valve: There was trivial regurgitation. - Pulmonic valve: There was mild regurgitation. - Pulmonary arteries: PA peak pressure: 20 mm Hg (S). - Inferior vena cava: The vessel was normal in size. The   respirophasic diameter changes were in the normal range (>= 50%),   consistent with normal central venous pressure.   Impressions:   - LVEF 60-65%, mild LVH, normal wall motion, grade 1 DD, normal LV   filling pressure, normal LA size, trivial MR, TR, RVSP 20 mmHg,   normal IVC.  Cardiac Catheterization: 12/12/2016 Mid LAD lesion, 40 %stenosed. The left ventricular systolic function is normal. LV end  diastolic pressure is normal. The left ventricular ejection fraction is 55-65% by visual estimate. Prox LAD lesion, 90 %stenosed. A STENT PROMUS PREM MR 4.0X20 drug eluting stent was successfully placed. Post intervention, there is a 0% residual stenosis. Ost Cx to Prox Cx lesion, 80 %stenosed. A STENT XIENCE ALPINE RX 3.0X18 drug eluting stent was successfully placed. Post intervention, there is a 0% residual stenosis.   1. 2 vessel obstructive CAD 2. Normal LV function 3. Normal LVEDP 4. Successful stenting of the proximal LAD with DES 5. Successful stenting of the proximal LCx with DES.   Plan: DAPT for one year. Anticipate DC in am.   Myoview 07/29/19: Study Highlights  The left ventricular ejection fraction is mildly decreased (45-54%). Nuclear stress EF: 54%. There was no ST segment deviation noted during stress. The study is normal. This is a low risk study.   Cardiac cath 03/29/21:  LEFT HEART CATH AND CORONARY ANGIOGRAPHY   Conclusion      Prox RCA lesion is 20% stenosed.   Mid LAD lesion is 40% stenosed.   Previously placed Ost Cx to Prox Cx stent (unknown type) is  widely patent.   Previously placed Ost LAD to  Prox LAD stent (unknown type) is  widely patent.   Stable two vessel CAD Patent proximal LAD stent with mild mid LAD stenosis beyond the stent Patent proximal circumflex stent Mild plaque mid RCA.    Recommendations: Continue medical management of CAD. Echo in the am. Monitor on telemetry tonight.   Dominance: Right Intervention   Echo 03/30/21: IMPRESSIONS     1. Left ventricular ejection fraction, by estimation, is 60 to 65%. The  left ventricle has normal function. The left ventricle has no regional  wall motion abnormalities. There is mild left ventricular hypertrophy.  Left ventricular diastolic parameters  are consistent with Grade I diastolic dysfunction (impaired relaxation).   2. Right ventricular systolic function is normal. The right  ventricular  size is normal.   3. Left atrial size was mildly dilated.   4. The mitral valve is normal in structure. No evidence of mitral valve  regurgitation. No evidence of mitral stenosis.   5. The aortic valve is normal in structure. There is moderate  calcification of the aortic valve. There is mild thickening of the aortic  valve. Aortic valve regurgitation is not visualized. Aortic valve  sclerosis/calcification is present, without any  evidence of aortic stenosis.   6. The inferior vena cava is normal in size with greater than 50%  respiratory variability, suggesting right atrial pressure of 3 mmHg.     Assessment:    1. Coronary artery disease involving native coronary artery of native heart without angina pectoris   2. Essential hypertension   3. Hyperlipidemia LDL goal <70   4. Stage 3 chronic kidney disease, unspecified whether stage 3a or 3b CKD ([place])        Plan:   In order of problems listed above:  1. CAD s/p NSTEMI - s/p NSTEMI in July 2018. S/p  DES to both the LAD and LCx. Echo showed a preserved EF of 60-65% with no regional WMA.  -  Myoview March 2021 was normal.  - admitted with chest pain and mild troponin leak in November 2022. Cath with no obstructive disease. Normal EF. Rare recurrent chest pain - on ASA monotherapy.  - follow up in 0ne year  2. HTN - elevated today but good readings at primary care and rheumatology. Will monitor more closely  3. HLD - well controlled on high dose statin. LDL at goal.46  4. Stage 3 CKD - creatinine stable. Followed by Dr. Joelyn Oms.  5. DM now on metformin with improvement.    Signed, Angenette Daily Martinique, MD  03/28/2022 8:26 AM    [facility] [address], [place], [place] [address] Phone: [phone]

## 2022-03-21 ENCOUNTER — Other Ambulatory Visit (HOSPITAL_BASED_OUTPATIENT_CLINIC_OR_DEPARTMENT_OTHER): Payer: Self-pay | Admitting: Family Medicine

## 2022-03-28 ENCOUNTER — Encounter: Payer: Self-pay | Admitting: Cardiology

## 2022-03-28 ENCOUNTER — Ambulatory Visit: Payer: Medicare Other | Attending: Cardiology | Admitting: Cardiology

## 2022-03-28 VITALS — BP 144/88 | HR 81 | Ht 72.0 in | Wt 238.4 lb

## 2022-03-28 DIAGNOSIS — E785 Hyperlipidemia, unspecified: Secondary | ICD-10-CM | POA: Diagnosis not present

## 2022-03-28 DIAGNOSIS — I2510 Atherosclerotic heart disease of native coronary artery without angina pectoris: Secondary | ICD-10-CM | POA: Diagnosis not present

## 2022-03-28 DIAGNOSIS — I10 Essential (primary) hypertension: Secondary | ICD-10-CM | POA: Diagnosis not present

## 2022-03-28 DIAGNOSIS — N183 Chronic kidney disease, stage 3 unspecified: Secondary | ICD-10-CM

## 2022-04-25 DIAGNOSIS — L821 Other seborrheic keratosis: Secondary | ICD-10-CM | POA: Diagnosis not present

## 2022-04-25 DIAGNOSIS — L814 Other melanin hyperpigmentation: Secondary | ICD-10-CM | POA: Diagnosis not present

## 2022-04-25 DIAGNOSIS — L570 Actinic keratosis: Secondary | ICD-10-CM | POA: Diagnosis not present

## 2022-04-25 DIAGNOSIS — D225 Melanocytic nevi of trunk: Secondary | ICD-10-CM | POA: Diagnosis not present

## 2022-05-15 ENCOUNTER — Other Ambulatory Visit: Payer: Self-pay | Admitting: Family Medicine

## 2022-05-15 ENCOUNTER — Other Ambulatory Visit: Payer: Self-pay | Admitting: Physician Assistant

## 2022-05-15 DIAGNOSIS — J45909 Unspecified asthma, uncomplicated: Secondary | ICD-10-CM

## 2022-05-15 NOTE — Telephone Encounter (Signed)
Next Visit: 06/27/2022  Last Visit: 01/24/2022  Last Fill: 01/22/2022  DX: Idiopathic chronic gout of multiple sites without tophus   Current Dose per office note 01/24/2022: allopurinol 300 mg 1 tablet by mouth daily   Labs: 02/14/2022 GFR 56.02, RDW 15.9, Uric Acid 01/24/2022 4.3  Okay to refill Allopurinol?

## 2022-05-19 DIAGNOSIS — Z8619 Personal history of other infectious and parasitic diseases: Secondary | ICD-10-CM

## 2022-05-19 HISTORY — DX: Personal history of other infectious and parasitic diseases: Z86.19

## 2022-06-06 ENCOUNTER — Other Ambulatory Visit: Payer: Self-pay | Admitting: Family Medicine

## 2022-06-06 DIAGNOSIS — E291 Testicular hypofunction: Secondary | ICD-10-CM

## 2022-06-08 ENCOUNTER — Other Ambulatory Visit: Payer: Self-pay | Admitting: Family Medicine

## 2022-06-08 DIAGNOSIS — E039 Hypothyroidism, unspecified: Secondary | ICD-10-CM

## 2022-06-13 NOTE — Progress Notes (Signed)
Office Visit Note  Patient: Dennis Moon             Date of Birth: 1945-09-22           MRN: [id number]             PCP: [name], MD Referring: [name],* Visit Date: 06/27/2022 Occupation: @GUAROCC$ @  Subjective:  Medication monitoring   History of Present Illness: Dennis Moon is a 77 y.o. male with history of Sjogren syndrome, cutaneous lupus, and gout.  Patient is no longer taking Plaquenil or any other immunosuppressive agents.  He discontinued Plaquenil in 2020 and has not had any new or worsening symptoms since then.  He continues to experience intermittent arthralgias and joint stiffness.  He has not noticed any joint swelling.  He has had some increased discomfort in his lower back but not to the point that he is required a repeat back injection.  He continues to have chronic sicca symptoms but overall his symptoms have been tolerable.  He has been using refresh eyedrops and several over-the-counter products for mouth dryness as needed.  He was evaluated by his optometrist yesterday.  He continues to see his retina specialist on a regular basis.   She denies any signs or symptoms of a cutaneous lupus flare.  He has been seeing his dermatologist on a yearly basis.   He denies any signs or symptoms of a gout flare.  He remains on allopurinol 300 mg 1 tablet by mouth daily for management of gout.  He has an upcoming appointment with his nephrologist next Friday.  According to the patient he remains with send chronic kidney disease stage III.  Activities of Daily Living:  Patient reports morning stiffness for 2 hours.   Patient Denies nocturnal pain.  Difficulty dressing/grooming: Denies Difficulty climbing stairs: Denies Difficulty getting out of chair: Denies Difficulty using hands for taps, buttons, cutlery, and/or writing: Denies  Review of Systems  Constitutional:  Positive for fatigue.  HENT:  Positive for mouth dryness. Negative for mouth  sores.   Eyes:  Positive for dryness.  Respiratory: Negative.  Negative for shortness of breath.   Cardiovascular: Negative.  Negative for chest pain and palpitations.  Gastrointestinal: Negative.  Negative for blood in stool, constipation and diarrhea.  Endocrine: Negative.  Negative for increased urination.  Genitourinary: Negative.  Negative for involuntary urination.  Musculoskeletal:  Positive for joint pain, gait problem, joint pain, myalgias, muscle weakness, morning stiffness, muscle tenderness and myalgias. Negative for joint swelling.  Skin:  Positive for sensitivity to sunlight. Negative for pallor and hair loss.  Allergic/Immunologic: Negative.  Negative for susceptible to infections.  Neurological:  Positive for headaches. Negative for dizziness.  Hematological: Negative.  Negative for swollen glands.  Psychiatric/Behavioral: Negative.  Negative for depressed mood and sleep disturbance. The patient is not nervous/anxious.     PMFS History:  Patient Active Problem List   Diagnosis Date Noted   Need for influenza vaccination 02/07/2022   Herpes labialis 02/07/2022   Mild reactive airways disease 07/26/2021   Hospital discharge follow-up 04/26/2021   Prediabetes 04/26/2021   Unstable angina ([place]) 03/29/2021   Chest pain 03/29/2021   Gastroesophageal reflux disease 09/07/2020   Allergy to honey bee venom 09/07/2020   History of gout 12/29/2019   Elevated cholesterol 12/29/2019   Healthcare maintenance 12/29/2019   Nonintractable headache 06/24/2019   Androgen deficiency 11/17/2017   Hypothyroidism 11/17/2017   Cyst of tonsil 06/11/2017   Obesity (BMI  30.0-34.9) 03/12/2017   CAD S/P percutaneous coronary angioplasty 12/13/2016   NSTEMI (non-ST elevated myocardial infarction) ([place]) 12/11/2016   Gout of multiple sites 08/26/2016   Chronic renal impairment 08/26/2016   Chronic kidney disease 08/26/2016   Sjogren's syndrome ([place]) 04/23/2016   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    Idiopathic chronic gout, unspecified site, without tophus (tophi) 04/23/2016   High risk medication use 04/23/2016   Primary osteoarthritis of both hands 04/23/2016   DDD (degenerative disc disease), lumbar 04/23/2016   Kidney disease 04/23/2016    Past Medical History:  Diagnosis Date   CAD S/P percutaneous coronary angioplasty 12/12/2016   a. 11/2016: NSTEMI with DES to LAD and DES to LCx; b. MV low risk 2021; c. NSTEMI 11/22, LHC patent stents to LAD/LCx, 40% mLAD, 20% mRCA, stable, med mgmt. Echo 11/22 EF 60-65%, GIDD, mild LAE.   CKD (chronic kidney disease), stage III (HCC)    GERD (gastroesophageal reflux disease)    Gout    Hyperlipidemia LDL goal <70    Hypertension    Hypothyroidism    Migraine    Osteoarthritis    Sjogren's disease ([place])    Systemic lupus erythematosus ([place])     Family History  Problem Relation Age of Onset   Diabetes Mother    Hypertension Mother    Hypertension Father    Healthy Son    Past Surgical History:  Procedure Laterality Date   CATARACT EXTRACTION, BILATERAL Bilateral 2017   COLONOSCOPY  2021   CORONARY STENT INTERVENTION N/A 12/12/2016   Procedure: Coronary Stent Intervention;  Surgeon: Martinique, Peter M, MD;  Location: [facility];  Service: Cardiovascular;  Laterality: N/A;   HERNIA REPAIR     LEFT HEART CATH AND CORONARY ANGIOGRAPHY N/A 12/12/2016   Procedure: Left Heart Cath and Coronary Angiography;  Surgeon: Martinique, Peter M, MD;  Location: [facility];  Service: Cardiovascular;  Laterality: N/A;   LEFT HEART CATH AND CORONARY ANGIOGRAPHY N/A 03/29/2021   Procedure: LEFT HEART CATH AND CORONARY ANGIOGRAPHY;  Surgeon: Burnell Blanks, MD;  Location: [facility];  Service: Cardiovascular;  Laterality: N/A;   THYROID SURGERY  1997   Social History   Social History Narrative   Not on file   Immunization History  Administered Date(s) Administered   Influenza Whole 02/25/2021   Influenza, High Dose  Seasonal PF 02/07/2022   Influenza-Unspecified 02/24/2018, 02/04/2019, 02/16/2020   PFIZER(Purple Top)SARS-COV-2 Vaccination 06/17/2019, 07/08/2019, 02/16/2020   Pneumococcal Conjugate-13 10/21/2013   Pneumococcal Polysaccharide-23 08/02/2008, 03/14/2016   Tdap 07/18/2019   Zoster Recombinat (Shingrix) 11/12/2017   Zoster, Live 08/28/2006     Objective: Vital Signs: BP 126/74 (BP Location: Left Arm, Patient Position: Sitting, Cuff Size: Normal)   Pulse 82   Resp 16   Ht 6' (1.829 m)   Wt 238 lb (108 kg)   BMI 32.28 kg/m    Physical Exam Vitals and nursing note reviewed.  Constitutional:      Appearance: He is well-developed.  HENT:     Head: Normocephalic and atraumatic.  Eyes:     Conjunctiva/sclera: Conjunctivae normal.     Pupils: Pupils are equal, round, and reactive to light.  Cardiovascular:     Rate and Rhythm: Normal rate and regular rhythm.     Heart sounds: Normal heart sounds.  Pulmonary:     Effort: Pulmonary effort is normal.     Breath sounds: Normal breath sounds.  Abdominal:     General: Bowel sounds are normal.  Palpations: Abdomen is soft.  Musculoskeletal:     Cervical back: Normal range of motion and neck supple.  Skin:    General: Skin is warm and dry.     Capillary Refill: Capillary refill takes less than 2 seconds.  Neurological:     Mental Status: He is alert and oriented to person, place, and time.  Psychiatric:        Behavior: Behavior normal.      Musculoskeletal Exam: C-spine has limited range of motion with lateral rotation.  Some discomfort with lumbar range of motion.  Shoulder joints, elbow joints, wrist joints, MCPs, PIPs, DIPs have good range of motion with no synovitis.  PIP and DIP thickening noted.  Hip joints have good range of motion with no groin pain.  Knee joints have good range of motion with no warmth or effusion.  Ankle joints have good range of motion with no tenderness or joint swelling.  CDAI Exam: CDAI Score:  -- Patient Global: --; Provider Global: -- Swollen: --; Tender: -- Joint Exam 06/27/2022   No joint exam has been documented for this visit   There is currently no information documented on the homunculus. Go to the Rheumatology activity and complete the homunculus joint exam.  Investigation: No additional findings.  Imaging: No results found.  Recent Labs: Lab Results  Component Value Date   WBC 7.3 02/14/2022   HGB 15.3 02/14/2022   PLT 181.0 02/14/2022   NA 139 02/14/2022   K 4.3 02/14/2022   CL 100 02/14/2022   CO2 32 02/14/2022   GLUCOSE 97 02/14/2022   BUN 18 02/14/2022   CREATININE 1.24 02/14/2022   BILITOT 0.5 02/14/2022   ALKPHOS 91 02/14/2022   AST 28 02/14/2022   ALT 26 02/14/2022   PROT 7.5 02/14/2022   ALBUMIN 4.0 02/14/2022   CALCIUM 9.1 02/14/2022   GFRAA 59 (L) 07/01/2019    Speciality Comments: PLQ eye exam normal on 05/07/18  @ [facility].  Procedures:  No procedures performed Allergies: Bee venom     Assessment / Plan:     Visit Diagnoses: Sjogren's syndrome with other organ involvement ([place]) - Positive ANA, positive Ro, positive La: Patient continues to have chronic sicca symptoms which have been manageable overall.  He has been using refresh eyedrops for symptomatic relief as well as over-the-counter products for dry mouth as needed.  He states that he has also tried Basic Bites for dry mouth and enamel health.   He continues to experience intermittent arthralgias and joint stiffness but had no synovitis on examination today. Lab work from 01/24/22 was reviewed today in the office: Protein creatinine ratio was slightly elevated, ESR 6, uric acid 4.3, complements within normal limits, and double-stranded DNA negative.  The following lab work will be obtained today for further evaluation.  He does not require immunosuppressive therapy at this time.  He was advised to notify us if he develops any new or worsening symptoms.  He will follow-up  in the office in 5 to 6 months or sooner if needed.  - Plan: CBC with Differential/Platelet, C3 and C4, Sedimentation rate, Uric acid, Serum protein electrophoresis with reflex, Sjogrens syndrome-A extractable nuclear antibody, ANA, Protein / creatinine ratio, urine  Subacute cutaneous lupus erythematosus - He had 1 episode of rash on his left arm which responded to topical agents.  No recurrence.  Followed by dermatology on a yearly basis.  Discussed the importance of wearing sun protective clothing, avoiding direct sun exposure, and wearing  sunscreen SPF greater than 50 on a daily basis.. - Plan: ANA  High risk medication use - d/c PLQ due to abnormal eye exam 11/2018.  Not currently taking immunosuppressive agents.- Plan: CBC with Differential/Platelet  Idiopathic chronic gout of multiple sites without tophus - He has not had any signs or symptoms of a gout flare.  He has clinically been doing well taking allopurinol 300 mg 1 tablet by mouth daily. uric acid: 4.3 on 01/24/2022.  Uric acid level be rechecked today.- Plan: Uric acid  Primary osteoarthritis of both hands: He has PIP and DIP thickening consistent with osteoarthritis of both hands.  No synovitis noted on examination today.  Discussed the importance of joint protection and muscle strengthening.  DDD (degenerative disc disease), lumbar: He continues to experience intermittent discomfort in his lower back.  Her symptoms have been tolerable to the point that he has not needed to reschedule an injection with his spine specialist.  Other medical conditions are listed as follows:  CAD S/P percutaneous coronary angioplasty  History of non-ST elevation myocardial infarction (NSTEMI) - He underwent a left heart catheterization on 03/29/2021.  History of chronic kidney disease - He is followed by nephrologist.  He is scheduled for an upcoming appointment next Friday, will be having updated lab work at that time.  Plan to hold off on updating CMP  until his nephrologist has ordered it.- Plan: Protein / creatinine ratio, urine  Orders: Orders Placed This Encounter  Procedures   CBC with Differential/Platelet   C3 and C4   Sedimentation rate   Uric acid   Serum protein electrophoresis with reflex   Sjogrens syndrome-A extractable nuclear antibody   ANA   Protein / creatinine ratio, urine   No orders of the defined types were placed in this encounter.     Follow-Up Instructions: Return in about 6 months (around 12/26/2022) for Sjogren's syndrome, Cutaneous lupus, Gout.   Ofilia Neas, PA-C  Note - This record has been created using Dragon software.  Chart creation errors have been sought, but may not always  have been located. Such creation errors do not reflect on  the standard of medical care.

## 2022-06-22 IMAGING — CR DG CHEST 2V
2 series · 2 of 2 positions shown · non-contrast
Comparison: Chest x-ray 12/11/2016

CLINICAL DATA: Chest pain

EXAM:
CHEST - 2 VIEW

[w chest pa]
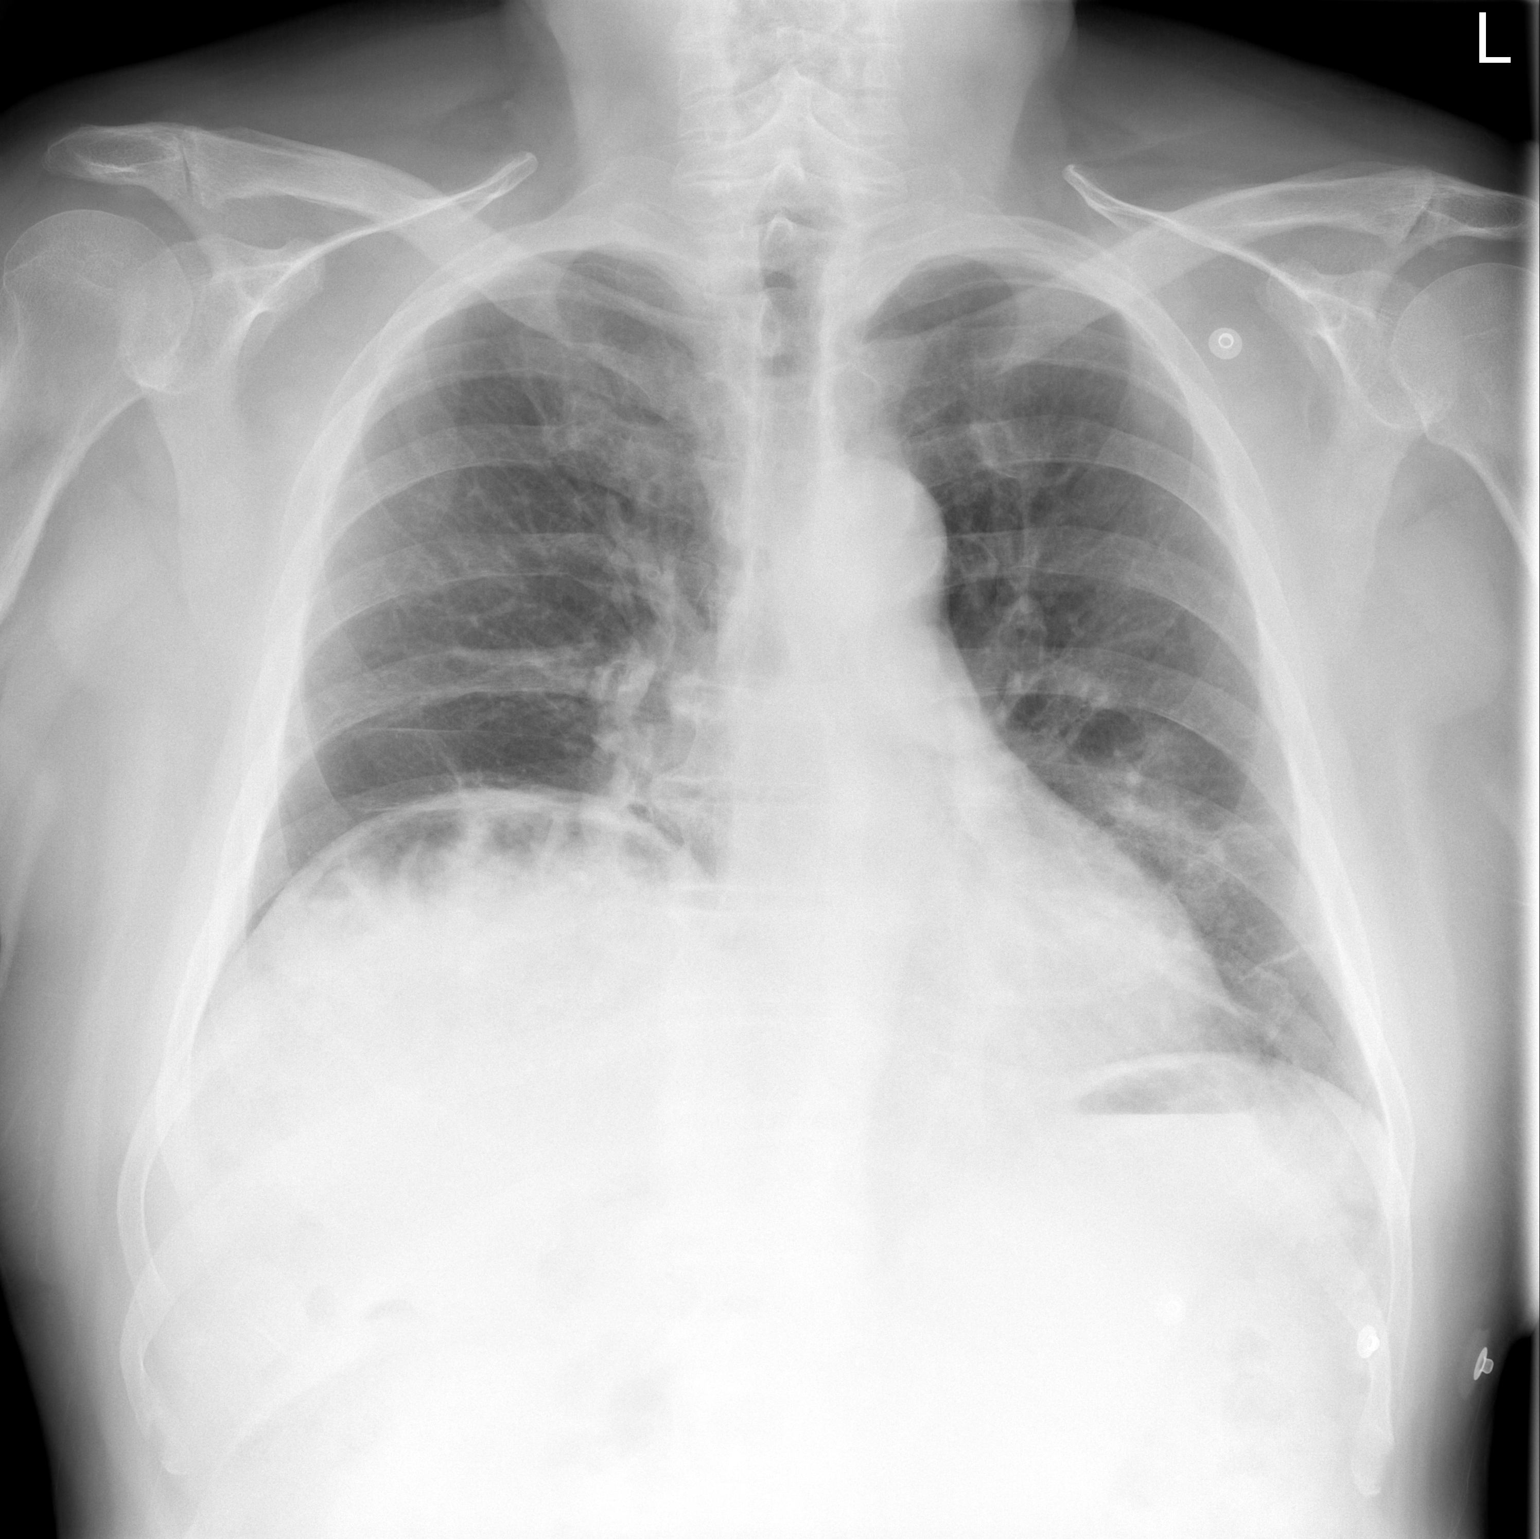

[w chest lat]
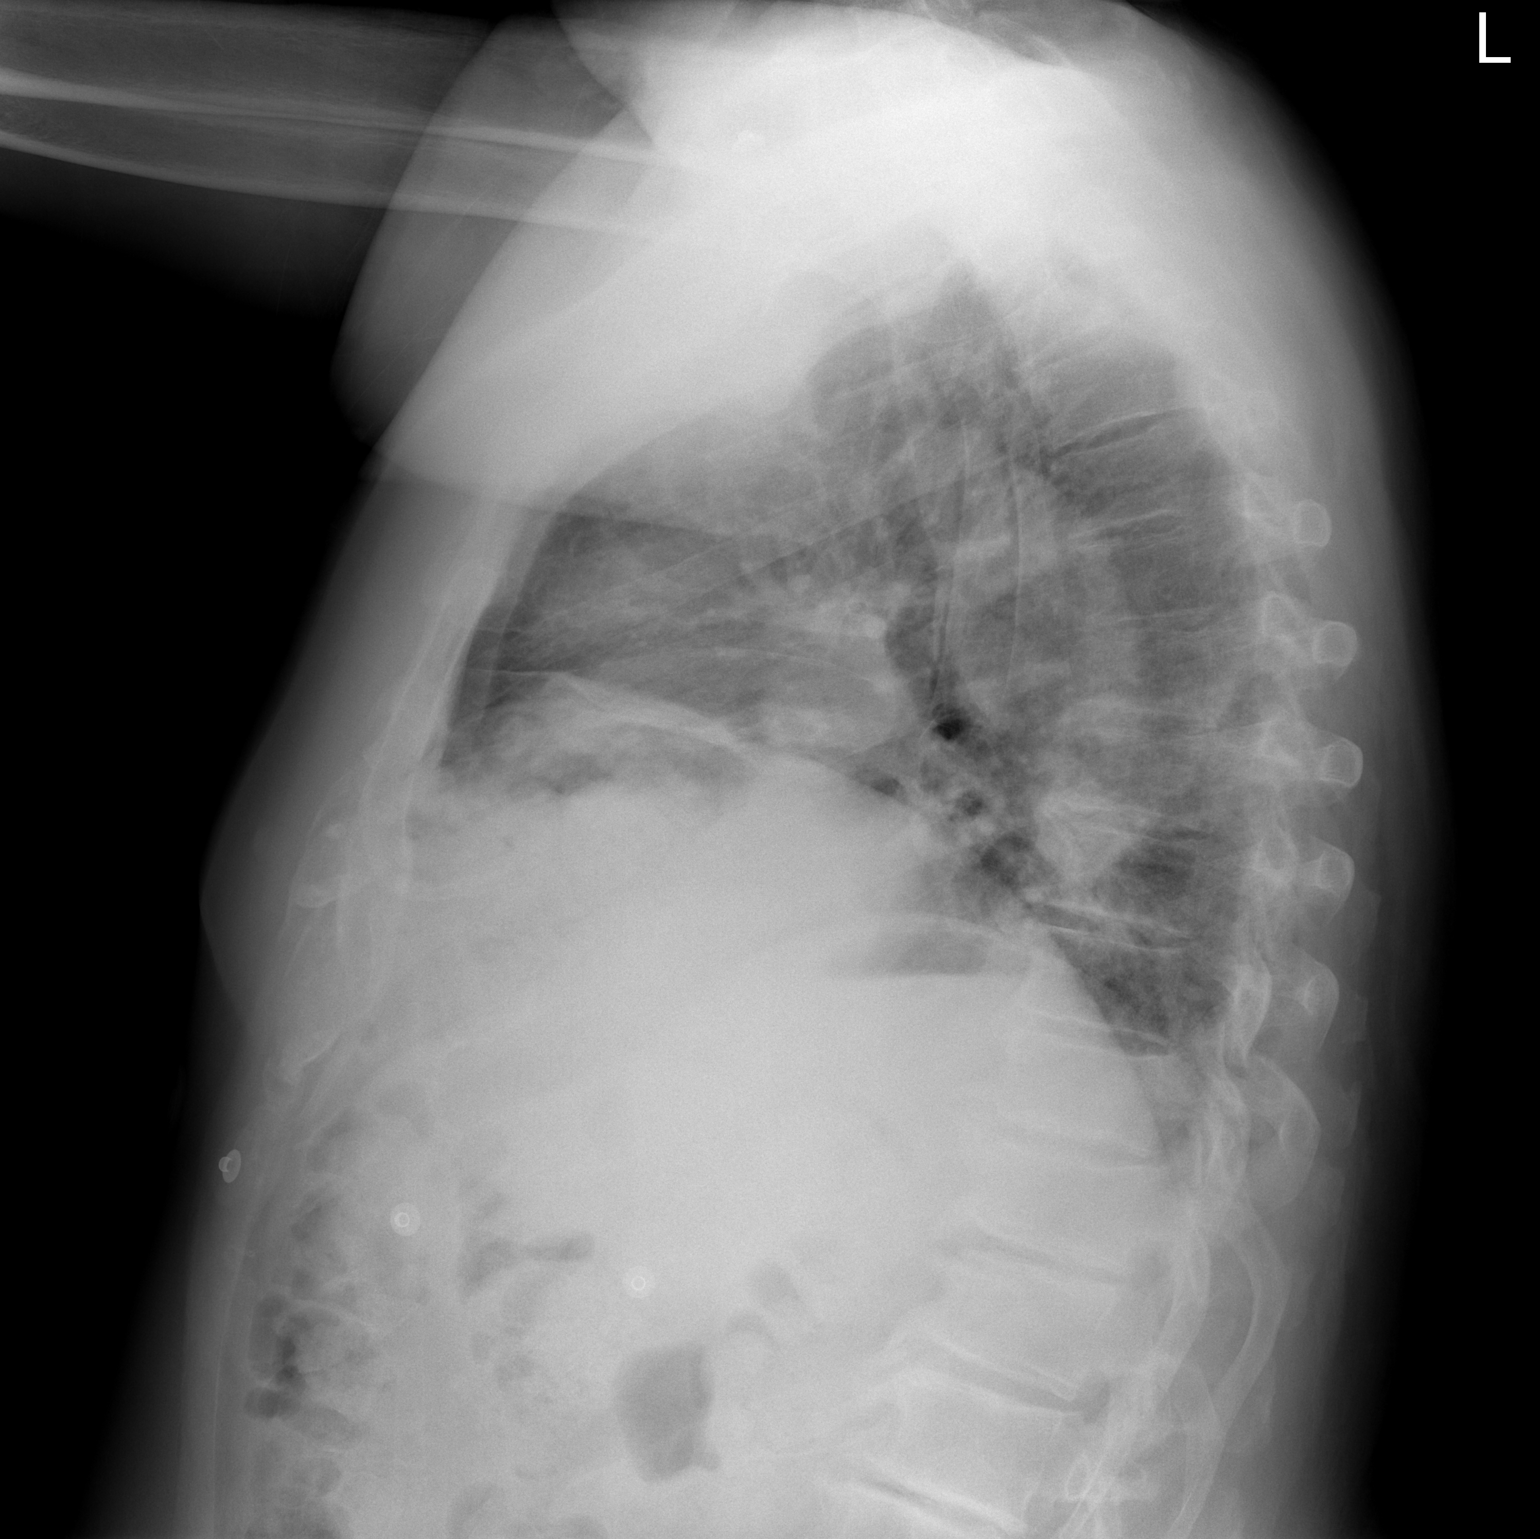

[2 of 2 positions shown; findings below may reference images not displayed]

FINDINGS: Heart size is normal. Mediastinum appears stable. Mildly elevated
right hemidiaphragm with loop of bowel beneath the diaphragm. Mild
irregular hazy opacities in the left lower lung zone. No pleural
effusion or pneumothorax.
IMPRESSION: Mild irregular hazy opacities in the left lower lung zone which may
represent atelectasis or infiltrate.

## 2022-06-24 ENCOUNTER — Telehealth: Payer: Self-pay | Admitting: Family Medicine

## 2022-06-24 DIAGNOSIS — J45909 Unspecified asthma, uncomplicated: Secondary | ICD-10-CM

## 2022-06-24 DIAGNOSIS — E291 Testicular hypofunction: Secondary | ICD-10-CM

## 2022-06-24 MED ORDER — TESTOSTERONE 50 MG/5GM (1%) TD GEL: TRANSDERMAL | 4 refills | Status: DC

## 2022-06-24 NOTE — Telephone Encounter (Signed)
Please advise message below patient calling for refill on pending Rx

## 2022-06-24 NOTE — Telephone Encounter (Signed)
Pt got a call from [facility] stating they are unable to fill testosterone (ANDROGEL) 50 MG/5GM (1%) GEL [[id number]]  and for him to get this filled at a pharmacy close to him. He would like this script sent over to [facility] at [facility]  Address: [address], [place], [place] [address] Phone: [phone].  He has 2 or 3 days worth. Pt at [phone]

## 2022-06-25 MED ORDER — ALBUTEROL SULFATE HFA 108 (90 BASE) MCG/ACT IN AERS: INHALATION_SPRAY | RESPIRATORY_TRACT | 3 refills | Status: DC

## 2022-06-25 NOTE — Addendum Note (Signed)
Addended by: Lynda Rainwater on: 06/25/2022 09:26 AM   Modules accepted: Orders

## 2022-06-26 DIAGNOSIS — M3501 Sicca syndrome with keratoconjunctivitis: Secondary | ICD-10-CM | POA: Diagnosis not present

## 2022-06-26 DIAGNOSIS — T372X5A Adverse effect of antimalarials and drugs acting on other blood protozoa, initial encounter: Secondary | ICD-10-CM | POA: Diagnosis not present

## 2022-06-26 DIAGNOSIS — H532 Diplopia: Secondary | ICD-10-CM | POA: Diagnosis not present

## 2022-06-26 DIAGNOSIS — Z79899 Other long term (current) drug therapy: Secondary | ICD-10-CM | POA: Diagnosis not present

## 2022-06-26 DIAGNOSIS — D3132 Benign neoplasm of left choroid: Secondary | ICD-10-CM | POA: Diagnosis not present

## 2022-06-27 ENCOUNTER — Ambulatory Visit: Payer: Medicare Other | Attending: Physician Assistant | Admitting: Physician Assistant

## 2022-06-27 ENCOUNTER — Encounter: Payer: Self-pay | Admitting: Physician Assistant

## 2022-06-27 VITALS — BP 126/74 | HR 82 | Resp 16 | Ht 72.0 in | Wt 238.0 lb

## 2022-06-27 DIAGNOSIS — I2510 Atherosclerotic heart disease of native coronary artery without angina pectoris: Secondary | ICD-10-CM | POA: Diagnosis not present

## 2022-06-27 DIAGNOSIS — Z87448 Personal history of other diseases of urinary system: Secondary | ICD-10-CM | POA: Diagnosis not present

## 2022-06-27 DIAGNOSIS — M5136 Other intervertebral disc degeneration, lumbar region: Secondary | ICD-10-CM | POA: Diagnosis not present

## 2022-06-27 DIAGNOSIS — M1A09X0 Idiopathic chronic gout, multiple sites, without tophus (tophi): Secondary | ICD-10-CM

## 2022-06-27 DIAGNOSIS — L931 Subacute cutaneous lupus erythematosus: Secondary | ICD-10-CM | POA: Diagnosis not present

## 2022-06-27 DIAGNOSIS — M19041 Primary osteoarthritis, right hand: Secondary | ICD-10-CM | POA: Diagnosis not present

## 2022-06-27 DIAGNOSIS — M19042 Primary osteoarthritis, left hand: Secondary | ICD-10-CM

## 2022-06-27 DIAGNOSIS — Z9861 Coronary angioplasty status: Secondary | ICD-10-CM | POA: Diagnosis not present

## 2022-06-27 DIAGNOSIS — M3509 Sicca syndrome with other organ involvement: Secondary | ICD-10-CM

## 2022-06-27 DIAGNOSIS — I252 Old myocardial infarction: Secondary | ICD-10-CM

## 2022-06-27 DIAGNOSIS — Z79899 Other long term (current) drug therapy: Secondary | ICD-10-CM

## 2022-06-27 NOTE — Progress Notes (Signed)
Hgb and hct are borderline elevated. Rest of CBC WNL.

## 2022-06-28 ENCOUNTER — Other Ambulatory Visit: Payer: Self-pay | Admitting: Family Medicine

## 2022-06-28 DIAGNOSIS — J45909 Unspecified asthma, uncomplicated: Secondary | ICD-10-CM

## 2022-06-30 NOTE — Progress Notes (Signed)
Ro antibody remains positive.  ESR WNL Uric acid WNL.  Complements WNL.  Protein creatinine ratio remains slightly elevated.  Please forward results to his nephrologist-he has an upcoming appointment this week.

## 2022-07-01 NOTE — Progress Notes (Signed)
SPEP did not reveal any abnormal protein bands.

## 2022-07-02 LAB — CBC WITH DIFFERENTIAL/PLATELET
Absolute Monocytes: 592 cells/uL (ref 200–950)
Basophils Absolute: 33 cells/uL (ref 0–200)
Basophils Relative: 0.5 %
Eosinophils Absolute: 182 cells/uL (ref 15–500)
Eosinophils Relative: 2.8 %
HCT: 51.3 % — ABNORMAL HIGH (ref 38.5–50.0)
Hemoglobin: 17.2 g/dL — ABNORMAL HIGH (ref 13.2–17.1)
Lymphs Abs: 982 cells/uL (ref 850–3900)
MCH: 31.4 pg (ref 27.0–33.0)
MCHC: 33.5 g/dL (ref 32.0–36.0)
MCV: 93.6 fL (ref 80.0–100.0)
MPV: 10.8 fL (ref 7.5–12.5)
Monocytes Relative: 9.1 %
Neutro Abs: 4713 cells/uL (ref 1500–7800)
Neutrophils Relative %: 72.5 %
Platelets: 227 10*3/uL (ref 140–400)
RBC: 5.48 10*6/uL (ref 4.20–5.80)
RDW: 13.2 % (ref 11.0–15.0)
Total Lymphocyte: 15.1 %
WBC: 6.5 10*3/uL (ref 3.8–10.8)

## 2022-07-02 LAB — SEDIMENTATION RATE: Sed Rate: 9 mm/h (ref 0–20)

## 2022-07-02 LAB — ANTI-NUCLEAR AB-TITER (ANA TITER)
ANA TITER: 1:80 {titer} — ABNORMAL HIGH
ANA Titer 1: 1:320 {titer} — ABNORMAL HIGH

## 2022-07-02 LAB — PROTEIN ELECTROPHORESIS, SERUM, WITH REFLEX
Albumin ELP: 4.3 g/dL (ref 3.8–4.8)
Alpha 1: 0.3 g/dL (ref 0.2–0.3)
Alpha 2: 0.8 g/dL (ref 0.5–0.9)
Beta 2: 0.5 g/dL (ref 0.2–0.5)
Beta Globulin: 0.5 g/dL (ref 0.4–0.6)
Gamma Globulin: 1.6 g/dL (ref 0.8–1.7)
Total Protein: 8 g/dL (ref 6.1–8.1)

## 2022-07-02 LAB — SJOGRENS SYNDROME-A EXTRACTABLE NUCLEAR ANTIBODY: SSA (Ro) (ENA) Antibody, IgG: >8 AI — AB

## 2022-07-02 LAB — C3 AND C4
C3 Complement: 157 mg/dL (ref 82–185)
C4 Complement: 30 mg/dL (ref 15–53)

## 2022-07-02 LAB — PROTEIN / CREATININE RATIO, URINE
Creatinine, Urine: 213 mg/dL (ref 20–320)
Protein/Creat Ratio: 254 mg/g creat — ABNORMAL HIGH (ref 25–148)
Protein/Creatinine Ratio: 0.254 mg/mg creat — ABNORMAL HIGH (ref 0.025–0.148)
Total Protein, Urine: 54 mg/dL — ABNORMAL HIGH (ref 5–25)

## 2022-07-02 LAB — ANA: Anti Nuclear Antibody (ANA): POSITIVE — AB

## 2022-07-02 LAB — URIC ACID: Uric Acid, Serum: 4.1 mg/dL (ref 4.0–8.0)

## 2022-07-02 NOTE — Progress Notes (Signed)
ANA remains positive.  No changes recommended at this time.

## 2022-07-04 ENCOUNTER — Ambulatory Visit (INDEPENDENT_AMBULATORY_CARE_PROVIDER_SITE_OTHER): Payer: Medicare Other | Admitting: Family Medicine

## 2022-07-04 ENCOUNTER — Encounter: Payer: Self-pay | Admitting: Family Medicine

## 2022-07-04 VITALS — BP 134/84 | HR 88 | Temp 97.7°F | Wt 238.2 lb

## 2022-07-04 DIAGNOSIS — R059 Cough, unspecified: Secondary | ICD-10-CM

## 2022-07-04 LAB — POC COVID19 BINAXNOW: SARS Coronavirus 2 Ag: NEGATIVE

## 2022-07-04 LAB — POCT INFLUENZA A/B
Influenza A, POC: NEGATIVE
Influenza B, POC: NEGATIVE

## 2022-07-04 MED ORDER — BENZONATATE 100 MG PO CAPS: 100.0000 mg | ORAL_CAPSULE | Freq: Two times a day (BID) | ORAL | 0 refills | Status: DC | PRN

## 2022-07-04 NOTE — Progress Notes (Signed)
Assessment/Plan:   Problem List Items Addressed This Visit   None Visit Diagnoses     Cough, unspecified type    -  Primary   Relevant Orders   POC COVID-19 BinaxNow   POCT Influenza A/B       There are no discontinued medications.    Subjective:  HPI: Encounter date: 07/04/2022  Dennis Moon is a 77 y.o. male who has Sjogren's syndrome ([place]); cutaneous lupus; Idiopathic chronic gout, unspecified site, without tophus (tophi); High risk medication use; Primary osteoarthritis of both hands; DDD (degenerative disc disease), lumbar; Kidney disease; Gout of multiple sites; Chronic renal impairment; Chronic kidney disease; NSTEMI (non-ST elevated myocardial infarction) ([place]); CAD S/P percutaneous coronary angioplasty; Obesity (BMI 30.0-34.9); Cyst of tonsil; Androgen deficiency; Hypothyroidism; Nonintractable headache; History of gout; Elevated cholesterol; Healthcare maintenance; Gastroesophageal reflux disease; Allergy to honey bee venom; Unstable angina ([place]); Chest pain; Hospital discharge follow-up; Prediabetes; Mild reactive airways disease; Need for influenza vaccination; and Herpes labialis on their problem list..   He  has a past medical history of CAD S/P percutaneous coronary angioplasty (12/12/2016), CKD (chronic kidney disease), stage III ([place]), GERD (gastroesophageal reflux disease), Gout, Hyperlipidemia LDL goal <70, Hypertension, Hypothyroidism, Migraine, Osteoarthritis, Sjogren's disease ([place]), and Systemic lupus erythematosus ([place]).[name]   He presents with chief complaint of Cough (Cough and runny nose x 2-3 days. Tx with mucinex dm ) .   Past Surgical History:  Procedure Laterality Date  . CATARACT EXTRACTION, BILATERAL Bilateral 2017  . COLONOSCOPY  2021  . CORONARY STENT INTERVENTION N/A 12/12/2016   Procedure: Coronary Stent Intervention;  Surgeon: Martinique, Peter M, MD;  Location: [facility];  Service: Cardiovascular;  Laterality: N/A;  . HERNIA REPAIR     . LEFT HEART CATH AND CORONARY ANGIOGRAPHY N/A 12/12/2016   Procedure: Left Heart Cath and Coronary Angiography;  Surgeon: Martinique, Peter M, MD;  Location: [facility];  Service: Cardiovascular;  Laterality: N/A;  . LEFT HEART CATH AND CORONARY ANGIOGRAPHY N/A 03/29/2021   Procedure: LEFT HEART CATH AND CORONARY ANGIOGRAPHY;  Surgeon: Burnell Blanks, MD;  Location: [facility];  Service: Cardiovascular;  Laterality: N/A;  . THYROID SURGERY  1997    Outpatient Medications Prior to Visit  Medication Sig Dispense Refill  . acyclovir cream (ZOVIRAX) 5 % Apply 1 Application topically every 3 (three) hours. 15 g 5  . albuterol (VENTOLIN HFA) 108 (90 Base) MCG/ACT inhaler INHALE 1 PUFF BY MOUTH INTO LUNGS EVERY 6 HOURS AS NEEDED FOR SHORTNESS OF BREATH OR wheezing 8.5 g 3  . allopurinol (ZYLOPRIM) 300 MG tablet TAKE ONE TABLET BY MOUTH EVERY EVENING 90 tablet 0  . amLODipine (NORVASC) 5 MG tablet TAKE ONE TABLET BY MOUTH ONCE DAILY 90 tablet 3  . Ascorbic Acid (VITAMIN C) 1000 MG tablet Take 1,000 mg by mouth every morning.    [name] aspirin-acetaminophen-caffeine (EXCEDRIN EXTRA STRENGTH) 250-250-65 MG tablet Take 2 tablets by mouth 3 (three) times daily as needed for headache or migraine (pain).    [name] atorvastatin (LIPITOR) 80 MG tablet Take 1 tablet (80 mg total) by mouth every evening. 90 tablet 2  . Calcium Carb-Cholecalciferol (CALCIUM 600 + D PO) Take 1 tablet by mouth every morning.    . cholecalciferol (VITAMIN D) 25 MCG (1000 UNIT) tablet Take 1,000 Units by mouth every morning.    . clobetasol cream (TEMOVATE) 0.05 % Apply to affected area twice daily as needed for up to 2 weeks 30 g 0  .  CRANBERRY EXTRACT PO Take 4,200 mg by mouth 2 (two) times daily.    . DULoxetine (CYMBALTA) 60 MG capsule TAKE ONE CAPSULE BY MOUTH EVERY EVENING 90 capsule 2  . EPINEPHRINE 0.3 mg/0.3 mL IJ SOAJ injection USE AS DIRECTED (Patient taking differently: Inject 0.3 mg into the muscle as needed  for anaphylaxis (severe allergic reaction).) 2 each 2  . esomeprazole (NEXIUM) 20 MG capsule Take 1 capsule (20 mg total) by mouth 2 (two) times daily before a meal. 90 capsule 3  . fluorometholone (FML) 0.1 % ophthalmic suspension Place 1 drop into both eyes daily as needed (dry eyes/irritation).    [name] levocetirizine (XYZAL) 5 MG tablet Take 5 mg by mouth daily as needed (seasonal allergies).    [name] levothyroxine (SYNTHROID) 112 MCG tablet TAKE ONE TABLET BY MOUTH EVERY EVENING 90 tablet 2  . metFORMIN (GLUCOPHAGE-XR) 500 MG 24 hr tablet TAKE ONE TABLET BY MOUTH EVERY EVENING 90 tablet 3  . Multiple Vitamin (MULTIVITAMIN WITH MINERALS) TABS tablet Take 1 tablet by mouth every morning. One a Day for Men over 26    . Polyethyl Glycol-Propyl Glycol (SYSTANE) 0.4-0.3 % SOLN Place 1 drop into both eyes 2 (two) times daily as needed (dry eyes).    . Probiotic Product (PROBIOTIC PO) Take 1 capsule by mouth every morning.    . propranolol (INDERAL) 60 MG tablet TAKE ONE TABLET BY MOUTH EVERY MORNING and TAKE ONE TABLET BY MOUTH EVERY EVENING 180 tablet 4  . testosterone (ANDROGEL) 50 MG/5GM (1%) GEL apply FIVE grams (ONE PACKET) onto THE SKIN TWICE DAILY 300 g 4  . Ubrogepant (UBRELVY) 50 MG TABS Take one at first sign of migraine. May repeat once in 2 hours. (Patient taking differently: Take 50 mg by mouth See admin instructions. Take one tablet (50 mg) by mouth at first sign of migraine; may repeat once in 2 hours if still needed) 10 tablet 2  . zinc gluconate 50 MG tablet Take 50 mg by mouth every morning.     No facility-administered medications prior to visit.    Family History  Problem Relation Age of Onset  . Diabetes Mother   . Hypertension Mother   . Hypertension Father   . Healthy Son     Social History   Socioeconomic History  . Marital status: Married    Spouse name: Not on file  . Number of children: Not on file  . Years of education: Not on file  . Highest education level: Not on  file  Occupational History  . Occupation: Pharmacist, community  Tobacco Use  . Smoking status: Former    Packs/day: 1.00    Years: 6.00    Total pack years: 6.00    Types: Cigarettes    Quit date: 04/25/1966    Years since quitting: 56.2    Passive exposure: Past  . Smokeless tobacco: Never  Vaping Use  . Vaping Use: Never used  Substance and Sexual Activity  . Alcohol use: Yes    Alcohol/week: 7.0 standard drinks of alcohol    Types: 7 Glasses of wine per week  . Drug use: Never  . Sexual activity: Not on file  Other Topics Concern  . Not on file  Social History Narrative  . Not on file   Social Determinants of Health   Financial Resource Strain: Low Risk  (11/07/2021)   Overall Financial Resource Strain (CARDIA)   . Difficulty of Paying Living Expenses: Not hard at all  Food Insecurity: No Food  Insecurity (11/07/2021)   Hunger Vital Sign   . Worried About Charity fundraiser in the Last Year: Never true   . Ran Out of Food in the Last Year: Never true  Transportation Needs: No Transportation Needs (11/07/2021)   PRAPARE - Transportation   . Lack of Transportation (Medical): No   . Lack of Transportation (Non-Medical): No  Physical Activity: Insufficiently Active (11/07/2021)   Exercise Vital Sign   . Days of Exercise per Week: 3 days   . Minutes of Exercise per Session: 30 min  Stress: No Stress Concern Present (11/07/2021)   [place]   . Feeling of Stress : Not at all  Social Connections: Moderately Integrated (11/07/2021)   Social Connection and Isolation Panel [NHANES]   . Frequency of Communication with Friends and Family: Three times a week   . Frequency of Social Gatherings with Friends and Family: Three times a week   . Attends Religious Services: 1 to 4 times per year   . Active Member of Clubs or Organizations: No   . Attends Archivist Meetings: Never   . Marital Status: Married  Arboriculturist Violence: Not At Risk (11/07/2021)   Humiliation, Afraid, Rape, and Kick questionnaire   . Fear of Current or Ex-Partner: No   . Emotionally Abused: No   . Physically Abused: No   . Sexually Abused: No                                                                                                 Objective:  Physical Exam: BP 134/84 (BP Location: Left Arm, Patient Position: Sitting, Cuff Size: Large)   Pulse 88   Temp 97.7 F (36.5 C) (Oral)   Wt 238 lb 3.2 oz (108 kg)   SpO2 94%   BMI 32.31 kg/m    ***General: No acute distress. Awake and conversant.  Eyes: Normal conjunctiva, anicteric. Round symmetric pupils.  ENT: Hearing grossly intact. No nasal discharge.  Neck: Neck is supple. No masses or thyromegaly.  Respiratory: Respirations are non-labored. No auditory wheezing.  Skin: Warm. No rashes or ulcers.  Psych: Alert and oriented. Cooperative, Appropriate mood and affect, Normal judgment.  CV: No cyanosis or JVD MSK: Normal ambulation. No clubbing  Neuro: Sensation and CN II-XII grossly normal.   No results found for any visits on 07/04/22.  Physical Exam       Alesia Banda, MD, MS

## 2022-07-04 NOTE — Patient Instructions (Signed)
Use albuterol as needed. You may also use tessalon perles and flonase.

## 2022-07-07 ENCOUNTER — Telehealth: Payer: Self-pay

## 2022-07-07 ENCOUNTER — Telehealth: Payer: Self-pay | Admitting: Family Medicine

## 2022-07-07 DIAGNOSIS — R059 Cough, unspecified: Secondary | ICD-10-CM | POA: Insufficient documentation

## 2022-07-07 NOTE — Telephone Encounter (Signed)
Left patient a detailed voice message to return call to office for scheduling f/u with Dr. Grandville Silos

## 2022-07-07 NOTE — Telephone Encounter (Signed)
-----   Message from Bonnita Hollow, MD sent at 07/04/2022  5:35 PM EST ----- Can you please ensure patient gets scheduled for me or

## 2022-07-07 NOTE — Telephone Encounter (Signed)
Pt was seen by Dr Grandville Silos for coughing on 07/04/21. Pt is wanting him to know he has a productive cough and his pulse ox Is 93 or above and he's  doing well.   He is also wanting to know if Dr Grandville Silos or another provider in our office is able to give him a steroid shot in his bursitis? Please advise pt at [phone] and ask for [facility].

## 2022-07-07 NOTE — Assessment & Plan Note (Signed)
Patient presents with a persistent cough likely secondary to upper respiratory tract infection symptoms now mostly resolved. The cough is non-productive and not accompanied by significant other respiratory symptoms.  Plan:  Encourage the patient to continue with current therapy, including use of Mucinex DM. Advise the patient to trial the albuterol inhaler at home to assess for any improvement, as it has provided relief in the past. Consider implementation of Flonase (fluticasone) to potentially alleviate any residual nasal inflammation that could be contributing to cough. Monitor SpO2 levels; if readings consistently fall below 90%, patient should seek further medical attention. Arrange for follow-up on the following Monday to reassess and consider other diagnostics if cough persists or condition worsens. Educate the patient on signs of respiratory distress or indication to visit the emergency department sooner if needed. Encourage continuation of hydration and rest to support recovery from recent illness. Taper down exertion levels as needed to allow for recuperation.

## 2022-07-08 ENCOUNTER — Encounter: Payer: Self-pay | Admitting: Family Medicine

## 2022-07-08 ENCOUNTER — Ambulatory Visit (INDEPENDENT_AMBULATORY_CARE_PROVIDER_SITE_OTHER): Payer: Medicare Other | Admitting: Family Medicine

## 2022-07-08 VITALS — BP 136/84 | HR 77 | Temp 97.9°F | Wt 238.8 lb

## 2022-07-08 DIAGNOSIS — M25512 Pain in left shoulder: Secondary | ICD-10-CM | POA: Diagnosis not present

## 2022-07-08 DIAGNOSIS — R051 Acute cough: Secondary | ICD-10-CM

## 2022-07-08 MED ORDER — PREDNISONE 50 MG PO TABS: ORAL_TABLET | ORAL | 0 refills | Status: DC

## 2022-07-08 NOTE — Patient Instructions (Signed)
For shoulder pain, take prednisone as discussed.  You may also use heat and ice as well as attached exercises.

## 2022-07-08 NOTE — Assessment & Plan Note (Signed)
The patient's cough appears to be improving following a bronchitic episode. Will continue symptomatic treatment with the current regimen including albuterol inhaler and Mucinex. Advised to maintain hydration and follow up if symptoms recur or worsen.

## 2022-07-08 NOTE — Progress Notes (Signed)
Assessment/Plan:   Problem List Items Addressed This Visit       Other   Cough    The patient's cough appears to be improving following a bronchitic episode. Will continue symptomatic treatment with the current regimen including albuterol inhaler and Mucinex. Advised to maintain hydration and follow up if symptoms recur or worsen.      Acute pain of left shoulder - Primary    Acute left shoulder pain with limitation of movement is consistent with bursitis or rotator cuff strain, likely triggered by the heavy lifting incident. Prescribed oral prednisone 50 mg for 5 days to address inflammation. Recommended heat application and home exercises for rehabilitation. Provided an option for x-ray imaging to explore potential arthritic changes or other chronic conditions, decision deferred at this time. Advised to return for follow-up if no improvement or if symptoms worsen, at which point x-ray imaging may be pursued.      Relevant Medications   predniSONE (DELTASONE) 50 MG tablet    Medications Discontinued During This Encounter  Medication Reason   predniSONE (DELTASONE) 50 MG tablet Reorder   predniSONE (DELTASONE) 50 MG tablet Reorder      Subjective:  HPI: Encounter date: 07/08/2022  KANNON SCHAUT is a 77 y.o. male who has Sjogren's syndrome ([place]); cutaneous lupus; Idiopathic chronic gout, unspecified site, without tophus (tophi); High risk medication use; Primary osteoarthritis of both hands; DDD (degenerative disc disease), lumbar; Kidney disease; Gout of multiple sites; Chronic renal impairment; Chronic kidney disease; NSTEMI (non-ST elevated myocardial infarction) ([place]); CAD S/P percutaneous coronary angioplasty; Obesity (BMI 30.0-34.9); Cyst of tonsil; Androgen deficiency; Hypothyroidism; Nonintractable headache; History of gout; Elevated cholesterol; Healthcare maintenance; Gastroesophageal reflux disease; Allergy to honey bee venom; Unstable angina ([place]); Chest pain;  Hospital discharge follow-up; Prediabetes; Mild reactive airways disease; Need for influenza vaccination; Herpes labialis; Cough; and Acute pain of left shoulder on their problem list..   He  has a past medical history of CAD S/P percutaneous coronary angioplasty (12/12/2016), CKD (chronic kidney disease), stage III ([place]), GERD (gastroesophageal reflux disease), Gout, Hyperlipidemia LDL goal <70, Hypertension, Hypothyroidism, Migraine, Osteoarthritis, Sjogren's disease ([place]), and Systemic lupus erythematosus ([place])..   CHIEF COMPLAINT: Patient presents for follow-up with complaints of cough and left shoulder pain.  HISTORY OF PRESENT ILLNESS: Cough. The patient reports improvement in cough symptoms following a recent episode of a likely viral bronchitis. While the cough is not completely resolved, expectoration has begun, suggesting productive cough. He continued using an albuterol inhaler, which he found helpful. The patient has also used Nasonex and stayed on Mucinex. No use of benzonatate was reported as the patient felt it was no longer necessary after the cough started to break up.  Bursitis. The patient is experiencing left shoulder pain that has worsened following an incident where he lifted a 5-gallon water tank. History of similar pain about a decade ago related to playing tennis is noted. Current pain impedes his ability to lift the arm without significant discomfort, which affects his daily activities and work. Despite the ongoing use of Excedrin for headaches, it also serves as his typical analgesic; however, it has not ameliorated the shoulder pain.  ROS:  Respiratory: Reports a productive cough; no wheezing noted. Musculoskeletal: Pain localized to the left shoulder, with restricted range of motion.  Past Surgical History:  Procedure Laterality Date   CATARACT EXTRACTION, BILATERAL Bilateral 2017   COLONOSCOPY  2021   CORONARY STENT INTERVENTION N/A 12/12/2016   Procedure: Coronary  Stent Intervention;  Surgeon:  Martinique, Peter M, MD;  Location: [facility];  Service: Cardiovascular;  Laterality: N/A;   HERNIA REPAIR     LEFT HEART CATH AND CORONARY ANGIOGRAPHY N/A 12/12/2016   Procedure: Left Heart Cath and Coronary Angiography;  Surgeon: Martinique, Peter M, MD;  Location: [facility];  Service: Cardiovascular;  Laterality: N/A;   LEFT HEART CATH AND CORONARY ANGIOGRAPHY N/A 03/29/2021   Procedure: LEFT HEART CATH AND CORONARY ANGIOGRAPHY;  Surgeon: Burnell Blanks, MD;  Location: [facility];  Service: Cardiovascular;  Laterality: N/A;   THYROID SURGERY  1997    Outpatient Medications Prior to Visit  Medication Sig Dispense Refill   acyclovir cream (ZOVIRAX) 5 % Apply 1 Application topically every 3 (three) hours. 15 g 5   albuterol (VENTOLIN HFA) 108 (90 Base) MCG/ACT inhaler INHALE 1 PUFF BY MOUTH INTO LUNGS EVERY 6 HOURS AS NEEDED FOR SHORTNESS OF BREATH OR wheezing 8.5 g 3   allopurinol (ZYLOPRIM) 300 MG tablet TAKE ONE TABLET BY MOUTH EVERY EVENING 90 tablet 0   amLODipine (NORVASC) 5 MG tablet TAKE ONE TABLET BY MOUTH ONCE DAILY 90 tablet 3   Ascorbic Acid (VITAMIN C) 1000 MG tablet Take 1,000 mg by mouth every morning.     aspirin-acetaminophen-caffeine (EXCEDRIN EXTRA STRENGTH) 250-250-65 MG tablet Take 2 tablets by mouth 3 (three) times daily as needed for headache or migraine (pain).     atorvastatin (LIPITOR) 80 MG tablet Take 1 tablet (80 mg total) by mouth every evening. 90 tablet 2   Calcium Carb-Cholecalciferol (CALCIUM 600 + D PO) Take 1 tablet by mouth every morning.     cholecalciferol (VITAMIN D) 25 MCG (1000 UNIT) tablet Take 1,000 Units by mouth every morning.     clobetasol cream (TEMOVATE) 0.05 % Apply to affected area twice daily as needed for up to 2 weeks 30 g 0   CRANBERRY EXTRACT PO Take 4,200 mg by mouth 2 (two) times daily.     DULoxetine (CYMBALTA) 60 MG capsule TAKE ONE CAPSULE BY MOUTH EVERY EVENING 90 capsule 2    EPINEPHRINE 0.3 mg/0.3 mL IJ SOAJ injection USE AS DIRECTED (Patient taking differently: Inject 0.3 mg into the muscle as needed for anaphylaxis (severe allergic reaction).) 2 each 2   esomeprazole (NEXIUM) 20 MG capsule Take 1 capsule (20 mg total) by mouth 2 (two) times daily before a meal. 90 capsule 3   fluorometholone (FML) 0.1 % ophthalmic suspension Place 1 drop into both eyes daily as needed (dry eyes/irritation).     levocetirizine (XYZAL) 5 MG tablet Take 5 mg by mouth daily as needed (seasonal allergies).     levothyroxine (SYNTHROID) 112 MCG tablet TAKE ONE TABLET BY MOUTH EVERY EVENING 90 tablet 2   metFORMIN (GLUCOPHAGE-XR) 500 MG 24 hr tablet TAKE ONE TABLET BY MOUTH EVERY EVENING 90 tablet 3   Multiple Vitamin (MULTIVITAMIN WITH MINERALS) TABS tablet Take 1 tablet by mouth every morning. One a Day for Men over 50     Polyethyl Glycol-Propyl Glycol (SYSTANE) 0.4-0.3 % SOLN Place 1 drop into both eyes 2 (two) times daily as needed (dry eyes).     Probiotic Product (PROBIOTIC PO) Take 1 capsule by mouth every morning.     propranolol (INDERAL) 60 MG tablet TAKE ONE TABLET BY MOUTH EVERY MORNING and TAKE ONE TABLET BY MOUTH EVERY EVENING 180 tablet 4   testosterone (ANDROGEL) 50 MG/5GM (1%) GEL apply FIVE grams (ONE PACKET) onto THE SKIN TWICE DAILY 300 g 4  Ubrogepant (UBRELVY) 50 MG TABS Take one at first sign of migraine. May repeat once in 2 hours. (Patient taking differently: Take 50 mg by mouth See admin instructions. Take one tablet (50 mg) by mouth at first sign of migraine; may repeat once in 2 hours if still needed) 10 tablet 2   zinc gluconate 50 MG tablet Take 50 mg by mouth every morning.     benzonatate (TESSALON) 100 MG capsule Take 1 capsule (100 mg total) by mouth 2 (two) times daily as needed for cough. (Patient not taking: Reported on 07/08/2022) 20 capsule 0   No facility-administered medications prior to visit.    Family History  Problem Relation Age of Onset    Diabetes Mother    Hypertension Mother    Hypertension Father    Healthy Son     Social History   Socioeconomic History   Marital status: Married    Spouse name: Not on file   Number of children: Not on file   Years of education: Not on file   Highest education level: Not on file  Occupational History   Occupation: Dentist  Tobacco Use   Smoking status: Former    Packs/day: 1.00    Years: 6.00    Total pack years: 6.00    Types: Cigarettes    Quit date: 04/25/1966    Years since quitting: 56.2    Passive exposure: Past   Smokeless tobacco: Never  Vaping Use   Vaping Use: Never used  Substance and Sexual Activity   Alcohol use: Yes    Alcohol/week: 7.0 standard drinks of alcohol    Types: 7 Glasses of wine per week   Drug use: Never   Sexual activity: Not on file  Other Topics Concern   Not on file  Social History Narrative   Not on file   Social Determinants of Health   Financial Resource Strain: Low Risk  (11/07/2021)   Overall Financial Resource Strain (CARDIA)    Difficulty of Paying Living Expenses: Not hard at all  Food Insecurity: No Food Insecurity (11/07/2021)   Hunger Vital Sign    Worried About Running Out of Food in the Last Year: Never true    Ran Out of Food in the Last Year: Never true  Transportation Needs: No Transportation Needs (11/07/2021)   PRAPARE - Hydrologist (Medical): No    Lack of Transportation (Non-Medical): No  Physical Activity: Insufficiently Active (11/07/2021)   Exercise Vital Sign    Days of Exercise per Week: 3 days    Minutes of Exercise per Session: 30 min  Stress: No Stress Concern Present (11/07/2021)   [place]    Feeling of Stress : Not at all  Social Connections: Moderately Integrated (11/07/2021)   Social Connection and Isolation Panel [NHANES]    Frequency of Communication with Friends and Family: Three times a week     Frequency of Social Gatherings with Friends and Family: Three times a week    Attends Religious Services: 1 to 4 times per year    Active Member of Clubs or Organizations: No    Attends Archivist Meetings: Never    Marital Status: Married  Human resources officer Violence: Not At Risk (11/07/2021)   Humiliation, Afraid, Rape, and Kick questionnaire    Fear of Current or Ex-Partner: No    Emotionally Abused: No    Physically Abused: No    Sexually Abused:  No                                                                                                 Objective:  Physical Exam: BP 136/84 (BP Location: Left Arm, Patient Position: Sitting, Cuff Size: Large)   Pulse 77   Temp 97.9 F (36.6 C) (Temporal)   Wt 238 lb 12.8 oz (108.3 kg)   SpO2 94%   BMI 32.39 kg/m    General: No acute distress. Awake and conversant.  Eyes: Normal conjunctiva, anicteric. Round symmetric pupils.  ENT: Hearing grossly intact. No nasal discharge.  Neck: Neck is supple. No masses or thyromegaly.  Respiratory: Respirations are non-labored. No auditory wheezing.  Skin: Warm. No rashes or ulcers.  Psych: Alert and oriented. Cooperative, Appropriate mood and affect, Normal judgment.  CV: No cyanosis or JVD MSK: Pain on movement of left shoulder, reduced range of motion. Normal ambulation. No clubbing  Neuro: Sensation and CN II-XII grossly normal.       Alesia Banda, MD, MS

## 2022-07-08 NOTE — Assessment & Plan Note (Signed)
Acute left shoulder pain with limitation of movement is consistent with bursitis or rotator cuff strain, likely triggered by the heavy lifting incident. Prescribed oral prednisone 50 mg for 5 days to address inflammation. Recommended heat application and home exercises for rehabilitation. Provided an option for x-ray imaging to explore potential arthritic changes or other chronic conditions, decision deferred at this time. Advised to return for follow-up if no improvement or if symptoms worsen, at which point x-ray imaging may be pursued.

## 2022-07-11 DIAGNOSIS — N183 Chronic kidney disease, stage 3 unspecified: Secondary | ICD-10-CM | POA: Diagnosis not present

## 2022-07-11 DIAGNOSIS — I129 Hypertensive chronic kidney disease with stage 1 through stage 4 chronic kidney disease, or unspecified chronic kidney disease: Secondary | ICD-10-CM | POA: Diagnosis not present

## 2022-07-15 ENCOUNTER — Telehealth: Payer: Self-pay | Admitting: Rheumatology

## 2022-07-15 NOTE — Telephone Encounter (Signed)
Patient states he injured his left shoulder a couple of weeks ago and saw PCP who prescribed Prednisone dose pack which did help.  Patient states he feels like the pain is now chronic and thinks a cortisone injection might help with pain relief.  Patient states he thinks he may have a minor tear, but would prefer to have an injection every 6 months or so at our office instead of involving orthopedics.  Please advise.

## 2022-07-15 NOTE — Telephone Encounter (Signed)
I would recommend evaluation by an orthopedic surgeon to make sure there is no tear instead of proceeding with the injection after the injury.

## 2022-07-15 NOTE — Telephone Encounter (Signed)
Patient advised Dr. Estanislado Pandy would recommend evaluation by an orthopedic surgeon to make sure there is no tear instead of proceeding with the injection after the injury. Patient expressed understanding.

## 2022-07-18 DIAGNOSIS — M25512 Pain in left shoulder: Secondary | ICD-10-CM | POA: Diagnosis not present

## 2022-07-21 ENCOUNTER — Telehealth: Payer: Self-pay | Admitting: Family Medicine

## 2022-07-21 NOTE — Telephone Encounter (Signed)
Patient requested  document Handicap Placard, to be filled out by provider. Patient requested to send it via Call Patient to pick up within 5-days. Document is located in providers tray at front office.  Call back # [phone] cell  Or office (ok to speak with wife Thayer Headings) [phone]

## 2022-07-23 NOTE — Telephone Encounter (Signed)
Pt picked up paperwork.

## 2022-07-23 NOTE — Telephone Encounter (Signed)
Patient and wife aware that form completed and ready for pick up.

## 2022-07-29 DIAGNOSIS — M25512 Pain in left shoulder: Secondary | ICD-10-CM | POA: Diagnosis not present

## 2022-08-07 ENCOUNTER — Other Ambulatory Visit: Payer: Self-pay | Admitting: Cardiology

## 2022-08-13 ENCOUNTER — Other Ambulatory Visit: Payer: Self-pay | Admitting: Physician Assistant

## 2022-08-13 NOTE — Telephone Encounter (Signed)
Last Fill: 05/15/2022  Labs: 06/27/2022 Hgb and hct are borderline elevated. Rest of CBC WNL. Uric acid WNL.   Next Visit: 01/09/2023  Last Visit: 06/27/2022  DX: Sjogren's syndrome with other organ involvement   Current Dose per office note 06/27/2022: allopurinol 300 mg 1 tablet by mouth daily   Okay to refill Allopurinol?

## 2022-08-15 DIAGNOSIS — M75122 Complete rotator cuff tear or rupture of left shoulder, not specified as traumatic: Secondary | ICD-10-CM | POA: Diagnosis not present

## 2022-09-04 DIAGNOSIS — M75122 Complete rotator cuff tear or rupture of left shoulder, not specified as traumatic: Secondary | ICD-10-CM | POA: Diagnosis not present

## 2022-09-19 ENCOUNTER — Other Ambulatory Visit: Payer: Self-pay | Admitting: Cardiology

## 2022-09-24 ENCOUNTER — Telehealth: Payer: Self-pay | Admitting: Cardiology

## 2022-09-24 NOTE — Telephone Encounter (Signed)
Left message for patient to call back. NTG was removed from med list in 2022. Would need to know if he is having symptoms or just requests a refill to have on hand. Will await call back.

## 2022-09-24 NOTE — Telephone Encounter (Signed)
[facility] called in on behalf of pt, she states pt is asking for them to fill Nitrostat. Did not see on his med list, please advise if this can be added and sent over to [facility].

## 2022-09-25 NOTE — Telephone Encounter (Signed)
LVM to please call office

## 2022-09-26 NOTE — Telephone Encounter (Signed)
LVM to please call office to clarify information

## 2022-09-29 NOTE — Telephone Encounter (Signed)
Call to spouse and left message for the fourth time with no answer or return call.  Call to wife's number and LM with her as well. Call to pharmacy to notify them that unable to reach patient. \She states they sent the medication out to the patient o the 10th without our call back.  Nothing further needed

## 2022-10-07 ENCOUNTER — Other Ambulatory Visit: Payer: Self-pay | Admitting: Family Medicine

## 2022-10-07 DIAGNOSIS — R7303 Prediabetes: Secondary | ICD-10-CM

## 2022-10-14 DIAGNOSIS — I129 Hypertensive chronic kidney disease with stage 1 through stage 4 chronic kidney disease, or unspecified chronic kidney disease: Secondary | ICD-10-CM | POA: Diagnosis not present

## 2022-10-14 DIAGNOSIS — S62502B Fracture of unspecified phalanx of left thumb, initial encounter for open fracture: Secondary | ICD-10-CM

## 2022-10-14 DIAGNOSIS — Z9889 Other specified postprocedural states: Secondary | ICD-10-CM | POA: Diagnosis not present

## 2022-10-14 DIAGNOSIS — S63125A Dislocation of unspecified interphalangeal joint of left thumb, initial encounter: Secondary | ICD-10-CM | POA: Diagnosis not present

## 2022-10-14 DIAGNOSIS — M19042 Primary osteoarthritis, left hand: Secondary | ICD-10-CM | POA: Diagnosis not present

## 2022-10-14 DIAGNOSIS — N189 Chronic kidney disease, unspecified: Secondary | ICD-10-CM | POA: Diagnosis not present

## 2022-10-14 DIAGNOSIS — E1122 Type 2 diabetes mellitus with diabetic chronic kidney disease: Secondary | ICD-10-CM | POA: Diagnosis not present

## 2022-10-14 HISTORY — DX: Fracture of unspecified phalanx of left thumb, initial encounter for open fracture: S62.502B

## 2022-10-16 DIAGNOSIS — M79645 Pain in left finger(s): Secondary | ICD-10-CM | POA: Diagnosis not present

## 2022-10-16 DIAGNOSIS — M25649 Stiffness of unspecified hand, not elsewhere classified: Secondary | ICD-10-CM | POA: Diagnosis not present

## 2022-10-24 DIAGNOSIS — H43813 Vitreous degeneration, bilateral: Secondary | ICD-10-CM | POA: Diagnosis not present

## 2022-10-24 DIAGNOSIS — M79645 Pain in left finger(s): Secondary | ICD-10-CM | POA: Diagnosis not present

## 2022-10-24 DIAGNOSIS — H35383 Toxic maculopathy, bilateral: Secondary | ICD-10-CM | POA: Diagnosis not present

## 2022-11-12 ENCOUNTER — Other Ambulatory Visit: Payer: Self-pay | Admitting: Physician Assistant

## 2022-11-12 NOTE — Telephone Encounter (Signed)
Last Fill: 08/13/2022  Labs: 06/27/2022 Uric acid WNL. Complements WNL. Protein creatinine ratio remains slightly elevated.  Hgb and hct are borderline elevated. Rest of CBC WNL.   Next Visit: 01/09/2023  Last Visit: 06/27/2022  DX: Sjogren's syndrome with other organ involvement   Current Dose per office note 06/27/2022:  allopurinol 300 mg 1 tablet by mouth daily   Okay to refill Allopurinol?

## 2022-11-21 ENCOUNTER — Ambulatory Visit (INDEPENDENT_AMBULATORY_CARE_PROVIDER_SITE_OTHER): Payer: Medicare Other | Admitting: Family Medicine

## 2022-11-21 ENCOUNTER — Encounter: Payer: Self-pay | Admitting: Family Medicine

## 2022-11-21 VITALS — BP 126/80 | HR 91 | Temp 97.6°F | Ht 72.0 in | Wt 228.0 lb

## 2022-11-21 DIAGNOSIS — K219 Gastro-esophageal reflux disease without esophagitis: Secondary | ICD-10-CM | POA: Diagnosis not present

## 2022-11-21 DIAGNOSIS — R197 Diarrhea, unspecified: Secondary | ICD-10-CM

## 2022-11-21 LAB — COMPREHENSIVE METABOLIC PANEL
ALT: 17 U/L (ref 0–53)
AST: 19 U/L (ref 0–37)
Albumin: 3.6 g/dL (ref 3.5–5.2)
Alkaline Phosphatase: 82 U/L (ref 39–117)
BUN: 16 mg/dL (ref 6–23)
CO2: 32 mEq/L (ref 19–32)
Calcium: 9 mg/dL (ref 8.4–10.5)
Chloride: 101 mEq/L (ref 96–112)
Creatinine, Ser: 1.31 mg/dL (ref 0.40–1.50)
GFR: 52.17 mL/min — ABNORMAL LOW (ref >60.00)
Glucose, Bld: 98 mg/dL (ref 70–99)
Potassium: 4.4 mEq/L (ref 3.5–5.1)
Sodium: 140 mEq/L (ref 135–145)
Total Bilirubin: 0.5 mg/dL (ref 0.2–1.2)
Total Protein: 7.1 g/dL (ref 6.0–8.3)

## 2022-11-21 LAB — CBC WITH DIFFERENTIAL/PLATELET
Basophils Absolute: 0 10*3/uL (ref 0.0–0.1)
Basophils Relative: 0.4 % (ref 0.0–3.0)
Eosinophils Absolute: 0.2 10*3/uL (ref 0.0–0.7)
Eosinophils Relative: 1.8 % (ref 0.0–5.0)
HCT: 49.9 % (ref 39.0–52.0)
Hemoglobin: 15.4 g/dL (ref 13.0–17.0)
Lymphocytes Relative: 13.3 % (ref 12.0–46.0)
Lymphs Abs: 1.3 10*3/uL (ref 0.7–4.0)
MCHC: 30.9 g/dL (ref 30.0–36.0)
MCV: 95.2 fl (ref 78.0–100.0)
Monocytes Absolute: 1.1 10*3/uL — ABNORMAL HIGH (ref 0.1–1.0)
Monocytes Relative: 11.8 % (ref 3.0–12.0)
Neutro Abs: 7 10*3/uL (ref 1.4–7.7)
Neutrophils Relative %: 72.7 % (ref 43.0–77.0)
Platelets: 279 10*3/uL (ref 150.0–400.0)
RBC: 5.24 Mil/uL (ref 4.22–5.81)
RDW: 16.9 % — ABNORMAL HIGH (ref 11.5–15.5)
WBC: 9.6 10*3/uL (ref 4.0–10.5)

## 2022-11-21 NOTE — Progress Notes (Signed)
Established Patient Office Visit   Subjective:  Patient ID: Dennis Moon, male    DOB: [birth year]  Age: 77 y.o. MRN: [id number]  Chief Complaint  Patient presents with   Diarrhea    Diarrhea x 5 weeks. Pt states he goes to th bathroom 3-4 daily. Very sluggish, no energy, no appetite and weight loss.     Diarrhea  Associated symptoms include weight loss. Pertinent negatives include no abdominal pain, chills, fever or myalgias.   Encounter Diagnoses  Name Primary?   Diarrhea, unspecified type Yes   Presents with a 30-day history of watery alternating with loose diarrhea.  He denies fevers chills, blood in his stool or melena.  There has been weight loss and decreased appetite.  Approximately 5 days prior to the onset of above symptoms he fell in a bathroom while visiting relatives in [place].  He sustained an open dislocation of his left first PIP.  It was reduced and the wound was sutured at an emergency room.  He was placed on clindamycin and Augmentin.  He was able to see his orthopedic surgeon here in [place] a few days later.  Clindamycin was discontinued after 2 days.  He completed a 5-day course of Augmentin.   Review of Systems  Constitutional:  Positive for weight loss. Negative for chills, fever and malaise/fatigue.  HENT: Negative.    Eyes:  Negative for blurred vision, discharge and redness.  Respiratory: Negative.    Cardiovascular: Negative.   Gastrointestinal:  Positive for diarrhea. Negative for abdominal pain, blood in stool, constipation and melena.  Genitourinary: Negative.   Musculoskeletal: Negative.  Negative for myalgias.  Skin:  Negative for rash.  Neurological:  Negative for tingling, loss of consciousness and weakness.  Endo/Heme/Allergies:  Negative for polydipsia.     Current Outpatient Medications:    acyclovir cream (ZOVIRAX) 5 %, Apply 1 Application topically every 3 (three) hours., Disp: 15 g, Rfl: 5   albuterol (VENTOLIN HFA) 108 (90 Base)  MCG/ACT inhaler, INHALE 1 PUFF BY MOUTH INTO LUNGS EVERY 6 HOURS AS NEEDED FOR SHORTNESS OF BREATH OR wheezing, Disp: 8.5 g, Rfl: 3   allopurinol (ZYLOPRIM) 300 MG tablet, TAKE ONE TABLET BY MOUTH EVERY EVENING, Disp: 90 tablet, Rfl: 0   amLODipine (NORVASC) 5 MG tablet, TAKE ONE TABLET BY MOUTH ONCE DAILY, Disp: 90 tablet, Rfl: 3   Ascorbic Acid (VITAMIN C) 1000 MG tablet, Take 1,000 mg by mouth every morning., Disp: , Rfl:    aspirin-acetaminophen-caffeine (EXCEDRIN EXTRA STRENGTH) 250-250-65 MG tablet, Take 2 tablets by mouth 3 (three) times daily as needed for headache or migraine (pain)., Disp: , Rfl:    atorvastatin (LIPITOR) 80 MG tablet, TAKE ONE TABLET BY MOUTH EVERY EVENING, Disp: 90 tablet, Rfl: 1   Calcium Carb-Cholecalciferol (CALCIUM 600 + D PO), Take 1 tablet by mouth every morning., Disp: , Rfl:    cholecalciferol (VITAMIN D) 25 MCG (1000 UNIT) tablet, Take 1,000 Units by mouth every morning., Disp: , Rfl:    clobetasol cream (TEMOVATE) 0.05 %, Apply to affected area twice daily as needed for up to 2 weeks, Disp: 30 g, Rfl: 0   CRANBERRY EXTRACT PO, Take 4,200 mg by mouth 2 (two) times daily., Disp: , Rfl:    DULoxetine (CYMBALTA) 60 MG capsule, TAKE ONE CAPSULE BY MOUTH EVERY EVENING, Disp: 90 capsule, Rfl: 2   EPINEPHRINE 0.3 mg/0.3 mL IJ SOAJ injection, USE AS DIRECTED (Patient taking differently: Inject 0.3 mg into the muscle as needed for anaphylaxis (  severe allergic reaction).), Disp: 2 each, Rfl: 2   esomeprazole (NEXIUM) 20 MG capsule, Take 1 capsule (20 mg total) by mouth 2 (two) times daily before a meal., Disp: 90 capsule, Rfl: 3   fluorometholone (FML) 0.1 % ophthalmic suspension, Place 1 drop into both eyes daily as needed (dry eyes/irritation)., Disp: , Rfl:    levocetirizine (XYZAL) 5 MG tablet, Take 5 mg by mouth daily as needed (seasonal allergies)., Disp: , Rfl:    levothyroxine (SYNTHROID) 112 MCG tablet, TAKE ONE TABLET BY MOUTH EVERY EVENING, Disp: 90 tablet, Rfl:  2   metFORMIN (GLUCOPHAGE-XR) 500 MG 24 hr tablet, TAKE ONE TABLET BY MOUTH EVERY EVENING, Disp: 90 tablet, Rfl: 3   Multiple Vitamin (MULTIVITAMIN WITH MINERALS) TABS tablet, Take 1 tablet by mouth every morning. One a Day for Men over 50, Disp: , Rfl:    nitroGLYCERIN (NITROSTAT) 0.4 MG SL tablet, DISSOLVE 1 TABLET UNDER THE TONGUE EVERY 5 MINUTES AS NEEDED FOR CHEST PAIN. DO NOT EXCEED A TOTAL OF 3 DOSES IN 15 MINUTES., Disp: 25 tablet, Rfl: 11   Polyethyl Glycol-Propyl Glycol (SYSTANE) 0.4-0.3 % SOLN, Place 1 drop into both eyes 2 (two) times daily as needed (dry eyes)., Disp: , Rfl:    predniSONE (DELTASONE) 50 MG tablet, Take 1 tablet daily for 5 days., Disp: 5 tablet, Rfl: 0   Probiotic Product (PROBIOTIC PO), Take 1 capsule by mouth every morning., Disp: , Rfl:    propranolol (INDERAL) 60 MG tablet, TAKE ONE TABLET BY MOUTH EVERY MORNING and TAKE ONE TABLET BY MOUTH EVERY EVENING, Disp: 180 tablet, Rfl: 4   testosterone (ANDROGEL) 50 MG/5GM (1%) GEL, apply FIVE grams (ONE PACKET) onto THE SKIN TWICE DAILY, Disp: 300 g, Rfl: 4   Ubrogepant (UBRELVY) 50 MG TABS, Take one at first sign of migraine. May repeat once in 2 hours. (Patient taking differently: Take 50 mg by mouth See admin instructions. Take one tablet (50 mg) by mouth at first sign of migraine; may repeat once in 2 hours if still needed), Disp: 10 tablet, Rfl: 2   zinc gluconate 50 MG tablet, Take 50 mg by mouth every morning., Disp: , Rfl:    benzonatate (TESSALON) 100 MG capsule, Take 1 capsule (100 mg total) by mouth 2 (two) times daily as needed for cough., Disp: 20 capsule, Rfl: 0   Objective:     BP 126/80   Pulse 91   Temp 97.6 F (36.4 C)   Ht 6' (1.829 m)   Wt 228 lb (103.4 kg)   SpO2 96%   BMI 30.92 kg/m  Wt Readings from Last 3 Encounters:  11/21/22 228 lb (103.4 kg)  07/08/22 238 lb 12.8 oz (108.3 kg)  07/04/22 238 lb 3.2 oz (108 kg)      Physical Exam Constitutional:      General: He is not in acute  distress.    Appearance: Normal appearance. He is not ill-appearing, toxic-appearing or diaphoretic.  HENT:     Head: Normocephalic and atraumatic.     Right Ear: External ear normal.     Left Ear: External ear normal.     Mouth/Throat:     Mouth: Mucous membranes are dry.     Pharynx: Oropharynx is clear. No oropharyngeal exudate or posterior oropharyngeal erythema.     Comments: History of Sjogren's Eyes:     General: No scleral icterus.       Right eye: No discharge.        Left eye: No  discharge.     Extraocular Movements: Extraocular movements intact.     Conjunctiva/sclera: Conjunctivae normal.     Pupils: Pupils are equal, round, and reactive to light.  Cardiovascular:     Rate and Rhythm: Normal rate and regular rhythm.  Pulmonary:     Effort: Pulmonary effort is normal. No respiratory distress.     Breath sounds: Normal breath sounds.  Abdominal:     General: Bowel sounds are normal. There is no distension.     Tenderness: There is no abdominal tenderness. There is no right CVA tenderness, left CVA tenderness, guarding or rebound.  Musculoskeletal:     Cervical back: No rigidity or tenderness.  Skin:    General: Skin is warm and dry.  Neurological:     Mental Status: He is alert and oriented to person, place, and time.  Psychiatric:        Mood and Affect: Mood normal.        Behavior: Behavior normal.      No results found for any visits on 11/21/22.    The ASCVD Risk score (Arnett DK, et al., 2019) failed to calculate for the following reasons:   The patient has a prior MI or stroke diagnosis    Assessment & Plan:   Diarrhea, unspecified type -     CBC with Differential/Platelet -     Comprehensive metabolic panel -     Fecal lactoferrin, quant -     GI Profile, Stool, PCR    Return Follow-up next week..  Stool or GI profile which will also test for C. difficile.  Hydrate well.  Mliss Sax, MD

## 2022-11-22 LAB — GI PROFILE, STOOL, PCR
Adenovirus F 40/41: NOT DETECTED
Astrovirus: NOT DETECTED
C difficile toxin A/B: DETECTED — AB
Campylobacter: NOT DETECTED
Cryptosporidium: NOT DETECTED
Cyclospora cayetanensis: NOT DETECTED
Entamoeba histolytica: NOT DETECTED
Enteroaggregative E coli: NOT DETECTED
Enteropathogenic E coli: NOT DETECTED
Enterotoxigenic E coli: DETECTED — AB
Giardia lamblia: NOT DETECTED
Norovirus GI/GII: NOT DETECTED
Plesiomonas shigelloides: NOT DETECTED
Rotavirus A: NOT DETECTED
Salmonella: NOT DETECTED
Sapovirus: NOT DETECTED
Shiga-toxin-producing E coli: NOT DETECTED
Shigella/Enteroinvasive E coli: NOT DETECTED
Vibrio cholerae: NOT DETECTED
Vibrio: NOT DETECTED
Yersinia enterocolitica: NOT DETECTED

## 2022-11-24 ENCOUNTER — Other Ambulatory Visit: Payer: Self-pay | Admitting: Family Medicine

## 2022-11-24 DIAGNOSIS — A0472 Enterocolitis due to Clostridium difficile, not specified as recurrent: Secondary | ICD-10-CM

## 2022-11-24 LAB — FECAL LACTOFERRIN, QUANT
Fecal Lactoferrin: POSITIVE — AB
MICRO NUMBER:: 15164864
SPECIMEN QUALITY:: ADEQUATE

## 2022-11-24 MED ORDER — VANCOMYCIN HCL 125 MG PO CAPS
125.00 mg | ORAL_CAPSULE | Freq: Four times a day (QID) | ORAL | 0 refills | Status: AC
Start: 2022-11-24 — End: 2022-12-04

## 2022-11-24 NOTE — Progress Notes (Signed)
Component Ref Range & Units 3 d ago  Campylobacter Not Detected Not Detected  C difficile toxin A/B Not Detected Detected Abnormal   Plesiomonas shigelloides Not Detected Not Detected  Salmonella Not Detected Not Detected  Vibrio Not Detected Not Detected  Vibrio cholerae Not Detected Not Detected  Yersinia enterocolitica Not Detected Not Detected  Enteroaggregative E coli Not Detected Not Detected  Enteropathogenic E coli Not Detected Not Detected  Enterotoxigenic E coli Not Detected Detected Abnormal   Shiga-toxin-producing E coli Not Detected Not Detected  E coli O157 Not Detected Not applicable  Shigella/Enteroinvasive E coli Not Detected Not Detected  Cryptosporidium Not Detected Not Detected  Cyclospora cayetanensis Not Detected Not Detected  Entamoeba histolytica Not Detected Not Detected  Giardia lamblia Not Detected Not Detected  Adenovirus F 40/41 Not Detected Not Detected  Astrovirus Not Detected Not Detected  Norovirus GI/GII Not Detected Not Detected  Rotavirus A Not Detected Not Detected  Sapovirus Not Detected Not Detected   1. C. difficile diarrhea  - vancomycin (VANCOCIN) 125 MG capsule; Take 1 capsule (125 mg total) by mouth 4 (four) times daily for 10 days.  Dispense: 40 capsule; Refill: 0

## 2022-12-03 ENCOUNTER — Other Ambulatory Visit: Payer: Self-pay | Admitting: Family Medicine

## 2022-12-05 DIAGNOSIS — M79645 Pain in left finger(s): Secondary | ICD-10-CM | POA: Diagnosis not present

## 2022-12-11 ENCOUNTER — Other Ambulatory Visit: Payer: Self-pay | Admitting: Family Medicine

## 2022-12-11 DIAGNOSIS — R519 Headache, unspecified: Secondary | ICD-10-CM

## 2022-12-19 ENCOUNTER — Ambulatory Visit: Payer: Medicare Other

## 2022-12-19 VITALS — BP 108/60 | HR 87 | Temp 97.9°F | Ht 71.5 in | Wt 230.0 lb

## 2022-12-19 DIAGNOSIS — Z0000 Encounter for general adult medical examination without abnormal findings: Secondary | ICD-10-CM | POA: Diagnosis not present

## 2022-12-19 NOTE — Progress Notes (Signed)
Subjective:   Dennis Moon is a 77 y.o. male who presents for Medicare Annual/Subsequent preventive examination.  Visit Complete: In person    Review of Systems     Cardiac Risk Factors include: advanced age (>28men, >58 women);male gender;obesity (BMI >30kg/m2)     Objective:    Today's Vitals   12/19/22 1304  BP: 108/60  Pulse: 87  Temp: 97.9 F (36.6 C)  TempSrc: Oral  SpO2: 95%  Weight: 230 lb (104.3 kg)  Height: 5' 11.5" (1.816 m)   Body mass index is 31.63 kg/m.     12/19/2022    1:17 PM 11/07/2021    2:08 PM 03/29/2021   11:12 AM 11/02/2019    8:17 AM 12/12/2016    2:20 AM  Advanced Directives  Does Patient Have a Medical Advance Directive? Yes Yes No Yes Yes  Type of Estate agent of [place];Living will Healthcare Power of [place];Living will  Healthcare Power of [place];Living will Living will  Does patient want to make changes to medical advance directive?     No - Patient declined  Copy of Healthcare Power of Attorney in Chart? No - copy requested No - copy requested  No - copy requested     Current Medications (verified) Outpatient Encounter Medications as of 12/19/2022  Medication Sig   acyclovir cream (ZOVIRAX) 5 % Apply 1 Application topically every 3 (three) hours.   albuterol (VENTOLIN HFA) 108 (90 Base) MCG/ACT inhaler INHALE 1 PUFF BY MOUTH INTO LUNGS EVERY 6 HOURS AS NEEDED FOR SHORTNESS OF BREATH OR wheezing   allopurinol (ZYLOPRIM) 300 MG tablet TAKE ONE TABLET BY MOUTH EVERY EVENING   amLODipine (NORVASC) 5 MG tablet TAKE ONE TABLET BY MOUTH ONCE DAILY   Ascorbic Acid (VITAMIN C) 1000 MG tablet Take 1,000 mg by mouth every morning.   aspirin-acetaminophen-caffeine (EXCEDRIN EXTRA STRENGTH) 250-250-65 MG tablet Take 2 tablets by mouth 3 (three) times daily as needed for headache or migraine (pain).   atorvastatin (LIPITOR) 80 MG tablet TAKE ONE TABLET BY MOUTH EVERY EVENING   Calcium Carb-Cholecalciferol (CALCIUM 600  + D PO) Take 1 tablet by mouth every morning.   cholecalciferol (VITAMIN D) 25 MCG (1000 UNIT) tablet Take 1,000 Units by mouth every morning.   CRANBERRY EXTRACT PO Take 4,200 mg by mouth 2 (two) times daily.   DULoxetine (CYMBALTA) 60 MG capsule TAKE ONE CAPSULE BY MOUTH EVERY EVENING   EPINEPHRINE 0.3 mg/0.3 mL IJ SOAJ injection USE AS DIRECTED (Patient taking differently: Inject 0.3 mg into the muscle as needed for anaphylaxis (severe allergic reaction).)   esomeprazole (NEXIUM) 20 MG capsule Take 1 capsule (20 mg total) by mouth 2 (two) times daily before a meal.   levocetirizine (XYZAL) 5 MG tablet Take 5 mg by mouth daily as needed (seasonal allergies).   levothyroxine (SYNTHROID) 112 MCG tablet TAKE ONE TABLET BY MOUTH EVERY EVENING   metFORMIN (GLUCOPHAGE-XR) 500 MG 24 hr tablet TAKE ONE TABLET BY MOUTH EVERY EVENING   Multiple Vitamin (MULTIVITAMIN WITH MINERALS) TABS tablet Take 1 tablet by mouth every morning. One a Day for Men over 50   nitroGLYCERIN (NITROSTAT) 0.4 MG SL tablet DISSOLVE 1 TABLET UNDER THE TONGUE EVERY 5 MINUTES AS NEEDED FOR CHEST PAIN. DO NOT EXCEED A TOTAL OF 3 DOSES IN 15 MINUTES.   Polyethyl Glycol-Propyl Glycol (SYSTANE) 0.4-0.3 % SOLN Place 1 drop into both eyes 2 (two) times daily as needed (dry eyes).   Probiotic Product (PROBIOTIC PO) Take 1 capsule  by mouth every morning.   propranolol (INDERAL) 60 MG tablet TAKE ONE TABLET BY MOUTH TWICE DAILY   testosterone (ANDROGEL) 50 MG/5GM (1%) GEL apply FIVE grams (ONE PACKET) onto THE SKIN TWICE DAILY   Ubrogepant (UBRELVY) 50 MG TABS Take one at first sign of migraine. May repeat once in 2 hours. (Patient taking differently: Take 50 mg by mouth See admin instructions. Take one tablet (50 mg) by mouth at first sign of migraine; may repeat once in 2 hours if still needed)   zinc gluconate 50 MG tablet Take 50 mg by mouth every morning.   clobetasol cream (TEMOVATE) 0.05 % Apply to affected area twice daily as needed  for up to 2 weeks (Patient not taking: Reported on 12/19/2022)   fluorometholone (FML) 0.1 % ophthalmic suspension Place 1 drop into both eyes daily as needed (dry eyes/irritation). (Patient not taking: Reported on 12/19/2022)   predniSONE (DELTASONE) 50 MG tablet Take 1 tablet daily for 5 days. (Patient not taking: Reported on 12/19/2022)   No facility-administered encounter medications on file as of 12/19/2022.    Allergies (verified) Bee venom   History: Past Medical History:  Diagnosis Date   CAD S/P percutaneous coronary angioplasty 12/12/2016   a. 11/2016: NSTEMI with DES to LAD and DES to LCx; b. MV low risk 2021; c. NSTEMI 11/22, LHC patent stents to LAD/LCx, 40% mLAD, 20% mRCA, stable, med mgmt. Echo 11/22 EF 60-65%, GIDD, mild LAE.   CKD (chronic kidney disease), stage III (HCC)    GERD (gastroesophageal reflux disease)    Gout    Hyperlipidemia LDL goal <70    Hypertension    Hypothyroidism    Migraine    Osteoarthritis    Sjogren's disease (HCC)    Systemic lupus erythematosus (HCC)    Past Surgical History:  Procedure Laterality Date   CATARACT EXTRACTION, BILATERAL Bilateral 2017   COLONOSCOPY  2021   CORONARY STENT INTERVENTION N/A 12/12/2016   Procedure: Coronary Stent Intervention;  Surgeon: Swaziland, Peter M, MD;  Location: [facility] INVASIVE CV LAB;  Service: Cardiovascular;  Laterality: N/A;   HERNIA REPAIR     LEFT HEART CATH AND CORONARY ANGIOGRAPHY N/A 12/12/2016   Procedure: Left Heart Cath and Coronary Angiography;  Surgeon: Swaziland, Peter M, MD;  Location: [facility] INVASIVE CV LAB;  Service: Cardiovascular;  Laterality: N/A;   LEFT HEART CATH AND CORONARY ANGIOGRAPHY N/A 03/29/2021   Procedure: LEFT HEART CATH AND CORONARY ANGIOGRAPHY;  Surgeon: Kathleene Hazel, MD;  Location: [facility] INVASIVE CV LAB;  Service: Cardiovascular;  Laterality: N/A;   THYROID SURGERY  1997   Family History  Problem Relation Age of Onset   Diabetes Mother    Hypertension Mother    Hypertension  Father    Healthy Son    Social History   Socioeconomic History   Marital status: Married    Spouse name: Not on file   Number of children: Not on file   Years of education: Not on file   Highest education level: Not on file  Occupational History   Occupation: Dentist  Tobacco Use   Smoking status: Former    Current packs/day: 0.00    Average packs/day: 1 pack/day for 6.0 years (6.0 ttl pk-yrs)    Types: Cigarettes    Start date: 04/25/1960    Quit date: 04/25/1966    Years since quitting: 56.6    Passive exposure: Past   Smokeless tobacco: Never  Vaping Use   Vaping status: Never Used  Substance  and Sexual Activity   Alcohol use: Yes    Alcohol/week: 7.0 standard drinks of alcohol    Types: 7 Glasses of wine per week   Drug use: Never   Sexual activity: Not on file  Other Topics Concern   Not on file  Social History Narrative   Not on file   Social Determinants of Health   Financial Resource Strain: Low Risk  (12/19/2022)   Overall Financial Resource Strain (CARDIA)    Difficulty of Paying Living Expenses: Not hard at all  Food Insecurity: No Food Insecurity (12/19/2022)   Hunger Vital Sign    Worried About Running Out of Food in the Last Year: Never true    Ran Out of Food in the Last Year: Never true  Transportation Needs: No Transportation Needs (12/19/2022)   PRAPARE - Administrator, Civil Service (Medical): No    Lack of Transportation (Non-Medical): No  Physical Activity: Insufficiently Active (12/19/2022)   Exercise Vital Sign    Days of Exercise per Week: 2 days    Minutes of Exercise per Session: 20 min  Stress: No Stress Concern Present (12/19/2022)   [employer] of Occupational Health - Occupational Stress Questionnaire    Feeling of Stress : Not at all  Social Connections: Socially Integrated (12/19/2022)   Social Connection and Isolation Panel [NHANES]    Frequency of Communication with Friends and Family: More than three times a week     Frequency of Social Gatherings with Friends and Family: Once a week    Attends Religious Services: More than 4 times per year    Active Member of [employer] or Organizations: Yes    Attends Engineer, structural: More than 4 times per year    Marital Status: Married    Tobacco Counseling Counseling given: Not Answered   Clinical Intake:  Pre-visit preparation completed: Yes  Pain : No/denies pain     Nutritional Status: BMI > 30  Obese Nutritional Risks: None Diabetes: No  How often do you need to have someone help you when you read instructions, pamphlets, or other written materials from your doctor or pharmacy?: 1 - Never  Interpreter Needed?: No  Information entered by :: NAllen LPN   Activities of Daily Living    12/19/2022    1:06 PM  In your present state of health, do you have any difficulty performing the following activities:  Hearing? 1  Comment has hearing aids  Vision? 0  Difficulty concentrating or making decisions? 0  Walking or climbing stairs? 0  Dressing or bathing? 0  Doing errands, shopping? 0  Preparing Food and eating ? N  Using the Toilet? N  In the past six months, have you accidently leaked urine? N  Do you have problems with loss of bowel control? N  Managing your Medications? N  Managing your Finances? N  Housekeeping or managing your Housekeeping? N    Patient Care Team: Mliss Sax, MD as PCP - General (Family Medicine) [place], Peter M, MD as PCP - Cardiology (Cardiology) Gaspar Cola, [facility] (Inactive) as Pharmacist (Pharmacist) Pollyann Savoy, MD as Consulting Physician (Rheumatology)  Indicate any recent Medical Services you may have received from other than [facility] providers in the past year (date may be approximate).     Assessment:   This is a routine wellness examination for [place].  Hearing/Vision screen Hearing Screening - Comments:: Has hearing aids that are maintained Vision Screening - Comments::  Regular eye exams, [facility]  Dietary issues and exercise activities discussed:     Goals Addressed             This Visit's Progress    Patient Stated       12/19/2022, wants to eat healthier       Depression Screen    12/19/2022    1:23 PM 11/21/2022   11:09 AM 02/07/2022    1:30 PM 11/07/2021    2:08 PM 11/07/2021    2:06 PM 07/26/2021   10:08 AM 04/26/2021   11:51 AM  PHQ 2/9 Scores  PHQ - 2 Score 0 0 0 0 0 0 0  PHQ- 9 Score 0          Fall Risk    12/19/2022    1:18 PM 11/21/2022   11:08 AM 02/07/2022    1:30 PM 11/07/2021    2:09 PM 07/26/2021   10:09 AM  Fall Risk   Falls in the past year? 1 0 0 0 0  Comment slipped      Number falls in past yr: 1 0 0 0 0  Injury with Fall? 1 0  0 0  Comment dislocated thumb      Risk for fall due to : Medication side effect;Impaired balance/gait;History of fall(s) No Fall Risks     Follow up Falls prevention discussed;Falls evaluation completed Falls evaluation completed  Falls evaluation completed     MEDICARE RISK AT HOME:  Medicare Risk at Home - 12/19/22 1322     Any stairs in or around the home? Yes    If so, are there any without handrails? No    Home free of loose throw rugs in walkways, pet beds, electrical cords, etc? Yes    Adequate lighting in your home to reduce risk of falls? Yes    Life alert? No    Use of a cane, walker or w/c? No    Grab bars in the bathroom? Yes    Shower chair or bench in shower? Yes    Elevated toilet seat or a handicapped toilet? Yes             TIMED UP AND GO:  Was the test performed?  Yes  Length of time to ambulate 10 feet: 5 sec Gait steady and fast without use of assistive device    Cognitive Function:  6 CIT not administered. Patient has normal cognition per direct observation        Immunizations Immunization History  Administered Date(s) Administered   Influenza Whole 02/25/2021   Influenza, High Dose Seasonal PF 02/07/2022   Influenza-Unspecified  02/24/2018, 02/04/2019, 02/16/2020   PFIZER(Purple Top)SARS-COV-2 Vaccination 06/17/2019, 07/08/2019, 02/16/2020   Pneumococcal Conjugate-13 10/21/2013   Pneumococcal Polysaccharide-23 08/02/2008, 03/14/2016   Tdap 07/18/2019   Zoster Recombinant(Shingrix) 11/12/2017   Zoster, Live 08/28/2006    TDAP status: Up to date  Flu Vaccine status: Due, Education has been provided regarding the importance of this vaccine. Advised may receive this vaccine at local pharmacy or Health Dept. Aware to provide a copy of the vaccination record if obtained from local pharmacy or Health Dept. Verbalized acceptance and understanding.  Pneumococcal vaccine status: Up to date  Covid-19 vaccine status: Completed vaccines  Qualifies for Shingles Vaccine? Yes   Zostavax completed Yes   Shingrix Completed?: Yes  Screening Tests Health Maintenance  Topic Date Due   INFLUENZA VACCINE  12/18/2022   Zoster Vaccines- Shingrix (2 of 2) 01/29/2023 (Originally 01/07/2018)   Medicare Annual  Wellness (AWV)  12/19/2023   DTaP/Tdap/Td (2 - Td or Tdap) 07/17/2029   Pneumonia Vaccine 58+ Years old  Completed   Hepatitis C Screening  Completed   HPV VACCINES  Aged Out   Colonoscopy  Discontinued   COVID-19 Vaccine  Discontinued    Health Maintenance  Health Maintenance Due  Topic Date Due   INFLUENZA VACCINE  12/18/2022    Colorectal cancer screening: No longer required.   Lung Cancer Screening: (Low Dose CT Chest recommended if Age 4-80 years, 20 pack-year currently smoking OR have quit w/in 15years.) does not qualify.   Lung Cancer Screening Referral: no  Additional Screening:  Hepatitis C Screening: does qualify; Completed 10/03/2016  Vision Screening: Recommended annual ophthalmology exams for early detection of glaucoma and other disorders of the eye. Is the patient up to date with their annual eye exam?  Yes  Who is the provider or what is the name of the office in which the patient attends annual  eye exams? [facility] If pt is not established with a provider, would they like to be referred to a provider to establish care? No .   Dental Screening: Recommended annual dental exams for proper oral hygiene  Diabetic Foot Exam: n/a  Community Resource Referral / Chronic Care Management: CRR required this visit?  No   CCM required this visit?  No     Plan:     I have personally reviewed and noted the following in the patient's chart:   Medical and social history Use of alcohol, tobacco or illicit drugs  Current medications and supplements including opioid prescriptions. Patient is not currently taking opioid prescriptions. Functional ability and status Nutritional status Physical activity Advanced directives List of other physicians Hospitalizations, surgeries, and ER visits in previous 12 months Vitals Screenings to include cognitive, depression, and falls Referrals and appointments  In addition, I have reviewed and discussed with patient certain preventive protocols, quality metrics, and best practice recommendations. A written personalized care plan for preventive services as well as general preventive health recommendations were provided to patient.     Barb Merino, LPN   01/22/453   After Visit Summary: in person  Nurse Notes: none

## 2022-12-19 NOTE — Patient Instructions (Signed)
Dennis Moon , Thank you for taking time to come for your Medicare Wellness Visit. I appreciate your ongoing commitment to your health goals. Please review the following plan we discussed and let me know if I can assist you in the future.   Referrals/Orders/Follow-Ups/Clinician Recommendations: none  This is a list of the screening recommended for you and due dates:  Health Maintenance  Topic Date Due   Flu Shot  12/18/2022   Zoster (Shingles) Vaccine (2 of 2) 01/29/2023*   Medicare Annual Wellness Visit  12/19/2023   DTaP/Tdap/Td vaccine (2 - Td or Tdap) 07/17/2029   Pneumonia Vaccine  Completed   Hepatitis C Screening  Completed   HPV Vaccine  Aged Out   Colon Cancer Screening  Discontinued   COVID-19 Vaccine  Discontinued  *Topic was postponed. The date shown is not the original due date.    Advanced directives: (Copy Requested) Please bring a copy of your health care power of attorney and living will to the office to be added to your chart at your convenience.  Next Medicare Annual Wellness Visit scheduled for next year: Yes  Preventive Care 48 Years and Older, Male  Preventive care refers to lifestyle choices and visits with your health care provider that can promote health and wellness. What does preventive care include? A yearly physical exam. This is also called an annual well check. Dental exams once or twice a year. Routine eye exams. Ask your health care provider how often you should have your eyes checked. Personal lifestyle choices, including: Daily care of your teeth and gums. Regular physical activity. Eating a healthy diet. Avoiding tobacco and drug use. Limiting alcohol use. Practicing safe sex. Taking low doses of aspirin every day. Taking vitamin and mineral supplements as recommended by your health care provider. What happens during an annual well check? The services and screenings done by your health care provider during your annual well check will depend  on your age, overall health, lifestyle risk factors, and family history of disease. Counseling  Your health care provider may ask you questions about your: Alcohol use. Tobacco use. Drug use. Emotional well-being. Home and relationship well-being. Sexual activity. Eating habits. History of falls. Memory and ability to understand (cognition). Work and work Astronomer. Screening  You may have the following tests or measurements: Height, weight, and BMI. Blood pressure. Lipid and cholesterol levels. These may be checked every 5 years, or more frequently if you are over 64 years old. Skin check. Lung cancer screening. You may have this screening every year starting at age 12 if you have a 30-pack-year history of smoking and currently smoke or have quit within the past 15 years. Fecal occult blood test (FOBT) of the stool. You may have this test every year starting at age 7. Flexible sigmoidoscopy or colonoscopy. You may have a sigmoidoscopy every 5 years or a colonoscopy every 10 years starting at age 105. Prostate cancer screening. Recommendations will vary depending on your family history and other risks. Hepatitis C blood test. Hepatitis B blood test. Sexually transmitted disease (STD) testing. Diabetes screening. This is done by checking your blood sugar (glucose) after you have not eaten for a while (fasting). You may have this done every 1-3 years. Abdominal aortic aneurysm (AAA) screening. You may need this if you are a current or former smoker. Osteoporosis. You may be screened starting at age 67 if you are at high risk. Talk with your health care provider about your test results, treatment options, and  if necessary, the need for more tests. Vaccines  Your health care provider may recommend certain vaccines, such as: Influenza vaccine. This is recommended every year. Tetanus, diphtheria, and acellular pertussis (Tdap, Td) vaccine. You may need a Td booster every 10  years. Zoster vaccine. You may need this after age 78. Pneumococcal 13-valent conjugate (PCV13) vaccine. One dose is recommended after age 40. Pneumococcal polysaccharide (PPSV23) vaccine. One dose is recommended after age 60. Talk to your health care provider about which screenings and vaccines you need and how often you need them. This information is not intended to replace advice given to you by your health care provider. Make sure you discuss any questions you have with your health care provider. Document Released: 06/01/2015 Document Revised: 01/23/2016 Document Reviewed: 03/06/2015 Elsevier Interactive Patient Education  2017 [employer].  Fall Prevention in the Home Falls can cause injuries. They can happen to people of all ages. There are many things you can do to make your home safe and to help prevent falls. What can I do on the outside of my home? Regularly fix the edges of walkways and driveways and fix any cracks. Remove anything that might make you trip as you walk through a door, such as a raised step or threshold. Trim any bushes or trees on the path to your home. Use bright outdoor lighting. Clear any walking paths of anything that might make someone trip, such as rocks or tools. Regularly check to see if handrails are loose or broken. Make sure that both sides of any steps have handrails. Any raised decks and porches should have guardrails on the edges. Have any leaves, snow, or ice cleared regularly. Use sand or salt on walking paths during winter. Clean up any spills in your garage right away. This includes oil or grease spills. What can I do in the bathroom? Use night lights. Install grab bars by the toilet and in the tub and shower. Do not use towel bars as grab bars. Use non-skid mats or decals in the tub or shower. If you need to sit down in the shower, use a plastic, non-slip stool. Keep the floor dry. Clean up any water that spills on the floor as soon as it  happens. Remove soap buildup in the tub or shower regularly. Attach bath mats securely with double-sided non-slip rug tape. Do not have throw rugs and other things on the floor that can make you trip. What can I do in the bedroom? Use night lights. Make sure that you have a light by your bed that is easy to reach. Do not use any sheets or blankets that are too big for your bed. They should not hang down onto the floor. Have a firm chair that has side arms. You can use this for support while you get dressed. Do not have throw rugs and other things on the floor that can make you trip. What can I do in the kitchen? Clean up any spills right away. Avoid walking on wet floors. Keep items that you use a lot in easy-to-reach places. If you need to reach something above you, use a strong step stool that has a grab bar. Keep electrical cords out of the way. Do not use floor polish or wax that makes floors slippery. If you must use wax, use non-skid floor wax. Do not have throw rugs and other things on the floor that can make you trip. What can I do with my stairs? Do not leave any  items on the stairs. Make sure that there are handrails on both sides of the stairs and use them. Fix handrails that are broken or loose. Make sure that handrails are as long as the stairways. Check any carpeting to make sure that it is firmly attached to the stairs. Fix any carpet that is loose or worn. Avoid having throw rugs at the top or bottom of the stairs. If you do have throw rugs, attach them to the floor with carpet tape. Make sure that you have a light switch at the top of the stairs and the bottom of the stairs. If you do not have them, ask someone to add them for you. What else can I do to help prevent falls? Wear shoes that: Do not have high heels. Have rubber bottoms. Are comfortable and fit you well. Are closed at the toe. Do not wear sandals. If you use a stepladder: Make sure that it is fully opened.  Do not climb a closed stepladder. Make sure that both sides of the stepladder are locked into place. Ask someone to hold it for you, if possible. Clearly mark and make sure that you can see: Any grab bars or handrails. First and last steps. Where the edge of each step is. Use tools that help you move around (mobility aids) if they are needed. These include: Canes. Walkers. Scooters. Crutches. Turn on the lights when you go into a dark area. Replace any light bulbs as soon as they burn out. Set up your furniture so you have a clear path. Avoid moving your furniture around. If any of your floors are uneven, fix them. If there are any pets around you, be aware of where they are. Review your medicines with your doctor. Some medicines can make you feel dizzy. This can increase your chance of falling. Ask your doctor what other things that you can do to help prevent falls. This information is not intended to replace advice given to you by your health care provider. Make sure you discuss any questions you have with your health care provider. Document Released: 03/01/2009 Document Revised: 10/11/2015 Document Reviewed: 06/09/2014 Elsevier Interactive Patient Education  2017 [employer].

## 2022-12-28 NOTE — Progress Notes (Deleted)
Office Visit Note  Patient: Dennis Moon             Date of Birth: 1945-10-15           MRN: [id number]             PCP: Mliss Sax, MD Referring: Mliss Sax,* Visit Date: 01/09/2023 Occupation: @GUAROCC @  Subjective:  No chief complaint on file.   History of Present Illness: Dennis Moon is a 77 y.o. male ***     Activities of Daily Living:  Patient reports morning stiffness for *** {minute/hour:19697}.   Patient {ACTIONS;DENIES/REPORTS:21021675::"Denies"} nocturnal pain.  Difficulty dressing/grooming: {ACTIONS;DENIES/REPORTS:21021675::"Denies"} Difficulty climbing stairs: {ACTIONS;DENIES/REPORTS:21021675::"Denies"} Difficulty getting out of chair: {ACTIONS;DENIES/REPORTS:21021675::"Denies"} Difficulty using hands for taps, buttons, cutlery, and/or writing: {ACTIONS;DENIES/REPORTS:21021675::"Denies"}  No Rheumatology ROS completed.   PMFS History:  Patient Active Problem List   Diagnosis Date Noted   Acute pain of left shoulder 07/08/2022   Cough 07/07/2022   Need for influenza vaccination 02/07/2022   Herpes labialis 02/07/2022   Mild reactive airways disease 07/26/2021   Hospital discharge follow-up 04/26/2021   Prediabetes 04/26/2021   Unstable angina (HCC) 03/29/2021   Chest pain 03/29/2021   Gastroesophageal reflux disease 09/07/2020   Allergy to honey bee venom 09/07/2020   History of gout 12/29/2019   Elevated cholesterol 12/29/2019   Healthcare maintenance 12/29/2019   Nonintractable headache 06/24/2019   Androgen deficiency 11/17/2017   Hypothyroidism 11/17/2017   Cyst of tonsil 06/11/2017   Obesity (BMI 30.0-34.9) 03/12/2017   CAD S/P percutaneous coronary angioplasty 12/13/2016   NSTEMI (non-ST elevated myocardial infarction) (HCC) 12/11/2016   Gout of multiple sites 08/26/2016   Chronic renal impairment 08/26/2016   Chronic kidney disease 08/26/2016   Sjogren's syndrome (HCC) 04/23/2016   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 11/2016: NSTEMI with DES to LAD and DES to LCx; b. MV low risk 2021; c. NSTEMI 11/22, LHC patent stents to LAD/LCx, 40% mLAD, 20% mRCA, stable, med mgmt. Echo 11/22 EF 60-65%, GIDD, mild LAE.   CKD (chronic kidney disease), stage III (HCC)    GERD (gastroesophageal reflux disease)    Gout    Hyperlipidemia LDL goal <70    Hypertension    Hypothyroidism    Migraine    Osteoarthritis    Sjogren's disease (HCC)    Systemic lupus erythematosus (HCC)     Family History  Problem Relation Age of Onset   Diabetes Mother    Hypertension Mother    Hypertension Father    Healthy Son    Past Surgical History:  Procedure Laterality Date   CATARACT EXTRACTION, BILATERAL Bilateral 2017   COLONOSCOPY  2021   CORONARY STENT INTERVENTION N/A 12/12/2016   Procedure: Coronary Stent Intervention;  Surgeon: Swaziland, Peter M, MD;  Location: [facility] INVASIVE CV LAB;  Service: Cardiovascular;  Laterality: N/A;   HERNIA REPAIR     LEFT HEART CATH AND CORONARY ANGIOGRAPHY N/A 12/12/2016   Procedure: Left Heart Cath and Coronary Angiography;  Surgeon: Swaziland, Peter M, MD;  Location[facility] INVASIVE CV LAB;  Service: Cardiovascular;  Laterality: N/A;   LEFT HEART CATH AND CORONARY ANGIOGRAPHY N/A 03/29/2021   Procedure: LEFT  HEART CATH AND CORONARY ANGIOGRAPHY;  Surgeon: Kathleene Hazel, MD;  Location: [facility] INVASIVE CV LAB;  Service: Cardiovascular;  Laterality: N/A;   THYROID SURGERY  1997   Social History   Social History Narrative   Not on file   Immunization History  Administered Date(s) Administered   Influenza Whole 02/25/2021   Influenza,  High Dose Seasonal PF 02/07/2022   Influenza-Unspecified 02/24/2018, 02/04/2019, 02/16/2020   PFIZER(Purple Top)SARS-COV-2 Vaccination 06/17/2019, 07/08/2019, 02/16/2020   Pneumococcal Conjugate-13 10/21/2013   Pneumococcal Polysaccharide-23 08/02/2008, 03/14/2016   Tdap 07/18/2019   Zoster Recombinant(Shingrix) 11/12/2017   Zoster, Live 08/28/2006     Objective: Vital Signs: There were no vitals taken for this visit.   Physical Exam   Musculoskeletal Exam: ***  CDAI Exam: CDAI Score: -- Patient Global: --; Provider Global: -- Swollen: --; Tender: -- Joint Exam 01/09/2023   No joint exam has been documented for this visit   There is currently no information documented on the homunculus. Go to the Rheumatology activity and complete the homunculus joint exam.  Investigation: No additional findings.  Imaging: No results found.  Recent Labs: Lab Results  Component Value Date   WBC 9.6 11/21/2022   HGB 15.4 11/21/2022   PLT 279.0 11/21/2022   NA 140 11/21/2022   K 4.4 11/21/2022   CL 101 11/21/2022   CO2 32 11/21/2022   GLUCOSE 98 11/21/2022   BUN 16 11/21/2022   CREATININE 1.31 11/21/2022   BILITOT 0.5 11/21/2022   ALKPHOS 82 11/21/2022   AST 19 11/21/2022   ALT 17 11/21/2022   PROT 7.1 11/21/2022   ALBUMIN 3.6 11/21/2022   CALCIUM 9.0 11/21/2022   GFRAA 59 (L) 07/01/2019    Speciality Comments: PLQ eye exam normal on 05/07/18  @ [facility].  Procedures:  No procedures performed Allergies: Bee venom   Assessment / Plan:     Visit Diagnoses: No diagnosis found.  Orders: No orders of the defined types were placed in this encounter.  No orders of the defined types were placed in this encounter.   Face-to-face time spent with patient was *** minutes. Greater than 50% of time was spent in counseling and coordination of care.  Follow-Up Instructions: No follow-ups on file.   Pollyann Savoy, MD  Note - This record has been created using  Animal nutritionist.  Chart creation errors have been sought, but may not always  have been located. Such creation errors do not reflect on  the standard of medical care.

## 2023-01-02 NOTE — Progress Notes (Signed)
Office Visit Note  Patient: Dennis Moon             Date of Birth: 1946-03-25           MRN: [id number]             PCP: Mliss Sax, MD Referring: Mliss Sax,* Visit Date: 01/16/2023 Occupation: @GUAROCC @  Subjective:  Sicca symptoms.   History of Present Illness: Dennis Moon is a 77 y.o. male with history of sjogren's syndrome.  Patient is no longer taking Plaquenil or any other immunosuppressive agents.  He discontinued Plaquenil in 2020 due to changes in vision.  Patient continues to have chronic sicca symptoms.  He has been managing his symptoms with over-the-counter products.  He continues to have dental exams on a regular basis as well as ophthalmology visits every 6 months.  He denies any swollen lymph nodes, night sweats, or increased fatigue. He denies any cutaneous lupus flares.  He continues to see dermatology on a yearly basis.  He denies any signs or symptoms of a gout flare.  Patient continues to take allopurinol 300 mg 1 tablet daily. Patient had a fall while traveling at the end of May and sustained a left thumb open fracture and dislocation.  Evaluated in the emergency department on 10/14/2022.  He was treated with a course of antibiotics which led to diarrhea.  He eventually was diagnosed with C. difficile by his PCP and was treated with vancomycin.  His symptoms have completely resolved.    Patient continues to have chronic stiffness in the left shoulder which he attributes to a rotator cuff tear.  He has had some intermittent discomfort in the left knee which he attributes to the recent fall.  He denies any other joint pain or joint swelling at this time. He has not had a baseline DEXA.  He is taking a calcium and vitamin D supplement daily.   Activities of Daily Living:  Patient reports morning stiffness for 1 hour.   Patient Denies nocturnal pain.  Difficulty dressing/grooming: Denies Difficulty climbing stairs: Denies Difficulty  getting out of chair: Denies Difficulty using hands for taps, buttons, cutlery, and/or writing: Denies  Review of Systems  Constitutional:  Positive for fatigue.  HENT:  Positive for mouth dryness. Negative for mouth sores.   Eyes:  Positive for dryness.  Respiratory:  Negative for shortness of breath.   Cardiovascular:  Negative for chest pain and palpitations.  Gastrointestinal:  Negative for blood in stool, constipation and diarrhea.  Endocrine: Negative for increased urination.  Genitourinary:  Negative for involuntary urination.  Musculoskeletal:  Positive for joint pain, gait problem, joint pain, myalgias, muscle weakness, morning stiffness, muscle tenderness and myalgias. Negative for joint swelling.  Skin:  Positive for color change. Negative for rash and sensitivity to sunlight.  Allergic/Immunologic: Negative for susceptible to infections.  Neurological:  Negative for dizziness and headaches.  Hematological:  Negative for swollen glands.  Psychiatric/Behavioral:  Negative for depressed mood and sleep disturbance. The patient is not nervous/anxious.     PMFS History:  Patient Active Problem List   Diagnosis Date Noted   Acute pain of left shoulder 07/08/2022   Cough 07/07/2022   Need for influenza vaccination 02/07/2022   Herpes labialis 02/07/2022   Mild reactive airways disease 07/26/2021   Hospital discharge follow-up 04/26/2021   Prediabetes 04/26/2021   Unstable angina (HCC) 03/29/2021   Chest pain 03/29/2021   Gastroesophageal reflux disease 09/07/2020   Allergy to honey bee  venom 09/07/2020   History of gout 12/29/2019   Elevated cholesterol 12/29/2019   Healthcare maintenance 12/29/2019   Nonintractable headache 06/24/2019   Androgen deficiency 11/17/2017   Hypothyroidism 11/17/2017   Cyst of tonsil 06/11/2017   Obesity (BMI 30.0-34.9) 03/12/2017   CAD S/P percutaneous coronary angioplasty 12/13/2016   NSTEMI (non-ST elevated myocardial infarction) (HCC)  12/11/2016   Gout of multiple sites 08/26/2016   Chronic renal impairment 08/26/2016   Chronic kidney disease 08/26/2016   Sjogren's syndrome (HCC) 04/23/2016   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   Idiopathic chronic gout, unspecified site, without tophus (tophi) 04/23/2016   Primary osteoarthritis of both hands 04/23/2016   DDD (degenerative disc disease), lumbar 04/23/2016   Kidney disease 04/23/2016    Past Medical History:  Diagnosis Date   CAD S/P percutaneous coronary angioplasty 12/12/2016   a. 11/2016: NSTEMI with DES to LAD and DES to LCx; b. MV low risk 2021; c. NSTEMI 11/22, LHC patent stents to LAD/LCx, 40% mLAD, 20% mRCA, stable, med mgmt. Echo 11/22 EF 60-65%, GIDD, mild LAE.   CKD (chronic kidney disease), stage III (HCC)    GERD (gastroesophageal reflux disease)    Gout    History of Clostridioides difficile infection 2024   Hyperlipidemia LDL goal <70    Hypertension    Hypothyroidism    Migraine    Open fracture dislocation of interphalangeal joint of left thumb 10/14/2022   Osteoarthritis    Rotator cuff tear    left   Sjogren's disease (HCC)    Systemic lupus erythematosus (HCC)     Family History  Problem Relation Age of Onset   Diabetes Mother    Hypertension Mother    Hypertension Father    Healthy Son    Past Surgical History:  Procedure Laterality Date   CATARACT EXTRACTION, BILATERAL Bilateral 2017   COLONOSCOPY  2021   CORONARY STENT INTERVENTION N/A 12/12/2016   Procedure: Coronary Stent Intervention;  Surgeon: Swaziland, Peter M, MD;  Location: [facility] INVASIVE CV LAB;  Service: Cardiovascular;  Laterality: N/A;   HERNIA REPAIR     LEFT HEART CATH AND CORONARY ANGIOGRAPHY N/A 12/12/2016   Procedure: Left Heart Cath and Coronary Angiography;  Surgeon: Swaziland, Peter M, MD;  Location: [facility] INVASIVE CV LAB;  Service: Cardiovascular;  Laterality: N/A;   LEFT HEART CATH AND CORONARY ANGIOGRAPHY N/A 03/29/2021   Procedure: LEFT HEART CATH AND CORONARY ANGIOGRAPHY;   Surgeon: Kathleene Hazel, MD;  Location: [facility] INVASIVE CV LAB;  Service: Cardiovascular;  Laterality: N/A;   THYROID SURGERY  1997   Social History   Social History Narrative   Not on file   Immunization History  Administered Date(s) Administered   Influenza Whole 02/25/2021   Influenza, High Dose Seasonal PF 02/07/2022   Influenza-Unspecified 02/24/2018, 02/04/2019, 02/16/2020   PFIZER(Purple Top)SARS-COV-2 Vaccination 06/17/2019, 07/08/2019, 02/16/2020   Pneumococcal Conjugate-13 10/21/2013   Pneumococcal Polysaccharide-23 08/02/2008, 03/14/2016   Tdap 07/18/2019   Zoster Recombinant(Shingrix) 11/12/2017   Zoster, Live 08/28/2006     Objective: Vital Signs: BP 119/75 (BP Location: Left Arm, Patient Position: Sitting, Cuff Size: Normal)   Pulse 76   Resp 15   Ht 6' (1.829 m)   Wt 228 lb 6.4 oz (103.6 kg)   BMI 30.98 kg/m    Physical Exam Vitals and nursing note reviewed.  Constitutional:      Appearance: He is well-developed.  HENT:     Head: Normocephalic and atraumatic.  Eyes:     Conjunctiva/sclera: Conjunctivae normal.  Pupils: Pupils are equal, round, and reactive to light.  Cardiovascular:     Rate and Rhythm: Normal rate and regular rhythm.     Heart sounds: Normal heart sounds.  Pulmonary:     Effort: Pulmonary effort is normal.     Breath sounds: Normal breath sounds.  Abdominal:     General: Bowel sounds are normal.     Palpations: Abdomen is soft.  Musculoskeletal:     Cervical back: Normal range of motion and neck supple.  Skin:    General: Skin is warm and dry.     Capillary Refill: Capillary refill takes less than 2 seconds.  Neurological:     Mental Status: He is alert and oriented to person, place, and time.  Psychiatric:        Behavior: Behavior normal.      Musculoskeletal Exam: C-spine has limited range of motion with lateral rotation especially to the left.  Right shoulder is full range of motion.  Left shoulder with limited  abduction and internal rotation.  Elbow joints have good range of motion.  Wrist joints have good range of motion with no tenderness or synovitis.  Limited range of motion and thickening of the left first PIP joint.  PIP and DIP thickening consistent with osteoarthritis of both hands.  No tenderness or synovitis over MCP joints.  Hip joints have good range of motion with no groin pain.  Knee joints have good range of motion with no warmth or effusion.  Ankle joints have good range of motion with no tenderness or joint swelling.  CDAI Exam: CDAI Score: -- Patient Global: --; Provider Global: -- Swollen: --; Tender: -- Joint Exam 01/16/2023   No joint exam has been documented for this visit   There is currently no information documented on the homunculus. Go to the Rheumatology activity and complete the homunculus joint exam.  Investigation: No additional findings.  Imaging: No results found.  Recent Labs: Lab Results  Component Value Date   WBC 9.6 11/21/2022   HGB 15.4 11/21/2022   PLT 279.0 11/21/2022   NA 140 11/21/2022   K 4.4 11/21/2022   CL 101 11/21/2022   CO2 32 11/21/2022   GLUCOSE 98 11/21/2022   BUN 16 11/21/2022   CREATININE 1.31 11/21/2022   BILITOT 0.5 11/21/2022   ALKPHOS 82 11/21/2022   AST 19 11/21/2022   ALT 17 11/21/2022   PROT 7.1 11/21/2022   ALBUMIN 3.6 11/21/2022   CALCIUM 9.0 11/21/2022   GFRAA 59 (L) 07/01/2019    Speciality Comments: PLQ eye exam normal on 05/07/18  @ [facility].  Procedures:  No procedures performed Allergies: Bee venom    Assessment / Plan:     Visit Diagnoses: Sjogren's syndrome with other organ involvement (HCC) - Positive ANA, positive Ro, positive La: He continues to have chronic sicca symptoms which have been manageable with the use of over-the-counter products.  He continues to have regular dental cleanings as well as sees his ophthalmologist every 6 months.  He has been using Systane eyedrops for dry eyes  and basic bites for oral health and dryness. Discussed the increased risk for developing lymphoma in patients with Sjogren's syndrome.  Discussed signs and symptoms to monitor for including fatigue, swollen lymph nodes, night sweats, and unintentional weight loss.  I also discussed the increased risk of developing ILD in patients with Sjogren's syndrome.  He has not developed any new or worsening pulmonary symptoms.  His lungs were clear to auscultation  today. He has no synovitis on examination today.  He does not currently require immunosuppressive therapy. Lab work from 06/27/2022 was reviewed today in the office: ANA 1: 320 nuclear fine speckled, 180 cytoplasmic, Ro >8, SPEP did not reveal any abnormal protein bands, ESR within normal limits, C3 and C4 WNL.  Plan to obtain the following lab work today for further evaluation.  He was advised to notify us if he develops signs or symptoms of a flare.  He will follow-up in the office in 6 months or sooner if needed.  - Plan: Sedimentation rate, C3 and C4, Urinalysis, Routine w reflex microscopic, ANA, Sjogrens syndrome-A extractable nuclear antibody, Serum protein electrophoresis with reflex, CBC with Differential/Platelet, COMPLETE METABOLIC PANEL WITH GFR  Subacute cutaneous lupus erythematosus: He has had any signs or symptoms of a cutaneous lupus flare.  He continues to follow-up with dermatology on a yearly basis.  Not currently needing clobetasol cream.  Patient was advised to notify us if he develops signs or symptoms of a flare. No need for systemic treatment at this time.   High risk medication use - d/c PLQ due to abnormal eye exam 11/2018.  Not currently taking immunosuppressive agents.  CBC and CMP updated on 11/21/22.   - Plan: CBC with Differential/Platelet, COMPLETE METABOLIC PANEL WITH GFR  Idiopathic chronic gout of multiple sites without tophus -He has not had any signs or symptoms of a gout flare.  He continues to take allopurinol 300 mg 1  tablet by mouth daily. uric acid was within the desirable range- 4.1 on 06/27/2022. Plan to recheck uric acid level today.  He will remain on allopurinol as prescribed.  He was advised notify us if he develops signs or symptoms of a gout flare.- Plan: Uric acid  Primary osteoarthritis of both hands: PIP and DIP thickening consistent with osteoarthritis of both hands.  Thickening and limited flexion of the left first PIP joint-s/p fracture after a fall at the end of June 2024.   No synovitis noted on examination today. Patient continues to work as a Education officer, community.  Discussed the importance of joint protection and muscle strengthening.  DDD (degenerative disc disease), lumbar: No midline spinal tenderness.  History of Clostridioides difficile infection - Stool sample positive on 11/21/2022.  Treated with vancomycin. Resolved.  History of thumb fracture - Left-Fall end of May 2024. Offered to order DEXA but patient plans to further discuss with PCP.  Other medical conditions are listed as follows:   CAD S/P percutaneous coronary angioplasty  History of chronic kidney disease - He is followed by nephrologist.  History of non-ST elevation myocardial infarction (NSTEMI) - He underwent a left heart catheterization on 03/29/2021.  Orders: Orders Placed This Encounter  Procedures   Sedimentation rate   C3 and C4   Urinalysis, Routine w reflex microscopic   ANA   Sjogrens syndrome-A extractable nuclear antibody   Uric acid   Serum protein electrophoresis with reflex   CBC with Differential/Platelet   COMPLETE METABOLIC PANEL WITH GFR   No orders of the defined types were placed in this encounter.    Follow-Up Instructions: Return in about 6 months (around 07/17/2023) for Sjogren's syndrome, Gout.   Gearldine Bienenstock, PA-C  Note - This record has been created using Dragon software.  Chart creation errors have been sought, but may not always  have been located. Such creation errors do not reflect  on  the standard of medical care.

## 2023-01-09 ENCOUNTER — Ambulatory Visit: Payer: Medicare Other | Admitting: Rheumatology

## 2023-01-09 DIAGNOSIS — Z87448 Personal history of other diseases of urinary system: Secondary | ICD-10-CM

## 2023-01-09 DIAGNOSIS — Z79899 Other long term (current) drug therapy: Secondary | ICD-10-CM

## 2023-01-09 DIAGNOSIS — I2510 Atherosclerotic heart disease of native coronary artery without angina pectoris: Secondary | ICD-10-CM

## 2023-01-09 DIAGNOSIS — M3509 Sicca syndrome with other organ involvement: Secondary | ICD-10-CM

## 2023-01-09 DIAGNOSIS — I252 Old myocardial infarction: Secondary | ICD-10-CM

## 2023-01-09 DIAGNOSIS — L931 Subacute cutaneous lupus erythematosus: Secondary | ICD-10-CM

## 2023-01-09 DIAGNOSIS — M19042 Primary osteoarthritis, left hand: Secondary | ICD-10-CM

## 2023-01-09 DIAGNOSIS — M5136 Other intervertebral disc degeneration, lumbar region: Secondary | ICD-10-CM

## 2023-01-09 DIAGNOSIS — M1A09X0 Idiopathic chronic gout, multiple sites, without tophus (tophi): Secondary | ICD-10-CM

## 2023-01-16 ENCOUNTER — Ambulatory Visit: Payer: Medicare Other | Attending: Rheumatology | Admitting: Physician Assistant

## 2023-01-16 ENCOUNTER — Encounter: Payer: Self-pay | Admitting: Physician Assistant

## 2023-01-16 VITALS — BP 119/75 | HR 76 | Resp 15 | Ht 72.0 in | Wt 228.4 lb

## 2023-01-16 DIAGNOSIS — M19042 Primary osteoarthritis, left hand: Secondary | ICD-10-CM | POA: Diagnosis not present

## 2023-01-16 DIAGNOSIS — Z79899 Other long term (current) drug therapy: Secondary | ICD-10-CM | POA: Diagnosis not present

## 2023-01-16 DIAGNOSIS — M3509 Sicca syndrome with other organ involvement: Secondary | ICD-10-CM | POA: Diagnosis not present

## 2023-01-16 DIAGNOSIS — M5136 Other intervertebral disc degeneration, lumbar region: Secondary | ICD-10-CM

## 2023-01-16 DIAGNOSIS — M1A09X0 Idiopathic chronic gout, multiple sites, without tophus (tophi): Secondary | ICD-10-CM | POA: Diagnosis not present

## 2023-01-16 DIAGNOSIS — Z87448 Personal history of other diseases of urinary system: Secondary | ICD-10-CM

## 2023-01-16 DIAGNOSIS — I252 Old myocardial infarction: Secondary | ICD-10-CM | POA: Diagnosis not present

## 2023-01-16 DIAGNOSIS — L931 Subacute cutaneous lupus erythematosus: Secondary | ICD-10-CM | POA: Diagnosis not present

## 2023-01-16 DIAGNOSIS — Z9861 Coronary angioplasty status: Secondary | ICD-10-CM

## 2023-01-16 DIAGNOSIS — I2510 Atherosclerotic heart disease of native coronary artery without angina pectoris: Secondary | ICD-10-CM | POA: Diagnosis not present

## 2023-01-16 DIAGNOSIS — Z8619 Personal history of other infectious and parasitic diseases: Secondary | ICD-10-CM | POA: Diagnosis not present

## 2023-01-16 DIAGNOSIS — Z8781 Personal history of (healed) traumatic fracture: Secondary | ICD-10-CM | POA: Diagnosis not present

## 2023-01-16 DIAGNOSIS — M19041 Primary osteoarthritis, right hand: Secondary | ICD-10-CM | POA: Diagnosis not present

## 2023-01-17 ENCOUNTER — Other Ambulatory Visit: Payer: Self-pay | Admitting: Family Medicine

## 2023-01-17 DIAGNOSIS — J45909 Unspecified asthma, uncomplicated: Secondary | ICD-10-CM

## 2023-01-20 NOTE — Progress Notes (Signed)
ANA remains positive. Ro antibody remains positive   CBC WNL ESR and complements WNL Uric acid WNL CMP WNL Trace protein in urine. Rest of UA normal.

## 2023-01-22 ENCOUNTER — Other Ambulatory Visit: Payer: Self-pay | Admitting: Physician Assistant

## 2023-01-22 ENCOUNTER — Telehealth: Payer: Self-pay | Admitting: Family Medicine

## 2023-01-22 ENCOUNTER — Other Ambulatory Visit: Payer: Self-pay | Admitting: Cardiology

## 2023-01-22 NOTE — Telephone Encounter (Signed)
He needs to add  metFORMIN (GLUCOPHAGE-XR) 500 MG 24 hr tablet [[id number]]  propranolol (INDERAL) 60 MG tablet [[id number]]

## 2023-01-22 NOTE — Telephone Encounter (Signed)
Last Fill: 11/12/2022  Labs: 01/16/2023 CBC WNL Uric acid WNL CMP WNL  Next Visit: 07/24/2023  Last Visit: 01/16/2023  DX:  Sjogren's syndrome with other organ involvement   Current Dose per office note 01/16/2023:  allopurinol 300 mg 1 tablet by mouth daily.    Okay to refill Allopurinol?

## 2023-01-22 NOTE — Telephone Encounter (Signed)
amLODipine (NORVASC) 5 MG tablet [[id number]]  atorvastatin (LIPITOR) 80 MG tablet [[id number]]  DULoxetine (CYMBALTA) 60 MG capsule [[id number]  esomeprazole (NEXIUM) 20 MG capsule [[id number]]  levothyroxine (SYNTHROID) 112 MCG tablet [[id number]]    The pt has said that [facility] let him know today they will not refill his meds. He only has a few of each of these and would like to know if you will call them into [facility] on [address] in [place].  He will be going out of town tomorrow and sorry it is short notice and thanks you for your help.

## 2023-01-23 ENCOUNTER — Other Ambulatory Visit: Payer: Self-pay | Admitting: Cardiology

## 2023-01-23 ENCOUNTER — Telehealth: Payer: Self-pay | Admitting: Cardiology

## 2023-01-23 ENCOUNTER — Other Ambulatory Visit: Payer: Self-pay

## 2023-01-23 ENCOUNTER — Other Ambulatory Visit: Payer: Self-pay | Admitting: Physician Assistant

## 2023-01-23 DIAGNOSIS — R7303 Prediabetes: Secondary | ICD-10-CM

## 2023-01-23 DIAGNOSIS — E039 Hypothyroidism, unspecified: Secondary | ICD-10-CM

## 2023-01-23 DIAGNOSIS — R519 Headache, unspecified: Secondary | ICD-10-CM

## 2023-01-23 DIAGNOSIS — J45909 Unspecified asthma, uncomplicated: Secondary | ICD-10-CM

## 2023-01-23 DIAGNOSIS — K219 Gastro-esophageal reflux disease without esophagitis: Secondary | ICD-10-CM

## 2023-01-23 MED ORDER — PROPRANOLOL HCL 60 MG PO TABS
ORAL_TABLET | ORAL | 4 refills | Status: DC
Start: 2023-01-23 — End: 2023-12-31

## 2023-01-23 MED ORDER — ESOMEPRAZOLE MAGNESIUM 20 MG PO CPDR: 20.0000 mg | DELAYED_RELEASE_CAPSULE | Freq: Two times a day (BID) | ORAL | 3 refills | Status: DC

## 2023-01-23 MED ORDER — AMLODIPINE BESYLATE 5 MG PO TABS: 5.0000 mg | ORAL_TABLET | Freq: Every day | ORAL | 3 refills | Status: DC

## 2023-01-23 MED ORDER — METFORMIN HCL ER 500 MG PO TB24: 500.0000 mg | ORAL_TABLET | Freq: Every evening | ORAL | 3 refills | Status: DC

## 2023-01-23 MED ORDER — ATORVASTATIN CALCIUM 80 MG PO TABS: 80.0000 mg | ORAL_TABLET | Freq: Every evening | ORAL | 0 refills | Status: DC

## 2023-01-23 MED ORDER — LEVOTHYROXINE SODIUM 112 MCG PO TABS: 112.0000 ug | ORAL_TABLET | Freq: Every evening | ORAL | 2 refills | Status: DC

## 2023-01-23 MED ORDER — LEVOTHYROXINE SODIUM 112 MCG PO TABS
112.0000 ug | ORAL_TABLET | Freq: Every evening | ORAL | 2 refills | Status: DC
Start: 2023-01-23 — End: 2023-02-27

## 2023-01-23 MED ORDER — DULOXETINE HCL 60 MG PO CPEP: 60.0000 mg | ORAL_CAPSULE | Freq: Every evening | ORAL | 2 refills | Status: DC

## 2023-01-23 MED ORDER — ALBUTEROL SULFATE HFA 108 (90 BASE) MCG/ACT IN AERS
INHALATION_SPRAY | RESPIRATORY_TRACT | 10 refills | Status: AC
Start: 2023-01-23 — End: ?

## 2023-01-23 NOTE — Telephone Encounter (Signed)
[facility] is closing so he needs them to go to [facility] on [address] in [place]

## 2023-01-23 NOTE — Telephone Encounter (Signed)
Pt's medication was sent to pt's preferred pharmacy for a 15 day supply until mail order arrives. Confirmation received.

## 2023-01-23 NOTE — Telephone Encounter (Signed)
*  STAT* If patient is at the pharmacy, call can be transferred to refill team.   1. Which medications need to be refilled? (please list name of each medication and dose if known) atorvastatin (LIPITOR) 80 MG tablet    2. Would you like to learn more about the convenience, safety, & potential cost savings by using the [facility]?     3. Are you open to using the [facility] (Type [facility].  ).   4. Which pharmacy/location (including street and city if local pharmacy) is medication to be sent to? [id number] - [place], [place] - [address] AT [facility] [address]    5. Do they need a 30 day or 90 day supply? 7 day supply   just needs enough until mail order comes in.

## 2023-01-24 ENCOUNTER — Other Ambulatory Visit: Payer: Self-pay | Admitting: Cardiology

## 2023-01-24 LAB — PROTEIN ELECTROPHORESIS, SERUM, WITH REFLEX
Albumin ELP: 3.8 g/dL (ref 3.8–4.8)
Alpha 1: 0.3 g/dL (ref 0.2–0.3)
Alpha 2: 0.7 g/dL (ref 0.5–0.9)
Beta 2: 0.5 g/dL (ref 0.2–0.5)
Beta Globulin: 0.5 g/dL (ref 0.4–0.6)
Gamma Globulin: 1.6 g/dL (ref 0.8–1.7)
Total Protein: 7.4 g/dL (ref 6.1–8.1)

## 2023-01-24 LAB — SJOGRENS SYNDROME-A EXTRACTABLE NUCLEAR ANTIBODY: SSA (Ro) (ENA) Antibody, IgG: >8 AI — AB

## 2023-01-24 LAB — CBC WITH DIFFERENTIAL/PLATELET
Absolute Monocytes: 580 cells/uL (ref 200–950)
Basophils Absolute: 43 {cells}/uL (ref 0–200)
Basophils Relative: 0.7 %
Eosinophils Absolute: 153 {cells}/uL (ref 15–500)
Eosinophils Relative: 2.5 %
HCT: 52.1 % — ABNORMAL HIGH (ref 38.5–50.0)
Hemoglobin: 17 g/dL (ref 13.2–17.1)
Lymphs Abs: 1055 {cells}/uL (ref 850–3900)
MCH: 30.5 pg (ref 27.0–33.0)
MCHC: 32.6 g/dL (ref 32.0–36.0)
MCV: 93.5 fL (ref 80.0–100.0)
MPV: 11.5 fL (ref 7.5–12.5)
Monocytes Relative: 9.5 %
Neutro Abs: 4270 {cells}/uL (ref 1500–7800)
Neutrophils Relative %: 70 %
Platelets: 221 10*3/uL (ref 140–400)
RBC: 5.57 10*6/uL (ref 4.20–5.80)
RDW: 14.7 % (ref 11.0–15.0)
Total Lymphocyte: 17.3 %
WBC: 6.1 10*3/uL (ref 3.8–10.8)

## 2023-01-24 LAB — URINALYSIS, ROUTINE W REFLEX MICROSCOPIC
Bacteria, UA: NONE SEEN /HPF
Bilirubin Urine: NEGATIVE
Glucose, UA: NEGATIVE
Hgb urine dipstick: NEGATIVE
Hyaline Cast: NONE SEEN /LPF
Ketones, ur: NEGATIVE
Leukocytes,Ua: NEGATIVE
Nitrite: NEGATIVE
RBC / HPF: NONE SEEN /HPF (ref 0–2)
Specific Gravity, Urine: 1.023 (ref 1.001–1.035)
Squamous Epithelial / HPF: NONE SEEN /HPF (ref <=5)
WBC, UA: NONE SEEN /HPF (ref 0–5)
pH: 6 (ref 5.0–8.0)

## 2023-01-24 LAB — COMPLETE METABOLIC PANEL WITH GFR
AG Ratio: 1.3 (calc) (ref 1.0–2.5)
ALT: 24 U/L (ref 9–46)
AST: 27 U/L (ref 10–35)
Albumin: 4.2 g/dL (ref 3.6–5.1)
Alkaline phosphatase (APISO): 104 U/L (ref 35–144)
BUN: 22 mg/dL (ref 7–25)
CO2: 29 mmol/L (ref 20–32)
Calcium: 9 mg/dL (ref 8.6–10.3)
Chloride: 102 mmol/L (ref 98–110)
Creat: 1.22 mg/dL (ref 0.70–1.28)
Globulin: 3.3 g/dL (ref 1.9–3.7)
Glucose, Bld: 97 mg/dL (ref 65–99)
Potassium: 4.6 mmol/L (ref 3.5–5.3)
Sodium: 142 mmol/L (ref 135–146)
Total Bilirubin: 0.5 mg/dL (ref 0.2–1.2)
Total Protein: 7.5 g/dL (ref 6.1–8.1)
eGFR: 61 mL/min/{1.73_m2} (ref >=60)

## 2023-01-24 LAB — ANTI-NUCLEAR AB-TITER (ANA TITER)
ANA TITER: 1:80 {titer} — ABNORMAL HIGH
ANA Titer 1: 1:320 {titer} — ABNORMAL HIGH

## 2023-01-24 LAB — C3 AND C4
C3 Complement: 150 mg/dL (ref 82–185)
C4 Complement: 24 mg/dL (ref 15–53)

## 2023-01-24 LAB — URIC ACID: Uric Acid, Serum: 4.2 mg/dL (ref 4.0–8.0)

## 2023-01-24 LAB — SEDIMENTATION RATE: Sed Rate: 6 mm/h (ref 0–20)

## 2023-01-24 LAB — MICROSCOPIC MESSAGE

## 2023-01-24 LAB — ANA: Anti Nuclear Antibody (ANA): POSITIVE — AB

## 2023-01-26 NOTE — Progress Notes (Signed)
SPEP did not reveal any abnormal protein bands.

## 2023-02-11 ENCOUNTER — Other Ambulatory Visit: Payer: Self-pay | Admitting: Family Medicine

## 2023-02-11 DIAGNOSIS — E291 Testicular hypofunction: Secondary | ICD-10-CM

## 2023-02-11 DIAGNOSIS — Z91030 Bee allergy status: Secondary | ICD-10-CM

## 2023-02-11 NOTE — Telephone Encounter (Signed)
[facility] is calling to request testosterone (ANDROGEL) 50 MG/5GM (1%) GEL [[id number]] and EPINEPHRINE 0.3 mg/0.3 mL IJ SOAJ injection [[id number]] for pt.  They would like it sent to [facility]

## 2023-02-12 MED ORDER — EPINEPHRINE 0.3 MG/0.3ML IJ SOAJ: 0.3000 mg | INTRAMUSCULAR | 2 refills | Status: DC

## 2023-02-12 MED ORDER — TESTOSTERONE 50 MG/5GM (1%) TD GEL: TRANSDERMAL | 4 refills | Status: DC

## 2023-02-12 NOTE — Telephone Encounter (Signed)
Chart supports rx. Last OV: 12/19/2022 Next OV: 12/25/2023  Unable to rx testosterone (ANDROGEL) 50 MG/5GM (1%) GEL . Can you please order for [facility] to [facility].

## 2023-02-13 MED ORDER — TESTOSTERONE 50 MG/5GM (1%) TD GEL
TRANSDERMAL | 4 refills | Status: DC
Start: 2023-02-13 — End: 2023-03-09

## 2023-02-26 ENCOUNTER — Other Ambulatory Visit: Payer: Self-pay | Admitting: Family Medicine

## 2023-02-26 DIAGNOSIS — E039 Hypothyroidism, unspecified: Secondary | ICD-10-CM

## 2023-03-06 ENCOUNTER — Telehealth: Payer: Self-pay | Admitting: Family Medicine

## 2023-03-06 DIAGNOSIS — E291 Testicular hypofunction: Secondary | ICD-10-CM

## 2023-03-06 NOTE — Telephone Encounter (Signed)
Refill request  testosterone (ANDROGEL) 50 MG/5GM (1%) GEL [[id number]]     Pt is requesting a 90 day supply sent to this pharmacy and wants his pharmacy changed to this.   [facility] - [address] [place] - [address], [place] [address] Phone: [phone]  Fax: [phone]

## 2023-03-10 MED ORDER — TESTOSTERONE 50 MG/5GM (1%) TD GEL
TRANSDERMAL | 4 refills | Status: DC
Start: 2023-03-10 — End: 2023-06-15

## 2023-03-18 NOTE — Telephone Encounter (Signed)
Dennis Moon can you see if you are able to close this. TY

## 2023-03-22 ENCOUNTER — Telehealth: Payer: Self-pay | Admitting: Cardiology

## 2023-03-22 ENCOUNTER — Other Ambulatory Visit: Payer: Self-pay | Admitting: Family Medicine

## 2023-03-22 DIAGNOSIS — E291 Testicular hypofunction: Secondary | ICD-10-CM

## 2023-03-22 NOTE — Telephone Encounter (Signed)
Outpatient service line: Chief complaint: Feeling unwell  Received a page stating that patient reportedly had high blood pressure and stated that it is "irregular", unsure what this is in reference to.  Also stated that he had some chest pain and generally was not feeling well.  Attempted to call his phone number twice.  Left a voicemail to call back and/or page once again.  Also attempted to call his spouses phone number.  No answer.  Will route to primary MD to follow-up-perhaps be able to reach tomorrow.  Left a voicemail stating to seek emergency care if there were significant concerns.

## 2023-03-23 ENCOUNTER — Encounter: Payer: Self-pay | Admitting: Cardiology

## 2023-03-23 ENCOUNTER — Other Ambulatory Visit: Payer: Self-pay | Admitting: Cardiology

## 2023-03-23 ENCOUNTER — Ambulatory Visit: Payer: Medicare Other | Admitting: Cardiology

## 2023-03-23 ENCOUNTER — Ambulatory Visit: Payer: Medicare Other

## 2023-03-23 ENCOUNTER — Telehealth: Payer: Self-pay | Admitting: Cardiology

## 2023-03-23 ENCOUNTER — Ambulatory Visit: Payer: Medicare Other | Attending: Cardiology | Admitting: Cardiology

## 2023-03-23 VITALS — BP 122/82 | HR 113 | Ht 72.0 in | Wt 224.0 lb

## 2023-03-23 DIAGNOSIS — I10 Essential (primary) hypertension: Secondary | ICD-10-CM

## 2023-03-23 DIAGNOSIS — N183 Chronic kidney disease, stage 3 unspecified: Secondary | ICD-10-CM | POA: Diagnosis not present

## 2023-03-23 DIAGNOSIS — I5189 Other ill-defined heart diseases: Secondary | ICD-10-CM

## 2023-03-23 DIAGNOSIS — R000 Tachycardia, unspecified: Secondary | ICD-10-CM

## 2023-03-23 DIAGNOSIS — I4719 Other supraventricular tachycardia: Secondary | ICD-10-CM

## 2023-03-23 DIAGNOSIS — E785 Hyperlipidemia, unspecified: Secondary | ICD-10-CM

## 2023-03-23 DIAGNOSIS — I2510 Atherosclerotic heart disease of native coronary artery without angina pectoris: Secondary | ICD-10-CM

## 2023-03-23 DIAGNOSIS — I4510 Unspecified right bundle-branch block: Secondary | ICD-10-CM

## 2023-03-23 DIAGNOSIS — I491 Atrial premature depolarization: Secondary | ICD-10-CM

## 2023-03-23 NOTE — Patient Instructions (Signed)
Medication Instructions:  Increase Inderal 60 mg 3 times daily  Continue all other medications  *If you need a refill on your cardiac medications before your next appointment, please call your pharmacy*   Lab Work: CBC,CMP,Lipid,TSH  If you have labs (blood work) drawn today and your tests are completely normal, you will receive your results only by: MyChart Message (if you have MyChart) OR A paper copy in the mail If you have any lab test that is abnormal or we need to change your treatment, we will call you to review the results.   Testing/Procedures: Christena Deem- Long Term Monitor Instructions  Your physician has requested you wear a ZIO patch monitor for 14 days.  This is a single patch monitor. [employer] supplies one patch monitor per enrollment. Additional stickers are not available. Please do not apply patch if you will be having a Nuclear Stress Test,  Echocardiogram, Cardiac CT, MRI, or Chest Xray during the period you would be wearing the  monitor. The patch cannot be worn during these tests. You cannot remove and re-apply the  ZIO XT patch monitor.  Your ZIO patch monitor will be mailed 3 day USPS to your address on file. It may take 3-5 days  to receive your monitor after you have been enrolled.  Once you have received your monitor, please review the enclosed instructions. Your monitor  has already been registered assigning a specific monitor serial # to you.  Billing and Patient Assistance Program Information  We have supplied [employer] with any of your insurance information on file for billing purposes. [employer] offers a sliding scale Patient Assistance Program for patients that do not have  insurance, or whose insurance does not completely cover the cost of the ZIO monitor.  You must apply for the Patient Assistance Program to qualify for this discounted rate.  To apply, please call [employer] at [phone], select option 4, select option 2, ask to apply for  Patient Assistance  Program. Meredeth Ide will ask your household income, and how many people  are in your household. They will quote your out-of-pocket cost based on that information.  [employer] will also be able to set up a 15-month, interest-free payment plan if needed.  Applying the monitor   Shave hair from upper left chest.  Hold abrader disc by orange tab. Rub abrader in 40 strokes over the upper left chest as  indicated in your monitor instructions.  Clean area with 4 enclosed alcohol pads. Let dry.  Apply patch as indicated in monitor instructions. Patch will be placed under collarbone on left  side of chest with arrow pointing upward.  Rub patch adhesive wings for 2 minutes. Remove white label marked "1". Remove the white  label marked "2". Rub patch adhesive wings for 2 additional minutes.  While looking in a mirror, press and release button in center of patch. A small green light will  flash 3-4 times. This will be your only indicator that the monitor has been turned on.  Do not shower for the first 24 hours. You may shower after the first 24 hours.  Press the button if you feel a symptom. You will hear a small click. Record Date, Time and  Symptom in the Patient Logbook.  When you are ready to remove the patch, follow instructions on the last 2 pages of Patient  Logbook. Stick patch monitor onto the last page of Patient Logbook.  Place Patient Logbook in the blue and white box. Use locking tab on box  and tape box closed  securely. The blue and white box has prepaid postage on it. Please place it in the mailbox as  soon as possible. Your physician should have your test results approximately 7 days after the  monitor has been mailed back to [facility].  Call [facility] Customer Care at [phone] if you have questions regarding  your ZIO XT patch monitor. Call them immediately if you see an orange light blinking on your  monitor.  If your monitor falls off in less than 4 days, contact our  Monitor department at [phone].  If your monitor becomes loose or falls off after 4 days call [employer] at [phone] for  suggestions on securing your monitor    Follow-Up: At [facility], you and your health needs are our priority.  As part of our continuing mission to provide you with exceptional heart care, we have created designated Provider Care Teams.  These Care Teams include your primary Cardiologist (physician) and Advanced Practice Providers (APPs -  Physician Assistants and Nurse Practitioners) who all work together to provide you with the care you need, when you need it.  We recommend signing up for the patient portal called "MyChart".  Sign up information is provided on this After Visit Summary.  MyChart is used to connect with patients for Virtual Visits (Telemedicine).  Patients are able to view lab/test results, encounter notes, upcoming appointments, etc.  Non-urgent messages can be sent to your provider as well.   To learn more about what you can do with MyChart, go to [email or web address].    Your next appointment:   Follow up will be determined after ZIO   Provider:   Peter Swaziland, MD

## 2023-03-23 NOTE — Progress Notes (Signed)
Cardiology Office Note    Date:  03/23/2023   ID:  Dennis Moon, DOB November 11, 1945, MRN [id number]  PCP:  Mliss Sax, MD  Cardiologist: Dr. [place]  Chief Complaint  Patient presents with   Chest Pain    Pt stated that he had some chest pain fri Mar 20 2023      History of Present Illness:    Dennis Moon is a 77 y.o. male with past medical history of HTN, HLD, and Stage 3 CKD who is seen for tachycardia. He has a history of CAD. He is also followed by Rheumatology for gout, subacute cutaneous lupus erythematosus, Sjogren's, osteoarthritis, and DDD.   He was  admitted to [facility] from 7/26 - 12/13/2016 for evaluation of chest pain.   EKG was without acute changes but his troponin values peaked at 0.40. A cardiac catheterization was performed which showed 2-vessel obstructive CAD with 90% Prox-LAD stenosis and 80% Prox Cx stenosis. PCI was performed with placement of DES to both the LAD and LCx. He was started on DAPT with ASA and Brilinta along with BB and statin therapy.   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   He was seen in March 2021 after an episode of chest pain while eating cereal. A Myoview study was ordered and was normal. In retrospect he feels this was his reflux.   He presented to [facility] on 03/29/2021 with GERD symptoms/chest pain, ruled in for NSTEMI (HsTroponin 9>170). Cath showed stable 2-vessel CAD, with patent stents to pLAD/pLCx, with 40% mLAD and 20% mRCA stenosis. Medical management advised. Echo showed EF 60-65%, GIDD, mild LAE. He was discharged the following day in stable condition. He was started on metformin for elevated A1c 6.4%. last A1c down to 5.9%.   He states he has been doing well. Noted in his office last week on Tuesday and Thursday that HR was high over 120. Has Mayford Knife that showed possible Afib on one occasion but otherwise OK. Awoke one morning with heartburn unrelieved with antacids but relieved with sl Ntg  x 1. None since. Generally feels well.    Past Medical History:  Diagnosis Date   CAD S/P percutaneous coronary angioplasty 12/12/2016   a. 11/2016: NSTEMI with DES to LAD and DES to LCx; b. MV low risk 2021; c. NSTEMI 11/22, LHC patent stents to LAD/LCx, 40% mLAD, 20% mRCA, stable, med mgmt. Echo 11/22 EF 60-65%, GIDD, mild LAE.   CKD (chronic kidney disease), stage III (HCC)    GERD (gastroesophageal reflux disease)    Gout    History of Clostridioides difficile infection 2024   Hyperlipidemia LDL goal <70    Hypertension    Hypothyroidism    Migraine    Open fracture dislocation of interphalangeal joint of left thumb 10/14/2022   Osteoarthritis    Rotator cuff tear    left   Sjogren's disease (HCC)    Systemic lupus erythematosus (HCC)     Past Surgical History:  Procedure Laterality Date   CATARACT EXTRACTION, BILATERAL Bilateral 2017   COLONOSCOPY  2021   CORONARY STENT INTERVENTION N/A 12/12/2016   Procedure: Coronary Stent Intervention;  Surgeon: Sw[place], Katlyne Nishida M, MD;  Location: [facility] INVASIVE CV LAB;  Service: Cardiovascular;  Laterality: N/A;   HERNIA REPAIR     LEFT HEART CATH AND CORONARY ANGIOGRAPHY N/A 12/12/2016   Procedure: Left Heart Cath and Coronary Angiography;  Surgeon: [place], Yisrael Obryan M, MD;  Location: [facility] INVASIVE CV LAB;  Service: Cardiovascular;  Laterality: N/A;   LEFT HEART CATH AND CORONARY ANGIOGRAPHY N/A 03/29/2021   Procedure: LEFT HEART CATH AND CORONARY ANGIOGRAPHY;  Surgeon: Kathleene Hazel, MD;  Location: [facility] INVASIVE CV LAB;  Service: Cardiovascular;  Laterality: N/A;   THYROID SURGERY  1997    Current Medications: Outpatient Medications Prior to Visit  Medication Sig Dispense Refill   acyclovir cream (ZOVIRAX) 5 % Apply 1 Application topically every 3 (three) hours. 15 g 5   albuterol (VENTOLIN HFA) 108 (90 Base) MCG/ACT inhaler INHALE ONE (1) PUFF BY MOUTH INTO LUNGS EVERY 6 HOURS AS NEEDED FOR WHEEZING AND/OR FOR SHORTNESS OF BREATH *REFILL  REQUEST* 8.5 g 10   allopurinol (ZYLOPRIM) 300 MG tablet TAKE 1 TABLET BY MOUTH EVERY EVENING 90 tablet 0   amLODipine (NORVASC) 5 MG tablet Take 1 tablet (5 mg total) by mouth daily. 90 tablet 3   Ascorbic Acid (VITAMIN C) 1000 MG tablet Take 1,000 mg by mouth every morning.     aspirin-acetaminophen-caffeine (EXCEDRIN EXTRA STRENGTH) 250-250-65 MG tablet Take 2 tablets by mouth 3 (three) times daily as needed for headache or migraine (pain).     atorvastatin (LIPITOR) 80 MG tablet TAKE 1 TABLET(80 MG) BY MOUTH EVERY EVENING 15 tablet 0   Calcium Carb-Cholecalciferol (CALCIUM 600 + D PO) Take 1 tablet by mouth every morning.     cholecalciferol (VITAMIN D) 25 MCG (1000 UNIT) tablet Take 1,000 Units by mouth every morning.     clobetasol cream (TEMOVATE) 0.05 % Apply to affected area twice daily as needed for up to 2 weeks 30 g 0   CRANBERRY EXTRACT PO Take 4,200 mg by mouth 2 (two) times daily.     DULoxetine (CYMBALTA) 60 MG capsule Take 1 capsule (60 mg total) by mouth every evening. 90 capsule 2   EPINEPHrine 0.3 mg/0.3 mL IJ SOAJ injection Inject 0.3 mg into the muscle See admin instructions. 2 each 2   esomeprazole (NEXIUM) 20 MG capsule Take 1 capsule (20 mg total) by mouth 2 (two) times daily before a meal. 90 capsule 3   fluorometholone (FML) 0.1 % ophthalmic suspension Place 1 drop into both eyes daily as needed (dry eyes/irritation).     levocetirizine (XYZAL) 5 MG tablet Take 5 mg by mouth daily as needed (seasonal allergies).     levothyroxine (SYNTHROID) 112 MCG tablet TAKE 1 TABLET BY MOUTH EVERY EVENING 30 tablet 10   metFORMIN (GLUCOPHAGE-XR) 500 MG 24 hr tablet Take 1 tablet (500 mg total) by mouth every evening. 90 tablet 3   Multiple Vitamin (MULTIVITAMIN WITH MINERALS) TABS tablet Take 1 tablet by mouth every morning. One a Day for Men over 50     nitroGLYCERIN (NITROSTAT) 0.4 MG SL tablet DISSOLVE 1 TABLET UNDER THE TONGUE EVERY 5 MINUTES AS NEEDED FOR CHEST PAIN. DO NOT  EXCEED A TOTAL OF 3 DOSES IN 15 MINUTES. 25 tablet 11   Polyethyl Glycol-Propyl Glycol (SYSTANE) 0.4-0.3 % SOLN Place 1 drop into both eyes 2 (two) times daily as needed (dry eyes).     predniSONE (DELTASONE) 50 MG tablet Take 1 tablet daily for 5 days. 5 tablet 0   Probiotic Product (PROBIOTIC PO) Take 1 capsule by mouth every morning.     propranolol (INDERAL) 60 MG tablet TAKE ONE TABLET BY MOUTH TWICE DAILY 180 tablet 4   testosterone (ANDROGEL) 50 MG/5GM (1%) GEL apply FIVE grams (ONE PACKET) onto THE SKIN TWICE DAILY 300 g 4   Ubrogepant (UBRELVY) 50 MG  TABS Take one at first sign of migraine. May repeat once in 2 hours. (Patient taking differently: Take 50 mg by mouth See admin instructions. Take one tablet (50 mg) by mouth at first sign of migraine; may repeat once in 2 hours if still needed) 10 tablet 2   zinc gluconate 50 MG tablet Take 50 mg by mouth every morning.     No facility-administered medications prior to visit.     Allergies:   Bee venom   Social History   Socioeconomic History   Marital status: Married    Spouse name: Not on file   Number of children: Not on file   Years of education: Not on file   Highest education level: Not on file  Occupational History   Occupation: Dentist  Tobacco Use   Smoking status: Former    Current packs/day: 0.00    Average packs/day: 1 pack/day for 6.0 years (6.0 ttl pk-yrs)    Types: Cigarettes    Start date: 04/25/1960    Quit date: 04/25/1966    Years since quitting: 56.9    Passive exposure: Past   Smokeless tobacco: Never  Vaping Use   Vaping status: Never Used  Substance and Sexual Activity   Alcohol use: Yes    Alcohol/week: 7.0 standard drinks of alcohol    Types: 7 Glasses of wine per week   Drug use: Never   Sexual activity: Not on file  Other Topics Concern   Not on file  Social History Narrative   Not on file   Social Determinants of Health   Financial Resource Strain: Low Risk  (12/19/2022)   Overall  Financial Resource Strain (CARDIA)    Difficulty of Paying Living Expenses: Not hard at all  Food Insecurity: No Food Insecurity (12/19/2022)   Hunger Vital Sign    Worried About Running Out of Food in the Last Year: Never true    Ran Out of Food in the Last Year: Never true  Transportation Needs: No Transportation Needs (12/19/2022)   PRAPARE - Administrator, Civil Service (Medical): No    Lack of Transportation (Non-Medical): No  Physical Activity: Insufficiently Active (12/19/2022)   Exercise Vital Sign    Days of Exercise per Week: 2 days    Minutes of Exercise per Session: 20 min  Stress: No Stress Concern Present (12/19/2022)   [employer] of Occupational Health - Occupational Stress Questionnaire    Feeling of Stress : Not at all  Social Connections: Socially Integrated (12/19/2022)   Social Connection and Isolation Panel [NHANES]    Frequency of Communication with Friends and Family: More than three times a week    Frequency of Social Gatherings with Friends and Family: Once a week    Attends Religious Services: More than 4 times per year    Active Member of [employer] or Organizations: Yes    Attends Engineer, structural: More than 4 times per year    Marital Status: Married     Family History:  The patient's family history includes Diabetes in his mother; Hypertension in his father and mother.   Review of Systems:   Please see the history of present illness.     All other systems reviewed and are otherwise negative except as noted above.   Physical Exam:    VS:  BP 122/82 (BP Location: Left Arm, Patient Position: Sitting, Cuff Size: Normal)   Pulse (!) 113   Ht 6' (1.829 m)   Wt 224  lb (101.6 kg)   SpO2 93%   BMI 30.38 kg/m    GENERAL:  Well appearing, overweight WM in NAD HEENT:  PERRL, EOMI, sclera are clear. Oropharynx is clear. NECK:  No jugular venous distention, carotid upstroke brisk and symmetric, no bruits, no thyromegaly or  adenopathy LUNGS:  Clear to auscultation bilaterally CHEST:  Unremarkable HEART:  RRR,  PMI not displaced or sustained,S1 and S2 within normal limits, no S3, no S4: no clicks, no rubs, no murmurs ABD:  Soft, nontender. BS +, no masses or bruits. No hepatomegaly, no splenomegaly EXT:  2 + pulses throughout, no edema, no cyanosis no clubbing SKIN:  Warm and dry.  No rashes NEURO:  Alert and oriented x 3. Cranial nerves II through XII intact. PSYCH:  Cognitively intact   Wt Readings from Last 3 Encounters:  03/23/23 224 lb (101.6 kg)  01/16/23 228 lb 6.4 oz (103.6 kg)  12/19/22 230 lb (104.3 kg)      Studies/Labs Reviewed:   EKG Interpretation Date/Time:  Monday March 23 2023 13:22:07 EST Ventricular Rate:  113 PR Interval:  174 QRS Duration:  138 QT Interval:  346 QTC Calculation: 474 R Axis:   -19  Text Interpretation: Sinus tachycardia with Premature atrial complexes Possible Left atrial enlargement Right bundle branch block When compared with ECG of 29-Mar-2021 14:43, Since last tracing rate faster Right bundle branch block is new. Confirmed by [place], Seletha Zimmermann [phone]) on 03/23/2023 1:26:29 PM      Recent Labs: 01/16/2023: ALT 24; BUN 22; Creat 1.22; Hemoglobin 17.0; Platelets 221; Potassium 4.6; Sodium 142   Lipid Panel    Component Value Date/Time   CHOL 111 02/14/2022 0753   CHOL 119 06/17/2019 0930   TRIG 121.0 02/14/2022 0753   HDL 40.20 02/14/2022 0753   HDL 41 06/17/2019 0930   CHOLHDL 3 02/14/2022 0753   VLDL 24.2 02/14/2022 0753   LDLCALC 46 02/14/2022 0753   LDLCALC 58 06/17/2019 0930    Additional studies/ records that were reviewed today include:   Echocardiogram: 12/12/2016 Study Conclusions   - Left ventricle: The cavity size was normal. Wall thickness was   increased in a pattern of mild LVH. Systolic function was normal.   The estimated ejection fraction was in the range of 60% to 65%.   Wall motion was normal; there were no regional wall  motion   abnormalities. Doppler parameters are consistent with abnormal   left ventricular relaxation (grade 1 diastolic dysfunction). The   E/e&' ratio is <8, suggesting normal LV filling pressure. - Mitral valve: Mildly thickened leaflets . There was trivial   regurgitation. - Left atrium: The atrium was normal in size. - Tricuspid valve: There was trivial regurgitation. - Pulmonic valve: There was mild regurgitation. - Pulmonary arteries: PA peak pressure: 20 mm Hg (S). - Inferior vena cava: The vessel was normal in size. The   respirophasic diameter changes were in the normal range (>= 50%),   consistent with normal central venous pressure.   Impressions:   - LVEF 60-65%, mild LVH, normal wall motion, grade 1 DD, normal LV   filling pressure, normal LA size, trivial MR, TR, RVSP 20 mmHg,   normal IVC.  Cardiac Catheterization: 12/12/2016 Mid LAD lesion, 40 %stenosed. The left ventricular systolic function is normal. LV end diastolic pressure is normal. The left ventricular ejection fraction is 55-65% by visual estimate. Prox LAD lesion, 90 %stenosed. A STENT PROMUS PREM MR 4.0X20 drug eluting stent was successfully placed. Post intervention, there  is a 0% residual stenosis. Ost Cx to Prox Cx lesion, 80 %stenosed. A STENT XIENCE ALPINE RX 3.0X18 drug eluting stent was successfully placed. Post intervention, there is a 0% residual stenosis.   1. 2 vessel obstructive CAD 2. Normal LV function 3. Normal LVEDP 4. Successful stenting of the proximal LAD with DES 5. Successful stenting of the proximal LCx with DES.   Plan: DAPT for one year. Anticipate DC in am.   Myoview 07/29/19: Study Highlights  The left ventricular ejection fraction is mildly decreased (45-54%). Nuclear stress EF: 54%. There was no ST segment deviation noted during stress. The study is normal. This is a low risk study.   Cardiac cath 03/29/21:  LEFT HEART CATH AND CORONARY ANGIOGRAPHY    Conclusion      Prox RCA lesion is 20% stenosed.   Mid LAD lesion is 40% stenosed.   Previously placed Ost Cx to Prox Cx stent (unknown type) is  widely patent.   Previously placed Ost LAD to Prox LAD stent (unknown type) is  widely patent.   Stable two vessel CAD Patent proximal LAD stent with mild mid LAD stenosis beyond the stent Patent proximal circumflex stent Mild plaque mid RCA.    Recommendations: Continue medical management of CAD. Echo in the am. Monitor on telemetry tonight.   Dominance: Right Intervention   Echo 03/30/21: IMPRESSIONS     1. Left ventricular ejection fraction, by estimation, is 60 to 65%. The  left ventricle has normal function. The left ventricle has no regional  wall motion abnormalities. There is mild left ventricular hypertrophy.  Left ventricular diastolic parameters  are consistent with Grade I diastolic dysfunction (impaired relaxation).   2. Right ventricular systolic function is normal. The right ventricular  size is normal.   3. Left atrial size was mildly dilated.   4. The mitral valve is normal in structure. No evidence of mitral valve  regurgitation. No evidence of mitral stenosis.   5. The aortic valve is normal in structure. There is moderate  calcification of the aortic valve. There is mild thickening of the aortic  valve. Aortic valve regurgitation is not visualized. Aortic valve  sclerosis/calcification is present, without any  evidence of aortic stenosis.   6. The inferior vena cava is normal in size with greater than 50%  respiratory variability, suggesting right atrial pressure of 3 mmHg.     Assessment:    1. Atrial tachycardia (HCC)   2. Essential hypertension   3. Stage 3 chronic kidney disease, unspecified whether stage 3a or 3b CKD (HCC)   4. Coronary artery disease involving native coronary artery of native heart without angina pectoris   5. Grade I diastolic dysfunction   6. Hyperlipidemia LDL goal <70         Plan:   In order of problems listed above:  1. Tachycardia. Ecg appears to be sinus tachycardia but unusual since he is on Inderal 60 mg bid. This could be an ectopic atrial tachycardia. Will check lab work today including CBC, TSH and chemistries. Will increase Inderal to 60 mg tid. Will have him wear a 2 week Zio patch monitor. Follow up after these results.   2. CAD  - s/p NSTEMI in July 2018. S/p  DES to both the LAD and LCx. Echo showed a preserved EF of 60-65% with no regional WMA.  -  Myoview March 2021 was normal.  - admitted with chest pain and mild troponin leak in November 2022. Cath with no  obstructive disease. Normal EF. Rare recurrent chest pain - on ASA monotherapy.  - rare chest pain  3. HTN - well controlled.   4. HLD - well controlled on high dose statin. Repeat labs today.  5. Stage 3 CKD - creatinine stable. Followed by Dr. Marisue Humble.  6. DM now on metformin with improvement.    Signed, Nykolas Bacallao Swaziland, MD  03/23/2023 1:49 PM    [facility] [address] [place], [place] [address] Phone: [phone]

## 2023-03-23 NOTE — Telephone Encounter (Signed)
STAT if HR is under 50 or over 120 (normal HR is 60-100 beats per minute)  What is your heart rate? 125  Do you have a log of your heart rate readings (document readings)? No  Do you have any other symptoms? No

## 2023-03-23 NOTE — Telephone Encounter (Signed)
Spoke to patient who reports while he was at the dentist last Friday they noticed increased heart rate and irregular heart beat. He is calling this morning with the same. He deny symptoms at this time, His current heart rate is 125 and BP 120/65. Spoke with Dr [place] who wants to see patient in the office. Spoke to patients wife who stated they will be here in 45 minutes to an hour.

## 2023-03-23 NOTE — Progress Notes (Unsigned)
Enrolled for [employer] to mail a ZIO XT long term holter monitor to the patients address on file.  

## 2023-03-24 ENCOUNTER — Encounter: Payer: Self-pay | Admitting: Family Medicine

## 2023-03-24 LAB — CBC
Hematocrit: 52.7 % — ABNORMAL HIGH (ref 37.5–51.0)
Hemoglobin: 17 g/dL (ref 13.0–17.7)
MCH: 30.4 pg (ref 26.6–33.0)
MCHC: 32.3 g/dL (ref 31.5–35.7)
MCV: 94 fL (ref 79–97)
Platelets: 247 10*3/uL (ref 150–450)
RBC: 5.6 x10E6/uL (ref 4.14–5.80)
RDW: 12.9 % (ref 11.6–15.4)
WBC: 7.6 10*3/uL (ref 3.4–10.8)

## 2023-03-24 LAB — COMPREHENSIVE METABOLIC PANEL
ALT: 33 [IU]/L (ref 0–44)
AST: 35 [IU]/L (ref 0–40)
Albumin: 4.2 g/dL (ref 3.8–4.8)
Alkaline Phosphatase: 114 [IU]/L (ref 44–121)
BUN/Creatinine Ratio: 18 (ref 10–24)
BUN: 25 mg/dL (ref 8–27)
Bilirubin Total: 0.3 mg/dL (ref 0.0–1.2)
CO2: 28 mmol/L (ref 20–29)
Calcium: 9.4 mg/dL (ref 8.6–10.2)
Chloride: 98 mmol/L (ref 96–106)
Creatinine, Ser: 1.39 mg/dL — ABNORMAL HIGH (ref 0.76–1.27)
Globulin, Total: 3.3 g/dL (ref 1.5–4.5)
Glucose: 108 mg/dL — ABNORMAL HIGH (ref 70–99)
Potassium: 4.4 mmol/L (ref 3.5–5.2)
Sodium: 143 mmol/L (ref 134–144)
Total Protein: 7.5 g/dL (ref 6.0–8.5)
eGFR: 52 mL/min/{1.73_m2} — ABNORMAL LOW (ref >59)

## 2023-03-24 LAB — LIPID PANEL
Chol/HDL Ratio: 2.7 ratio (ref 0.0–5.0)
Cholesterol, Total: 125 mg/dL (ref 100–199)
HDL: 47 mg/dL (ref >39)
LDL Chol Calc (NIH): 55 mg/dL (ref 0–99)
Triglycerides: 133 mg/dL (ref 0–149)
VLDL Cholesterol Cal: 23 mg/dL (ref 5–40)

## 2023-03-24 LAB — TSH: TSH: 1.25 u[IU]/mL (ref 0.450–4.500)

## 2023-03-31 NOTE — Progress Notes (Signed)
Cardiology Clinic Note   Patient Name: Dennis Moon Date of Encounter: 04/03/2023  Primary Care Provider:  Mliss Sax, MD Primary Cardiologist:  Peter Swaziland, MD  Patient Profile    Dennis Moon 77 year old male presents to the clinic today for follow-up evaluation of his atrial tachycardia and coronary artery disease.  Past Medical History    Past Medical History:  Diagnosis Date   CAD S/P percutaneous coronary angioplasty 12/12/2016   a. 11/2016: NSTEMI with DES to LAD and DES to LCx; b. MV low risk 2021; c. NSTEMI 11/22, LHC patent stents to LAD/LCx, 40% mLAD, 20% mRCA, stable, med mgmt. Echo 11/22 EF 60-65%, GIDD, mild LAE.   CKD (chronic kidney disease), stage III (HCC)    GERD (gastroesophageal reflux disease)    Gout    History of Clostridioides difficile infection 2024   Hyperlipidemia LDL goal <70    Hypertension    Hypothyroidism    Migraine    Open fracture dislocation of interphalangeal joint of left thumb 10/14/2022   Osteoarthritis    Rotator cuff tear    left   Sjogren's disease (HCC)    Systemic lupus erythematosus (HCC)    Past Surgical History:  Procedure Laterality Date   CATARACT EXTRACTION, BILATERAL Bilateral 2017   COLONOSCOPY  2021   CORONARY STENT INTERVENTION N/A 12/12/2016   Procedure: Coronary Stent Intervention;  Surgeon: Swaziland, Peter M, MD;  Location: [facility] INVASIVE CV LAB;  Service: Cardiovascular;  Laterality: N/A;   HERNIA REPAIR     LEFT HEART CATH AND CORONARY ANGIOGRAPHY N/A 12/12/2016   Procedure: Left Heart Cath and Coronary Angiography;  Surgeon: Swaziland, Peter M, MD;  Location: [facility] INVASIVE CV LAB;  Service: Cardiovascular;  Laterality: N/A;   LEFT HEART CATH AND CORONARY ANGIOGRAPHY N/A 03/29/2021   Procedure: LEFT HEART CATH AND CORONARY ANGIOGRAPHY;  Surgeon: Kathleene Hazel, MD;  Location: [facility] INVASIVE CV LAB;  Service: Cardiovascular;  Laterality: N/A;   THYROID SURGERY  1997     Allergies  Allergies  Allergen Reactions   Bee Venom Swelling    Facial, hand and foot swelling    History of Present Illness    Dennis Moon has a PMH of HLD, HTN, CKD stage III G1 DD and atrial tachycardia.  His PMH also includes gout, lupus, Sjogren's, OA, and degenerative disc disease.  He was [facility] to [facility] for evaluation of chest discomfort.  His EKG showed no acute changes in his troponins.  However, cardiac troponins peaked at 0.40.  Cardiac catheterization was performed and showed two-vessel obstructive coronary disease with 90% proximal-LAD stenosis and 80% proximal circumflex stenosis.  PCI was performed.  And he received DES to his LAD and circumflex.  He was placed on aspirin and Brilinta.  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  He had subsequent chest discomfort and nuclear stress testing was ordered.  Testing was normal.  It was felt that his symptoms were related to reflux.  He presented to [facility] 03/29/2021 with GERD symptoms and chest pain.  He was ruled in for NSTEMI.  He underwent repeat catheterization which showed patent stents of his LAD and circumflex.  He was noted to have 40% mid LAD and 20% mid RCA stenosis.  Medical management was recommended.  Echocardiogram at that time showed an EF of 60-65%, G1 DD and mild left atrial enlargement.  He was seen in follow-up by Dr. Swaziland on 03/23/2023.  During that  time he was doing well.  He did report that his heart rate in the office on Tuesday and Thursday had been over 120.  His Kardia mobile device showed possible atrial fibrillation on 1 occasion.  Otherwise his rhythms were noted to be okay.  He woke up 1 morning with heartburn which was unrelieved by antacids but relieved by sublingual nitroglycerin x 1.  He denied further episodes of chest discomfort.  He was generally doing well.  He presents to the clinic today for follow-up evaluation and states he figured out that his  pharmacy mail order service had not included his beta-blocker medication.  Since being back on his propranolol his heart rate has returned to normal.  He has been monitoring his blood pressure and heart rate at home.  He brings in a log today.  He is a pleasant gentleman.  We discussed dentistry and the fluoride and [place] water sources.  I will continue his current medication regimen and plan follow-up in 6 months..  Today he denies chest pain, shortness of breath, and palpitations.   Home Medications    Prior to Admission medications   Medication Sig Start Date End Date Taking? Authorizing Provider  acyclovir cream (ZOVIRAX) 5 % Apply 1 Application topically every 3 (three) hours. 02/07/22   Mliss Sax, MD  albuterol (VENTOLIN HFA) 108 (90 Base) MCG/ACT inhaler INHALE ONE (1) PUFF BY MOUTH INTO LUNGS EVERY 6 HOURS AS NEEDED FOR WHEEZING AND/OR FOR SHORTNESS OF BREATH *REFILL REQUEST* 01/23/23   Mliss Sax, MD  allopurinol (ZYLOPRIM) 300 MG tablet TAKE 1 TABLET BY MOUTH EVERY EVENING 01/22/23   Rice, Jamesetta Orleans, MD  amLODipine (NORVASC) 5 MG tablet Take 1 tablet (5 mg total) by mouth daily. 01/23/23   Mliss Sax, MD  Ascorbic Acid (VITAMIN C) 1000 MG tablet Take 1,000 mg by mouth every morning.    [provider]  aspirin-acetaminophen-caffeine (EXCEDRIN EXTRA STRENGTH) [phone] MG tablet Take 2 tablets by mouth 3 (three) times daily as needed for headache or migraine (pain).    [provider]  atorvastatin (LIPITOR) 80 MG tablet TAKE 1 TABLET(80 MG) BY MOUTH EVERY EVENING 01/26/23   Swaziland, Peter M, MD  Calcium Carb-Cholecalciferol (CALCIUM 600 + D PO) Take 1 tablet by mouth every morning.    [provider]  cholecalciferol (VITAMIN D) 25 MCG (1000 UNIT) tablet Take 1,000 Units by mouth every morning.    [provider]  clobetasol cream (TEMOVATE) 0.05 % Apply to affected area twice daily as needed for up to 2 weeks 02/07/22    Mliss Sax, MD  CRANBERRY EXTRACT PO Take 4,200 mg by mouth 2 (two) times daily.    [provider]  DULoxetine (CYMBALTA) 60 MG capsule Take 1 capsule (60 mg total) by mouth every evening. 01/23/23   Mliss Sax, MD  EPINEPHrine 0.3 mg/0.3 mL IJ SOAJ injection Inject 0.3 mg into the muscle See admin instructions. 02/12/23   Mliss Sax, MD  esomeprazole (NEXIUM) 20 MG capsule Take 1 capsule (20 mg total) by mouth 2 (two) times daily before a meal. 01/23/23   Mliss Sax, MD  fluorometholone (FML) 0.1 % ophthalmic suspension Place 1 drop into both eyes daily as needed (dry eyes/irritation). 05/14/20   [provider]  levocetirizine (XYZAL) 5 MG tablet Take 5 mg by mouth daily as needed (seasonal allergies).    [provider]  levothyroxine (SYNTHROID) 112 MCG tablet TAKE 1 TABLET BY MOUTH EVERY  EVENING 02/27/23   Mliss Sax, MD  metFORMIN (GLUCOPHAGE-XR) 500 MG 24 hr tablet Take 1 tablet (500 mg total) by mouth every evening. 01/23/23   Mliss Sax, MD  Multiple Vitamin (MULTIVITAMIN WITH MINERALS) TABS tablet Take 1 tablet by mouth every morning. One a Day for Men over 57    [provider]  nitroGLYCERIN (NITROSTAT) 0.4 MG SL tablet DISSOLVE 1 TABLET UNDER THE TONGUE EVERY 5 MINUTES AS NEEDED FOR CHEST PAIN. DO NOT EXCEED A TOTAL OF 3 DOSES IN 15 MINUTES. 09/26/22   Swaziland, Peter M, MD  Polyethyl Glycol-Propyl Glycol (SYSTANE) 0.4-0.3 % SOLN Place 1 drop into both eyes 2 (two) times daily as needed (dry eyes).    [provider]  predniSONE (DELTASONE) 50 MG tablet Take 1 tablet daily for 5 days. 07/08/22   Garnette Gunner, MD  Probiotic Product (PROBIOTIC PO) Take 1 capsule by mouth every morning.    [provider]  propranolol (INDERAL) 60 MG tablet TAKE ONE TABLET BY MOUTH TWICE DAILY 01/23/23   Mliss Sax, MD  testosterone (ANDROGEL) 50 MG/5GM (1%) GEL apply FIVE grams  (ONE PACKET) onto THE SKIN TWICE DAILY 03/10/23   Mliss Sax, MD  Ubrogepant (UBRELVY) 50 MG TABS Take one at first sign of migraine. May repeat once in 2 hours. Patient taking differently: Take 50 mg by mouth See admin instructions. Take one tablet (50 mg) by mouth at first sign of migraine; may repeat once in 2 hours if still needed 09/07/20   Mliss Sax, MD  zinc gluconate 50 MG tablet Take 50 mg by mouth every morning.    [provider]    Family History    Family History  Problem Relation Age of Onset   Diabetes Mother    Hypertension Mother    Hypertension Father    Healthy Son    He indicated that his mother is deceased. He indicated that his father is deceased. He indicated that his maternal grandmother is deceased. He indicated that his maternal grandfather is deceased. He indicated that his paternal grandmother is deceased. He indicated that his paternal grandfather is deceased. He indicated that his son is alive.  Social History    Social History   Socioeconomic History   Marital status: Married    Spouse name: Not on file   Number of children: Not on file   Years of education: Not on file   Highest education level: Not on file  Occupational History   Occupation: Dentist  Tobacco Use   Smoking status: Former    Current packs/day: 0.00    Average packs/day: 1 pack/day for 6.0 years (6.0 ttl pk-yrs)    Types: Cigarettes    Start date: 04/25/1960    Quit date: 04/25/1966    Years since quitting: 56.9    Passive exposure: Past   Smokeless tobacco: Never  Vaping Use   Vaping status: Never Used  Substance and Sexual Activity   Alcohol use: Yes    Alcohol/week: 7.0 standard drinks of alcohol    Types: 7 Glasses of wine per week   Drug use: Never   Sexual activity: Not on file  Other Topics Concern   Not on file  Social History Narrative   Not on file   Social Determinants of Health   Financial Resource Strain: Low Risk   (12/19/2022)   Overall Financial Resource Strain (CARDIA)    Difficulty of Paying Living Expenses: Not hard at all  Food Insecurity: No Food Insecurity (12/19/2022)   Hunger Vital Sign    Worried About Running Out of Food in the Last Year: Never true    Ran Out of Food in the Last Year: Never true  Transportation Needs: No Transportation Needs (12/19/2022)   PRAPARE - Administrator, Civil Service (Medical): No    Lack of Transportation (Non-Medical): No  Physical Activity: Insufficiently Active (12/19/2022)   Exercise Vital Sign    Days of Exercise per Week: 2 days    Minutes of Exercise per Session: 20 min  Stress: No Stress Concern Present (12/19/2022)   [employer] of Occupational Health - Occupational Stress Questionnaire    Feeling of Stress : Not at all  Social Connections: Socially Integrated (12/19/2022)   Social Connection and Isolation Panel [NHANES]    Frequency of Communication with Friends and Family: More than three times a week    Frequency of Social Gatherings with Friends and Family: Once a week    Attends Religious Services: More than 4 times per year    Active Member of [employer] or Organizations: Yes    Attends Engineer, structural: More than 4 times per year    Marital Status: Married  Catering manager Violence: Not At Risk (12/19/2022)   Humiliation, Afraid, Rape, and Kick questionnaire    Fear of Current or Ex-Partner: No    Emotionally Abused: No    Physically Abused: No    Sexually Abused: No     Review of Systems    General:  No chills, fever, night sweats or weight changes.  Cardiovascular:  No chest pain, dyspnea on exertion, edema, orthopnea, palpitations, paroxysmal nocturnal dyspnea. Dermatological: No rash, lesions/masses Respiratory: No cough, dyspnea Urologic: No hematuria, dysuria Abdominal:   No nausea, vomiting, diarrhea, bright red blood per rectum, melena, or hematemesis Neurologic:  No visual changes, wkns, changes in mental  status. All other systems reviewed and are otherwise negative except as noted above.  Physical Exam    VS:  BP 128/72 (BP Location: Left Arm, Patient Position: Sitting, Cuff Size: Normal)   Pulse 61   Ht 6' (1.829 m)   Wt 227 lb (103 kg)   BMI 30.79 kg/m  , BMI Body mass index is 30.79 kg/m. GEN: Well nourished, well developed, in no acute distress. HEENT: normal. Neck: Supple, no JVD, carotid bruits, or masses. Cardiac: RRR, no murmurs, rubs, or gallops. No clubbing, cyanosis, edema.  Radials/DP/PT 2+ and equal bilaterally.  Respiratory:  Respirations regular and unlabored, clear to auscultation bilaterally. GI: Soft, nontender, nondistended, BS + x 4. MS: no deformity or atrophy. Skin: warm and dry, no rash. Neuro:  Strength and sensation are intact. Psych: Normal affect.  Accessory Clinical Findings    Recent Labs: 03/23/2023: ALT 33; BUN 25; Creatinine, Ser 1.39; Hemoglobin 17.0; Platelets 247; Potassium 4.4; Sodium 143; TSH 1.250   Recent Lipid Panel    Component Value Date/Time   CHOL 125 03/23/2023 1400   TRIG 133 03/23/2023 1400   HDL 47 03/23/2023 1400   CHOLHDL 2.7 03/23/2023 1400   CHOLHDL 3 02/14/2022 0753   VLDL 24.2 02/14/2022 0753   LDLCALC 55 03/23/2023 1400         ECG personally reviewed by me today-none today.   Cardiac cath 03/29/21:   LEFT HEART CATH AND CORONARY ANGIOGRAPHY    Conclusion       Prox RCA lesion is 20% stenosed.   Mid LAD lesion is 40% stenosed.  Previously placed Ost Cx to Prox Cx stent (unknown type) is  widely patent.   Previously placed Ost LAD to Prox LAD stent (unknown type) is  widely patent.   Stable two vessel CAD Patent proximal LAD stent with mild mid LAD stenosis beyond the stent Patent proximal circumflex stent Mild plaque mid RCA.    Recommendations: Continue medical management of CAD. Echo in the am. Monitor on telemetry tonight.   Dominance: Right Intervention     Echo 03/30/21: IMPRESSIONS      1. Left ventricular ejection fraction, by estimation, is 60 to 65%. The  left ventricle has normal function. The left ventricle has no regional  wall motion abnormalities. There is mild left ventricular hypertrophy.  Left ventricular diastolic parameters  are consistent with Grade I diastolic dysfunction (impaired relaxation).   2. Right ventricular systolic function is normal. The right ventricular  size is normal.   3. Left atrial size was mildly dilated.   4. The mitral valve is normal in structure. No evidence of mitral valve  regurgitation. No evidence of mitral stenosis.   5. The aortic valve is normal in structure. There is moderate  calcification of the aortic valve. There is mild thickening of the aortic  valve. Aortic valve regurgitation is not visualized. Aortic valve  sclerosis/calcification is present, without any  evidence of aortic stenosis.   6. The inferior vena cava is normal in size with greater than 50%  respiratory variability, suggesting right atrial pressure of 3 mmHg.        Assessment & Plan   1.  Coronary artery disease-no chest pain today.  Denies recent episodes of arm neck back or chest discomfort.  Cardiac catheterization 11/22 showed proximal RCA 20%, mid LAD 40%, and patent LAD and circumflex stents.  Echocardiogram reassuring.  Details above. Continue amlodipine, atorvastatin, propranolol Heart healthy low-sodium diet Maintain physical activity  Atrial tachycardia-heart rate today 61 bpm.  Noted that his mail pharmacy had not been including his propranolol.  After returning to propranolol dosing his heart rate has returned to low 60s and 70s.   Continue propranolol Avoid triggers caffeine, chocolate, EtOH, dehydration etc.  Hyperlipidemia-LDL 55 on 03/23/2023. High-fiber diet Maintain physical activity Continue atorvastatin  G1 DD-euvolemic today.  Weight stable.  Echocardiogram 11/22 showed stable EF and G1 DD. Heart healthy low-sodium  diet Continue current medical therapy   Disposition: Follow-up with Dr. Swaziland or me in  6 months.   Thomasene Ripple. Chane Magner NP-C     04/03/2023, 11:28 AM [facility] [address] Office [phone] Fax [phone]    I spent 14 minutes examining this patient, reviewing medications, and using patient centered shared decision making involving her cardiac care.   I spent greater than 20 minutes reviewing her past medical history,  medications, and prior cardiac tests.

## 2023-04-03 ENCOUNTER — Encounter: Payer: Self-pay | Admitting: General Practice

## 2023-04-03 ENCOUNTER — Ambulatory Visit: Payer: Medicare Other | Attending: General Practice | Admitting: General Practice

## 2023-04-03 VITALS — BP 128/72 | HR 61 | Ht 72.0 in | Wt 227.0 lb

## 2023-04-03 DIAGNOSIS — R000 Tachycardia, unspecified: Secondary | ICD-10-CM

## 2023-04-03 DIAGNOSIS — E785 Hyperlipidemia, unspecified: Secondary | ICD-10-CM | POA: Diagnosis not present

## 2023-04-03 DIAGNOSIS — I2510 Atherosclerotic heart disease of native coronary artery without angina pectoris: Secondary | ICD-10-CM

## 2023-04-03 DIAGNOSIS — I5189 Other ill-defined heart diseases: Secondary | ICD-10-CM

## 2023-04-03 NOTE — Patient Instructions (Signed)
Medication Instructions:  No changes today. *If you need a refill on your cardiac medications before your next appointment, please call your pharmacy*   Follow-Up: At [facility], you and your health needs are our priority.  As part of our continuing mission to provide you with exceptional heart care, we have created designated Provider Care Teams.  These Care Teams include your primary Cardiologist (physician) and Advanced Practice Providers (APPs -  Physician Assistants and Nurse Practitioners) who all work together to provide you with the care you need, when you need it.  We recommend signing up for the patient portal called "MyChart".  Sign up information is provided on this After Visit Summary.  MyChart is used to connect with patients for Virtual Visits (Telemedicine).  Patients are able to view lab/test results, encounter notes, upcoming appointments, etc.  Non-urgent messages can be sent to your provider as well.   To learn more about what you can do with MyChart, go to [email or web address].    Your next appointment:   6 month(s)  Provider:   Edd Fabian, FNP

## 2023-04-07 ENCOUNTER — Other Ambulatory Visit: Payer: Self-pay

## 2023-04-07 ENCOUNTER — Telehealth: Payer: Self-pay | Admitting: Family Medicine

## 2023-04-07 DIAGNOSIS — B001 Herpesviral vesicular dermatitis: Secondary | ICD-10-CM

## 2023-04-07 MED ORDER — ACYCLOVIR 5 % EX CREA: 1.0000 | TOPICAL_CREAM | CUTANEOUS | 5 refills | Status: DC

## 2023-04-07 NOTE — Telephone Encounter (Signed)
Refill request /pharmacy change  acyclovir cream (ZOVIRAX) 5 % [[id number]]   Change pharmacy to   [facility], [place] - [address], [place] [address] Phone: [phone]  Fax: [phone]

## 2023-04-28 ENCOUNTER — Other Ambulatory Visit: Payer: Self-pay | Admitting: Family Medicine

## 2023-04-28 ENCOUNTER — Other Ambulatory Visit: Payer: Self-pay | Admitting: Internal Medicine

## 2023-04-28 DIAGNOSIS — Z91030 Bee allergy status: Secondary | ICD-10-CM

## 2023-04-29 NOTE — Telephone Encounter (Signed)
Last Fill: 01/22/2023  Labs: 03/23/2023  Glucose 108 Creatinine 1.39 eGFR 52 Hematocrit 52.7  01/16/2023 Uric Acid 4.2  Next Visit: 07/24/2023  Last Visit: 01/16/2023  DX: Idiopathic chronic gout of multiple sites without tophus   Current Dose per office note 01/16/2023: allopurinol 300 mg 1 tablet by mouth daily   Okay to refill Allopurinol?

## 2023-05-01 DIAGNOSIS — L821 Other seborrheic keratosis: Secondary | ICD-10-CM | POA: Diagnosis not present

## 2023-05-01 DIAGNOSIS — D2221 Melanocytic nevi of right ear and external auricular canal: Secondary | ICD-10-CM | POA: Diagnosis not present

## 2023-05-01 DIAGNOSIS — D1801 Hemangioma of skin and subcutaneous tissue: Secondary | ICD-10-CM | POA: Diagnosis not present

## 2023-05-01 DIAGNOSIS — D224 Melanocytic nevi of scalp and neck: Secondary | ICD-10-CM | POA: Diagnosis not present

## 2023-05-01 DIAGNOSIS — L308 Other specified dermatitis: Secondary | ICD-10-CM | POA: Diagnosis not present

## 2023-05-01 DIAGNOSIS — L570 Actinic keratosis: Secondary | ICD-10-CM | POA: Diagnosis not present

## 2023-05-01 DIAGNOSIS — D225 Melanocytic nevi of trunk: Secondary | ICD-10-CM | POA: Diagnosis not present

## 2023-05-01 DIAGNOSIS — L814 Other melanin hyperpigmentation: Secondary | ICD-10-CM | POA: Diagnosis not present

## 2023-05-08 ENCOUNTER — Other Ambulatory Visit: Payer: Self-pay | Admitting: Internal Medicine

## 2023-06-05 MED ORDER — ALLOPURINOL 300 MG PO TABS: 300.0000 mg | ORAL_TABLET | Freq: Every evening | ORAL | 2 refills | Status: DC

## 2023-06-05 NOTE — Addendum Note (Signed)
Addended by: Henriette Combs on: 06/05/2023 04:20 PM   Modules accepted: Orders

## 2023-06-10 ENCOUNTER — Other Ambulatory Visit: Payer: Self-pay | Admitting: Family Medicine

## 2023-06-10 ENCOUNTER — Telehealth: Payer: Self-pay

## 2023-06-10 DIAGNOSIS — B001 Herpesviral vesicular dermatitis: Secondary | ICD-10-CM

## 2023-06-10 NOTE — Telephone Encounter (Signed)
Patient contacted the office inquiring about his Allopurinol. Advised the patient his medication was sent into [facility] pharmacy on 06/05/2023. Patient states he will contact his pharmacy.

## 2023-06-12 ENCOUNTER — Other Ambulatory Visit: Payer: Self-pay | Admitting: Family Medicine

## 2023-06-12 DIAGNOSIS — E291 Testicular hypofunction: Secondary | ICD-10-CM

## 2023-06-12 NOTE — Telephone Encounter (Unsigned)
Copied from CRM [id number]. Topic: Clinical - Prescription Issue >> Jun 12, 2023  3:38 PM Dennis Moon wrote: Reason for CRM: Patient states his insurance will only cover acyclovir cream (ZOVIRAX) 5 % IF it is the ointment type - [facility]  Patient states he needs a prior authorization for testosterone (ANDROGEL) 50 MG/5GM (1%) - Please send to [facility] [address] - [place], [place] - [address] [place]. AT [facility] [address]

## 2023-06-15 ENCOUNTER — Other Ambulatory Visit (HOSPITAL_COMMUNITY): Payer: Self-pay

## 2023-06-17 ENCOUNTER — Telehealth: Payer: Self-pay

## 2023-06-17 ENCOUNTER — Other Ambulatory Visit (HOSPITAL_COMMUNITY): Payer: Self-pay

## 2023-06-17 NOTE — Telephone Encounter (Signed)
Pharmacy Patient Advocate Encounter   Received notification from RX Request Messages that prior authorization for Testosterone 1% gel is required/requested.   Insurance verification completed.   The patient is insured through  [place] Part D  .   Per test claim: The current 30 day co-pay is, $168.75.  No PA needed at this time. This test claim was processed through [facility]- copay amounts may vary at other pharmacies due to pharmacy/plan contracts, or as the patient moves through the different stages of their insurance plan.     Placed a call to [facility] pharmacy and spoke with Danielle. She stated the copay is $168 due to a deductible and they have the order on hold in the computer when he is ready to get it filled.

## 2023-06-17 NOTE — Telephone Encounter (Addendum)
Copied from CRM [id number]. Topic: Clinical - Prescription Issue >> Jun 17, 2023 10:46 AM Fredrich Romans wrote: Reason for CRM: patient would like to know if he could have a prescription for the gel pump testosterone (ANDROGEL) 50 MG/5GM (1%) GEL sent over to pharmacy for him? He said the original prescription that he has been getting for years the cost has doubled,and he said that the gel pumps are cheaper.He said they come in 88g of 1.62 % testosterone. He usually gets 2 boxes and think that this dosage would be equivalent.   [id number] [place], [place] - [address]  Phone: [phone] Fax: [phone]  Forwarding message above  LOV 11/21/2022/ last RF 03/06/2023  DOC of the day Dr. Veto Kemps MD

## 2023-06-18 ENCOUNTER — Telehealth: Payer: Self-pay

## 2023-06-18 NOTE — Addendum Note (Signed)
Addended by: Nadene Rubins A on: 06/18/2023 11:59 AM   Modules accepted: Orders

## 2023-06-18 NOTE — Telephone Encounter (Signed)
Called patient to schedule follow up appointment; no answer left VM for call back

## 2023-06-18 NOTE — Telephone Encounter (Signed)
Copied from CRM [id number]. Topic: Clinical - Prescription Issue >> Jun 18, 2023  2:01 PM Fredrich Romans wrote: Reason for CRM: patient returned call stating that  the insurance deductible is not the issue that he is having with obtaining the testosterone medication.He just need the doctor to send in the gel pumps for him,because he said tat they will be cheaper. He said that with the good rx gold card he can [facility] this gel [facility] ,[facility]    Please review above and advise.  Thanks.  Dm/cma

## 2023-06-18 NOTE — Telephone Encounter (Signed)
My chart message sent to patient to call and make an appointment. Dm/cma

## 2023-06-26 ENCOUNTER — Telehealth: Payer: Self-pay

## 2023-06-26 ENCOUNTER — Ambulatory Visit (INDEPENDENT_AMBULATORY_CARE_PROVIDER_SITE_OTHER): Payer: Medicare Other | Admitting: Family Medicine

## 2023-06-26 ENCOUNTER — Encounter: Payer: Self-pay | Admitting: Family Medicine

## 2023-06-26 VITALS — BP 122/80 | HR 75 | Temp 98.0°F | Ht 72.0 in | Wt 224.2 lb

## 2023-06-26 DIAGNOSIS — T887XXA Unspecified adverse effect of drug or medicament, initial encounter: Secondary | ICD-10-CM

## 2023-06-26 DIAGNOSIS — R7303 Prediabetes: Secondary | ICD-10-CM

## 2023-06-26 DIAGNOSIS — E291 Testicular hypofunction: Secondary | ICD-10-CM

## 2023-06-26 DIAGNOSIS — Z125 Encounter for screening for malignant neoplasm of prostate: Secondary | ICD-10-CM

## 2023-06-26 DIAGNOSIS — E039 Hypothyroidism, unspecified: Secondary | ICD-10-CM | POA: Diagnosis not present

## 2023-06-26 DIAGNOSIS — G8929 Other chronic pain: Secondary | ICD-10-CM

## 2023-06-26 LAB — URINALYSIS, ROUTINE W REFLEX MICROSCOPIC
Bilirubin Urine: NEGATIVE
Hgb urine dipstick: NEGATIVE
Leukocytes,Ua: NEGATIVE
Nitrite: NEGATIVE
RBC / HPF: NONE SEEN (ref 0–?)
Specific Gravity, Urine: 1.025 (ref 1.000–1.030)
Total Protein, Urine: 100 — AB
Urine Glucose: NEGATIVE
Urobilinogen, UA: 0.2 (ref 0.0–1.0)
WBC, UA: NONE SEEN (ref 0–?)
pH: 6.5 (ref 5.0–8.0)

## 2023-06-26 LAB — BASIC METABOLIC PANEL
BUN: 22 mg/dL (ref 6–23)
CO2: 36 meq/L — ABNORMAL HIGH (ref 19–32)
Calcium: 8.8 mg/dL (ref 8.4–10.5)
Chloride: 99 meq/L (ref 96–112)
Creatinine, Ser: 1.35 mg/dL (ref 0.40–1.50)
GFR: 50.11 mL/min — ABNORMAL LOW (ref >60.00)
Glucose, Bld: 90 mg/dL (ref 70–99)
Potassium: 4.5 meq/L (ref 3.5–5.1)
Sodium: 145 meq/L (ref 135–145)

## 2023-06-26 LAB — CBC
HCT: 52.3 % — ABNORMAL HIGH (ref 39.0–52.0)
Hemoglobin: 16.9 g/dL (ref 13.0–17.0)
MCHC: 32.4 g/dL (ref 30.0–36.0)
MCV: 95.9 fL (ref 78.0–100.0)
Platelets: 197 10*3/uL (ref 150.0–400.0)
RBC: 5.45 Mil/uL (ref 4.22–5.81)
RDW: 16.8 % — ABNORMAL HIGH (ref 11.5–15.5)
WBC: 6.4 10*3/uL (ref 4.0–10.5)

## 2023-06-26 LAB — TSH: TSH: 2.95 u[IU]/mL (ref 0.35–5.50)

## 2023-06-26 LAB — PSA: PSA: 1.62 ng/mL (ref 0.10–4.00)

## 2023-06-26 MED ORDER — DULOXETINE HCL 60 MG PO CPEP: 60.0000 mg | ORAL_CAPSULE | Freq: Every evening | ORAL | 2 refills | Status: DC

## 2023-06-26 NOTE — Patient Instructions (Signed)
 Visit Information  Thank you for taking time to visit with me today. Please don't hesitate to contact me if I can be of assistance to you.   Following are the goals we discussed today:   Goals Addressed             This Visit's Progress    COMPLETED: Care coordination activities-No follow up required       Care Coordination Interventions: Discussed services and support. Advised to discuss with primary care physician if services needed in the future.          If you are experiencing a Mental Health or Behavioral Health Crisis or need someone to talk to, please call the Suicide and Crisis Lifeline: 988   Patient verbalizes understanding of instructions and care plan provided today and agrees to view in MyChart. Active MyChart status and patient understanding of how to access instructions and care plan via MyChart confirmed with patient.     The patient has been provided with contact information for the care management team and has been advised to call with any health related questions or concerns.   Ellsie Violette J. Marcel Gary RN, MSN [facility], [facility] RN Care Manager Direct Dial: [phone]  Fax: [phone] Website: [email or web address]

## 2023-06-26 NOTE — Progress Notes (Signed)
 Established Patient Office Visit   Subjective:  Patient ID: Dennis Moon, male    DOB: 04/17/1946  Age: 78 y.o. MRN: [id number]  Chief Complaint  Patient presents with   Medical Management of Chronic Issues    6 month follow up. Pt is fasting. Pt requesting Rx for Testosterone .     HPI Encounter Diagnoses  Name Primary?   Acquired hypothyroidism Yes   Prediabetes    Androgen deficiency    Screening for prostate cancer    Other chronic pain    Medication side effect    For follow-up of above.  Continues to practice dentistry.  Lost to follow-up for some months now.  Turns out he has not been seen by urologist.  Continue Cymbalta  for chronic aches and pains associated with arthritis.  Continues metformin  for prediabetes.  Continues follow-up with cardiology for hypertension and elevated cholesterol with history of coronary artery disease.   Review of Systems  Constitutional: Negative.   HENT: Negative.    Eyes:  Negative for blurred vision, discharge and redness.  Respiratory: Negative.    Cardiovascular: Negative.   Gastrointestinal:  Negative for abdominal pain.  Genitourinary: Negative.   Musculoskeletal: Negative.  Negative for myalgias.  Skin:  Negative for rash.  Neurological:  Negative for tingling, loss of consciousness and weakness.  Endo/Heme/Allergies:  Negative for polydipsia.     Current Outpatient Medications:    acyclovir  cream (ZOVIRAX ) 5 %, APPLY TOPICALLY EVERY 3 HOURS, Disp: 5 g, Rfl: 10   albuterol  (VENTOLIN  HFA) 108 (90 Base) MCG/ACT inhaler, INHALE ONE (1) PUFF BY MOUTH INTO LUNGS EVERY 6 HOURS AS NEEDED FOR WHEEZING AND/OR FOR SHORTNESS OF BREATH *REFILL REQUEST*, Disp: 8.5 g, Rfl: 10   allopurinol  (ZYLOPRIM ) 300 MG tablet, Take 1 tablet (300 mg total) by mouth every evening., Disp: 30 tablet, Rfl: 2   amLODipine  (NORVASC ) 5 MG tablet, Take 1 tablet (5 mg total) by mouth daily., Disp: 90 tablet, Rfl: 3   Ascorbic Acid (VITAMIN C) 1000 MG tablet,  Take 1,000 mg by mouth every morning., Disp: , Rfl:    aspirin -acetaminophen -caffeine (EXCEDRIN EXTRA STRENGTH) 250-250-65 MG tablet, Take 2 tablets by mouth 3 (three) times daily as needed for headache or migraine (pain)., Disp: , Rfl:    atorvastatin  (LIPITOR ) 80 MG tablet, TAKE 1 TABLET(80 MG) BY MOUTH EVERY EVENING, Disp: 15 tablet, Rfl: 0   Calcium  Carb-Cholecalciferol (CALCIUM  600 + D PO), Take 1 tablet by mouth every morning., Disp: , Rfl:    cholecalciferol (VITAMIN D) 25 MCG (1000 UNIT) tablet, Take 1,000 Units by mouth every morning., Disp: , Rfl:    clobetasol  cream (TEMOVATE ) 0.05 %, Apply to affected area twice daily as needed for up to 2 weeks, Disp: 30 g, Rfl: 0   CRANBERRY EXTRACT PO, Take 4,200 mg by mouth 2 (two) times daily., Disp: , Rfl:    EPINEPHRINE  0.3 mg/0.3 mL IJ SOAJ injection, INJECT 0.3 MG INTO THE MUSCLE SEE ADMIN INSTRUCTIONS FOR ALLERGIC REACTION., Disp: 2 each, Rfl: 10   esomeprazole  (NEXIUM ) 20 MG capsule, Take 1 capsule (20 mg total) by mouth 2 (two) times daily before a meal., Disp: 90 capsule, Rfl: 3   fluorometholone (FML) 0.1 % ophthalmic suspension, Place 1 drop into both eyes daily as needed (dry eyes/irritation)., Disp: , Rfl:    levocetirizine (XYZAL) 5 MG tablet, Take 5 mg by mouth daily as needed (seasonal allergies)., Disp: , Rfl:    levothyroxine  (SYNTHROID ) 112 MCG tablet, TAKE 1 TABLET BY  MOUTH EVERY EVENING, Disp: 30 tablet, Rfl: 10   metFORMIN  (GLUCOPHAGE -XR) 500 MG 24 hr tablet, Take 1 tablet (500 mg total) by mouth every evening., Disp: 90 tablet, Rfl: 3   Multiple Vitamin (MULTIVITAMIN WITH MINERALS) TABS tablet, Take 1 tablet by mouth every morning. One a Day for Men over 50, Disp: , Rfl:    nitroGLYCERIN  (NITROSTAT ) 0.4 MG SL tablet, DISSOLVE 1 TABLET UNDER THE TONGUE EVERY 5 MINUTES AS NEEDED FOR CHEST PAIN. DO NOT EXCEED A TOTAL OF 3 DOSES IN 15 MINUTES., Disp: 25 tablet, Rfl: 11   Polyethyl Glycol-Propyl Glycol (SYSTANE) 0.4-0.3 % SOLN, Place  1 drop into both eyes 2 (two) times daily as needed (dry eyes)., Disp: , Rfl:    predniSONE  (DELTASONE ) 50 MG tablet, Take 1 tablet daily for 5 days., Disp: 5 tablet, Rfl: 0   Probiotic Product (PROBIOTIC PO), Take 1 capsule by mouth every morning., Disp: , Rfl:    propranolol  (INDERAL ) 60 MG tablet, TAKE ONE TABLET BY MOUTH TWICE DAILY, Disp: 180 tablet, Rfl: 4   Ubrogepant  (UBRELVY ) 50 MG TABS, Take one at first sign of migraine. May repeat once in 2 hours. (Patient taking differently: Take 50 mg by mouth See admin instructions. Take one tablet (50 mg) by mouth at first sign of migraine; may repeat once in 2 hours if still needed), Disp: 10 tablet, Rfl: 2   zinc gluconate 50 MG tablet, Take 50 mg by mouth every morning., Disp: , Rfl:    DULoxetine  (CYMBALTA ) 60 MG capsule, Take 1 capsule (60 mg total) by mouth every evening., Disp: 90 capsule, Rfl: 2   Objective:     BP 122/80   Pulse 75   Temp 98 F (36.7 C)   Ht 6' (1.829 m)   Wt 224 lb 3.2 oz (101.7 kg)   SpO2 99%   BMI 30.41 kg/m    Physical Exam   No results found for any visits on 06/26/23.    The ASCVD Risk score (Arnett DK, et al., 2019) failed to calculate for the following reasons:   Risk score cannot be calculated because patient has a medical history suggesting prior/existing ASCVD    Assessment & Plan:   Acquired hypothyroidism -     TSH  Prediabetes -     Basic metabolic panel -     Urinalysis, Routine w reflex microscopic  Androgen deficiency -     Testosterone  Total,Free,Bio, Males  Screening for prostate cancer -     PSA  Other chronic pain -     DULoxetine  HCl; Take 1 capsule (60 mg total) by mouth every evening.  Dispense: 90 capsule; Refill: 2  Medication side effect -     CBC    Return in about 3 months (around 09/23/2023).  Checking CBC and PSA with testosterone  levels for androgen deficiency.  Discussed relative risk of using propranolol  with a tendency for RAD.  Turns out he is rarely  using the albuterol  inhaler.  Canister last year or better.  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  Will further researched testosterone  pump.  Could consider using injections.  Elsie Sim Lent, MD

## 2023-06-26 NOTE — Patient Outreach (Signed)
  Care Coordination   In Person Provider Office Visit Note   06/26/2023 Name: Dennis Moon MRN: [id number] DOB: 02-07-1946  Dennis Moon is a 78 y.o. year old male who sees Berneta Elsie Sayre, MD for primary care. I engaged with Dennis Moon in the providers office today.  What matters to the patients health and wellness today?  none    Goals Addressed             This Visit's Progress    COMPLETED: Care coordination activities-No follow up required       Care Coordination Interventions: Discussed services and support. Advised to discuss with primary care physician if services needed in the future.         SDOH assessments and interventions completed:  Yes  SDOH Interventions Today    Flowsheet Row Most Recent Value  SDOH Interventions   Health Literacy Interventions Intervention Not Indicated        Care Coordination Interventions:  Yes, provided   Follow up plan: No further intervention required.   Encounter Outcome:  Patient Visit Completed   Kamiryn Bezanson J. Cybele Maule RN, MSN [facility], [facility] RN Care Manager Direct Dial: [phone]  Fax: [phone] Website: [email or web address]

## 2023-06-27 LAB — TESTOSTERONE TOTAL,FREE,BIO, MALES
Albumin: 4 g/dL (ref 3.6–5.1)
Sex Hormone Binding: 26 nmol/L (ref 22–77)
Testosterone, Bioavailable: 1289.4 ng/dL — ABNORMAL HIGH (ref 15.0–150.0)
Testosterone, Free: 701.1 pg/mL — ABNORMAL HIGH (ref 6.0–73.0)
Testosterone: 2352 ng/dL — ABNORMAL HIGH (ref 250–827)

## 2023-06-28 ENCOUNTER — Encounter: Payer: Self-pay | Admitting: Family Medicine

## 2023-06-28 DIAGNOSIS — E291 Testicular hypofunction: Secondary | ICD-10-CM

## 2023-07-06 ENCOUNTER — Encounter: Payer: Self-pay | Admitting: Family Medicine

## 2023-07-06 ENCOUNTER — Other Ambulatory Visit: Payer: Self-pay | Admitting: Internal Medicine

## 2023-07-06 DIAGNOSIS — R7303 Prediabetes: Secondary | ICD-10-CM

## 2023-07-10 NOTE — Progress Notes (Signed)
 Office Visit Note  Patient: Dennis Moon             Date of Birth: March 09, 1946           MRN: [id number]             PCP: Mliss Sax, MD Referring: Mliss Sax,* Visit Date: 07/24/2023 Occupation: @GUAROCC @  Subjective:  No chief complaint on file.   History of Present Illness: Dennis Moon is a 78 y.o. male with Sjogren's, cutaneous lupus, osteoarthritis and degenerative disc disease.  He states he continues to have dry mouth and dry eye symptoms.  He has been using eyedrops which has been helpful.  He has been using over-the-counter products for dry mouth.  He denies any shortness of breath or lymphadenopathy.  He states he still working Monday to Thursday.  He is planning to retire over the next 2 years.  He denies having a flare of cutaneous lupus.  He has been using sunscreen on a regular basis.  He also sees dermatologist once a year.  He has some stiffness in his hands but no joint swelling.  He continues to have discomfort in his lower back due to underlying disc disease.  He has limited range of motion of his left shoulder joint.  He states he wants to have surgery on his left shoulder after he retires.    Activities of Daily Living:  Patient reports morning stiffness for less than 1 hour.   Patient Denies nocturnal pain.  Difficulty dressing/grooming: Denies Difficulty climbing stairs: Denies Difficulty getting out of chair: Denies Difficulty using hands for taps, buttons, cutlery, and/or writing: Denies  Review of Systems  Constitutional:  Negative for fatigue.  HENT:  Positive for mouth dryness. Negative for mouth sores.   Eyes:  Positive for dryness.  Respiratory:  Negative for shortness of breath.   Cardiovascular:  Negative for chest pain and palpitations.  Gastrointestinal:  Negative for blood in stool, constipation and diarrhea.  Endocrine: Negative for increased urination.  Genitourinary:  Negative for involuntary urination.   Musculoskeletal:  Positive for joint pain, gait problem, joint pain, myalgias, morning stiffness and myalgias. Negative for joint swelling, muscle weakness and muscle tenderness.  Skin:  Positive for color change. Negative for rash and sensitivity to sunlight.  Allergic/Immunologic: Negative for susceptible to infections.  Neurological:  Positive for numbness and headaches. Negative for dizziness.  Hematological:  Negative for swollen glands.  Psychiatric/Behavioral:  Negative for depressed mood and sleep disturbance. The patient is not nervous/anxious.     PMFS History:  Patient Active Problem List   Diagnosis Date Noted   Acute pain of left shoulder 07/08/2022   Cough 07/07/2022   Need for influenza vaccination 02/07/2022   Herpes labialis 02/07/2022   Mild reactive airways disease 07/26/2021   Hospital discharge follow-up 04/26/2021   Prediabetes 04/26/2021   Unstable angina (HCC) 03/29/2021   Chest pain 03/29/2021   Gastroesophageal reflux disease 09/07/2020   Allergy to honey bee venom 09/07/2020   History of gout 12/29/2019   Elevated cholesterol 12/29/2019   Screening for prostate cancer 12/29/2019   Nonintractable headache 06/24/2019   Androgen deficiency 11/17/2017   Hypothyroidism 11/17/2017   Cyst of tonsil 06/11/2017   Obesity (BMI 30.0-34.9) 03/12/2017   CAD S/P percutaneous coronary angioplasty 12/13/2016   NSTEMI (non-ST elevated myocardial infarction) (HCC) 12/11/2016   Gout of multiple sites 08/26/2016   Chronic renal impairment 08/26/2016   Chronic kidney disease 08/26/2016   Sjogren's syndrome (  HCC) 04/23/2016   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   Idiopathic chronic gout, unspecified site, without tophus (tophi) 04/23/2016   Primary osteoarthritis of both hands 04/23/2016   DDD (degenerative disc disease), lumbar 04/23/2016   Kidney disease 04/23/2016    Past Medical History:  Diagnosis Date   CAD S/P percutaneous coronary angioplasty 12/12/2016   a.  11/2016: NSTEMI with DES to LAD and DES to LCx; b. MV low risk 2021; c. NSTEMI 11/22, LHC patent stents to LAD/LCx, 40% mLAD, 20% mRCA, stable, med mgmt. Echo 11/22 EF 60-65%, GIDD, mild LAE.   CKD (chronic kidney disease), stage III (HCC)    GERD (gastroesophageal reflux disease)    Gout    History of Clostridioides difficile infection 2024   Hyperlipidemia LDL goal <70    Hypertension    Hypothyroidism    Migraine    Open fracture dislocation of interphalangeal joint of left thumb 10/14/2022   Osteoarthritis    Rotator cuff tear    left   Sjogren's disease (HCC)    Systemic lupus erythematosus (HCC)     Family History  Problem Relation Age of Onset   Diabetes Mother    Hypertension Mother    Hypertension Father    Healthy Son    Past Surgical History:  Procedure Laterality Date   CATARACT EXTRACTION, BILATERAL Bilateral 2017   COLONOSCOPY  2021   CORONARY STENT INTERVENTION N/A 12/12/2016   Procedure: Coronary Stent Intervention;  Surgeon: Swaziland, Peter M, MD;  Location: [facility] INVASIVE CV LAB;  Service: Cardiovascular;  Laterality: N/A;   HERNIA REPAIR     LEFT HEART CATH AND CORONARY ANGIOGRAPHY N/A 12/12/2016   Procedure: Left Heart Cath and Coronary Angiography;  Surgeon: Swaziland, Peter M, MD;  Location: [facility] INVASIVE CV LAB;  Service: Cardiovascular;  Laterality: N/A;   LEFT HEART CATH AND CORONARY ANGIOGRAPHY N/A 03/29/2021   Procedure: LEFT HEART CATH AND CORONARY ANGIOGRAPHY;  Surgeon: Kathleene Hazel, MD;  Location: [facility] INVASIVE CV LAB;  Service: Cardiovascular;  Laterality: N/A;   THYROID SURGERY  1997   Social History   Social History Narrative   Not on file   Immunization History  Administered Date(s) Administered   Influenza Whole 02/25/2021   Influenza, High Dose Seasonal PF 02/07/2022   Influenza-Unspecified 02/24/2018, 02/04/2019, 02/16/2020   PFIZER(Purple Top)SARS-COV-2 Vaccination 06/17/2019, 07/08/2019, 02/16/2020   Pneumococcal Conjugate-13  10/21/2013   Pneumococcal Polysaccharide-23 08/02/2008, 03/14/2016   Tdap 07/18/2019   Zoster Recombinant(Shingrix) 11/12/2017   Zoster, Live 08/28/2006     Objective: Vital Signs: BP 121/69 (BP Location: Left Arm, Patient Position: Sitting, Cuff Size: Normal)   Pulse (!) 58   Resp 15   Ht 6' (1.829 m)   Wt 222 lb (100.7 kg)   BMI 30.11 kg/m    Physical Exam Vitals and nursing note reviewed.  Constitutional:      Appearance: He is well-developed.  HENT:     Head: Normocephalic and atraumatic.  Eyes:     Conjunctiva/sclera: Conjunctivae normal.     Pupils: Pupils are equal, round, and reactive to light.  Cardiovascular:     Rate and Rhythm: Normal rate and regular rhythm.     Heart sounds: Normal heart sounds.  Pulmonary:     Effort: Pulmonary effort is normal.     Breath sounds: Normal breath sounds.  Abdominal:     General: Bowel sounds are normal.     Palpations: Abdomen is soft.  Musculoskeletal:     Cervical back: Normal range of motion  and neck supple.  Skin:    General: Skin is warm and dry.     Capillary Refill: Capillary refill takes less than 2 seconds.  Neurological:     Mental Status: He is alert and oriented to person, place, and time.  Psychiatric:        Behavior: Behavior normal.      Musculoskeletal Exam: He had limited lateral rotation of the cervical spine.  Thoracic kyphosis was noted.  There was no tenderness over thoracic or lumbar region.  Left shoulder joint abduction and forward flexion was limited to about 30 degrees.  Internal rotation was also limited.  Right shoulder joint was in full range of motion.  Elbow joints, wrist joints, MCPs PIPs and DIPs were in good range of motion.  He had bilateral PIP and DIP thickening with no synovitis.  Hip joints and knee joints in good range of motion.  There was no tenderness over ankles or MTPs.  CDAI Exam: CDAI Score: -- Patient Global: --; Provider Global: -- Swollen: --; Tender: -- Joint Exam  07/24/2023   No joint exam has been documented for this visit   There is currently no information documented on the homunculus. Go to the Rheumatology activity and complete the homunculus joint exam.  Investigation: No additional findings.  Imaging: No results found.  Recent Labs: Lab Results  Component Value Date   WBC 6.4 06/26/2023   HGB 16.9 06/26/2023   PLT 197.0 06/26/2023   NA 145 06/26/2023   K 4.5 06/26/2023   CL 99 06/26/2023   CO2 36 (H) 06/26/2023   GLUCOSE 90 06/26/2023   BUN 22 06/26/2023   CREATININE 1.35 06/26/2023   BILITOT 0.3 03/23/2023   ALKPHOS 114 03/23/2023   AST 35 03/23/2023   ALT 33 03/23/2023   PROT 7.5 03/23/2023   ALBUMIN 4.2 03/23/2023   CALCIUM 8.8 06/26/2023   GFRAA 59 (L) 07/01/2019    Speciality Comments: PLQ eye exam normal on 05/07/18  @ [facility].  Procedures:  No procedures performed Allergies: Bee venom   Assessment / Plan:     Visit Diagnoses: Sjogren's syndrome with other organ involvement (HCC) - Positive ANA, positive Ro, positive La: -He continues to have dry mouth and dry eye symptoms.  He states symptoms are manageable with over-the-counter products.  He denies any shortness of breath or lymphadenopathy.  Increased risk of ILD and lymphoma with Sjogren's was discussed.  I will obtain following labs today.  Plan: Protein / creatinine ratio, urine, Anti-DNA antibody, double-stranded, C3 and C4, Sedimentation rate, ANA, Sjogrens syndrome-A extractable nuclear antibody, COMPLETE METABOLIC PANEL WITH GFR  Subacute cutaneous lupus erythematosus-he uses sunscreen and also sees a dermatologist on an annual basis.  He denies having a flare of cutaneous lupus.  High risk medication use - d/c PLQ due to abnormal eye exam 11/2018.  Not currently taking immunosuppressive agents.  Idiopathic chronic gout of multiple sites without tophus -he denies having a gout flare.  Allopurinol 300 mg 1 tablet by mouth daily. uric acid: 4.2  on 01/16/2023 -will check uric acid level today.  Plan: Uric acid  Chronic left shoulder pain-he has very limited range of motion of the left shoulder due to rotator cuff tear.  He has been followed by orthopedics.  He plans to have surgery after he retires.  Primary osteoarthritis of both hands-bilateral PIP and DIP thickening with no synovitis.  Joint protection muscle strengthening was discussed.  Spondylosis of lumbar spine-he has disc disease of  the lumbar spine.  He denies any radiculopathy.  He states he has chronic discomfort in his lower back.  He uses a heating pad at nighttime and tries to do stretching exercises.  Raynaud's disease without gangrene-he has intermittent Raynauds symptoms.  Had good capillary refill without any sclerodactyly.  History of Clostridioides difficile infection - Stool sample positive on 11/21/2022.  Treated with vancomycin. Resolved  History of thumb fracture - Left-Fall end of May 2024.  CAD S/P percutaneous coronary angioplasty  History of chronic kidney disease - He is followed by nephrologist.  History of non-ST elevation myocardial infarction (NSTEMI) - He underwent a left heart catheterization on 03/29/2021.  Osteoporosis screening -he is 78 years old.  He has never had a DEXA scan.  Use of calcium rich diet vitamin D was advised.  Need for regular exercise was discussed.  Plan: DG Bone Density  Orders: Orders Placed This Encounter  Procedures   DG Bone Density   Protein / creatinine ratio, urine   Anti-DNA antibody, double-stranded   C3 and C4   Sedimentation rate   ANA   Sjogrens syndrome-A extractable nuclear antibody   Uric acid   COMPLETE METABOLIC PANEL WITH GFR   No orders of the defined types were placed in this encounter.   Face-to-face time spent with patient was 30 minutes. Greater than 50% of time was spent in counseling and coordination of care.  Follow-Up Instructions: Return in about 6 months (around 01/24/2024) for  Sjogren's, CLE, Osteoarthritis.   Pollyann Savoy, MD  Note - This record has been created using Animal nutritionist.  Chart creation errors have been sought, but may not always  have been located. Such creation errors do not reflect on  the standard of medical care.

## 2023-07-14 ENCOUNTER — Other Ambulatory Visit (INDEPENDENT_AMBULATORY_CARE_PROVIDER_SITE_OTHER): Payer: Medicare Other

## 2023-07-14 DIAGNOSIS — R7303 Prediabetes: Secondary | ICD-10-CM | POA: Diagnosis not present

## 2023-07-14 DIAGNOSIS — E291 Testicular hypofunction: Secondary | ICD-10-CM

## 2023-07-14 LAB — HEMOGLOBIN A1C: Hgb A1c MFr Bld: 6.2 % (ref 4.6–6.5)

## 2023-07-15 LAB — TESTOSTERONE TOTAL,FREE,BIO, MALES
Albumin: 3.8 g/dL (ref 3.6–5.1)
Sex Hormone Binding: 29 nmol/L (ref 22–77)
Testosterone: 21 ng/dL — ABNORMAL LOW (ref 250–827)

## 2023-07-16 ENCOUNTER — Encounter: Payer: Self-pay | Admitting: Family Medicine

## 2023-07-24 ENCOUNTER — Ambulatory Visit: Payer: Medicare Other | Attending: Rheumatology | Admitting: Rheumatology

## 2023-07-24 ENCOUNTER — Encounter: Payer: Self-pay | Admitting: Rheumatology

## 2023-07-24 VITALS — BP 121/69 | HR 58 | Resp 15 | Ht 72.0 in | Wt 222.0 lb

## 2023-07-24 DIAGNOSIS — I252 Old myocardial infarction: Secondary | ICD-10-CM

## 2023-07-24 DIAGNOSIS — M19042 Primary osteoarthritis, left hand: Secondary | ICD-10-CM

## 2023-07-24 DIAGNOSIS — L931 Subacute cutaneous lupus erythematosus: Secondary | ICD-10-CM

## 2023-07-24 DIAGNOSIS — M3509 Sicca syndrome with other organ involvement: Secondary | ICD-10-CM

## 2023-07-24 DIAGNOSIS — M19041 Primary osteoarthritis, right hand: Secondary | ICD-10-CM | POA: Diagnosis not present

## 2023-07-24 DIAGNOSIS — M47816 Spondylosis without myelopathy or radiculopathy, lumbar region: Secondary | ICD-10-CM

## 2023-07-24 DIAGNOSIS — Z79899 Other long term (current) drug therapy: Secondary | ICD-10-CM | POA: Diagnosis not present

## 2023-07-24 DIAGNOSIS — Z87448 Personal history of other diseases of urinary system: Secondary | ICD-10-CM | POA: Diagnosis not present

## 2023-07-24 DIAGNOSIS — Z9861 Coronary angioplasty status: Secondary | ICD-10-CM

## 2023-07-24 DIAGNOSIS — M1A09X0 Idiopathic chronic gout, multiple sites, without tophus (tophi): Secondary | ICD-10-CM

## 2023-07-24 DIAGNOSIS — I7300 Raynaud's syndrome without gangrene: Secondary | ICD-10-CM | POA: Diagnosis not present

## 2023-07-24 DIAGNOSIS — I2510 Atherosclerotic heart disease of native coronary artery without angina pectoris: Secondary | ICD-10-CM | POA: Diagnosis not present

## 2023-07-24 DIAGNOSIS — G8929 Other chronic pain: Secondary | ICD-10-CM

## 2023-07-24 DIAGNOSIS — Z8619 Personal history of other infectious and parasitic diseases: Secondary | ICD-10-CM | POA: Diagnosis not present

## 2023-07-24 DIAGNOSIS — Z13820 Encounter for screening for osteoporosis: Secondary | ICD-10-CM

## 2023-07-24 DIAGNOSIS — Z8781 Personal history of (healed) traumatic fracture: Secondary | ICD-10-CM | POA: Diagnosis not present

## 2023-07-24 DIAGNOSIS — M25512 Pain in left shoulder: Secondary | ICD-10-CM | POA: Diagnosis not present

## 2023-07-26 LAB — COMPLETE METABOLIC PANEL WITH GFR
AG Ratio: 1.3 (calc) (ref 1.0–2.5)
ALT: 37 U/L (ref 9–46)
AST: 30 U/L (ref 10–35)
Albumin: 4.2 g/dL (ref 3.6–5.1)
Alkaline phosphatase (APISO): 98 U/L (ref 35–144)
BUN: 24 mg/dL (ref 7–25)
CO2: 34 mmol/L — ABNORMAL HIGH (ref 20–32)
Calcium: 9.7 mg/dL (ref 8.6–10.3)
Chloride: 102 mmol/L (ref 98–110)
Creat: 1.28 mg/dL (ref 0.70–1.28)
Globulin: 3.2 g/dL (ref 1.9–3.7)
Glucose, Bld: 77 mg/dL (ref 65–99)
Potassium: 4.7 mmol/L (ref 3.5–5.3)
Sodium: 141 mmol/L (ref 135–146)
Total Bilirubin: 0.4 mg/dL (ref 0.2–1.2)
Total Protein: 7.4 g/dL (ref 6.1–8.1)
eGFR: 57 mL/min/{1.73_m2} — ABNORMAL LOW (ref >=60)

## 2023-07-26 LAB — C3 AND C4
C3 Complement: 150 mg/dL (ref 82–185)
C4 Complement: 23 mg/dL (ref 15–53)

## 2023-07-26 LAB — ANTI-NUCLEAR AB-TITER (ANA TITER)
ANA TITER: 1:1280 {titer} — ABNORMAL HIGH
ANA Titer 1: 1:40 {titer} — ABNORMAL HIGH

## 2023-07-26 LAB — ANA: Anti Nuclear Antibody (ANA): POSITIVE — AB

## 2023-07-26 LAB — PROTEIN / CREATININE RATIO, URINE
Creatinine, Urine: 203 mg/dL (ref 20–320)
Protein/Creat Ratio: 315 mg/g{creat} — ABNORMAL HIGH (ref 25–148)
Protein/Creatinine Ratio: 0.315 mg/mg{creat} — ABNORMAL HIGH (ref 0.025–0.148)
Total Protein, Urine: 64 mg/dL — ABNORMAL HIGH (ref 5–25)

## 2023-07-26 LAB — SJOGRENS SYNDROME-A EXTRACTABLE NUCLEAR ANTIBODY: SSA (Ro) (ENA) Antibody, IgG: >8 AI — AB

## 2023-07-26 LAB — ANTI-DNA ANTIBODY, DOUBLE-STRANDED: ds DNA Ab: 1 [IU]/mL

## 2023-07-26 LAB — SEDIMENTATION RATE: Sed Rate: 6 mm/h (ref 0–20)

## 2023-07-26 LAB — URIC ACID: Uric Acid, Serum: 4.3 mg/dL (ref 4.0–8.0)

## 2023-07-26 NOTE — Progress Notes (Signed)
 Uric acid is in the desirable  range.  Urine protein creatinine ratio is elevated, GFR is low at 57 and stable, ANA remains positive along with SSA antibody associated with Sjogren's, double-stranded DNA negative, complements normal and sed rate normal which do not indicate an autoimmune disease flare.  No change in treatment advised.  Please forward results to his nephrologist and PCP.

## 2023-07-28 ENCOUNTER — Other Ambulatory Visit: Payer: Self-pay | Admitting: Family Medicine

## 2023-07-28 DIAGNOSIS — K219 Gastro-esophageal reflux disease without esophagitis: Secondary | ICD-10-CM

## 2023-08-07 DIAGNOSIS — N1831 Chronic kidney disease, stage 3a: Secondary | ICD-10-CM | POA: Diagnosis not present

## 2023-08-07 DIAGNOSIS — E1122 Type 2 diabetes mellitus with diabetic chronic kidney disease: Secondary | ICD-10-CM | POA: Diagnosis not present

## 2023-08-07 DIAGNOSIS — I129 Hypertensive chronic kidney disease with stage 1 through stage 4 chronic kidney disease, or unspecified chronic kidney disease: Secondary | ICD-10-CM | POA: Diagnosis not present

## 2023-08-14 ENCOUNTER — Ambulatory Visit (HOSPITAL_BASED_OUTPATIENT_CLINIC_OR_DEPARTMENT_OTHER)
Admission: RE | Admit: 2023-08-14 | Discharge: 2023-08-14 | Disposition: A | Discharge status: Home / Self Care | Source: Ambulatory Visit | Attending: Rheumatology | Admitting: Rheumatology

## 2023-08-14 DIAGNOSIS — E119 Type 2 diabetes mellitus without complications: Secondary | ICD-10-CM | POA: Diagnosis not present

## 2023-08-14 DIAGNOSIS — Z13820 Encounter for screening for osteoporosis: Secondary | ICD-10-CM | POA: Insufficient documentation

## 2023-08-14 DIAGNOSIS — M8589 Other specified disorders of bone density and structure, multiple sites: Secondary | ICD-10-CM | POA: Diagnosis not present

## 2023-08-14 DIAGNOSIS — E039 Hypothyroidism, unspecified: Secondary | ICD-10-CM | POA: Insufficient documentation

## 2023-08-14 NOTE — Progress Notes (Signed)
 The BMD measured at Left Forearm Radius 33% is 0.814 g/cm2 with aT-score of -1.8 is considered osteopenic.  Recommend total calcium intake of 1200 mg p.o. daily between dietary and supplement and vitamin D.  Please advise 30 minutes of walking and resistive exercises.  Repeat DEXA scan in 2 years.

## 2023-08-20 ENCOUNTER — Telehealth: Payer: Self-pay

## 2023-08-20 NOTE — Telephone Encounter (Signed)
 Copied from CRM [id number]. Topic: Clinical - Medical Advice >> Aug 20, 2023 12:32 PM [name] wrote: Reason for CRM: Patient called in stating he is having 3 or more bowel movements a day, isnt having any other symptoms. No fever or anything. Stated he has something like this before and has Cdiff, would like to know what's he needs to do. If he needs to schedule an appointment to see Dr.Kremer or if he needs to bring a stool sample in, would like a callback on what he should do going forward   [phone]

## 2023-08-20 NOTE — Telephone Encounter (Signed)
 Called patient and scheduled an appt with Dr Janee Morn on 08/24/23 @ 1;40 pm. Dm/cma

## 2023-08-24 ENCOUNTER — Ambulatory Visit: Admitting: Family Medicine

## 2023-08-28 DIAGNOSIS — T372X5A Adverse effect of antimalarials and drugs acting on other blood protozoa, initial encounter: Secondary | ICD-10-CM | POA: Diagnosis not present

## 2023-08-28 DIAGNOSIS — D3132 Benign neoplasm of left choroid: Secondary | ICD-10-CM | POA: Diagnosis not present

## 2023-08-28 DIAGNOSIS — Z79899 Other long term (current) drug therapy: Secondary | ICD-10-CM | POA: Diagnosis not present

## 2023-08-28 DIAGNOSIS — M3501 Sicca syndrome with keratoconjunctivitis: Secondary | ICD-10-CM | POA: Diagnosis not present

## 2023-08-28 DIAGNOSIS — H532 Diplopia: Secondary | ICD-10-CM | POA: Diagnosis not present

## 2023-09-04 ENCOUNTER — Ambulatory Visit: Payer: Self-pay

## 2023-09-04 NOTE — Telephone Encounter (Addendum)
 Copied from CRM [id number]. Topic: Clinical - Red Word Triage >> Sep 04, 2023  1:57 PM Orien Bird wrote: [facility] that prompted transfer to Nurse Triage: Patient is having some diarrhea and he stated this has been the 2nd week it's been going on.   Chief Complaint: Diarrhea Symptoms: Loose stools, generalized malaise Frequency: for about a month Pertinent Negatives: Patient denies blood in his stools, fever, abdominal pain, vomiting,  Disposition: [] ED /[] Urgent Care (no appt availability in office) / [x] Appointment(In office/virtual)/ []  [facility]/ [] Home Care/ [] Refused Recommended Disposition /[] [facility]/ []  Follow-up with PCP Additional Notes: Patient states that a month ago he had some loose stools. He initially made an appointment, his diarrhea went away so he cancelled his appointment, but then his loose stools came back about two weeks ago where he is having bowel movements 2-3 times a day. Patient states that his stools are softer. Patient states that he said C-diff last year and had no problems since then. The only change in medications is he started taking Losartan but his diarrhea started prior to this. Patient is adequately hydrating. Patient was advised by his Nephrologist that he would feel a little sluggish after starting the Losartan. Appointment made for Monday 09/07/2023 at 10:40 am with Rheba Cedar at patient's PCP Office. Patient is also advised that if he worsens at any point to go to the Emergency Room. Patient verbalized understanding.    Reason for Disposition  [1] Mild diarrhea (e.g., 1-3 or more stools than normal in past 24 hours) without known cause AND [2] present >  7 days  Answer Assessment - Initial Assessment Questions 1. DIARRHEA SEVERITY: "How bad is the diarrhea?" "How many more stools have you had in the past 24 hours than normal?"    - NO DIARRHEA (SCALE 0)   - MILD (SCALE 1-3): Few loose or mushy BMs; increase of 1-3  stools over normal daily number of stools; mild increase in ostomy output.   -  MODERATE (SCALE 4-7): Increase of 4-6 stools daily over normal; moderate increase in ostomy output.   -  SEVERE (SCALE 8-10; OR "WORST POSSIBLE"): Increase of 7 or more stools daily over normal; moderate increase in ostomy output; incontinence.     Mild  3 times 2. ONSET: "When did the diarrhea begin?"      About a month ago 3. BM CONSISTENCY: "How loose or watery is the diarrhea?"      Loose 4. VOMITING: "Are you also vomiting?" If Yes, ask: "How many times in the past 24 hours?"      No 5. ABDOMEN PAIN: "Are you having any abdomen pain?" If Yes, ask: "What does it feel like?" (e.g., crampy, dull, intermittent, constant)      No 6. ABDOMEN PAIN SEVERITY: If present, ask: "How bad is the pain?"  (e.g., Scale 1-10; mild, moderate, or severe)   - MILD (1-3): doesn't interfere with normal activities, abdomen soft and not tender to touch    - MODERATE (4-7): interferes with normal activities or awakens from sleep, abdomen tender to touch    - SEVERE (8-10): excruciating pain, doubled over, unable to do any normal activities       No 7. ORAL INTAKE: If vomiting, "Have you been able to drink liquids?" "How much liquids have you had in the past 24 hours?"     Yes 8. HYDRATION: "Any signs of dehydration?" (e.g., dry mouth [not just dry lips], too weak to stand, dizziness,  new weight loss) "When did you last urinate?"     No 9. EXPOSURE: "Have you traveled to a foreign country recently?" "Have you been exposed to anyone with diarrhea?" "Could you have eaten any food that was spoiled?"     No 10. ANTIBIOTIC USE: "Are you taking antibiotics now or have you taken antibiotics in the past 2 months?"       No 11. OTHER SYMPTOMS: "Do you have any other symptoms?" (e.g., fever, blood in stool)       Patient has felt a little "sluggish" but he states this may be from starting a new medication---Losartan  Protocols used:  [facility]

## 2023-09-07 ENCOUNTER — Encounter: Payer: Self-pay | Admitting: Nurse Practitioner

## 2023-09-07 ENCOUNTER — Ambulatory Visit (INDEPENDENT_AMBULATORY_CARE_PROVIDER_SITE_OTHER): Admitting: Nurse Practitioner

## 2023-09-07 VITALS — BP 124/80 | HR 66 | Temp 97.0°F | Ht 72.0 in | Wt 225.0 lb

## 2023-09-07 DIAGNOSIS — R197 Diarrhea, unspecified: Secondary | ICD-10-CM | POA: Insufficient documentation

## 2023-09-07 DIAGNOSIS — N1831 Chronic kidney disease, stage 3a: Secondary | ICD-10-CM | POA: Diagnosis not present

## 2023-09-07 NOTE — Patient Instructions (Signed)
 It was great to see you!  I have ordered a stool sample test, please bring this back Monday-Friday 8am-5pm.   Keep a journal to see if any foods make your symptoms worse   Keep taking the fiber and probiotic   Let's follow-up with Dr. Tilmon Font in 4 weeks  Take care,  Rheba Cedar, NP

## 2023-09-07 NOTE — Assessment & Plan Note (Signed)
 He experiences soft, sticky stools three times daily for two weeks. This had happened about 5 weeks ago and then went back to normal. Then symptoms returned 2 weeks ago. Last colonoscopy was 2 years ago and showed diverticulosis. Metformin  side effects are a possible cause. He does have a history of c-diff last summer. Will check stool for lactoferrin and GI panel. Continue fiber supplement and probiotic. Keep food journal to see if any foods triggering symptoms. Follow-up with PCP in 4 weeks.

## 2023-09-07 NOTE — Progress Notes (Signed)
 Acute Office Visit  Subjective:     Patient ID: Dennis Moon, male    DOB: [birth year], 78 y.o.   MRN: [id number]  Chief Complaint  Patient presents with   Loose Stools    For about 1.5 months with loose stools, questions if from starting Losartan    HPI Discussed the use of AI scribe software for clinical note transcription with the patient, who gave verbal consent to proceed.  History of Present Illness   The patient, with a past medical history of C. diff infection, diverticulosis, hypertension, and pre-diabetes, presents with a change in bowel habits over the past month. The patient reports an increase in frequency to two to three times a day, with stools described as softer, stickier, and messier than usual. The patient denies any associated pain, fever, nausea, vomiting, or blood in the stool. The patient also reports a decrease in appetite, which he attributes to the metformin  he has been taking for pre-diabetes. The patient's bowel habits had been regular, with one bowel movement a day, after starting a regimen of fiber and probiotics following a C. diff infection last summer. The patient denies any recent changes in diet or new medications, with the exception of losartan and metformin , which were started several months ago.      ROS See pertinent positives and negatives per HPI.     Objective:    BP 124/80 (BP Location: Left Arm, Patient Position: Sitting, Cuff Size: Normal)   Pulse 66   Temp (!) 97 F (36.1 C)   Ht 6' (1.829 m)   Wt 225 lb (102.1 kg)   SpO2 98%   BMI 30.52 kg/m  BP Readings from Last 3 Encounters:  09/07/23 124/80  07/24/23 121/69  06/26/23 122/80   Wt Readings from Last 3 Encounters:  09/07/23 225 lb (102.1 kg)  07/24/23 222 lb (100.7 kg)  06/26/23 224 lb 3.2 oz (101.7 kg)      Physical Exam Vitals and nursing note reviewed.  Constitutional:      Appearance: Normal appearance.  HENT:     Head: Normocephalic.  Eyes:      Conjunctiva/sclera: Conjunctivae normal.  Cardiovascular:     Rate and Rhythm: Normal rate and regular rhythm.     Pulses: Normal pulses.     Heart sounds: Normal heart sounds.  Pulmonary:     Effort: Pulmonary effort is normal.     Breath sounds: Normal breath sounds.  Abdominal:     General: Bowel sounds are normal. There is no distension.     Palpations: There is no mass.     Tenderness: There is abdominal tenderness (slight in LLQ, RLQ). There is no guarding.  Musculoskeletal:     Cervical back: Normal range of motion.  Skin:    General: Skin is warm.  Neurological:     General: No focal deficit present.     Mental Status: He is alert and oriented to person, place, and time.  Psychiatric:        Mood and Affect: Mood normal.        Behavior: Behavior normal.        Thought Content: Thought content normal.        Judgment: Judgment normal.      Assessment & Plan:   Problem List Items Addressed This Visit       Other   Diarrhea - Primary   He experiences soft, sticky stools three times daily for two weeks. This had happened  about 5 weeks ago and then went back to normal. Then symptoms returned 2 weeks ago. Last colonoscopy was 2 years ago and showed diverticulosis. Metformin  side effects are a possible cause. He does have a history of c-diff last summer. Will check stool for lactoferrin and GI panel. Continue fiber supplement and probiotic. Keep food journal to see if any foods triggering symptoms. Follow-up with PCP in 4 weeks.       Relevant Orders   GI Profile, Stool, PCR   Fecal lactoferrin, quant    No orders of the defined types were placed in this encounter.   Return in about 4 weeks (around 10/05/2023) for with PCP in 4 weeks.  Odette Benjamin, NP

## 2023-09-08 DIAGNOSIS — R197 Diarrhea, unspecified: Secondary | ICD-10-CM | POA: Diagnosis not present

## 2023-09-09 ENCOUNTER — Other Ambulatory Visit: Payer: Self-pay | Admitting: Family Medicine

## 2023-09-09 DIAGNOSIS — B001 Herpesviral vesicular dermatitis: Secondary | ICD-10-CM

## 2023-09-09 LAB — FECAL LACTOFERRIN, QUANT
Fecal Lactoferrin: NEGATIVE
MICRO NUMBER:: 16359461
SPECIMEN QUALITY:: ADEQUATE

## 2023-09-09 NOTE — Telephone Encounter (Signed)
 Copied from CRM [id number]. Topic: Clinical - Medication Refill >> Sep 09, 2023 12:02 PM [place] J wrote: Most Recent Primary Care Visit:  Provider: [facility]  Department: [facility]  Visit Type: LAB VISIT  Date: 07/14/2023  Medication: acyclovir  cream (ZOVIRAX ) 5 %  Has the patient contacted their pharmacy? No (Agent: If no, request that the patient contact the pharmacy for the refill. If patient does not wish to contact the pharmacy document the reason why and proceed with request.) (Agent: If yes, when and what did the pharmacy advise?)  Is this the correct pharmacy for this prescription? Yes If no, delete pharmacy and type the correct one.  This is the patient's preferred pharmacy:   [id number] [place] [facility] [place], [place] - [address] [place]. AT [facility] [address] [place]. [place] [address] Phone: [phone] Fax: [phone]  Has the prescription been filled recently? No  Is the patient out of the medication? No  Has the patient been seen for an appointment in the last year OR does the patient have an upcoming appointment? Yes  Can we respond through MyChart? Yes  Agent: Please be advised that Rx refills may take up to 3 business days. We ask that you follow-up with your pharmacy.

## 2023-09-10 LAB — GI PROFILE, STOOL, PCR

## 2023-09-11 ENCOUNTER — Encounter: Payer: Self-pay | Admitting: Nurse Practitioner

## 2023-09-16 ENCOUNTER — Telehealth: Payer: Self-pay | Admitting: Family Medicine

## 2023-09-16 DIAGNOSIS — B001 Herpesviral vesicular dermatitis: Secondary | ICD-10-CM

## 2023-09-16 NOTE — Telephone Encounter (Signed)
 Copied from CRM [id number]. Topic: Clinical - Prescription Issue >> Sep 16, 2023 12:30 PM Magdalene School wrote: Reason for CRM: Patient calling regarding acyclovir  cream (ZOVIRAX ) 5 % and he stated that his insurance denied it because it says "cream" instead of ointment. He would like the prescription to be resent to the pharmacy specifying "ointment".  Preferred Pharmacy: [facility] Address: [address] [place], [place], [place] [address] Phone: [phone]

## 2023-09-16 NOTE — Telephone Encounter (Signed)
 Please review request, let me know if I can do anything to help with this.

## 2023-09-17 ENCOUNTER — Other Ambulatory Visit: Payer: Self-pay

## 2023-09-17 DIAGNOSIS — B001 Herpesviral vesicular dermatitis: Secondary | ICD-10-CM

## 2023-09-17 MED ORDER — ACYCLOVIR 5 % EX OINT: 1.0000 | TOPICAL_OINTMENT | CUTANEOUS | 5 refills | Status: DC

## 2023-09-17 NOTE — Addendum Note (Signed)
 Addended by: Christianna Cowman A on: 09/17/2023 11:12 AM   Modules accepted: Orders

## 2023-10-01 ENCOUNTER — Other Ambulatory Visit: Payer: Self-pay | Admitting: Family Medicine

## 2023-10-01 DIAGNOSIS — E291 Testicular hypofunction: Secondary | ICD-10-CM

## 2023-10-02 ENCOUNTER — Ambulatory Visit: Admitting: Podiatry

## 2023-10-02 ENCOUNTER — Encounter: Payer: Self-pay | Admitting: Podiatry

## 2023-10-02 DIAGNOSIS — M79674 Pain in right toe(s): Secondary | ICD-10-CM

## 2023-10-02 DIAGNOSIS — B351 Tinea unguium: Secondary | ICD-10-CM

## 2023-10-02 DIAGNOSIS — N189 Chronic kidney disease, unspecified: Secondary | ICD-10-CM | POA: Diagnosis not present

## 2023-10-02 DIAGNOSIS — M79675 Pain in left toe(s): Secondary | ICD-10-CM

## 2023-10-02 NOTE — Progress Notes (Signed)
 This patient presents to the office with chief complaint of long thick painful nails.  Patient says the nails are painful walking and wearing shoes.  This patient is unable to self treat.  This patient is unable to trim his  nails since he is unable to reach his nails.  he presents to the office for preventative foot care services.  General Appearance  Alert, conversant and in no acute stress.  Vascular  Dorsalis pedis and posterior tibial  pulses are palpable  bilaterally.  Capillary return is within normal limits  bilaterally. Temperature is within normal limits  bilaterally.  Neurologic  Senn-Weinstein monofilament wire test within normal limits  bilaterally. Muscle power within normal limits bilaterally.  Nails Thick disfigured discolored nails with subungual debris  from hallux to fifth toes bilaterally. No evidence of bacterial infection or drainage bilaterally.  Orthopedic  No limitations of motion  feet .  No crepitus or effusions noted.  No bony pathology or digital deformities noted.  Skin  normotropic skin with no porokeratosis noted bilaterally.  No signs of infections or ulcers noted.     Onychomycosis  Nails  B/L.  Pain in right toes  Pain in left toes  Debridement of nails both feet followed trimming the nails with dremel tool.    RTC 3 months.   Helane Gunther DPM

## 2023-10-20 ENCOUNTER — Other Ambulatory Visit (INDEPENDENT_AMBULATORY_CARE_PROVIDER_SITE_OTHER)

## 2023-10-20 DIAGNOSIS — E291 Testicular hypofunction: Secondary | ICD-10-CM

## 2023-10-21 ENCOUNTER — Encounter: Payer: Self-pay | Admitting: Family Medicine

## 2023-10-21 LAB — TESTOSTERONE TOTAL,FREE,BIO, MALES
Albumin: 4.3 g/dL (ref 3.6–5.1)
Sex Hormone Binding: >360 nmol/L — ABNORMAL HIGH (ref 22–77)
Testosterone: 74 ng/dL — ABNORMAL LOW (ref 250–827)

## 2023-10-22 ENCOUNTER — Ambulatory Visit: Payer: Self-pay | Admitting: Family Medicine

## 2023-10-22 ENCOUNTER — Other Ambulatory Visit: Payer: Self-pay

## 2023-10-22 DIAGNOSIS — R7303 Prediabetes: Secondary | ICD-10-CM

## 2023-10-22 MED ORDER — METFORMIN HCL ER 500 MG PO TB24: 500.0000 mg | ORAL_TABLET | Freq: Every evening | ORAL | 3 refills | Status: DC

## 2023-10-28 NOTE — Progress Notes (Signed)
 Cardiology Clinic Note   Patient Name: Dennis Moon Date of Encounter: 10/30/2023  Primary Care Provider:  Tonna Frederic, MD Primary Cardiologist:  Peter Swaziland, MD  Patient Profile    Dennis Moon 78 year old male presents to the clinic today for follow-up evaluation of his atrial tachycardia and CAD.  Past Medical History    Past Medical History:  Diagnosis Date   CAD S/P percutaneous coronary angioplasty 12/12/2016   a. 11/2016: NSTEMI with DES to LAD and DES to LCx; b. MV low risk 2021; c. NSTEMI 11/22, LHC patent stents to LAD/LCx, 40% mLAD, 20% mRCA, stable, med mgmt. Echo 11/22 EF 60-65%, GIDD, mild LAE.   CKD (chronic kidney disease), stage III (HCC)    GERD (gastroesophageal reflux disease)    Gout    History of Clostridioides difficile infection 2024   Hyperlipidemia LDL goal <70    Hypertension    Hypothyroidism    Migraine    Open fracture dislocation of interphalangeal joint of left thumb 10/14/2022   Osteoarthritis    Rotator cuff tear    left   Sjogren's disease (HCC)    Systemic lupus erythematosus (HCC)    Past Surgical History:  Procedure Laterality Date   CATARACT EXTRACTION, BILATERAL Bilateral 2017   COLONOSCOPY  2021   CORONARY STENT INTERVENTION N/A 12/12/2016   Procedure: Coronary Stent Intervention;  Surgeon: Swaziland, Peter M, MD;  Location: [facility] INVASIVE CV LAB;  Service: Cardiovascular;  Laterality: N/A;   HERNIA REPAIR     LEFT HEART CATH AND CORONARY ANGIOGRAPHY N/A 12/12/2016   Procedure: Left Heart Cath and Coronary Angiography;  Surgeon: Swaziland, Peter M, MD;  Location: [facility] INVASIVE CV LAB;  Service: Cardiovascular;  Laterality: N/A;   LEFT HEART CATH AND CORONARY ANGIOGRAPHY N/A 03/29/2021   Procedure: LEFT HEART CATH AND CORONARY ANGIOGRAPHY;  Surgeon: Odie Benne, MD;  Location: [facility] INVASIVE CV LAB;  Service: Cardiovascular;  Laterality: N/A;   THYROID  SURGERY  1997    Allergies  Allergies  Allergen  Reactions   Bee Venom Swelling    Facial, hand and foot swelling    History of Present Illness    Dennis Moon has a PMH of HLD, HTN, CKD stage III G1 DD and atrial tachycardia.  His PMH also includes gout, lupus, Sjogren's, OA, and degenerative disc disease.  He was admitted to [facility] 7/18 for evaluation of chest discomfort.  His EKG showed no acute changes in his troponins.  However, cardiac troponins peaked at 0.40.  Cardiac catheterization was performed and showed two-vessel obstructive coronary disease with 90% proximal-LAD stenosis and 80% proximal circumflex stenosis.  PCI was performed.  And he received DES to his LAD and circumflex.  He was placed on aspirin  and Brilinta .  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  He had subsequent chest discomfort and nuclear stress testing was ordered.  Testing was normal.  It was felt that his symptoms were related to reflux.  He presented to [facility] 03/29/2021 with GERD symptoms and chest pain.  He was ruled in for NSTEMI.  He underwent repeat catheterization which showed patent stents of his LAD and circumflex.  He was noted to have 40% mid LAD and 20% mid RCA stenosis.  Medical management was recommended.  Echocardiogram at that time showed an EF of 60-65%, G1 DD and mild left atrial enlargement.  He was seen in follow-up by Dr. Swaziland on 03/23/2023.  During that time he  was doing well.  He did report that his heart rate in the office on Tuesday and Thursday had been over 120.  His Kardia mobile device showed possible atrial fibrillation on 1 occasion.  Otherwise his rhythms were noted to be okay.  He woke up 1 morning with heartburn which was unrelieved by antacids but relieved by sublingual nitroglycerin  x 1.  He denied further episodes of chest discomfort.  He was generally doing well.  He presented to the clinic 04/03/23 for follow-up evaluation and stated he figured out that his pharmacy mail order service had not  included his beta-blocker medication.  Since being back on his propranolol  his heart rate had returned to normal.  He had been monitoring his blood pressure and heart rate at home.   He is a pleasant gentleman.  We discussed dentistry and the fluoride and [place] water sources.  I continued his current medication regimen and planned follow-up in 6 months.  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  He is fairly physically active.  He does note some limitations due to his low back pain and neck pain.  These are minimal.  He continues to use mail order pharmacy.  He has had no further issues with this.  We reviewed his previous cardiology visit.  He did report that he was diagnosed with skin lupus and was placed on Plaquenil .  This caused some toxicity and affected his retina.  He recognized this and discontinued the Plaquenil .  Now he follows with retinal specialist.  His vision has stabilized and somewhat improved since he initially noted his symptoms.  I will plan to repeat his fasting lipids and LFTs in November.  We will continue his current medication regimen and plan follow-up in 6 months.  Today he denies chest pain, shortness of breath, lower extremity edema, melena, hematuria, hemoptysis, diaphoresis, weakness, presyncope, syncope, orthopnea, and PND.    Home Medications    Prior to Admission medications   Medication Sig Start Date End Date Taking? Authorizing Provider  acyclovir  cream (ZOVIRAX ) 5 % Apply 1 Application topically every 3 (three) hours. 02/07/22   Tonna Frederic, MD  albuterol  (VENTOLIN  HFA) 108 [phone] Base) MCG/ACT inhaler INHALE ONE (1) PUFF BY MOUTH INTO LUNGS EVERY 6 HOURS AS NEEDED FOR WHEEZING AND/OR FOR SHORTNESS OF BREATH *REFILL REQUEST* 01/23/23   Tonna Frederic, MD  allopurinol  (ZYLOPRIM ) 300 MG tablet TAKE 1 TABLET BY MOUTH EVERY EVENING 01/22/23   Rice, Haig Levan, MD  amLODipine  (NORVASC ) 5 MG  tablet Take 1 tablet (5 mg total) by mouth daily. 01/23/23   Tonna Frederic, MD  Ascorbic Acid (VITAMIN C) 1000 MG tablet Take 1,000 mg by mouth every morning.    [provider]  aspirin -acetaminophen -caffeine (EXCEDRIN EXTRA STRENGTH) 250-250-65 MG tablet Take 2 tablets by mouth 3 (three) times daily as needed for headache or migraine (pain).    [provider]  atorvastatin  (LIPITOR ) 80 MG tablet TAKE 1 TABLET(80 MG) BY MOUTH EVERY EVENING 01/26/23   Swaziland, Peter M, MD  Calcium  Carb-Cholecalciferol (CALCIUM  600 + D PO) Take 1 tablet by mouth every morning.    [provider]  cholecalciferol (VITAMIN D) 25 MCG (1000 UNIT) tablet Take 1,000 Units by mouth every morning.    [provider]  clobetasol  cream (TEMOVATE ) 0.05 % Apply to affected area twice daily as needed for up to 2 weeks 02/07/22   Tonna Frederic, MD  [facility]  EXTRACT PO Take 4,200 mg by mouth 2 (two) times daily.    [provider]  DULoxetine  (CYMBALTA ) 60 MG capsule Take 1 capsule (60 mg total) by mouth every evening. 01/23/23   Tonna Frederic, MD  EPINEPHrine  0.3 mg/0.3 mL IJ SOAJ injection Inject 0.3 mg into the muscle See admin instructions. 02/12/23   Tonna Frederic, MD  esomeprazole  (NEXIUM ) 20 MG capsule Take 1 capsule (20 mg total) by mouth 2 (two) times daily before a meal. 01/23/23   Tonna Frederic, MD  fluorometholone (FML) 0.1 % ophthalmic suspension Place 1 drop into both eyes daily as needed (dry eyes/irritation). 05/14/20   [provider]  levocetirizine (XYZAL) 5 MG tablet Take 5 mg by mouth daily as needed (seasonal allergies).    [provider]  levothyroxine  (SYNTHROID ) 112 MCG tablet TAKE 1 TABLET BY MOUTH EVERY EVENING 02/27/23   Tonna Frederic, MD  metFORMIN  (GLUCOPHAGE -XR) 500 MG 24 hr tablet Take 1 tablet (500 mg total) by mouth every evening. 01/23/23   Tonna Frederic, MD  Multiple Vitamin  (MULTIVITAMIN WITH MINERALS) TABS tablet Take 1 tablet by mouth every morning. One a Day for Men over 61    [provider]  nitroGLYCERIN  (NITROSTAT ) 0.4 MG SL tablet DISSOLVE 1 TABLET UNDER THE TONGUE EVERY 5 MINUTES AS NEEDED FOR CHEST PAIN. DO NOT EXCEED A TOTAL OF 3 DOSES IN 15 MINUTES. 09/26/22   Swaziland, Peter M, MD  Polyethyl Glycol-Propyl Glycol (SYSTANE) 0.4-0.3 % SOLN Place 1 drop into both eyes 2 (two) times daily as needed (dry eyes).    [provider]  predniSONE  (DELTASONE ) 50 MG tablet Take 1 tablet daily for 5 days. 07/08/22   Catheryn Cluck, MD  Probiotic Product (PROBIOTIC PO) Take 1 capsule by mouth every morning.    [provider]  propranolol  (INDERAL ) 60 MG tablet TAKE ONE TABLET BY MOUTH TWICE DAILY 01/23/23   Tonna Frederic, MD  testosterone  (ANDROGEL ) 50 MG/5GM (1%) GEL apply FIVE grams (ONE PACKET) onto THE SKIN TWICE DAILY 03/10/23   Tonna Frederic, MD  Ubrogepant  (UBRELVY ) 50 MG TABS Take one at first sign of migraine. May repeat once in 2 hours. Patient taking differently: Take 50 mg by mouth See admin instructions. Take one tablet (50 mg) by mouth at first sign of migraine; may repeat once in 2 hours if still needed 09/07/20   Tonna Frederic, MD  zinc gluconate 50 MG tablet Take 50 mg by mouth every morning.    [provider]    Family History    Family History  Problem Relation Age of Onset   Diabetes Mother    Hypertension Mother    Hypertension Father    Healthy Son    He indicated that his mother is deceased. He indicated that his father is deceased. He indicated that his maternal grandmother is deceased. He indicated that his maternal grandfather is deceased. He indicated that his paternal grandmother is deceased. He indicated that his paternal grandfather is deceased. He indicated that his son is alive.  Social History    Social History   Socioeconomic History   Marital status: Married     Spouse name: Not on file   Number of children: Not on file   Years of education: Not on file   Highest education level: Professional school degree (e.g., MD, DDS, DVM, JD)  Occupational History   Occupation: Dentist  Tobacco Use   Smoking status: Former  Current packs/day: 0.00    Average packs/day: 1 pack/day for 6.0 years (6.0 ttl pk-yrs)    Types: Cigarettes    Start date: 04/25/1960    Quit date: 04/25/1966    Years since quitting: 57.5    Passive exposure: Past   Smokeless tobacco: Never  Vaping Use   Vaping status: Never Used  Substance and Sexual Activity   Alcohol use: Yes    Alcohol/week: 7.0 standard drinks of alcohol    Types: 7 Glasses of wine per week   Drug use: Never   Sexual activity: Not on file  Other Topics Concern   Not on file  Social History Narrative   Not on file   Social Drivers of Health   Financial Resource Strain: Low Risk  (06/25/2023)   Overall Financial Resource Strain (CARDIA)    Difficulty of Paying Living Expenses: Not hard at all  Food Insecurity: No Food Insecurity (06/25/2023)   Hunger Vital Sign    Worried About Running Out of Food in the Last Year: Never true    Ran Out of Food in the Last Year: Never true  Transportation Needs: No Transportation Needs (06/25/2023)   PRAPARE - Administrator, Civil Service (Medical): No    Lack of Transportation (Non-Medical): No  Physical Activity: Inactive (06/25/2023)   Exercise Vital Sign    Days of Exercise per Week: 0 days    Minutes of Exercise per Session: 20 min  Stress: No Stress Concern Present (06/25/2023)   [employer] of Occupational Health - Occupational Stress Questionnaire    Feeling of Stress : Not at all  Social Connections: Socially Integrated (06/25/2023)   Social Connection and Isolation Panel    Frequency of Communication with Friends and Family: More than three times a week    Frequency of Social Gatherings with Friends and Family: Three times a week    Attends  Religious Services: More than 4 times per year    Active Member of Clubs or Organizations: Yes    Attends Banker Meetings: More than 4 times per year    Marital Status: Married  Catering manager Violence: Not At Risk (12/19/2022)   Humiliation, Afraid, Rape, and Kick questionnaire    Fear of Current or Ex-Partner: No    Emotionally Abused: No    Physically Abused: No    Sexually Abused: No     Review of Systems    General:  No chills, fever, night sweats or weight changes.  Cardiovascular:  No chest pain, dyspnea on exertion, edema, orthopnea, palpitations, paroxysmal nocturnal dyspnea. Dermatological: No rash, lesions/masses Respiratory: No cough, dyspnea Urologic: No hematuria, dysuria Abdominal:   No nausea, vomiting, diarrhea, bright red blood per rectum, melena, or hematemesis Neurologic:  No visual changes, wkns, changes in mental status. All other systems reviewed and are otherwise negative except as noted above.  Physical Exam    VS:  BP 116/64 (BP Location: Right Arm, Patient Position: Sitting, Cuff Size: Normal)   Pulse 70   Ht 6' (1.829 m)   Wt 226 lb 12.8 oz (102.9 kg)   SpO2 94%   BMI 30.76 kg/m  , BMI Body mass index is 30.76 kg/m. GEN: Well nourished, well developed, in no acute distress. HEENT: normal. Neck: Supple, no JVD, carotid bruits, or masses. Cardiac: RRR, no murmurs, rubs, or gallops. No clubbing, cyanosis, edema.  Radials/DP/PT 2+ and equal bilaterally.  Respiratory:  Respirations regular and unlabored, clear to auscultation bilaterally. GI: Soft, nontender,  nondistended, BS + x 4. MS: no deformity or atrophy. Skin: warm and dry, no rash. Neuro:  Strength and sensation are intact. Psych: Normal affect.  Accessory Clinical Findings    Recent Labs: 06/26/2023: Hemoglobin 16.9; Platelets 197.0; TSH 2.95 07/24/2023: ALT 37; BUN 24; Creat 1.28; Potassium 4.7; Sodium 141   Recent Lipid Panel    Component Value Date/Time   CHOL 125  03/23/2023 1400   TRIG 133 03/23/2023 1400   HDL 47 03/23/2023 1400   CHOLHDL 2.7 03/23/2023 1400   CHOLHDL 3 02/14/2022 0753   VLDL 24.2 02/14/2022 0753   LDLCALC 55 03/23/2023 1400         ECG personally reviewed by me today-none today.   Cardiac cath 03/29/21:   LEFT HEART CATH AND CORONARY ANGIOGRAPHY    Conclusion       Prox RCA lesion is 20% stenosed.   Mid LAD lesion is 40% stenosed.   Previously placed Ost Cx to Prox Cx stent (unknown type) is  widely patent.   Previously placed Ost LAD to Prox LAD stent (unknown type) is  widely patent.   Stable two vessel CAD Patent proximal LAD stent with mild mid LAD stenosis beyond the stent Patent proximal circumflex stent Mild plaque mid RCA.    Recommendations: Continue medical management of CAD. Echo in the am. Monitor on telemetry tonight.   Dominance: Right Intervention     Echo 03/30/21: IMPRESSIONS     1. Left ventricular ejection fraction, by estimation, is 60 to 65%. The  left ventricle has normal function. The left ventricle has no regional  wall motion abnormalities. There is mild left ventricular hypertrophy.  Left ventricular diastolic parameters  are consistent with Grade I diastolic dysfunction (impaired relaxation).   2. Right ventricular systolic function is normal. The right ventricular  size is normal.   3. Left atrial size was mildly dilated.   4. The mitral valve is normal in structure. No evidence of mitral valve  regurgitation. No evidence of mitral stenosis.   5. The aortic valve is normal in structure. There is moderate  calcification of the aortic valve. There is mild thickening of the aortic  valve. Aortic valve regurgitation is not visualized. Aortic valve  sclerosis/calcification is present, without any  evidence of aortic stenosis.   6. The inferior vena cava is normal in size with greater than 50%  respiratory variability, suggesting right atrial pressure of 3 mmHg.         Assessment & Plan   1. Atrial tachycardia-heart rate today 70 bpm.  Heart rate has remained stable on beta-blocker medication. Continue propranolol  Avoid triggers caffeine, chocolate, EtOH, dehydration etc.-reviewed Maintain physical activity   Coronary artery disease-remains very physically active.  Denies chest pain.  Underwent cardiac catheterization 11/22 showed proximal RCA 20%, mid LAD 40%, and patent LAD and circumflex stents.  Prior echocardiogram reassuring.   Continue amlodipine , atorvastatin , propranolol  Heart healthy low-sodium diet-reviewed   Hyperlipidemia-LDL 55 on 03/23/2023. High-fiber diet Maintain physical activity Continue atorvastatin  Plan for repeat fasting lipids and LFTs 11/25  G1 DD-no increased DOE or activity intolerance.  Continues to be euvolemic.Aaron Aas  Echocardiogram 11/22 showed stable EF and G1 DD. Heart healthy low-sodium diet Continue losartan Monitor weights   Disposition: Follow-up with Dr. Swaziland or me in  6 months.   Chet Cota. Kevionna Heffler NP-C     10/30/2023, 1:49 PM [facility] [address] Office [phone] Fax [phone]    I spent 14  minutes examining this patient, reviewing medications, and using patient centered shared decision making involving her cardiac care.   I spent greater than 20 minutes reviewing her past medical history,  medications, and prior cardiac tests.

## 2023-10-30 ENCOUNTER — Ambulatory Visit: Attending: General Practice | Admitting: General Practice

## 2023-10-30 ENCOUNTER — Ambulatory Visit: Admitting: General Practice

## 2023-10-30 ENCOUNTER — Encounter: Payer: Self-pay | Admitting: General Practice

## 2023-10-30 VITALS — BP 116/64 | HR 70 | Ht 72.0 in | Wt 226.8 lb

## 2023-10-30 DIAGNOSIS — E785 Hyperlipidemia, unspecified: Secondary | ICD-10-CM

## 2023-10-30 DIAGNOSIS — I2510 Atherosclerotic heart disease of native coronary artery without angina pectoris: Secondary | ICD-10-CM | POA: Diagnosis not present

## 2023-10-30 DIAGNOSIS — H43813 Vitreous degeneration, bilateral: Secondary | ICD-10-CM | POA: Diagnosis not present

## 2023-10-30 DIAGNOSIS — I5189 Other ill-defined heart diseases: Secondary | ICD-10-CM

## 2023-10-30 DIAGNOSIS — R000 Tachycardia, unspecified: Secondary | ICD-10-CM

## 2023-10-30 DIAGNOSIS — H35383 Toxic maculopathy, bilateral: Secondary | ICD-10-CM | POA: Diagnosis not present

## 2023-10-30 NOTE — Patient Instructions (Addendum)
 Medication Instructions:  Your physician recommends that you continue on your current medications as directed. Please refer to the Current Medication list given to you today.  *If you need a refill on your cardiac medications before your next appointment, please call your pharmacy*  Lab Work: Lipids and LFTs- November  You may go to any [facility] Location for your lab work:  [facility] - [id number] ([facility]) - [address] [phone] [address]  [place] - [address]   [place]  - [address]   [place] - [address] - [address] [employer]  - [address] [place] [address] ([facility]   If you have labs (blood work) drawn today and your tests are completely normal, you will receive your results only by: [employer] (if you have MyChart)  If you have any lab test that is abnormal or we need to change your treatment, we will call you or send a MyChart message to review the results.  Testing/Procedures: None ordered.  Follow-Up: At [facility], you and your health needs are our priority.  As part of our continuing mission to provide you with exceptional heart care, we have created designated Provider Care Teams.  These Care Teams include your primary Cardiologist (physician) and Advanced Practice Providers (APPs -  Physician Assistants and Nurse Practitioners) who all work together to provide you with the care you need, when you need it.  Your next appointment:   6 months  The format for your next appointment:   In Person  Provider:   Oletta Berry, NP Or Dr Swaziland

## 2023-11-04 ENCOUNTER — Encounter: Payer: Self-pay | Admitting: Family Medicine

## 2023-11-04 DIAGNOSIS — E291 Testicular hypofunction: Secondary | ICD-10-CM

## 2023-11-06 MED ORDER — TESTOSTERONE CYPIONATE 200 MG/ML IM SOLN: 200.0000 mg | INTRAMUSCULAR | 0 refills | Status: DC

## 2023-11-25 ENCOUNTER — Other Ambulatory Visit: Payer: Self-pay | Admitting: Family Medicine

## 2023-12-21 ENCOUNTER — Encounter: Payer: Self-pay | Admitting: Cardiology

## 2023-12-24 ENCOUNTER — Other Ambulatory Visit: Payer: Self-pay | Admitting: Family Medicine

## 2023-12-24 DIAGNOSIS — E291 Testicular hypofunction: Secondary | ICD-10-CM

## 2023-12-25 ENCOUNTER — Ambulatory Visit: Payer: Medicare Other

## 2023-12-25 ENCOUNTER — Encounter: Payer: Self-pay | Admitting: Rheumatology

## 2023-12-25 VITALS — BP 116/62 | HR 61 | Temp 97.9°F | Ht 71.5 in | Wt 217.6 lb

## 2023-12-25 DIAGNOSIS — Z0000 Encounter for general adult medical examination without abnormal findings: Secondary | ICD-10-CM | POA: Diagnosis not present

## 2023-12-25 MED ORDER — ALLOPURINOL 300 MG PO TABS: 300.0000 mg | ORAL_TABLET | Freq: Every evening | ORAL | 0 refills | Status: DC

## 2023-12-25 NOTE — Progress Notes (Signed)
 Subjective:   Dennis Moon is a 78 y.o. who presents for a Medicare Wellness preventive visit.  As a reminder, Annual Wellness Visits don't include a physical exam, and some assessments may be limited, especially if this visit is performed virtually. We may recommend an in-person follow-up visit with your provider if needed.  Visit Complete: In person    Persons Participating in Visit: Patient.  AWV Questionnaire: Yes: Patient Medicare AWV questionnaire was completed by the patient on 12/25/2023; I have confirmed that all information answered by patient is correct and no changes since this date.  Cardiac Risk Factors include: advanced age (>47men, >70 women);male gender     Objective:    Today's Vitals   12/25/23 1300  BP: 116/62  Pulse: 61  Temp: 97.9 F (36.6 C)  TempSrc: Oral  SpO2: 93%  Weight: 217 lb 9.6 oz (98.7 kg)  Height: 5' 11.5 (1.816 m)   Body mass index is 29.93 kg/m.     12/25/2023    1:12 PM 12/19/2022    1:17 PM 11/07/2021    2:08 PM 03/29/2021   11:12 AM 11/02/2019    8:17 AM 12/12/2016    2:20 AM  Advanced Directives  Does Patient Have a Medical Advance Directive? Yes Yes Yes No Yes Yes   Type of Estate agent of [place];Living will Healthcare Power of [place];Living will Healthcare Power of [place];Living will  Healthcare Power of [place];Living will Living will  Does patient want to make changes to medical advance directive?      No - Patient declined   Copy of Healthcare Power of Attorney in Chart?  No - copy requested No - copy requested  No - copy requested      Data saved with a previous flowsheet row definition    Current Medications (verified) Outpatient Encounter Medications as of 12/25/2023  Medication Sig   acyclovir  ointment (ZOVIRAX ) 5 % Apply 1 Application topically every 3 (three) hours.   albuterol  (VENTOLIN  HFA) 108 (90 Base) MCG/ACT inhaler INHALE ONE (1) PUFF BY MOUTH INTO LUNGS EVERY 6 HOURS AS NEEDED  FOR WHEEZING AND/OR FOR SHORTNESS OF BREATH *REFILL REQUEST*   allopurinol  (ZYLOPRIM ) 300 MG tablet Take 1 tablet (300 mg total) by mouth every evening.   amLODipine  (NORVASC ) 5 MG tablet TAKE 1 TABLET BY MOUTH DAILY   Ascorbic Acid (VITAMIN C) 1000 MG tablet Take 1,000 mg by mouth every morning.   aspirin -acetaminophen -caffeine (EXCEDRIN EXTRA STRENGTH) 250-250-65 MG tablet Take 2 tablets by mouth 3 (three) times daily as needed for headache or migraine (pain).   atorvastatin  (LIPITOR ) 80 MG tablet TAKE 1 TABLET(80 MG) BY MOUTH EVERY EVENING   Calcium  Carb-Cholecalciferol (CALCIUM  600 + D PO) Take 1 tablet by mouth every morning.   cholecalciferol (VITAMIN D) 25 MCG (1000 UNIT) tablet Take 1,000 Units by mouth every morning.   DULoxetine  (CYMBALTA ) 60 MG capsule Take 1 capsule (60 mg total) by mouth every evening.   EPINEPHRINE  0.3 mg/0.3 mL IJ SOAJ injection INJECT 0.3 MG INTO THE MUSCLE SEE ADMIN INSTRUCTIONS FOR ALLERGIC REACTION.   esomeprazole  (NEXIUM ) 20 MG capsule TAKE ONE (1) CAPSULE BY MOUTH TWICE DAILY BEFORE A MEAL   fluorometholone (FML) 0.1 % ophthalmic suspension Place 1 drop into both eyes daily as needed (dry eyes/irritation).   levocetirizine (XYZAL) 5 MG tablet Take 5 mg by mouth daily as needed (seasonal allergies).   levothyroxine  (SYNTHROID ) 112 MCG tablet TAKE 1 TABLET BY MOUTH EVERY EVENING   losartan (COZAAR)  25 MG tablet Take 25 mg by mouth daily.   metFORMIN  (GLUCOPHAGE -XR) 500 MG 24 hr tablet Take 1 tablet (500 mg total) by mouth every evening.   Multiple Vitamin (MULTIVITAMIN WITH MINERALS) TABS tablet Take 1 tablet by mouth every morning. One a Day for Men over 50   nitroGLYCERIN  (NITROSTAT ) 0.4 MG SL tablet DISSOLVE 1 TABLET UNDER THE TONGUE EVERY 5 MINUTES AS NEEDED FOR CHEST PAIN. DO NOT EXCEED A TOTAL OF 3 DOSES IN 15 MINUTES.   Probiotic Product (PROBIOTIC PO) Take 1 capsule by mouth every morning.   propranolol  (INDERAL ) 60 MG tablet TAKE ONE TABLET BY MOUTH  TWICE DAILY   testosterone  cypionate (DEPOTESTOSTERONE CYPIONATE) 200 MG/ML injection INJECT 1 ML (200 MG) INTO THE MUSCLE EVERY 14 DAYS   VEVYE 0.1 % SOLN Apply 1 drop to eye 2 (two) times daily.   zinc gluconate 50 MG tablet Take 50 mg by mouth every morning.   clobetasol  cream (TEMOVATE ) 0.05 % Apply to affected area twice daily as needed for up to 2 weeks (Patient not taking: Reported on 12/25/2023)   CRANBERRY EXTRACT PO Take 4,200 mg by mouth 2 (two) times daily. (Patient not taking: Reported on 12/25/2023)   Ubrogepant  (UBRELVY ) 50 MG TABS Take one at first sign of migraine. May repeat once in 2 hours. (Patient not taking: Reported on 12/25/2023)   [DISCONTINUED] allopurinol  (ZYLOPRIM ) 300 MG tablet Take 1 tablet (300 mg total) by mouth every evening.   [DISCONTINUED] testosterone  cypionate (DEPOTESTOSTERONE CYPIONATE) 200 MG/ML injection Inject 1 mL (200 mg total) into the muscle every 14 (fourteen) days.   No facility-administered encounter medications on file as of 12/25/2023.    Allergies (verified) Bee venom   History: Past Medical History:  Diagnosis Date   CAD S/P percutaneous coronary angioplasty 12/12/2016   a. 11/2016: NSTEMI with DES to LAD and DES to LCx; b. MV low risk 2021; c. NSTEMI 11/22, LHC patent stents to LAD/LCx, 40% mLAD, 20% mRCA, stable, med mgmt. Echo 11/22 EF 60-65%, GIDD, mild LAE.   CKD (chronic kidney disease), stage III (HCC)    GERD (gastroesophageal reflux disease)    Gout    History of Clostridioides difficile infection 2024   Hyperlipidemia LDL goal <70    Hypertension    Hypothyroidism    Migraine    Open fracture dislocation of interphalangeal joint of left thumb 10/14/2022   Osteoarthritis    Rotator cuff tear    left   Sjogren's disease (HCC)    Systemic lupus erythematosus (HCC)    Past Surgical History:  Procedure Laterality Date   CATARACT EXTRACTION, BILATERAL Bilateral 2017   COLONOSCOPY  2021   CORONARY STENT INTERVENTION N/A  12/12/2016   Procedure: Coronary Stent Intervention;  Surgeon: Swaziland, Peter M, MD;  Location: [facility] INVASIVE CV LAB;  Service: Cardiovascular;  Laterality: N/A;   HERNIA REPAIR     LEFT HEART CATH AND CORONARY ANGIOGRAPHY N/A 12/12/2016   Procedure: Left Heart Cath and Coronary Angiography;  Surgeon: Swaziland, Peter M, MD;  Location: [facility] INVASIVE CV LAB;  Service: Cardiovascular;  Laterality: N/A;   LEFT HEART CATH AND CORONARY ANGIOGRAPHY N/A 03/29/2021   Procedure: LEFT HEART CATH AND CORONARY ANGIOGRAPHY;  Surgeon: Verlin Lonni BIRCH, MD;  Location: [facility] INVASIVE CV LAB;  Service: Cardiovascular;  Laterality: N/A;   THYROID  SURGERY  8002   Family History  Problem Relation Age of Onset   Diabetes Mother    Hypertension Mother    Hypertension Father  Healthy Son    Social History   Socioeconomic History   Marital status: Married    Spouse name: Not on file   Number of children: Not on file   Years of education: Not on file   Highest education level: Professional school degree (e.g., MD, DDS, DVM, JD)  Occupational History   Occupation: Dentist  Tobacco Use   Smoking status: Former    Current packs/day: 0.00    Average packs/day: 1 pack/day for 6.0 years (6.0 ttl pk-yrs)    Types: Cigarettes    Start date: 04/25/1960    Quit date: 04/25/1966    Years since quitting: 57.7    Passive exposure: Past   Smokeless tobacco: Never  Vaping Use   Vaping status: Never Used  Substance and Sexual Activity   Alcohol use: Yes    Alcohol/week: 7.0 standard drinks of alcohol    Types: 7 Glasses of wine per week   Drug use: Never   Sexual activity: Not on file  Other Topics Concern   Not on file  Social History Narrative   Not on file   Social Drivers of Health   Financial Resource Strain: Low Risk  (12/25/2023)   Overall Financial Resource Strain (CARDIA)    Difficulty of Paying Living Expenses: Not very hard  Food Insecurity: No Food Insecurity (12/25/2023)   Hunger Vital Sign     Worried About Running Out of Food in the Last Year: Never true    Ran Out of Food in the Last Year: Never true  Transportation Needs: No Transportation Needs (12/25/2023)   PRAPARE - Administrator, Civil Service (Medical): No    Lack of Transportation (Non-Medical): No  Physical Activity: Inactive (12/25/2023)   Exercise Vital Sign    Days of Exercise per Week: 0 days    Minutes of Exercise per Session: 0 min  Stress: No Stress Concern Present (12/25/2023)   [employer] of Occupational Health - Occupational Stress Questionnaire    Feeling of Stress: Only a little  Social Connections: Socially Integrated (12/25/2023)   Social Connection and Isolation Panel    Frequency of Communication with Friends and Family: Twice a week    Frequency of Social Gatherings with Friends and Family: Once a week    Attends Religious Services: More than 4 times per year    Active Member of [employer] or Organizations: Yes    Attends Engineer, structural: More than 4 times per year    Marital Status: Married    Tobacco Counseling Counseling given: Not Answered    Clinical Intake:  Pre-visit preparation completed: Yes  Pain : No/denies pain     Nutritional Status: BMI 25 -29 Overweight Nutritional Risks: None Diabetes: No  Lab Results  Component Value Date   HGBA1C 6.2 07/14/2023   HGBA1C 5.9 (H) 07/19/2021   HGBA1C 6.4 (H) 03/29/2021     How often do you need to have someone help you when you read instructions, pamphlets, or other written materials from your doctor or pharmacy?: 1 - Never  Interpreter Needed?: No  Information entered by :: NAllen LPN   Activities of Daily Living     12/25/2023    1:02 PM 12/25/2023    9:55 AM  In your present state of health, do you have any difficulty performing the following activities:  Hearing? 1 0  Comment has hearing aids   Vision? 1 0  Comment sees eye doctor regularly   Difficulty concentrating or making  decisions? 0 0   Walking or climbing stairs? 0 0  Dressing or bathing? 0 0  Doing errands, shopping? 0 0  Preparing Food and eating ? N N  Using the Toilet? N N  In the past six months, have you accidently leaked urine? N N  Do you have problems with loss of bowel control? N N  Managing your Medications? N N  Managing your Finances? N N  Housekeeping or managing your Housekeeping? N N    Patient Care Team: Berneta Elsie Sayre, MD as PCP - General (Family Medicine) [place], Peter M, MD as PCP - Cardiology (Cardiology) Dolphus Reiter, MD as Consulting Physician (Rheumatology)  I have updated your Care Teams any recent Medical Services you may have received from other providers in the past year.     Assessment:   This is a routine wellness examination for Dennis Moon.  Hearing/Vision screen Hearing Screening - Comments:: Has hearing aids that are maintained Vision Screening - Comments:: Regular eye exams, [facility]    Goals Addressed             This Visit's Progress    Patient Stated       12/25/2023, keep weight down       Depression Screen     12/25/2023    1:15 PM 12/19/2022    1:23 PM 11/21/2022   11:09 AM 02/07/2022    1:30 PM 11/07/2021    2:08 PM 11/07/2021    2:06 PM 07/26/2021   10:08 AM  PHQ 2/9 Scores  PHQ - 2 Score 0 0 0 0 0 0 0  PHQ- 9 Score 0 0         Fall Risk     12/25/2023    9:55 AM 12/19/2022    1:18 PM 11/21/2022   11:08 AM 02/07/2022    1:30 PM 11/07/2021    2:09 PM  Fall Risk   Falls in the past year? 1 1 0 0 0  Comment got caught on a wire slipped     Number falls in past yr: 0 1 0 0 0  Injury with Fall? 0 1 0  0  Comment  dislocated thumb     Risk for fall due to : Impaired balance/gait;Medication side effect Medication side effect;Impaired balance/gait;History of fall(s) No Fall Risks    Follow up Falls prevention discussed;Falls evaluation completed Falls prevention discussed;Falls evaluation completed Falls evaluation completed  Falls evaluation completed       Data saved with a previous flowsheet row definition    MEDICARE RISK AT HOME:  Medicare Risk at Home Any stairs in or around the home?: (Patient-Rptd) Yes If so, are there any without handrails?: (Patient-Rptd) No Home free of loose throw rugs in walkways, pet beds, electrical cords, etc?: (Patient-Rptd) Yes Adequate lighting in your home to reduce risk of falls?: (Patient-Rptd) Yes Life alert?: (Patient-Rptd) Yes Use of a cane, walker or w/c?: (Patient-Rptd) Yes Grab bars in the bathroom?: (Patient-Rptd) Yes Shower chair or bench in shower?: (Patient-Rptd) Yes Elevated toilet seat or a handicapped toilet?: (Patient-Rptd) Yes  TIMED UP AND GO:  Was the test performed?  Yes  Length of time to ambulate 10 feet: 5 sec Gait steady and fast without use of assistive device  Cognitive Function: 6CIT completed        12/25/2023    1:17 PM  6CIT Screen  What Year? 0 points  What month? 0 points  What time? 0 points  Count back from  20 0 points  Months in reverse 0 points  Repeat phrase 2 points  Total Score 2 points    Immunizations Immunization History  Administered Date(s) Administered   Influenza Whole 02/25/2021   Influenza, High Dose Seasonal PF 02/07/2022   Influenza-Unspecified 01/28/2013, 02/16/2014, 02/24/2018, 02/04/2019, 02/16/2020   PFIZER(Purple Top)SARS-COV-2 Vaccination 06/17/2019, 07/08/2019, 02/16/2020   Pneumococcal Conjugate-13 10/21/2013   Pneumococcal Polysaccharide-23 08/02/2008, 03/14/2016   Tdap 07/30/2007, 07/18/2019   Zoster Recombinant(Shingrix) 11/12/2017   Zoster, Live 08/28/2006    Screening Tests Health Maintenance  Topic Date Due   Zoster Vaccines- Shingrix (2 of 2) 01/07/2018   INFLUENZA VACCINE  12/18/2023   Medicare Annual Wellness (AWV)  12/24/2024   DTaP/Tdap/Td (3 - Td or Tdap) 07/17/2029   Pneumococcal Vaccine: 50+ Years  Completed   Hepatitis C Screening  Completed   Hepatitis B Vaccines  Aged Out   HPV VACCINES  Aged Out    Meningococcal B Vaccine  Aged Out   Colonoscopy  Discontinued   COVID-19 Vaccine  Discontinued    Health Maintenance  Health Maintenance Due  Topic Date Due   Zoster Vaccines- Shingrix (2 of 2) 01/07/2018   INFLUENZA VACCINE  12/18/2023   Health Maintenance Items Addressed: States had both shingles vaccines.  Additional Screening:  Vision Screening: Recommended annual ophthalmology exams for early detection of glaucoma and other disorders of the eye. Would you like a referral to an eye doctor? No    Dental Screening: Recommended annual dental exams for proper oral hygiene  Community Resource Referral / Chronic Care Management: CRR required this visit?  No   CCM required this visit?  No   Plan:    I have personally reviewed and noted the following in the patient's chart:   Medical and social history Use of alcohol, tobacco or illicit drugs  Current medications and supplements including opioid prescriptions. Patient is not currently taking opioid prescriptions. Functional ability and status Nutritional status Physical activity Advanced directives List of other physicians Hospitalizations, surgeries, and ER visits in previous 12 months Vitals Screenings to include cognitive, depression, and falls Referrals and appointments  In addition, I have reviewed and discussed with patient certain preventive protocols, quality metrics, and best practice recommendations. A written personalized care plan for preventive services as well as general preventive health recommendations were provided to patient.   Dennis FORBES Dawn, LPN   05/19/7972   After Visit Summary: (In Person-Printed) AVS printed and given to the patient  Notes: Nothing significant to report at this time.

## 2023-12-25 NOTE — Patient Instructions (Addendum)
 Mr. Yono , Thank you for taking time out of your busy schedule to complete your Annual Wellness Visit with me. I enjoyed our conversation and look forward to speaking with you again next year. I, as well as your care team,  appreciate your ongoing commitment to your health goals. Please review the following plan we discussed and let me know if I can assist you in the future. Your Game plan/ To Do List    Referrals: If you haven't heard from the office you've been referred to, please reach out to them at the phone provided.   Follow up Visits: We will see or speak with you next year for your Next Medicare AWV with our clinical staff Have you seen your provider in the last 6 months (3 months if uncontrolled diabetes)? Yes  Clinician Recommendations:  Aim for 30 minutes of exercise or brisk walking, 6-8 glasses of water, and 5 servings of fruits and vegetables each day.       This is a list of the screenings recommended for you:  Health Maintenance  Topic Date Due   Zoster (Shingles) Vaccine (2 of 2) 01/07/2018   Flu Shot  12/18/2023   Medicare Annual Wellness Visit  12/24/2024   DTaP/Tdap/Td vaccine (3 - Td or Tdap) 07/17/2029   Pneumococcal Vaccine for age over 50  Completed   Hepatitis C Screening  Completed   Hepatitis B Vaccine  Aged Out   HPV Vaccine  Aged Out   Meningitis B Vaccine  Aged Out   Colon Cancer Screening  Discontinued   COVID-19 Vaccine  Discontinued    Advanced directives: (Copy Requested) Please bring a copy of your health care power of attorney and living will to the office to be added to your chart at your convenience. You can mail to [facility] [address] [place], [place] [address] or email to [email or web address] Advance Care Planning is important because it:  [x]  Makes sure you receive the medical care that is consistent with your values, goals, and preferences  [x]  It provides guidance to your family and loved ones and reduces  their decisional burden about whether or not they are making the right decisions based on your wishes.  Follow the link provided in your after visit summary or read over the paperwork we have mailed to you to help you started getting your Advance Directives in place. If you need assistance in completing these, please reach out to us  so that we can help you!  See attachments for Preventive Care and Fall Prevention Tips.

## 2023-12-25 NOTE — Telephone Encounter (Signed)
 Last Fill: 06/05/2023  Labs: 07/24/2023 Uric acid is in the desirable  range.  Urine protein creatinine ratio is elevated, GFR is low at 57 and stable, ANA remains positive along with SSA antibody associated with Sjogren's, double-stranded DNA negative, complements normal and sed rate normal which do not indicate an autoimmune disease flare.  No change in treatment advised.   Next Visit: 01/29/2024   Last Visit: 07/24/2023  DX: Idiopathic chronic gout of multiple sites without tophus   Current Dose per office note 07/24/2023: Allopurinol  300 mg 1 tablet by mouth daily   Okay to refill Allopurinol ?

## 2023-12-28 ENCOUNTER — Other Ambulatory Visit: Payer: Self-pay | Admitting: Family Medicine

## 2023-12-28 DIAGNOSIS — R519 Headache, unspecified: Secondary | ICD-10-CM

## 2023-12-30 ENCOUNTER — Other Ambulatory Visit: Payer: Self-pay | Admitting: Family Medicine

## 2023-12-30 DIAGNOSIS — R519 Headache, unspecified: Secondary | ICD-10-CM

## 2023-12-31 MED ORDER — PROPRANOLOL HCL 60 MG PO TABS
ORAL_TABLET | ORAL | 3 refills | Status: AC
Start: 2023-12-31 — End: ?

## 2023-12-31 NOTE — Addendum Note (Signed)
 Addended by: BERNETA ELSIE LABOR on: 12/31/2023 09:29 AM   Modules accepted: Orders

## 2024-01-01 ENCOUNTER — Encounter: Payer: Self-pay | Admitting: Family Medicine

## 2024-01-01 ENCOUNTER — Ambulatory Visit (INDEPENDENT_AMBULATORY_CARE_PROVIDER_SITE_OTHER): Admitting: Family Medicine

## 2024-01-01 VITALS — BP 112/70 | HR 58 | Temp 97.1°F | Ht 71.0 in | Wt 217.0 lb

## 2024-01-01 DIAGNOSIS — I2510 Atherosclerotic heart disease of native coronary artery without angina pectoris: Secondary | ICD-10-CM

## 2024-01-01 DIAGNOSIS — E538 Deficiency of other specified B group vitamins: Secondary | ICD-10-CM | POA: Diagnosis not present

## 2024-01-01 DIAGNOSIS — E291 Testicular hypofunction: Secondary | ICD-10-CM | POA: Diagnosis not present

## 2024-01-01 DIAGNOSIS — Z8739 Personal history of other diseases of the musculoskeletal system and connective tissue: Secondary | ICD-10-CM | POA: Diagnosis not present

## 2024-01-01 DIAGNOSIS — Z9861 Coronary angioplasty status: Secondary | ICD-10-CM

## 2024-01-01 DIAGNOSIS — R7303 Prediabetes: Secondary | ICD-10-CM

## 2024-01-01 DIAGNOSIS — E039 Hypothyroidism, unspecified: Secondary | ICD-10-CM

## 2024-01-01 LAB — LIPID PANEL
Cholesterol: 114 mg/dL (ref 0–200)
HDL: 40.7 mg/dL (ref >39.00)
LDL Cholesterol: 51 mg/dL (ref 0–99)
NonHDL: 73.2
Total CHOL/HDL Ratio: 3
Triglycerides: 109 mg/dL (ref 0.0–149.0)
VLDL: 21.8 mg/dL (ref 0.0–40.0)

## 2024-01-01 LAB — CBC
HCT: 49.4 % (ref 39.0–52.0)
Hemoglobin: 16.1 g/dL (ref 13.0–17.0)
MCHC: 32.6 g/dL (ref 30.0–36.0)
MCV: 93 fl (ref 78.0–100.0)
Platelets: 173 K/uL (ref 150.0–400.0)
RBC: 5.31 Mil/uL (ref 4.22–5.81)
RDW: 14.3 % (ref 11.5–15.5)
WBC: 6.4 K/uL (ref 4.0–10.5)

## 2024-01-01 LAB — COMPREHENSIVE METABOLIC PANEL WITH GFR
ALT: 41 U/L (ref 0–53)
AST: 32 U/L (ref 0–37)
Albumin: 4 g/dL (ref 3.5–5.2)
Alkaline Phosphatase: 92 U/L (ref 39–117)
BUN: 27 mg/dL — ABNORMAL HIGH (ref 6–23)
CO2: 31 meq/L (ref 19–32)
Calcium: 8.9 mg/dL (ref 8.4–10.5)
Chloride: 100 meq/L (ref 96–112)
Creatinine, Ser: 1.22 mg/dL (ref 0.40–1.50)
GFR: 56.38 mL/min — ABNORMAL LOW (ref >60.00)
Glucose, Bld: 102 mg/dL — ABNORMAL HIGH (ref 70–99)
Potassium: 4.6 meq/L (ref 3.5–5.1)
Sodium: 139 meq/L (ref 135–145)
Total Bilirubin: 0.6 mg/dL (ref 0.2–1.2)
Total Protein: 7.2 g/dL (ref 6.0–8.3)

## 2024-01-01 LAB — TSH: TSH: 2.04 u[IU]/mL (ref 0.35–5.50)

## 2024-01-01 LAB — VITAMIN B12: Vitamin B-12: >1500 pg/mL — ABNORMAL HIGH (ref 211–911)

## 2024-01-01 LAB — HEMOGLOBIN A1C: Hgb A1c MFr Bld: 6.6 % — ABNORMAL HIGH (ref 4.6–6.5)

## 2024-01-01 LAB — URIC ACID: Uric Acid, Serum: 6.3 mg/dL (ref 4.0–7.8)

## 2024-01-01 NOTE — Progress Notes (Signed)
 Established Patient Office Visit   Subjective:  Patient ID: Dennis Moon, male    DOB: 1945/06/03  Age: 78 y.o. MRN: [id number]  Chief Complaint  Patient presents with   Medical Management of Chronic Issues    Follow up. Pt is fasting. No concerns    HPI Encounter Diagnoses  Name Primary?   Androgen deficiency Yes   Prediabetes    Acquired hypothyroidism    CAD S/P percutaneous coronary angioplasty    B12 deficiency    History of gout    For follow-up of above.  Doing well.  Continues with testosterone  cypionate every 2 weeks.  Last injection was on the first.   Review of Systems  Constitutional: Negative.   HENT: Negative.    Eyes:  Negative for blurred vision, discharge and redness.  Respiratory: Negative.    Cardiovascular: Negative.   Gastrointestinal:  Negative for abdominal pain.  Genitourinary: Negative.   Musculoskeletal: Negative.  Negative for myalgias.  Skin:  Negative for rash.  Neurological:  Negative for tingling, loss of consciousness and weakness.  Endo/Heme/Allergies:  Negative for polydipsia.     Current Outpatient Medications:    acyclovir  ointment (ZOVIRAX ) 5 %, Apply 1 Application topically every 3 (three) hours., Disp: 15 g, Rfl: 5   albuterol  (VENTOLIN  HFA) 108 (90 Base) MCG/ACT inhaler, INHALE ONE (1) PUFF BY MOUTH INTO LUNGS EVERY 6 HOURS AS NEEDED FOR WHEEZING AND/OR FOR SHORTNESS OF BREATH *REFILL REQUEST*, Disp: 8.5 g, Rfl: 10   allopurinol  (ZYLOPRIM ) 300 MG tablet, Take 1 tablet (300 mg total) by mouth every evening., Disp: 90 tablet, Rfl: 0   amLODipine  (NORVASC ) 5 MG tablet, TAKE 1 TABLET BY MOUTH DAILY, Disp: 90 tablet, Rfl: 2   Ascorbic Acid (VITAMIN C) 1000 MG tablet, Take 1,000 mg by mouth every morning., Disp: , Rfl:    aspirin -acetaminophen -caffeine (EXCEDRIN EXTRA STRENGTH) 250-250-65 MG tablet, Take 2 tablets by mouth 3 (three) times daily as needed for headache or migraine (pain)., Disp: , Rfl:    atorvastatin  (LIPITOR ) 80 MG  tablet, TAKE 1 TABLET(80 MG) BY MOUTH EVERY EVENING, Disp: 15 tablet, Rfl: 0   Calcium  Carb-Cholecalciferol (CALCIUM  600 + D PO), Take 1 tablet by mouth every morning., Disp: , Rfl:    cholecalciferol (VITAMIN D) 25 MCG (1000 UNIT) tablet, Take 1,000 Units by mouth every morning., Disp: , Rfl:    DULoxetine  (CYMBALTA ) 60 MG capsule, Take 1 capsule (60 mg total) by mouth every evening., Disp: 90 capsule, Rfl: 2   EPINEPHRINE  0.3 mg/0.3 mL IJ SOAJ injection, INJECT 0.3 MG INTO THE MUSCLE SEE ADMIN INSTRUCTIONS FOR ALLERGIC REACTION., Disp: 2 each, Rfl: 10   esomeprazole  (NEXIUM ) 20 MG capsule, TAKE ONE (1) CAPSULE BY MOUTH TWICE DAILY BEFORE A MEAL, Disp: 60 capsule, Rfl: 10   fluorometholone (FML) 0.1 % ophthalmic suspension, Place 1 drop into both eyes daily as needed (dry eyes/irritation)., Disp: , Rfl:    levothyroxine  (SYNTHROID ) 112 MCG tablet, TAKE 1 TABLET BY MOUTH EVERY EVENING, Disp: 30 tablet, Rfl: 10   losartan (COZAAR) 25 MG tablet, Take 25 mg by mouth daily., Disp: , Rfl:    metFORMIN  (GLUCOPHAGE -XR) 500 MG 24 hr tablet, Take 1 tablet (500 mg total) by mouth every evening., Disp: 90 tablet, Rfl: 3   Multiple Vitamin (MULTIVITAMIN WITH MINERALS) TABS tablet, Take 1 tablet by mouth every morning. One a Day for Men over 50, Disp: , Rfl:    nitroGLYCERIN  (NITROSTAT ) 0.4 MG SL tablet, DISSOLVE 1 TABLET UNDER THE  TONGUE EVERY 5 MINUTES AS NEEDED FOR CHEST PAIN. DO NOT EXCEED A TOTAL OF 3 DOSES IN 15 MINUTES., Disp: 25 tablet, Rfl: 11   Probiotic Product (PROBIOTIC PO), Take 1 capsule by mouth every morning., Disp: , Rfl:    propranolol  (INDERAL ) 60 MG tablet, TAKE ONE (1) TABLET BY MOUTH TWICE DAILY, Disp: 180 tablet, Rfl: 3   testosterone  cypionate (DEPOTESTOSTERONE CYPIONATE) 200 MG/ML injection, INJECT 1 ML (200 MG) INTO THE MUSCLE EVERY 14 DAYS, Disp: 7 mL, Rfl: 0   VEVYE 0.1 % SOLN, Apply 1 drop to eye 2 (two) times daily., Disp: , Rfl:    zinc gluconate 50 MG tablet, Take 50 mg by mouth  every morning., Disp: , Rfl:    Objective:     BP 112/70 (BP Location: Right Arm, Patient Position: Sitting, Cuff Size: Normal)   Pulse (!) 58   Temp (!) 97.1 F (36.2 C) (Temporal)   Ht 5' 11 (1.803 m)   Wt 217 lb (98.4 kg)   SpO2 95%   BMI 30.27 kg/m    Physical Exam Constitutional:      General: He is not in acute distress.    Appearance: Normal appearance. He is not ill-appearing, toxic-appearing or diaphoretic.  HENT:     Head: Normocephalic and atraumatic.     Right Ear: Tympanic membrane, ear canal and external ear normal.     Left Ear: Tympanic membrane, ear canal and external ear normal.     Mouth/Throat:     Mouth: Mucous membranes are moist.     Pharynx: Oropharynx is clear. No oropharyngeal exudate or posterior oropharyngeal erythema.  Eyes:     General: No scleral icterus.       Right eye: No discharge.        Left eye: No discharge.     Extraocular Movements: Extraocular movements intact.     Conjunctiva/sclera: Conjunctivae normal.  Cardiovascular:     Rate and Rhythm: Normal rate and regular rhythm.  Pulmonary:     Effort: Pulmonary effort is normal. No respiratory distress.     Breath sounds: Normal breath sounds. No wheezing or rales.  Abdominal:     General: Bowel sounds are normal.  Musculoskeletal:     Cervical back: No rigidity or tenderness.  Lymphadenopathy:     Cervical: No cervical adenopathy.  Skin:    General: Skin is warm and dry.  Neurological:     Mental Status: He is alert and oriented to person, place, and time.  Psychiatric:        Mood and Affect: Mood normal.        Behavior: Behavior normal.      No results found for any visits on 01/01/24.    The ASCVD Risk score (Arnett DK, et al., 2019) failed to calculate for the following reasons:   Risk score cannot be calculated because patient has a medical history suggesting prior/existing ASCVD    Assessment & Plan:   Androgen deficiency -     CBC  Prediabetes -      Comprehensive metabolic panel with GFR -     Hemoglobin A1c  Acquired hypothyroidism -     TSH  CAD S/P percutaneous coronary angioplasty -     Comprehensive metabolic panel with GFR -     Lipid panel  B12 deficiency -     Vitamin B12  History of gout -     Uric acid    Return in about 6 months (around 07/03/2024),  or if symptoms worsen or fail to improve.  Adjust meds made of above pending results of labs.  Elsie Sim Lent, MD

## 2024-01-04 ENCOUNTER — Ambulatory Visit: Payer: Self-pay | Admitting: Family Medicine

## 2024-01-04 DIAGNOSIS — R7303 Prediabetes: Secondary | ICD-10-CM

## 2024-01-04 MED ORDER — METFORMIN HCL ER 500 MG PO TB24
500.0000 mg | ORAL_TABLET | Freq: Two times a day (BID) | ORAL | 1 refills | Status: DC
Start: 2024-01-04 — End: 2024-01-05

## 2024-01-04 NOTE — Addendum Note (Signed)
 Addended by: BERNETA ELSIE LABOR on: 01/04/2024 08:01 AM   Modules accepted: Orders

## 2024-01-12 MED ORDER — METFORMIN HCL ER 500 MG PO TB24: 500.0000 mg | ORAL_TABLET | Freq: Two times a day (BID) | ORAL | 1 refills | Status: AC

## 2024-01-15 ENCOUNTER — Ambulatory Visit: Admitting: Podiatry

## 2024-01-15 ENCOUNTER — Encounter: Payer: Self-pay | Admitting: Podiatry

## 2024-01-15 DIAGNOSIS — N189 Chronic kidney disease, unspecified: Secondary | ICD-10-CM | POA: Diagnosis not present

## 2024-01-15 DIAGNOSIS — M79675 Pain in left toe(s): Secondary | ICD-10-CM | POA: Diagnosis not present

## 2024-01-15 DIAGNOSIS — M79674 Pain in right toe(s): Secondary | ICD-10-CM | POA: Diagnosis not present

## 2024-01-15 DIAGNOSIS — B351 Tinea unguium: Secondary | ICD-10-CM

## 2024-01-15 NOTE — Progress Notes (Signed)
This patient presents to the office with chief complaint of long thick painful nails.  Patient says the nails are painful walking and wearing shoes.  This patient is unable to self treat.  This patient is unable to trim his nails since he is unable to reach his nails.  he presents to the office for preventative foot care services.  General Appearance  Alert, conversant and in no acute stress.  Vascular  Dorsalis pedis and posterior tibial  pulses are palpable  bilaterally.  Capillary return is within normal limits  bilaterally. Temperature is within normal limits  bilaterally.  Neurologic  Senn-Weinstein monofilament wire test within normal limits  bilaterally. Muscle power within normal limits bilaterally.  Nails Thick disfigured discolored nails with subungual debris  from hallux to fifth toes bilaterally. No evidence of bacterial infection or drainage bilaterally.  Orthopedic  No limitations of motion  feet .  No crepitus or effusions noted.  No bony pathology or digital deformities noted.  Skin  normotropic skin with no porokeratosis noted bilaterally.  No signs of infections or ulcers noted.     Onychomycosis  Nails  B/L.  Pain in right toes  Pain in left toes  Debridement of nails both feet followed trimming the nails with dremel tool.    RTC  10 weeks    Gardiner Barefoot DPM

## 2024-01-15 NOTE — Progress Notes (Signed)
 Office Visit Note  Patient: Dennis Moon             Date of Birth: 1945/11/10           MRN: [id number]             PCP: Berneta Elsie Sayre, MD Referring: Berneta Elsie Sayre,* Visit Date: 01/29/2024 Occupation: @GUAROCC @  Subjective:  Dry mouth, dry eyes, joint pain  History of Present Illness: Dennis Moon is a 78 y.o. male with Sjogren's, cutaneous lupus, gout, osteoarthritis and degenerative disc disease.  He returns today after his last visit in March 2025.  He states that he continues to use eyedrops and some over-the-counter products for dry mouth.  His symptoms have manageable.  He denies any shortness of breath or palpitations.  He has not had any gout flares.  He has been taking allopurinol  300 mg on a regular basis.  He continues to have stiffness in his hands and lower back.  He has left frozen shoulder and will require surgery in the future.  Raynauds currently not very symptomatic.  He has not had any rash from subcutaneous lupus.  He has been using sunscreen.    Activities of Daily Living:  Patient reports morning stiffness for all day. Patient Denies nocturnal pain.  Difficulty dressing/grooming: Denies Difficulty climbing stairs: Denies Difficulty getting out of chair: Reports Difficulty using hands for taps, buttons, cutlery, and/or writing: Denies  Review of Systems  Constitutional:  Positive for fatigue.  HENT:  Positive for mouth dryness. Negative for mouth sores.   Eyes:  Positive for dryness.  Respiratory:  Negative for shortness of breath.   Cardiovascular:  Negative for chest pain and palpitations.  Gastrointestinal:  Negative for blood in stool, constipation and diarrhea.  Endocrine: Negative for increased urination.  Genitourinary:  Negative for involuntary urination.  Musculoskeletal:  Positive for joint pain, gait problem, joint pain, myalgias, morning stiffness, muscle tenderness and myalgias. Negative for joint swelling and muscle  weakness.  Skin:  Positive for color change. Negative for rash and sensitivity to sunlight.  Allergic/Immunologic: Negative for susceptible to infections.  Neurological:  Positive for numbness. Negative for dizziness and headaches.  Hematological:  Negative for swollen glands.  Psychiatric/Behavioral:  Negative for depressed mood and sleep disturbance. The patient is not nervous/anxious.     PMFS History:  Patient Active Problem List   Diagnosis Date Noted   Diarrhea 09/07/2023   Acute pain of left shoulder 07/08/2022   Cough 07/07/2022   Need for influenza vaccination 02/07/2022   Herpes labialis 02/07/2022   Mild reactive airways disease 07/26/2021   Hospital discharge follow-up 04/26/2021   Prediabetes 04/26/2021   Unstable angina (HCC) 03/29/2021   Chest pain 03/29/2021   Gastroesophageal reflux disease 09/07/2020   Allergy to honey bee venom 09/07/2020   History of gout 12/29/2019   Elevated cholesterol 12/29/2019   Screening for prostate cancer 12/29/2019   Nonintractable headache 06/24/2019   Androgen deficiency 11/17/2017   Hypothyroidism 11/17/2017   Cyst of tonsil 06/11/2017   Obesity (BMI 30.0-34.9) 03/12/2017   CAD S/P percutaneous coronary angioplasty 12/13/2016   NSTEMI (non-ST elevated myocardial infarction) (HCC) 12/11/2016   Gout of multiple sites 08/26/2016   Chronic renal impairment 08/26/2016   Chronic kidney disease 08/26/2016   Sjogren's syndrome (HCC) 04/23/2016   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   Idiopathic chronic gout, unspecified site, without tophus (tophi) 04/23/2016   Primary osteoarthritis of both hands 04/23/2016   DDD (degenerative disc disease), lumbar 04/23/2016  Kidney disease 04/23/2016    Past Medical History:  Diagnosis Date   CAD S/P percutaneous coronary angioplasty 12/12/2016   a. 11/2016: NSTEMI with DES to LAD and DES to LCx; b. MV low risk 2021; c. NSTEMI 11/22, LHC patent stents to LAD/LCx, 40% mLAD, 20% mRCA, stable, med mgmt.  Echo 11/22 EF 60-65%, GIDD, mild LAE.   CKD (chronic kidney disease), stage III (HCC)    GERD (gastroesophageal reflux disease)    Gout    History of Clostridioides difficile infection 2024   Hyperlipidemia LDL goal <70    Hypertension    Hypothyroidism    Migraine    Open fracture dislocation of interphalangeal joint of left thumb 10/14/2022   Osteoarthritis    Rotator cuff tear    left   Sjogren's disease (HCC)    Systemic lupus erythematosus (HCC)     Family History  Problem Relation Age of Onset   Diabetes Mother    Hypertension Mother    Hypertension Father    Healthy Son    Past Surgical History:  Procedure Laterality Date   CATARACT EXTRACTION, BILATERAL Bilateral 2017   COLONOSCOPY  2021   CORONARY STENT INTERVENTION N/A 12/12/2016   Procedure: Coronary Stent Intervention;  Surgeon: Swaziland, Peter M, MD;  Location: [facility] INVASIVE CV LAB;  Service: Cardiovascular;  Laterality: N/A;   HERNIA REPAIR     LEFT HEART CATH AND CORONARY ANGIOGRAPHY N/A 12/12/2016   Procedure: Left Heart Cath and Coronary Angiography;  Surgeon: Swaziland, Peter M, MD;  Location: [facility] INVASIVE CV LAB;  Service: Cardiovascular;  Laterality: N/A;   LEFT HEART CATH AND CORONARY ANGIOGRAPHY N/A 03/29/2021   Procedure: LEFT HEART CATH AND CORONARY ANGIOGRAPHY;  Surgeon: Verlin Lonni BIRCH, MD;  Location: [facility] INVASIVE CV LAB;  Service: Cardiovascular;  Laterality: N/A;   THYROID  SURGERY  1997   Social History   Social History Narrative   Not on file   Immunization History  Administered Date(s) Administered   INFLUENZA, HIGH DOSE SEASONAL PF 02/07/2022   Influenza Whole 02/25/2021   Influenza-Unspecified 01/28/2013, 02/16/2014, 02/24/2018, 02/04/2019, 02/16/2020   PFIZER(Purple Top)SARS-COV-2 Vaccination 06/17/2019, 07/08/2019, 02/16/2020   Pneumococcal Conjugate-13 10/21/2013   Pneumococcal Polysaccharide-23 08/02/2008, 03/14/2016   Tdap 07/30/2007, 07/18/2019   Zoster Recombinant(Shingrix)  11/12/2017   Zoster, Live 08/28/2006     Objective: Vital Signs: BP 122/80   Pulse (!) 111   Temp 98.6 F (37 C)   Resp 14   Ht 6' (1.829 m)   Wt 213 lb 3.2 oz (96.7 kg)   BMI 28.92 kg/m    Physical Exam Vitals and nursing note reviewed.  Constitutional:      Appearance: He is well-developed.  HENT:     Head: Normocephalic and atraumatic.  Eyes:     Conjunctiva/sclera: Conjunctivae normal.     Pupils: Pupils are equal, round, and reactive to light.  Cardiovascular:     Rate and Rhythm: Normal rate and regular rhythm.     Heart sounds: Normal heart sounds.  Pulmonary:     Effort: Pulmonary effort is normal.     Breath sounds: Normal breath sounds.  Abdominal:     General: Bowel sounds are normal.     Palpations: Abdomen is soft.  Musculoskeletal:     Cervical back: Normal range of motion and neck supple.  Skin:    General: Skin is warm and dry.     Capillary Refill: Capillary refill takes less than 2 seconds.  Neurological:     Mental  Status: He is alert and oriented to person, place, and time.  Psychiatric:        Behavior: Behavior normal.      Musculoskeletal Exam: He had limited lateral rotation of the cervical spine without discomfort.  Thoracic kyphosis without tenderness was noted.  He had no tenderness over lumbar spine.  Left shoulder joint abduction, internal rotation and forward flexion was limited.  Right shoulder joint was in full range of motion.  Elbow joints, wrist joints, MCPs PIPs and DIPs with good range of motion.  Bilateral PIP and DIP thickening was noted.  PIP joints and knee joints with good range of motion without any warmth swelling or effusion.  There was no tenderness over ankles or MTPs.  CDAI Exam: CDAI Score: -- Patient Global: --; Provider Global: -- Swollen: --; Tender: -- Joint Exam 01/29/2024   No joint exam has been documented for this visit   There is currently no information documented on the homunculus. Go to the  Rheumatology activity and complete the homunculus joint exam.  Investigation: No additional findings.  Imaging: No results found.  Recent Labs: Lab Results  Component Value Date   WBC 6.4 01/01/2024   HGB 16.1 01/01/2024   PLT 173.0 01/01/2024   NA 139 01/01/2024   K 4.6 01/01/2024   CL 100 01/01/2024   CO2 31 01/01/2024   GLUCOSE 102 (H) 01/01/2024   BUN 27 (H) 01/01/2024   CREATININE 1.22 01/01/2024   BILITOT 0.6 01/01/2024   ALKPHOS 92 01/01/2024   AST 32 01/01/2024   ALT 41 01/01/2024   PROT 7.2 01/01/2024   ALBUMIN 4.0 01/01/2024   CALCIUM  8.9 01/01/2024   GFRAA 59 (L) 07/01/2019    Speciality Comments: PLQ eye exam normal on 05/07/18  @ [facility].  Procedures:  No procedures performed Allergies: Bee venom   Assessment / Plan:     Visit Diagnoses: Sjogren's syndrome with other organ involvement (HCC) - Positive ANA, positive SSA, positive SSB, sicca symptoms: Symptoms are manageable with over-the-counter products.  Labs obtained on July 24, 2023 ANA was positive at 1: 1280 NS, SSA antibody was positive, urine protein creatinine ratio was elevated at 315, complements normal, sed rate normal, double-stranded DNA negative.  Patient had recent labs including CBC and CMP which were normal.  Hemoglobin A1c was elevated at 6.6.  I would obtain labs prior to his next visit.  Subacute cutaneous lupus erythematosus - Plaquenil  was discontinued in July 2020 after abnormal eye exam.  Patient has not had any flares.  Raynaud's disease without gangrene -currently not symptomatic.  He gets intermittent symptoms.  Idiopathic chronic gout of multiple sites without tophus -he has not had any flares of gout.  On allopurinol  300 mg p.o. daily.  Uric acid 6.3 on January 01, 2024.  Dietary modifications were discussed.  Chronic left shoulder pain-he continues to have limited range of motion of his left shoulder joint with about 30 degree abduction and forward flexion.  He plans  surgery in the future.  Primary osteoarthritis of both hands-bilateral PIP and DIP thickening with no synovitis was noted.  Spondylosis of lumbar spine-he has intermittent discomfort in the lumbar spine.  Core strengthening exercises were demonstrated in the office.  Osteopenia of multiple sites - August 14, 2023 BMD measured at Left Forearm Radius 33% is 0.814 g/cm2 with a T-score of -1.8 is considered osteopenic.  He takes calcium  and vitamin D.  DEXA scan results were reviewed.  Muscle strength exercise were discussed.  Degeneration of intervertebral disc of lumbar region without discogenic back pain or lower extremity pain  CAD S/P percutaneous coronary angioplasty  History of non-ST elevation myocardial infarction (NSTEMI)  History of Clostridioides difficile infection - July 2024.  Orders: Orders Placed This Encounter  Procedures   Protein / creatinine ratio, urine   CBC with Differential/Platelet   Comprehensive metabolic panel with GFR   ANA   Anti-DNA antibody, double-stranded   C3 and C4   Sedimentation rate   Rheumatoid factor   Serum protein electrophoresis with reflex   Uric acid   No orders of the defined types were placed in this encounter.    Follow-Up Instructions: Return in about 6 months (around 07/28/2024) for Sjogren's, Osteoarthritis .   Maya Nash, MD  Note - This record has been created using Animal nutritionist.  Chart creation errors have been sought, but may not always  have been located. Such creation errors do not reflect on  the standard of medical care.

## 2024-01-25 ENCOUNTER — Other Ambulatory Visit: Payer: Self-pay | Admitting: Family Medicine

## 2024-01-25 ENCOUNTER — Other Ambulatory Visit: Payer: Self-pay | Admitting: Cardiology

## 2024-01-25 DIAGNOSIS — G8929 Other chronic pain: Secondary | ICD-10-CM

## 2024-01-26 ENCOUNTER — Other Ambulatory Visit: Payer: Self-pay | Admitting: Family Medicine

## 2024-01-26 DIAGNOSIS — G8929 Other chronic pain: Secondary | ICD-10-CM

## 2024-01-29 ENCOUNTER — Encounter: Payer: Self-pay | Admitting: Rheumatology

## 2024-01-29 ENCOUNTER — Ambulatory Visit: Attending: Rheumatology | Admitting: Rheumatology

## 2024-01-29 VITALS — BP 122/80 | HR 111 | Temp 98.6°F | Resp 14 | Ht 72.0 in | Wt 213.2 lb

## 2024-01-29 DIAGNOSIS — L931 Subacute cutaneous lupus erythematosus: Secondary | ICD-10-CM

## 2024-01-29 DIAGNOSIS — M25512 Pain in left shoulder: Secondary | ICD-10-CM

## 2024-01-29 DIAGNOSIS — I252 Old myocardial infarction: Secondary | ICD-10-CM | POA: Diagnosis not present

## 2024-01-29 DIAGNOSIS — M19042 Primary osteoarthritis, left hand: Secondary | ICD-10-CM

## 2024-01-29 DIAGNOSIS — M1A09X0 Idiopathic chronic gout, multiple sites, without tophus (tophi): Secondary | ICD-10-CM | POA: Diagnosis not present

## 2024-01-29 DIAGNOSIS — M3509 Sicca syndrome with other organ involvement: Secondary | ICD-10-CM | POA: Diagnosis not present

## 2024-01-29 DIAGNOSIS — G8929 Other chronic pain: Secondary | ICD-10-CM

## 2024-01-29 DIAGNOSIS — I2510 Atherosclerotic heart disease of native coronary artery without angina pectoris: Secondary | ICD-10-CM

## 2024-01-29 DIAGNOSIS — M47816 Spondylosis without myelopathy or radiculopathy, lumbar region: Secondary | ICD-10-CM | POA: Diagnosis not present

## 2024-01-29 DIAGNOSIS — M8589 Other specified disorders of bone density and structure, multiple sites: Secondary | ICD-10-CM

## 2024-01-29 DIAGNOSIS — Z8619 Personal history of other infectious and parasitic diseases: Secondary | ICD-10-CM

## 2024-01-29 DIAGNOSIS — Z9861 Coronary angioplasty status: Secondary | ICD-10-CM

## 2024-01-29 DIAGNOSIS — I7300 Raynaud's syndrome without gangrene: Secondary | ICD-10-CM | POA: Diagnosis not present

## 2024-01-29 DIAGNOSIS — M19041 Primary osteoarthritis, right hand: Secondary | ICD-10-CM

## 2024-01-29 DIAGNOSIS — M51369 Other intervertebral disc degeneration, lumbar region without mention of lumbar back pain or lower extremity pain: Secondary | ICD-10-CM

## 2024-01-29 NOTE — Patient Instructions (Signed)
 Standing Labs We placed an order today for your standing lab work.   Please have your standing labs drawn in 6 months (1 week prior to your next visit)  Please have your labs drawn 2 weeks prior to your appointment so that the provider can discuss your lab results at your appointment, if possible.  Please note that you may see your imaging and lab results in MyChart before we have reviewed them. We will contact you once all results are reviewed. Please allow our office up to 72 hours to thoroughly review all of the results before contacting the office for clarification of your results.  WALK-IN LAB HOURS  Monday through Thursday from 8:00 am -12:30 pm and 1:00 pm-4:30 pm and Friday from 8:00 am-12:00 pm.  Patients with office visits requiring labs will be seen before walk-in labs.  You may encounter longer than normal wait times. Please allow additional time. Wait times may be shorter on  Monday and Thursday afternoons.  We do not book appointments for walk-in labs. We appreciate your patience and understanding with our staff.   Labs are drawn by [facility]. Please bring your co-pay at the time of your lab draw.  You may receive a bill from [facility] for your lab work.  Please note if you are on Hydroxychloroquine  and and an order has been placed for a Hydroxychloroquine  level,  you will need to have it drawn 4 hours or more after your last dose.  If you wish to have your labs drawn at another location, please call the office 24 hours in advance so we can fax the orders.  The office is located at [address], [address], [place], [place] [address]   If you have any questions regarding directions or hours of operation,  please call [phone].   As a reminder, please drink plenty of water prior to coming for your lab work. Thanks!

## 2024-02-23 ENCOUNTER — Other Ambulatory Visit: Payer: Self-pay | Admitting: Rheumatology

## 2024-02-24 NOTE — Telephone Encounter (Signed)
 Last Fill: 12/25/2023  Labs: 01/01/2024 Glucose 102, BUN 27, GFR 56.38, Uric Acid 6.3  Next Visit: 08/05/2024  Last Visit: 01/29/2024  DX: Idiopathic chronic gout of multiple sites without tophus   Current Dose per office note 01/29/2024: allopurinol  300 mg p.o. daily   Okay to refill Allopurinol ?

## 2024-03-09 ENCOUNTER — Encounter: Payer: Self-pay | Admitting: Family Medicine

## 2024-03-11 ENCOUNTER — Encounter: Payer: Self-pay | Admitting: Cardiology

## 2024-03-11 ENCOUNTER — Other Ambulatory Visit: Payer: Self-pay

## 2024-03-11 MED ORDER — ATORVASTATIN CALCIUM 80 MG PO TABS: 80.0000 mg | ORAL_TABLET | Freq: Every day | ORAL | 6 refills | Status: DC

## 2024-03-14 NOTE — Progress Notes (Unsigned)
   03/14/2024  Patient ID: Dennis Moon, male   DOB: 11-Dec-1945, 78 y.o.   MRN: [id number]  This patient is appearing on a report for being at risk of failing the adherence measure for hypertension (ACEi/ARB) medications this calendar year.   Medication: Losartan 25 mg tablets Last fill date: 11/03/23 for 90 day supply  MyChart message sent to patient.  Silviano Neuser C. Emmie Frakes [facility] PharmD Candidate Class of [phone]

## 2024-03-17 NOTE — Progress Notes (Unsigned)
   03/17/2024  Patient ID: Dennis Moon, male   DOB: Dec 09, 1945, 78 y.o.   MRN: [id number]  This patient is appearing on a report for being at risk of failing the adherence measure for cholesterol (statin) medications this calendar year.   Medication: atorvastatin  80 mg tablets Last fill date: 03/11/24 for 15 day supply  Insurance report was not up to date. No action needed at this time.   Berley Gambrell C. Dhana Totton [facility] PharmD Candidate Class of [phone]

## 2024-03-18 MED ORDER — LOSARTAN POTASSIUM 25 MG PO TABS: 25.0000 mg | ORAL_TABLET | Freq: Every day | ORAL | 1 refills | Status: AC

## 2024-03-18 NOTE — Progress Notes (Signed)
   03/18/2024  Patient ID: Dennis Moon, male   DOB: 01-03-1946, 78 y.o.   MRN: [id number]  This patient is appearing on a report for being at risk of failing the adherence measure for hypertension (ACEi/ARB) medications this calendar year.   Medication: losartan 25mg  daily  Last fill date: 11/03/23 for 90 day supply  MyChart message sent to patient.  Patient responded requesting refill to be sent to [facility] at [facility].  Order pending for PCP to sign if in agreement for a 90 day supply with 1 additional refill.  Patient was last seen by Dr. Berneta 8/15 and has a follow-up scheduled for 07/01/24.  Channing DELENA Mealing, PharmD, DPLA

## 2024-03-23 ENCOUNTER — Other Ambulatory Visit: Payer: Self-pay | Admitting: Family Medicine

## 2024-03-23 DIAGNOSIS — E291 Testicular hypofunction: Secondary | ICD-10-CM

## 2024-03-24 ENCOUNTER — Other Ambulatory Visit: Payer: Self-pay | Admitting: Cardiology

## 2024-03-24 NOTE — Telephone Encounter (Signed)
 Requesting: TESTOSTER CYP 200MG  SDV 200 Solution  Last Visit: 01/01/2024 Next Visit: 07/01/2024 Last Refill: 12/25/2023  Please Advise

## 2024-03-25 ENCOUNTER — Other Ambulatory Visit: Payer: Self-pay

## 2024-03-25 ENCOUNTER — Ambulatory Visit: Admitting: Podiatry

## 2024-03-25 DIAGNOSIS — E039 Hypothyroidism, unspecified: Secondary | ICD-10-CM

## 2024-03-25 MED ORDER — LEVOTHYROXINE SODIUM 112 MCG PO TABS: 112.0000 ug | ORAL_TABLET | Freq: Every evening | ORAL | 10 refills | Status: AC

## 2024-04-01 ENCOUNTER — Ambulatory Visit: Admitting: Podiatry

## 2024-04-04 ENCOUNTER — Other Ambulatory Visit: Payer: Self-pay

## 2024-04-04 DIAGNOSIS — B001 Herpesviral vesicular dermatitis: Secondary | ICD-10-CM

## 2024-04-04 MED ORDER — ACYCLOVIR 5 % EX OINT: 1.0000 | TOPICAL_OINTMENT | CUTANEOUS | 5 refills | Status: AC

## 2024-04-08 ENCOUNTER — Ambulatory Visit: Admitting: Podiatry

## 2024-04-08 ENCOUNTER — Encounter: Payer: Self-pay | Admitting: Podiatry

## 2024-04-08 ENCOUNTER — Other Ambulatory Visit (INDEPENDENT_AMBULATORY_CARE_PROVIDER_SITE_OTHER)

## 2024-04-08 DIAGNOSIS — M79674 Pain in right toe(s): Secondary | ICD-10-CM

## 2024-04-08 DIAGNOSIS — M79675 Pain in left toe(s): Secondary | ICD-10-CM

## 2024-04-08 DIAGNOSIS — B351 Tinea unguium: Secondary | ICD-10-CM | POA: Diagnosis not present

## 2024-04-08 DIAGNOSIS — R7303 Prediabetes: Secondary | ICD-10-CM | POA: Diagnosis not present

## 2024-04-08 DIAGNOSIS — M3509 Sicca syndrome with other organ involvement: Secondary | ICD-10-CM

## 2024-04-08 DIAGNOSIS — M1A09X0 Idiopathic chronic gout, multiple sites, without tophus (tophi): Secondary | ICD-10-CM

## 2024-04-08 DIAGNOSIS — E785 Hyperlipidemia, unspecified: Secondary | ICD-10-CM

## 2024-04-08 DIAGNOSIS — N189 Chronic kidney disease, unspecified: Secondary | ICD-10-CM

## 2024-04-08 LAB — HEMOGLOBIN A1C: Hgb A1c MFr Bld: 5.5 % (ref 4.6–6.5)

## 2024-04-08 NOTE — Progress Notes (Signed)
This patient presents to the office with chief complaint of long thick painful nails.  Patient says the nails are painful walking and wearing shoes.  This patient is unable to self treat.  This patient is unable to trim his nails since he is unable to reach his nails.  he presents to the office for preventative foot care services.  General Appearance  Alert, conversant and in no acute stress.  Vascular  Dorsalis pedis and posterior tibial  pulses are palpable  bilaterally.  Capillary return is within normal limits  bilaterally. Temperature is within normal limits  bilaterally.  Neurologic  Senn-Weinstein monofilament wire test within normal limits  bilaterally. Muscle power within normal limits bilaterally.  Nails Thick disfigured discolored nails with subungual debris  from hallux to fifth toes bilaterally. No evidence of bacterial infection or drainage bilaterally.  Orthopedic  No limitations of motion  feet .  No crepitus or effusions noted.  No bony pathology or digital deformities noted.  Skin  normotropic skin with no porokeratosis noted bilaterally.  No signs of infections or ulcers noted.     Onychomycosis  Nails  B/L.  Pain in right toes  Pain in left toes  Debridement of nails both feet followed trimming the nails with dremel tool.    RTC  10 weeks    Gardiner Barefoot DPM

## 2024-04-08 NOTE — Addendum Note (Signed)
 Addended by: ALTO PARODY D on: 04/08/2024 08:47 AM   Modules accepted: Orders

## 2024-04-12 ENCOUNTER — Encounter: Payer: Self-pay | Admitting: Family Medicine

## 2024-04-12 DIAGNOSIS — E291 Testicular hypofunction: Secondary | ICD-10-CM

## 2024-04-19 ENCOUNTER — Telehealth: Payer: Self-pay

## 2024-04-19 NOTE — Telephone Encounter (Signed)
 Copied from CRM #[id number]. Topic: Clinical - Request for Lab/Test Order >> Apr 19, 2024  9:12 AM Laymon HERO wrote: Reason for CRM: Patient needing to get an order put in for Testosterone  leel check- Appt for 12/5

## 2024-04-19 NOTE — Telephone Encounter (Signed)
 Left VM that the lab order is already in the system.  Dm/cma

## 2024-04-19 NOTE — Progress Notes (Unsigned)
 Cardiology Clinic Note   Patient Name: Dennis Moon Date of Encounter: 04/22/2024  Primary Care Provider:  Berneta Elsie Sayre, MD Primary Cardiologist:  Peter Jordan, MD  Patient Profile    [name] 78 year old male presents to the clinic today for follow-up evaluation of his atrial tachycardia and CAD.  Past Medical History    Past Medical History:  Diagnosis Date   CAD S/P percutaneous coronary angioplasty 12/12/2016   a. 11/2016: NSTEMI with DES to LAD and DES to LCx; b. MV low risk 2021; c. NSTEMI 11/22, LHC patent stents to LAD/LCx, 40% mLAD, 20% mRCA, stable, med mgmt. Echo 11/22 EF 60-65%, GIDD, mild LAE.   CKD (chronic kidney disease), stage III (HCC)    GERD (gastroesophageal reflux disease)    Gout    History of Clostridioides difficile infection 2024   Hyperlipidemia LDL goal <70    Hypertension    Hypothyroidism    Migraine    Open fracture dislocation of interphalangeal joint of left thumb 10/14/2022   Osteoarthritis    Rotator cuff tear    left   Sjogren's disease    Systemic lupus erythematosus (HCC)    Past Surgical History:  Procedure Laterality Date   CATARACT EXTRACTION, BILATERAL Bilateral 2017   COLONOSCOPY  2021   CORONARY STENT INTERVENTION N/A 12/12/2016   Procedure: Coronary Stent Intervention;  Surgeon: Jordan, Peter M, MD;  Location: [facility] INVASIVE CV LAB;  Service: Cardiovascular;  Laterality: N/A;   HERNIA REPAIR     LEFT HEART CATH AND CORONARY ANGIOGRAPHY N/A 12/12/2016   Procedure: Left Heart Cath and Coronary Angiography;  Surgeon: Jordan, Peter M, MD;  Location: [facility] INVASIVE CV LAB;  Service: Cardiovascular;  Laterality: N/A;   LEFT HEART CATH AND CORONARY ANGIOGRAPHY N/A 03/29/2021   Procedure: LEFT HEART CATH AND CORONARY ANGIOGRAPHY;  Surgeon: Verlin Lonni BIRCH, MD;  Location: [facility] INVASIVE CV LAB;  Service: Cardiovascular;  Laterality: N/A;   THYROID  SURGERY  1997    Allergies  Allergies  Allergen Reactions    Bee Venom Swelling    Facial, hand and foot swelling    History of Present Illness    Dennis Moon has a PMH of HLD, HTN, CKD stage III G1 DD and atrial tachycardia.  His PMH also includes gout, lupus, Sjogren's, OA, and degenerative disc disease.  He was admitted to [facility] 7/18 for evaluation of chest discomfort.  His EKG showed no acute changes in his troponins.  However, cardiac troponins peaked at 0.40.  Cardiac catheterization was performed and showed two-vessel obstructive coronary disease with 90% proximal-LAD stenosis and 80% proximal circumflex stenosis.  PCI was performed.  And he received DES to his LAD and circumflex.  He was placed on aspirin  and Brilinta .  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  He had subsequent chest discomfort and nuclear stress testing was ordered.  Testing was normal.  It was felt that his symptoms were related to reflux.  He presented to [facility]/29/20[facility] with GERD symptoms and chest pain.  He was ruled in for NSTEMI.  He underwent repeat catheterization which showed patent stents of his LAD and circumflex.  He was noted to have 40% mid LAD and 20% mid RCA stenosis.  Medical management was recommended.  Echocardiogram at that time showed an EF of 60-65%, G1 DD and mild left atrial enlargement.  He was seen in follow-up by Dr. Jordan on 03/23/2023.  During that time he was  doing well.  He did report that his heart rate in the office on Tuesday and Thursday had been over 120.  His Kardia mobile device showed possible atrial fibrillation on 1 occasion.  Otherwise his rhythms were noted to be okay.  He woke up 1 morning with heartburn which was unrelieved by antacids but relieved by sublingual nitroglycerin  x 1.  He denied further episodes of chest discomfort.  He was generally doing well.  He presented to the clinic 04/03/23 for follow-up evaluation and stated he figured out that his pharmacy mail order service had not included his  beta-blocker medication.  Since being back on his propranolol  his heart rate had returned to normal.  He had been monitoring his blood pressure and heart rate at home.   He is a pleasant gentleman.  We discussed dentistry and the fluoride and [place] water sources.  I continued his current medication regimen and planned follow-up in 6 months.  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  He was fairly physically active.  He did note some limitations due to his low back pain and neck pain.  These were minimal.  He continued to use mail order pharmacy.  He  had no further issues with this.   He did report that he was diagnosed with skin lupus and was placed on Plaquenil .  This caused some toxicity and affected his retina.  He recognized this and discontinued the Plaquenil .  He was following with retinal specialist.  His vision had stabilized and somewhat improved since he initially noted his symptoms.  I planned to repeat his fasting lipids and LFTs in November. Follow-up in 6 months was planned.  He presents to the clinic today for follow-up evaluation and states he is doing well.  He continues to practice.  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  He has been staying physically active and also uses a recumbent bike.  We reviewed his previous catheterizations and his most recent lipid panel which shows a LDL cholesterol of 51 that was drawn on 01/01/2024.  His EKG today shows sinus rhythm with RBBB 74 bpm.  He has been very stable from a cardiac standpoint.  I will continue his current medication regimen, physical activity and plan follow-up in 9 to 12 months or sooner if needed.  Today he denies chest pain, shortness of breath, and lower extremity edema.   Home Medications    Prior to Admission medications   Medication Sig Start Date End Date Taking? Authorizing Provider  acyclovir  cream (ZOVIRAX ) 5 % Apply 1 Application topically  every 3 (three) hours. 02/07/22   Berneta Elsie Sayre, MD  albuterol  (VENTOLIN  HFA) 108 [phone] Base) MCG/ACT inhaler INHALE ONE (1) PUFF BY MOUTH INTO LUNGS EVERY 6 HOURS AS NEEDED FOR WHEEZING AND/OR FOR SHORTNESS OF BREATH *REFILL REQUEST* 01/23/23   Berneta Elsie Sayre, MD  allopurinol  (ZYLOPRIM ) 300 MG tablet TAKE 1 TABLET BY MOUTH EVERY EVENING 01/22/23   Rice, Lonni ORN, MD  amLODipine  (NORVASC ) 5 MG tablet Take 1 tablet (5 mg total) by mouth daily. 01/23/23   Berneta Elsie Sayre, MD  Ascorbic Acid (VITAMIN C) 1000 MG tablet Take 1,000 mg by mouth every morning.    [provider]  aspirin -acetaminophen -caffeine (EXCEDRIN EXTRA STRENGTH) 250-250-65 MG tablet Take 2 tablets by mouth 3 (three) times daily as needed for headache or migraine (pain).    [provider]  atorvastatin  (LIPITOR ) 80 MG tablet TAKE  1 TABLET(80 MG) BY MOUTH EVERY EVENING 01/26/23   Jordan, Peter M, MD  Calcium  Carb-Cholecalciferol (CALCIUM  600 + D PO) Take 1 tablet by mouth every morning.    [provider]  cholecalciferol (VITAMIN D) 25 MCG (1000 UNIT) tablet Take 1,000 Units by mouth every morning.    [provider]  clobetasol  cream (TEMOVATE ) 0.05 % Apply to affected area twice daily as needed for up to 2 weeks 02/07/22   Berneta Elsie Sayre, MD  CRANBERRY EXTRACT PO Take 4,200 mg by mouth 2 (two) times daily.    [provider]  DULoxetine  (CYMBALTA ) 60 MG capsule Take 1 capsule (60 mg total) by mouth every evening. 01/23/23   Berneta Elsie Sayre, MD  EPINEPHrine  0.3 mg/0.3 mL IJ SOAJ injection Inject 0.3 mg into the muscle See admin instructions. 02/12/23   Berneta Elsie Sayre, MD  esomeprazole  (NEXIUM ) 20 MG capsule Take 1 capsule (20 mg total) by mouth 2 (two) times daily before a meal. 01/23/23   Berneta Elsie Sayre, MD  fluorometholone (FML) 0.1 % ophthalmic suspension Place 1 drop into both eyes daily as needed (dry eyes/irritation). 05/14/20   [provider]  levocetirizine (XYZAL) 5 MG tablet Take 5 mg by mouth daily as needed (seasonal allergies).    [provider]  levothyroxine  (SYNTHROID ) 112 MCG tablet TAKE 1 TABLET BY MOUTH EVERY EVENING 02/27/23   Berneta Elsie Sayre, MD  metFORMIN  (GLUCOPHAGE -XR) 500 MG 24 hr tablet Take 1 tablet (500 mg total) by mouth every evening. 01/23/23   Berneta Elsie Sayre, MD  Multiple Vitamin (MULTIVITAMIN WITH MINERALS) TABS tablet Take 1 tablet by mouth every morning. One a Day for Men over 5    [provider]  nitroGLYCERIN  (NITROSTAT ) 0.4 MG SL tablet DISSOLVE 1 TABLET UNDER THE TONGUE EVERY 5 MINUTES AS NEEDED FOR CHEST PAIN. DO NOT EXCEED A TOTAL OF 3 DOSES IN 15 MINUTES. 09/26/22   Jordan, Peter M, MD  Polyethyl Glycol-Propyl Glycol (SYSTANE) 0.4-0.3 % SOLN Place 1 drop into both eyes 2 (two) times daily as needed (dry eyes).    [provider]  predniSONE  (DELTASONE ) 50 MG tablet Take 1 tablet daily for 5 days. 07/08/22   Sebastian Beverley NOVAK, MD  Probiotic Product (PROBIOTIC PO) Take 1 capsule by mouth every morning.    [provider]  propranolol  (INDERAL ) 60 MG tablet TAKE ONE TABLET BY MOUTH TWICE DAILY 01/23/23   Berneta Elsie Sayre, MD  testosterone  (ANDROGEL ) 50 MG/5GM (1%) GEL apply FIVE grams (ONE PACKET) onto THE SKIN TWICE DAILY 03/10/23   Berneta Elsie Sayre, MD  Ubrogepant  (UBRELVY ) 50 MG TABS Take one at first sign of migraine. May repeat once in 2 hours. Patient taking differently: Take 50 mg by mouth See admin instructions. Take one tablet (50 mg) by mouth at first sign of migraine; may repeat once in 2 hours if still needed 09/07/20   Berneta Elsie Sayre, MD  zinc gluconate 50 MG tablet Take 50 mg by mouth every morning.    [provider]    Family History    Family History  Problem Relation Age of Onset   Diabetes Mother    Hypertension Mother    Hypertension Father    Healthy Son    He indicated that his mother is  deceased. He indicated that his father is deceased. He indicated that his maternal grandmother is deceased. He indicated that his maternal grandfather is deceased. He indicated that his paternal grandmother is deceased.  He indicated that his paternal grandfather is deceased. He indicated that his son is alive.  Social History    Social History   Socioeconomic History   Marital status: Married    Spouse name: Not on file   Number of children: Not on file   Years of education: Not on file   Highest education level: Professional school degree (e.g., MD, DDS, DVM, JD)  Occupational History   Occupation: Dentist  Tobacco Use   Smoking status: Former    Current packs/day: 0.00    Average packs/day: 1 pack/day for 6.0 years (6.0 ttl pk-yrs)    Types: Cigarettes    Start date: 04/25/1960    Quit date: 04/25/1966    Years since quitting: 58.0    Passive exposure: Past   Smokeless tobacco: Never  Vaping Use   Vaping status: Never Used  Substance and Sexual Activity   Alcohol use: Yes    Alcohol/week: 7.0 standard drinks of alcohol    Types: 7 Glasses of wine per week   Drug use: Never   Sexual activity: Not on file  Other Topics Concern   Not on file  Social History Narrative   Not on file   Social Drivers of Health   Financial Resource Strain: Low Risk  (12/25/2023)   Overall Financial Resource Strain (CARDIA)    Difficulty of Paying Living Expenses: Not very hard  Food Insecurity: No Food Insecurity (12/25/2023)   Hunger Vital Sign    Worried About Running Out of Food in the Last Year: Never true    Ran Out of Food in the Last Year: Never true  Transportation Needs: No Transportation Needs (12/25/2023)   PRAPARE - Administrator, Civil Service (Medical): No    Lack of Transportation (Non-Medical): No  Physical Activity: Inactive (12/25/2023)   Exercise Vital Sign    Days of Exercise per Week: 0 days    Minutes of Exercise per Session: 0 min  Stress: No Stress Concern  Present (12/25/2023)   [employer] of Occupational Health - Occupational Stress Questionnaire    Feeling of Stress: Only a little  Social Connections: Socially Integrated (12/25/2023)   Social Connection and Isolation Panel    Frequency of Communication with Friends and Family: Twice a week    Frequency of Social Gatherings with Friends and Family: Once a week    Attends Religious Services: More than 4 times per year    Active Member of [employer] or Organizations: Yes    Attends Engineer, Structural: More than 4 times per year    Marital Status: Married  Catering Manager Violence: Not At Risk (12/25/2023)   Humiliation, Afraid, Rape, and Kick questionnaire    Fear of Current or Ex-Partner: No    Emotionally Abused: No    Physically Abused: No    Sexually Abused: No     Review of Systems    General:  No chills, fever, night sweats or weight changes.  Cardiovascular:  No chest pain, dyspnea on exertion, edema, orthopnea, palpitations, paroxysmal nocturnal dyspnea. Dermatological: No rash, lesions/masses Respiratory: No cough, dyspnea Urologic: No hematuria, dysuria Abdominal:   No nausea, vomiting, diarrhea, bright red blood per rectum, melena, or hematemesis Neurologic:  No visual changes, wkns, changes in mental status. All other systems reviewed and are otherwise negative except as noted above.  Physical Exam    VS:  BP 122/70   Pulse 74   Ht 6' (1.829 m)   Wt 212 lb (96.2  kg)   SpO2 98%   BMI 28.75 kg/m  , BMI Body mass index is 28.75 kg/m. GEN: Well nourished, well developed, in no acute distress. HEENT: normal. Neck: Supple, no JVD, carotid bruits, or masses. Cardiac: RRR, no murmurs, rubs, or gallops. No clubbing, cyanosis, edema.  Radials/DP/PT 2+ and equal bilaterally.  Respiratory:  Respirations regular and unlabored, clear to auscultation bilaterally. GI: Soft, nontender, nondistended, BS + x 4. MS: no deformity or atrophy. Skin: warm and dry, no  rash. Neuro:  Strength and sensation are intact. Psych: Normal affect.  Accessory Clinical Findings    Recent Labs: 01/01/2024: ALT 41; BUN 27; Creatinine, Ser 1.22; Hemoglobin 16.1; Platelets 173.0; Potassium 4.6; Sodium 139; TSH 2.04   Recent Lipid Panel    Component Value Date/Time   CHOL 114 01/01/2024 0940   CHOL 125 03/23/2023 1400   TRIG 109.0 01/01/2024 0940   HDL 40.70 01/01/2024 0940   HDL 47 03/23/2023 1400   CHOLHDL 3 01/01/2024 0940   VLDL 21.8 01/01/2024 0940   LDLCALC 51 01/01/2024 0940   LDLCALC 55 03/23/2023 1400         ECG personally reviewed by me today-EKG Interpretation Date/Time:  Friday April 22 2024 11:17:37 EST Ventricular Rate:  74 PR Interval:  180 QRS Duration:  140 QT Interval:  384 QTC Calculation: 426 R Axis:   14  Text Interpretation: Normal sinus rhythm Right bundle branch block When compared with ECG of 23-Mar-2023 13:22, Premature atrial complexes are no longer Present Vent. rate has decreased BY  39 BPM Confirmed by Emelia Hazy [phone]) on 04/22/2024 11:22:03 AM    Cardiac cath 03/29/21:   LEFT HEART CATH AND CORONARY ANGIOGRAPHY    Conclusion       Prox RCA lesion is 20% stenosed.   Mid LAD lesion is 40% stenosed.   Previously placed Ost Cx to Prox Cx stent (unknown type) is  widely patent.   Previously placed Ost LAD to Prox LAD stent (unknown type) is  widely patent.   Stable two vessel CAD Patent proximal LAD stent with mild mid LAD stenosis beyond the stent Patent proximal circumflex stent Mild plaque mid RCA.    Recommendations: Continue medical management of CAD. Echo in the am. Monitor on telemetry tonight.   Dominance: Right Intervention     Echo 03/30/21: IMPRESSIONS     1. Left ventricular ejection fraction, by estimation, is 60 to 65%. The  left ventricle has normal function. The left ventricle has no regional  wall motion abnormalities. There is mild left ventricular hypertrophy.  Left ventricular  diastolic parameters  are consistent with Grade I diastolic dysfunction (impaired relaxation).   2. Right ventricular systolic function is normal. The right ventricular  size is normal.   3. Left atrial size was mildly dilated.   4. The mitral valve is normal in structure. No evidence of mitral valve  regurgitation. No evidence of mitral stenosis.   5. The aortic valve is normal in structure. There is moderate  calcification of the aortic valve. There is mild thickening of the aortic  valve. Aortic valve regurgitation is not visualized. Aortic valve  sclerosis/calcification is present, without any  evidence of aortic stenosis.   6. The inferior vena cava is normal in size with greater than 50%  respiratory variability, suggesting right atrial pressure of 3 mmHg.        Assessment & Plan   1.Coronary artery disease-denies exertional chest discomfort.  He underwent cardiac catheterization 11/22 showed proximal  RCA 20%, mid LAD 40%, and patent LAD and circumflex stents.  His echocardiogram was reassuring. Continue amlodipine , atorvastatin , propranolol  Heart healthy low-sodium diet-reviewed Continue/maintain physical activity  Hyperlipidemia-LDL 55 on 03/23/2023. 01/01/2024: Cholesterol 114; HDL 40.70; LDL Cholesterol 51; Triglycerides 109.0; VLDL 21.8 High-fiber diet Maintain physical activity Continue atorvastatin    Atrial tachycardia-heart rate today 74 bpm.  Stable with beta-blocker therapy. Continue propranolol  Avoid triggers caffeine, chocolate, EtOH, dehydration etc.-again, reviewed Maintain physical activity  Diastolic dysfunction-stable.  He denies increased DOE or activity intolerance.  Euvolemic today.  His  Echocardiogram 11/22 showed stable EF and G1 DD. Heart healthy low-sodium diet Continue losartan  Monitor weights daily   Disposition: Follow-up with Dr. Jordan or me in  9-12 months or sooner if needed.   Josefa HERO. Porshia Blizzard NP-C     04/22/2024, 11:22 AM [facility] [address] Office [phone] Fax [phone]    I spent 14 minutes examining this patient, reviewing medications, and using patient centered shared decision making involving her cardiac care.   I spent greater than 20 minutes reviewing her past medical history,  medications, and prior cardiac tests.

## 2024-04-22 ENCOUNTER — Encounter: Payer: Self-pay | Admitting: General Practice

## 2024-04-22 ENCOUNTER — Other Ambulatory Visit

## 2024-04-22 ENCOUNTER — Telehealth: Payer: Self-pay | Admitting: *Deleted

## 2024-04-22 ENCOUNTER — Ambulatory Visit: Attending: General Practice | Admitting: General Practice

## 2024-04-22 VITALS — BP 122/70 | HR 74 | Ht 72.0 in | Wt 212.0 lb

## 2024-04-22 DIAGNOSIS — I7300 Raynaud's syndrome without gangrene: Secondary | ICD-10-CM

## 2024-04-22 DIAGNOSIS — R000 Tachycardia, unspecified: Secondary | ICD-10-CM | POA: Diagnosis not present

## 2024-04-22 DIAGNOSIS — E291 Testicular hypofunction: Secondary | ICD-10-CM

## 2024-04-22 DIAGNOSIS — I5189 Other ill-defined heart diseases: Secondary | ICD-10-CM

## 2024-04-22 DIAGNOSIS — I2510 Atherosclerotic heart disease of native coronary artery without angina pectoris: Secondary | ICD-10-CM | POA: Diagnosis not present

## 2024-04-22 DIAGNOSIS — M3509 Sicca syndrome with other organ involvement: Secondary | ICD-10-CM

## 2024-04-22 DIAGNOSIS — E785 Hyperlipidemia, unspecified: Secondary | ICD-10-CM

## 2024-04-22 DIAGNOSIS — Z79899 Other long term (current) drug therapy: Secondary | ICD-10-CM

## 2024-04-22 DIAGNOSIS — M1A09X0 Idiopathic chronic gout, multiple sites, without tophus (tophi): Secondary | ICD-10-CM

## 2024-04-22 NOTE — Telephone Encounter (Signed)
 Patient contacted the office to have lab orders released again so he may have his labs done next week at [facility]. Lab orders released for patient.

## 2024-04-22 NOTE — Patient Instructions (Signed)
 Medication Instructions:  Your physician recommends that you continue on your current medications as directed. Please refer to the Current Medication list given to you today.  *If you need a refill on your cardiac medications before your next appointment, please call your pharmacy*   Follow-Up: At [facility], you and your health needs are our priority.  As part of our continuing mission to provide you with exceptional heart care, our providers are all part of one team.  This team includes your primary Cardiologist (physician) and Advanced Practice Providers or APPs (Physician Assistants and Nurse Practitioners) who all work together to provide you with the care you need, when you need it.  Your next appointment:   9-12 month(s)  Provider:   Peter Jordan, MD or Josefa Beauvais, NP

## 2024-04-23 LAB — TESTOSTERONE TOTAL,FREE,BIO, MALES
Albumin: 3.9 g/dL (ref 3.6–5.1)
Sex Hormone Binding: 20 nmol/L — ABNORMAL LOW (ref 22–77)
Testosterone, Bioavailable: 451 ng/dL — ABNORMAL HIGH (ref 15.0–150.0)
Testosterone, Free: 251.2 pg/mL — ABNORMAL HIGH (ref 6.0–73.0)
Testosterone: 988 ng/dL — ABNORMAL HIGH (ref 250–827)

## 2024-04-23 LAB — LIPID PANEL
Chol/HDL Ratio: 2.9 ratio (ref 0.0–5.0)
Cholesterol, Total: 96 mg/dL — ABNORMAL LOW (ref 100–199)
HDL: 33 mg/dL — ABNORMAL LOW (ref >39)
LDL Chol Calc (NIH): 40 mg/dL (ref 0–99)
Triglycerides: 131 mg/dL (ref 0–149)
VLDL Cholesterol Cal: 23 mg/dL (ref 5–40)

## 2024-04-23 LAB — HEPATIC FUNCTION PANEL
ALT: 30 IU/L (ref 0–44)
AST: 33 IU/L (ref 0–40)
Albumin: 4.1 g/dL (ref 3.8–4.8)
Alkaline Phosphatase: 109 IU/L (ref 47–123)
Bilirubin Total: 0.5 mg/dL (ref 0.0–1.2)
Bilirubin, Direct: 0.15 mg/dL (ref 0.00–0.40)
Total Protein: 7.1 g/dL (ref 6.0–8.5)

## 2024-04-24 ENCOUNTER — Ambulatory Visit: Payer: Self-pay | Admitting: General Practice

## 2024-05-25 ENCOUNTER — Other Ambulatory Visit: Payer: Self-pay | Admitting: Rheumatology

## 2024-05-26 NOTE — Telephone Encounter (Signed)
 Last Fill: 02/24/2024  Labs: 01/01/2024 glucose 102, BUN 27, GFR 56.38 04/22/2024 hepatic function panel WNL   Next Visit: 08/05/2024  Last Visit: 01/29/2024  DX: Idiopathic chronic gout of multiple sites without tophus   Current Dose per office note on 01/29/2024: On allopurinol  300 mg p.o. daily.   Okay to refill Allopurinol ?

## 2024-05-27 ENCOUNTER — Encounter: Payer: Self-pay | Admitting: Family Medicine

## 2024-05-27 DIAGNOSIS — E291 Testicular hypofunction: Secondary | ICD-10-CM

## 2024-06-03 ENCOUNTER — Other Ambulatory Visit (INDEPENDENT_AMBULATORY_CARE_PROVIDER_SITE_OTHER)

## 2024-06-03 DIAGNOSIS — E291 Testicular hypofunction: Secondary | ICD-10-CM | POA: Diagnosis not present

## 2024-06-04 LAB — TESTOSTERONE TOTAL,FREE,BIO, MALES
Albumin: 4 g/dL (ref 3.6–5.1)
Sex Hormone Binding: 24 nmol/L (ref 22–77)
Testosterone, Bioavailable: 296.1 ng/dL — ABNORMAL HIGH (ref 15.0–150.0)
Testosterone, Free: 161 pg/mL — ABNORMAL HIGH (ref 6.0–73.0)
Testosterone: 792 ng/dL (ref 250–827)

## 2024-06-10 ENCOUNTER — Ambulatory Visit: Admitting: Podiatry

## 2024-06-10 ENCOUNTER — Encounter: Payer: Self-pay | Admitting: Podiatry

## 2024-06-10 DIAGNOSIS — B351 Tinea unguium: Secondary | ICD-10-CM | POA: Diagnosis not present

## 2024-06-10 DIAGNOSIS — N189 Chronic kidney disease, unspecified: Secondary | ICD-10-CM

## 2024-06-10 DIAGNOSIS — M79675 Pain in left toe(s): Secondary | ICD-10-CM

## 2024-06-10 DIAGNOSIS — M79674 Pain in right toe(s): Secondary | ICD-10-CM

## 2024-06-10 NOTE — Progress Notes (Signed)
This patient presents to the office with chief complaint of long thick painful nails.  Patient says the nails are painful walking and wearing shoes.  This patient is unable to self treat.  This patient is unable to trim his nails since he is unable to reach his nails.  he presents to the office for preventative foot care services.  General Appearance  Alert, conversant and in no acute stress.  Vascular  Dorsalis pedis and posterior tibial  pulses are palpable  bilaterally.  Capillary return is within normal limits  bilaterally. Temperature is within normal limits  bilaterally.  Neurologic  Senn-Weinstein monofilament wire test within normal limits  bilaterally. Muscle power within normal limits bilaterally.  Nails Thick disfigured discolored nails with subungual debris  from hallux to fifth toes bilaterally. No evidence of bacterial infection or drainage bilaterally.  Orthopedic  No limitations of motion  feet .  No crepitus or effusions noted.  No bony pathology or digital deformities noted.  Skin  normotropic skin with no porokeratosis noted bilaterally.  No signs of infections or ulcers noted.     Onychomycosis  Nails  B/L.  Pain in right toes  Pain in left toes  Debridement of nails both feet followed trimming the nails with dremel tool.    RTC  10 weeks    Gardiner Barefoot DPM

## 2024-07-01 ENCOUNTER — Ambulatory Visit: Admitting: Family Medicine

## 2024-08-05 ENCOUNTER — Ambulatory Visit: Admitting: Rheumatology

## 2024-08-19 ENCOUNTER — Ambulatory Visit: Admitting: Podiatry

## 2024-12-30 ENCOUNTER — Ambulatory Visit
